# Patient Record
Sex: Female | Born: 1945
Health system: Southern US, Community
[De-identification: ages and names within clinical notes are randomized; demographics above are authoritative.]

## PROBLEM LIST (undated history)

## (undated) DIAGNOSIS — J449 Chronic obstructive pulmonary disease, unspecified: Secondary | ICD-10-CM

## (undated) DIAGNOSIS — I219 Acute myocardial infarction, unspecified: Secondary | ICD-10-CM

## (undated) DIAGNOSIS — E119 Type 2 diabetes mellitus without complications: Secondary | ICD-10-CM

## (undated) DIAGNOSIS — K219 Gastro-esophageal reflux disease without esophagitis: Secondary | ICD-10-CM

## (undated) DIAGNOSIS — E785 Hyperlipidemia, unspecified: Secondary | ICD-10-CM

## (undated) DIAGNOSIS — T7840XA Allergy, unspecified, initial encounter: Secondary | ICD-10-CM

## (undated) DIAGNOSIS — I509 Heart failure, unspecified: Secondary | ICD-10-CM

## (undated) DIAGNOSIS — F32A Depression, unspecified: Secondary | ICD-10-CM

## (undated) DIAGNOSIS — J45909 Unspecified asthma, uncomplicated: Secondary | ICD-10-CM

## (undated) DIAGNOSIS — I2510 Atherosclerotic heart disease of native coronary artery without angina pectoris: Secondary | ICD-10-CM

## (undated) DIAGNOSIS — I10 Essential (primary) hypertension: Secondary | ICD-10-CM

## (undated) DIAGNOSIS — F329 Major depressive disorder, single episode, unspecified: Secondary | ICD-10-CM

## (undated) HISTORY — DX: Allergy, unspecified, initial encounter: T78.40XA

## (undated) HISTORY — DX: Unspecified asthma, uncomplicated: J45.909

## (undated) HISTORY — DX: Gastro-esophageal reflux disease without esophagitis: K21.9

## (undated) HISTORY — DX: Atherosclerotic heart disease of native coronary artery without angina pectoris: I25.10

## (undated) HISTORY — DX: Depression, unspecified: F32.A

## (undated) HISTORY — DX: Major depressive disorder, single episode, unspecified: F32.9

---

## 2001-04-24 ENCOUNTER — Encounter: Payer: Self-pay | Admitting: Otolaryngology

## 2001-04-24 ENCOUNTER — Encounter: Admission: RE | Admit: 2001-04-24 | Discharge: 2001-04-24 | Payer: Self-pay | Admitting: Otolaryngology

## 2005-03-07 ENCOUNTER — Ambulatory Visit: Payer: Self-pay | Admitting: Pulmonary Disease

## 2005-09-22 ENCOUNTER — Ambulatory Visit: Payer: Self-pay | Admitting: Pulmonary Disease

## 2005-09-28 ENCOUNTER — Ambulatory Visit: Payer: Self-pay | Admitting: Pulmonary Disease

## 2007-01-18 ENCOUNTER — Ambulatory Visit: Payer: Self-pay | Admitting: Pulmonary Disease

## 2007-02-01 ENCOUNTER — Ambulatory Visit: Payer: Self-pay | Admitting: Pulmonary Disease

## 2007-02-01 LAB — CONVERTED CEMR LAB
ALT: 17 units/L (ref 0–35)
AST: 17 units/L (ref 0–37)
Albumin: 3.2 g/dL — ABNORMAL LOW (ref 3.5–5.2)
Alkaline Phosphatase: 80 units/L (ref 39–117)
BUN: 9 mg/dL (ref 6–23)
Basophils Absolute: 0.1 10*3/uL (ref 0.0–0.1)
Basophils Relative: 0.6 % (ref 0.0–1.0)
Bilirubin, Direct: 0.1 mg/dL (ref 0.0–0.3)
CO2: 33 meq/L — ABNORMAL HIGH (ref 19–32)
Calcium: 8.8 mg/dL (ref 8.4–10.5)
Chloride: 98 meq/L (ref 96–112)
Cholesterol: 211 mg/dL (ref 0–200)
Creatinine, Ser: 1 mg/dL (ref 0.4–1.2)
Direct LDL: 134.1 mg/dL
Eosinophils Absolute: 0.4 10*3/uL (ref 0.0–0.6)
Eosinophils Relative: 4.7 % (ref 0.0–5.0)
GFR calc Af Amer: 73 mL/min
GFR calc non Af Amer: 61 mL/min
Glucose, Bld: 118 mg/dL — ABNORMAL HIGH (ref 70–99)
HCT: 41.5 % (ref 36.0–46.0)
HDL: 28.7 mg/dL — ABNORMAL LOW (ref >39.0)
Hemoglobin: 14.5 g/dL (ref 12.0–15.0)
Lymphocytes Relative: 32.1 % (ref 12.0–46.0)
MCHC: 34.8 g/dL (ref 30.0–36.0)
MCV: 93 fL (ref 78.0–100.0)
Monocytes Absolute: 0.3 10*3/uL (ref 0.2–0.7)
Monocytes Relative: 4.1 % (ref 3.0–11.0)
Neutro Abs: 4.9 10*3/uL (ref 1.4–7.7)
Neutrophils Relative %: 58.5 % (ref 43.0–77.0)
Platelets: 248 10*3/uL (ref 150–400)
Potassium: 4 meq/L (ref 3.5–5.1)
RBC: 4.47 M/uL (ref 3.87–5.11)
RDW: 11.7 % (ref 11.5–14.6)
Sodium: 139 meq/L (ref 135–145)
TSH: 2.36 microintl units/mL (ref 0.35–5.50)
Total Bilirubin: 0.5 mg/dL (ref 0.3–1.2)
Total CHOL/HDL Ratio: 7.4
Total Protein: 6.4 g/dL (ref 6.0–8.3)
Triglycerides: 324 mg/dL (ref 0–149)
VLDL: 65 mg/dL — ABNORMAL HIGH (ref 0–40)
WBC: 8.4 10*3/uL (ref 4.5–10.5)

## 2007-03-23 ENCOUNTER — Ambulatory Visit: Payer: Self-pay | Admitting: Pulmonary Disease

## 2008-01-24 DIAGNOSIS — K219 Gastro-esophageal reflux disease without esophagitis: Secondary | ICD-10-CM | POA: Insufficient documentation

## 2008-01-24 DIAGNOSIS — J449 Chronic obstructive pulmonary disease, unspecified: Secondary | ICD-10-CM | POA: Insufficient documentation

## 2008-01-24 DIAGNOSIS — J309 Allergic rhinitis, unspecified: Secondary | ICD-10-CM

## 2008-01-24 DIAGNOSIS — E782 Mixed hyperlipidemia: Secondary | ICD-10-CM | POA: Insufficient documentation

## 2008-01-24 DIAGNOSIS — F419 Anxiety disorder, unspecified: Secondary | ICD-10-CM

## 2009-04-22 ENCOUNTER — Telehealth: Payer: Self-pay | Admitting: Pulmonary Disease

## 2009-05-19 ENCOUNTER — Telehealth (INDEPENDENT_AMBULATORY_CARE_PROVIDER_SITE_OTHER): Payer: Self-pay | Admitting: *Deleted

## 2009-05-26 ENCOUNTER — Ambulatory Visit: Payer: Self-pay | Admitting: Pulmonary Disease

## 2009-05-27 ENCOUNTER — Encounter: Payer: Self-pay | Admitting: Adult Health

## 2009-06-01 ENCOUNTER — Telehealth (INDEPENDENT_AMBULATORY_CARE_PROVIDER_SITE_OTHER): Payer: Self-pay | Admitting: *Deleted

## 2009-06-01 LAB — CONVERTED CEMR LAB
ALT: 27 units/L (ref 0–35)
AST: 25 units/L (ref 0–37)
Albumin: 4.3 g/dL (ref 3.5–5.2)
Alkaline Phosphatase: 75 units/L (ref 39–117)
Amylase: 75 units/L (ref 27–131)
BUN: 9 mg/dL (ref 6–23)
Basophils Absolute: 0.1 10*3/uL (ref 0.0–0.1)
Basophils Relative: 0.7 % (ref 0.0–3.0)
Bilirubin Urine: NEGATIVE
Bilirubin, Direct: 0.1 mg/dL (ref 0.0–0.3)
CO2: 30 meq/L (ref 19–32)
Calcium: 9.6 mg/dL (ref 8.4–10.5)
Chloride: 94 meq/L — ABNORMAL LOW (ref 96–112)
Cholesterol: 282 mg/dL — ABNORMAL HIGH (ref 0–200)
Creatinine, Ser: 1 mg/dL (ref 0.4–1.2)
Direct LDL: 178.8 mg/dL
Eosinophils Absolute: 0.1 10*3/uL (ref 0.0–0.7)
Eosinophils Relative: 1.6 % (ref 0.0–5.0)
GFR calc non Af Amer: 59.89 mL/min (ref >60)
Glucose, Bld: 110 mg/dL — ABNORMAL HIGH (ref 70–99)
HCT: 45.9 % (ref 36.0–46.0)
HDL: 34.2 mg/dL — ABNORMAL LOW (ref >39.00)
Hemoglobin: 16.3 g/dL — ABNORMAL HIGH (ref 12.0–15.0)
Ketones, ur: NEGATIVE mg/dL
Lipase: 8 units/L — ABNORMAL LOW (ref 11.0–59.0)
Lymphocytes Relative: 24 % (ref 12.0–46.0)
Lymphs Abs: 2.1 10*3/uL (ref 0.7–4.0)
MCHC: 35.4 g/dL (ref 30.0–36.0)
MCV: 96.4 fL (ref 78.0–100.0)
Monocytes Absolute: 0.3 10*3/uL (ref 0.1–1.0)
Monocytes Relative: 3.7 % (ref 3.0–12.0)
Neutro Abs: 6.3 10*3/uL (ref 1.4–7.7)
Neutrophils Relative %: 70 % (ref 43.0–77.0)
Nitrite: NEGATIVE
Platelets: 215 10*3/uL (ref 150.0–400.0)
Potassium: 4.5 meq/L (ref 3.5–5.1)
RBC: 4.77 M/uL (ref 3.87–5.11)
RDW: 12.3 % (ref 11.5–14.6)
Sodium: 137 meq/L (ref 135–145)
Specific Gravity, Urine: <=1.005 (ref 1.000–1.030)
TSH: 1.63 microintl units/mL (ref 0.35–5.50)
Total Bilirubin: 0.7 mg/dL (ref 0.3–1.2)
Total CHOL/HDL Ratio: 8
Total Protein, Urine: NEGATIVE mg/dL
Total Protein: 7.6 g/dL (ref 6.0–8.3)
Triglycerides: 471 mg/dL — ABNORMAL HIGH (ref 0.0–149.0)
Urine Glucose: NEGATIVE mg/dL
Urobilinogen, UA: 0.2 (ref 0.0–1.0)
VLDL: 94.2 mg/dL — ABNORMAL HIGH (ref 0.0–40.0)
WBC: 8.9 10*3/uL (ref 4.5–10.5)
pH: 6 (ref 5.0–8.0)

## 2009-06-09 ENCOUNTER — Encounter: Payer: Self-pay | Admitting: Adult Health

## 2009-06-19 ENCOUNTER — Telehealth (INDEPENDENT_AMBULATORY_CARE_PROVIDER_SITE_OTHER): Payer: Self-pay | Admitting: *Deleted

## 2009-12-07 ENCOUNTER — Telehealth: Payer: Self-pay | Admitting: Adult Health

## 2009-12-08 ENCOUNTER — Encounter: Payer: Self-pay | Admitting: Pulmonary Disease

## 2009-12-10 ENCOUNTER — Ambulatory Visit: Payer: Self-pay | Admitting: Pulmonary Disease

## 2009-12-10 ENCOUNTER — Encounter (INDEPENDENT_AMBULATORY_CARE_PROVIDER_SITE_OTHER): Payer: Self-pay | Admitting: *Deleted

## 2009-12-10 DIAGNOSIS — M1990 Unspecified osteoarthritis, unspecified site: Secondary | ICD-10-CM | POA: Insufficient documentation

## 2009-12-10 DIAGNOSIS — I10 Essential (primary) hypertension: Secondary | ICD-10-CM | POA: Insufficient documentation

## 2009-12-10 DIAGNOSIS — F17200 Nicotine dependence, unspecified, uncomplicated: Secondary | ICD-10-CM | POA: Insufficient documentation

## 2009-12-10 DIAGNOSIS — R079 Chest pain, unspecified: Secondary | ICD-10-CM | POA: Insufficient documentation

## 2009-12-10 DIAGNOSIS — R490 Dysphonia: Secondary | ICD-10-CM

## 2009-12-10 DIAGNOSIS — E663 Overweight: Secondary | ICD-10-CM | POA: Insufficient documentation

## 2009-12-11 ENCOUNTER — Ambulatory Visit: Payer: Self-pay | Admitting: Pulmonary Disease

## 2009-12-12 LAB — CONVERTED CEMR LAB
ALT: 23 units/L (ref 0–35)
AST: 21 units/L (ref 0–37)
Albumin: 3.8 g/dL (ref 3.5–5.2)
Alkaline Phosphatase: 75 units/L (ref 39–117)
BUN: 12 mg/dL (ref 6–23)
Basophils Absolute: 0 10*3/uL (ref 0.0–0.1)
Basophils Relative: 0.4 % (ref 0.0–3.0)
Bilirubin, Direct: 0.1 mg/dL (ref 0.0–0.3)
CO2: 35 meq/L — ABNORMAL HIGH (ref 19–32)
Calcium: 9.2 mg/dL (ref 8.4–10.5)
Chloride: 99 meq/L (ref 96–112)
Cholesterol: 255 mg/dL — ABNORMAL HIGH (ref 0–200)
Creatinine, Ser: 0.8 mg/dL (ref 0.4–1.2)
Direct LDL: 157.2 mg/dL
Eosinophils Absolute: 0.3 10*3/uL (ref 0.0–0.7)
Eosinophils Relative: 3.1 % (ref 0.0–5.0)
GFR calc non Af Amer: 78.47 mL/min (ref >60)
Glucose, Bld: 118 mg/dL — ABNORMAL HIGH (ref 70–99)
HCT: 44.2 % (ref 36.0–46.0)
HDL: 36.1 mg/dL — ABNORMAL LOW (ref >39.00)
Hemoglobin: 15.4 g/dL — ABNORMAL HIGH (ref 12.0–15.0)
Hgb A1c MFr Bld: 6.3 % (ref 4.6–6.5)
Lymphocytes Relative: 30.8 % (ref 12.0–46.0)
Lymphs Abs: 2.8 10*3/uL (ref 0.7–4.0)
MCHC: 34.8 g/dL (ref 30.0–36.0)
MCV: 94.7 fL (ref 78.0–100.0)
Monocytes Absolute: 0.5 10*3/uL (ref 0.1–1.0)
Monocytes Relative: 5.2 % (ref 3.0–12.0)
Neutro Abs: 5.4 10*3/uL (ref 1.4–7.7)
Neutrophils Relative %: 60.5 % (ref 43.0–77.0)
Platelets: 233 10*3/uL (ref 150.0–400.0)
Potassium: 4.8 meq/L (ref 3.5–5.1)
RBC: 4.67 M/uL (ref 3.87–5.11)
RDW: 13 % (ref 11.5–14.6)
Sodium: 142 meq/L (ref 135–145)
TSH: 1.37 microintl units/mL (ref 0.35–5.50)
Total Bilirubin: 0.5 mg/dL (ref 0.3–1.2)
Total CHOL/HDL Ratio: 7
Total Protein: 6.7 g/dL (ref 6.0–8.3)
Triglycerides: 501 mg/dL — ABNORMAL HIGH (ref 0.0–149.0)
VLDL: 100.2 mg/dL — ABNORMAL HIGH (ref 0.0–40.0)
WBC: 9 10*3/uL (ref 4.5–10.5)

## 2009-12-16 LAB — CONVERTED CEMR LAB: Vit D, 25-Hydroxy: 39 ng/mL (ref 30–89)

## 2009-12-18 ENCOUNTER — Telehealth: Payer: Self-pay | Admitting: Pulmonary Disease

## 2010-08-24 NOTE — Medication Information (Signed)
Summary: Tax adviser   Imported By: Valinda Hoar 12/08/2009 16:08:42  _____________________________________________________________________  External Attachment:    Type:   Image     Comment:   External Document

## 2010-08-24 NOTE — Assessment & Plan Note (Signed)
Summary: rov/meds/apc   CC:  33 month ROV & review of mult medical problems....  History of Present Illness: 65 y/o WF her for a follow up visit... she has multiple medical problems as noted below...  she was last seen by me 8/08 for f/u of her COPD, chronic hoarseness, continued smoking, GERD, and Hyperlipidemia> see problem list below... unfortunately, nothing much has changed as she continues to smoke & has not been taking good care of herself... we reviewed her problem list and risk factors>>   Current Problems:   ALLERGIC RHINITIS (ICD-477.9) - on XYZAL 5mg /d & NASONEX 2sp Bid... she saw DrESL in 2002 for allergy eval w/ reactions to grass, dust, mites, & molds... she tried immunotherapy for a while...  HOARSENESS, CHRONIC (ICD-784.42) - she works at [employer] sees [employer] (prev DrJacobs)> we don't have any recent notes but she indicates the Dx is LER w/ Chr Hoarseness & Dysphonia... I have asked DrBates to do a formal ENT consult on her & keep [place] informed of her status...  COPD (ICD-496) - she is a chr smoker, now still at 1/2 ppd... evid of severe airflow obstruction on prev PFT's and CXR's clear, NAD... she's been on ADVAIR 250Bid, MUCINEX MAX Bid Prn, and occas antibiotics for exac...   ~  PFT 10/02 showed FVC= 1.78 (64%), FEV1= 1.36 (59%), %1sec=76, mid-flows=39%pred.  ~  CXR 6/08 showed clear lungs, NAD...  ~  PFT 6/08 showed FVC= 1.71(61%), FEV1= 0.93 (40%), %1sec=54, mid-flows=17% pred.  ~  CXR 5/11 showed coarse interstitial markings at the bases, NAD...  OTHER PULMONARY INSUFFICIENCY NEC (ICD-518.82) - PFT showed severe airflow obstruction, and O2 sat on room air at rest today = 88%... she does not want home oxygen.  CIGARETTE SMOKER (ICD-305.1) - long smoking hx and still smokes 1/2 ppd> can't vs won't quit & not motivated due to personal stress etc... she declines smoking cessation help w/ Chantix etc...  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 she only took it for 1 month then stopped on her own and no-showed for her follow up appts... she was advised to get yearly CXR's> last CXR here 6/08 was clear... f/u film 5/11 showed coarse interstitial markings at the bases but NAD...  HYPERTENSION (ICD-401.9) - controlled on TOPROL XL 50mg /d & DIOVAN/ Hct 160-12.5 daily... BP= 140/80 today & similar at home she says... tolerates meds well- denies HA, visual changes, CP, palipit, dizziness, syncope, edema, etc...   CHEST PAIN UNSPECIFIED (ICD-786.50) - she notes some intermittent epigastric & lower CP... mult coronary risk factors...   ~  baseline EKG showed NSR, WNL...  ~  EKG 5/11 showed NSR, WNL...  HYPERLIPIDEMIA (ICD-272.4) - on VYTORIN 10-40 daily, not really on low chol/ low fat diet...  ~  FLP 7/08 showed TChol 211, TG 324, HDL 29, LDL 134  ~  FLP 5/11 showed TChol 255, TG 501, HDL 36, LDL 157... rec ch Vytorin to Crestor20...  OVERWEIGHT (ICD-278.02) - we discussed diet + exercise therapy & the need to lose 10-15 lbs...  ~  weight 8/08 = 153#  ~  weight 5/11 = 153#,  62" tall,  BMI= 28...  GERD (ICD-530.81) - she has mod GERD symptoms and severe chronic hoarseness w/ hx LER... denies dysphagia etc... on DEXILANT 60mg  taken 30 min before the 1st meal of the day, and PEPCID 40mg  Qhs... discussed antireflux regimen, elev HOB,  etc...  DEGENERATIVE JOINT DISEASE (ICD-715.90) - she has diffuse aches & pains... "I use herbs"  ANXIETY (ICD-300.00) - notes that she is under alot of stress> husb ill, brother died, son w/ ADD... she is requesting an anxiolytic & we will perscribe ALPRAZOLAM 0.5mg  Tid for nerves & to aide smoking cessation...   Allergies: No Known Drug Allergies  Past History:  Past Medical History: ALLERGIC RHINITIS (ICD-477.9) HOARSENESS, CHRONIC (ICD-784.42) COPD (ICD-496) OTHER PULMONARY INSUFFICIENCY NEC (ICD-518.82) CIGARETTE SMOKER (ICD-305.1) POSITIVE PPD  (ICD-795.5) HYPERTENSION (ICD-401.9) CHEST PAIN UNSPECIFIED (ICD-786.50) HYPERLIPIDEMIA (ICD-272.4) OVERWEIGHT (ICD-278.02) GERD (ICD-530.81) DEGENERATIVE JOINT DISEASE (ICD-715.90) ANXIETY (ICD-300.00)  Family History: Reviewed history from 05/26/2009 and no changes required. allergies - mother heart disease - father rheumatism - mother cancer - mother (breast) stroke - father  Social History: Reviewed history from 05/26/2009 and no changes required. currently smoking 1ppd, x20yrs rare alcohol 2 cups coffee a day married 2 children, 1 biological works at [employer] in [employer].  Review of Systems       The patient complains of hoarseness, dyspnea on exertion, cough, gas/bloating, joint pain, stiffness, anxiety, and hay fever.  The patient denies fever, chills, sweats, anorexia, fatigue, weakness, malaise, weight loss, sleep disorder, blurring, diplopia, eye irritation, eye discharge, vision loss, eye pain, photophobia, earache, ear discharge, tinnitus, decreased hearing, nasal congestion, nosebleeds, sore throat, chest pain, palpitations, syncope, orthopnea, PND, peripheral edema, dyspnea at rest, excessive sputum, hemoptysis, wheezing, pleurisy, nausea, vomiting, diarrhea, constipation, change in bowel habits, abdominal pain, melena, hematochezia, jaundice, indigestion/heartburn, dysphagia, odynophagia, dysuria, hematuria, urinary frequency, urinary hesitancy, nocturia, incontinence, back pain, joint swelling, muscle cramps, muscle weakness, arthritis, sciatica, restless legs, leg pain at night, leg pain with exertion, rash, itching, dryness, suspicious lesions, paralysis, paresthesias, seizures, tremors, vertigo, transient blindness, frequent falls, frequent headaches, difficulty walking, depression, memory loss, confusion, cold intolerance, heat intolerance, polydipsia, polyphagia, polyuria, unusual weight change, abnormal bruising, bleeding, enlarged lymph nodes,  urticaria, allergic rash, and recurrent infections.    Vital Signs:  Patient profile:   65 year old female Height:      62 inches Weight:      153 pounds BMI:     28.09 O2 Sat:      88 % on Room air Temp:     97.6 degrees F oral Pulse rate:   81 / minute BP sitting:   140 / 80  (left arm)  Vitals Entered By: Renold Genta RCP, LPN (Dec 10, 2009 10:14 AM)  O2 Sat at Rest %:  88% O2 Flow:  Room air CC: 33 month ROV & review of mult medical problems... Comments Medications reviewed with patient Renold Genta RCP, LPN  Dec 10, 2009 10:17 AM    Physical Exam  Additional Exam:  WD, sl overweight, 65 y/o WF in NAD... GENERAL:  Alert & oriented; pleasant & cooperative... HEENT:  [place]/AT, EOM-wnl, PERRLA, Fundi-benign, EACs-clear, TMs-wnl, NOSE-clear, THROAT-clear & wnl. NECK:  Supple w/ full ROM; no JVD; normal carotid impulses w/o bruits; no thyromegaly or nodules palpated; no lymphadenopathy. CHEST:  Decreased BS, few exp rhonchi, no wheezes or rales... HEART:  Regular Rhythm; without murmurs/ rubs/ or gallops. ABDOMEN:  Soft & nontender; normal bowel sounds; no organomegaly or masses detected. EXT: without deformities, mild arthritic changes; no varicose veins/ venous insuffic/ or edema. NEURO:  CN's intact; motor testing normal; sensory testing normal; gait normal & balance OK. DERM:  No lesions noted; no rash etc...    CXR  Procedure date:  12/11/2009  Findings:  CHEST - 2 VIEW Comparison: 01/18/2007   Findings: Two views of the chest were obtained.  There are coarse interstitial markings in the lower lungs.  This finding is similar to the previous examination.  There is no focal airspace disease or pulmonary edema.  No significant hyperinflation of the lungs. Heart and mediastinum are stable.  Bony structures are intact.   IMPRESSION: No acute chest findings.  Suspect some chronic changes in the lower lungs.   Read By:  Abundio Miu,  M.D.     EKG  Procedure date:  12/10/2009  Findings:      Normal sinus rhythm with rate of:  74/ min... Tracing is WNL, NAD...  SN   MISC. Report  Procedure date:  12/11/2009  Findings:      Lipid Panel (LIPID)   Cholesterol          [H]  255 mg/dL                   1-610   Triglycerides        [H]  501.0 mg/dL                 9.6-045.4   HDL                  [L]  09.81 mg/dL                 >19.14 Cholesterol LDL - Direct                             157.2 mg/dL  BMP (METABOL)   Sodium                    142 mEq/L                   135-145   Potassium                 4.8 mEq/L                   3.5-5.1   Chloride                  99 mEq/L                    96-112   Carbon Dioxide       [H]  35 mEq/L                    19-32   Glucose              [H]  118 mg/dL                   78-29   BUN                       12 mg/dL                    5-62   Creatinine                0.8 mg/dL                   1.3-0.8   Calcium                   9.2 mg/dL  8.4-10.5   GFR                       78.47 mL/min                >60  Hepatic/Liver Function Panel (HEPATIC)   Total Bilirubin           0.5 mg/dL                   1.6-1.0   Direct Bilirubin          0.1 mg/dL                   9.6-0.4   Alkaline Phosphatase      75 U/L                      39-117   AST                       21 U/L                      0-37   ALT                       23 U/L                      0-35   Total Protein             6.7 g/dL                    5.4-0.9   Albumin                   3.8 g/dL                    8.1-1.9  Comments:      CBC Platelet w/Diff (CBCD)   White Cell Count          9.0 K/uL                    4.5-10.5   Red Cell Count            4.67 Mil/uL                 3.87-5.11   Hemoglobin           [H]  15.4 g/dL                   14.7-82.9   Hematocrit                44.2 %                      36.0-46.0   MCV                       94.7 fl                     78.0-100.0    Platelet Count            233.0 K/uL                  150.0-400.0   Neutrophil %              60.5 %  43.0-77.0   Lymphocyte %              30.8 %                      12.0-46.0   Monocyte %                5.2 %                       3.0-12.0   Eosinophils%              3.1 %                       0.0-5.0   Basophils %               0.4 %                       0.0-3.0  TSH (TSH)   FastTSH                   1.37 uIU/mL                 0.35-5.50  Hemoglobin A1C (A1C)   Hemoglobin A1C            6.3 %                       4.6-6.5  Vitamin D (25-Hydroxy) (08657)  Vitamin D (25-Hydroxy)                             39 ng/mL                    30-89   Impression & Recommendations:  Problem # 1:  HOARSENESS, CHRONIC (QIO-962.95) She needs f/u w/ ENT DrBates and GI for LER eval... Orders: Gastroenterology Referral (GI)  Problem # 2:  OTHER PULMONARY INSUFFICIENCY NEC (MWU-132.44) She has COPD, AB, Chr resp insuffic... CXR w/ chr changes, NAD... must quit all smoking. Orders: T-2 View CXR (71020TC)  Problem # 3:  CIGARETTE SMOKER (ICD-305.1) Discussed smoking cessation> she isn't motivated to quit, tried the ecigarette, refuses Cahntix, etc...  Problem # 4:  HYPERTENSION (ICD-401.9) Controlled on meds... continue same. Her updated medication list for this problem includes:    Bystolic 10 Mg Tabs (Nebivolol hcl) .[name]... Take 1 tab by mouth once daily...    Diovan Hct 160-12.5 Mg Tabs (Valsartan-hydrochlorothiazide) .[name]... Take 1 tablet by mouth once a day  Problem # 5:  CHEST PAIN UNSPECIFIED (ICD-786.50) EKG is WNL.Marland KitchenMarland Kitchen Orders: 12 Lead EKG (12 Lead EKG) T-2 View CXR (71020TC)  Problem # 6:  OVERWEIGHT (ICD-278.02) We discussed diet + exercise...  Problem # 7:  HYPERLIPIDEMIA (ICD-272.4) FLP is markedly deranged... Vytorin not working... REC> ch to Crestor20 + Fenofibrate160... ROV 3 mo... Her updated medication list for this problem includes:    Vytorin 10-40  Mg Tabs (Ezetimibe-simvastatin) .[name]... Take one tablet by mouth at bedtime  Problem # 8:  GERD (ICD-530.81) We discussed antireflux regimen w/ Dexilant Qd & Pepcid Qhs... elev HOB, etc... needs GI eval w/ EGD & Colon... Her updated medication list for this problem includes:    Dexilant 60 Mg Cpdr (Dexlansoprazole) .[name]... Take 1 capsule by mouth daily- 30 min before the 1st meal of the day...    Pepcid 40 Mg  Tabs (Famotidine) .[name]... Take 1 tab by mouth at bedtime...  Orders: Gastroenterology Referral (GI)  Problem # 9:  OTHER MEDICAL PROBLEMS AS NOTED>>>  Complete Medication List: 1)  Nasonex 50 Mcg/act Susp (Mometasone furoate) .... 2 puffs two times a day 2)  Xyzal 5 Mg Tabs (Levocetirizine dihydrochloride) .... Take 1 tablet by mouth once a day 3)  Advair Diskus 250-50 Mcg/dose Misc (Fluticasone-salmeterol) .... Take one puff two times a day    rinse after each use 4)  Mucinex Maximum Strength 1200 Mg Xr12h-tab (Guaifenesin) .... Take 1 tab by mouth two times a day w/ fluids.Marland KitchenMarland Kitchen 5)  Delsym 30 Mg/41ml Lqcr (Dextromethorphan polistirex) .... As needed for cough 6)  Bystolic 10 Mg Tabs (Nebivolol hcl) .... Take 1 tab by mouth once daily.Marland KitchenMarland Kitchen 7)  Diovan Hct 160-12.5 Mg Tabs (Valsartan-hydrochlorothiazide) .... Take 1 tablet by mouth once a day 8)  Vytorin 10-40 Mg Tabs (Ezetimibe-simvastatin) .... Take one tablet by mouth at bedtime 9)  Dexilant 60 Mg Cpdr (Dexlansoprazole) .... Take 1 capsule by mouth daily- 30 min before the 1st meal of the day... 10)  Pepcid 40 Mg Tabs (Famotidine) .... Take 1 tab by mouth at bedtime... 11)  Alprazolam 0.5 Mg Tabs (Alprazolam) .... Take 1 tab by mouth three times a day as needed for nerves...  Patient Instructions: 1)  Today we updated your med list- see below.... 2)  We refilled your meds as we discussed... 3)  Today we did your follow up CXR & EKG... please return to our lab in the AM for your FASTING blood work...  4)  Then please call the "phone tree" in  a few days for your lab results.Marland KitchenMarland Kitchen 5)  We will arrange for a GI eval w/ one of our gastroenterologists.Marland KitchenMarland Kitchen 6)  Please check w/ DrBates about getting your vocal cords and larynx checked in his office... 7)  You must quuit the smoking- IT IS KILLING YOU!!!  We wrote a perscription for Alprazolam to take for nerves;  let me know if you want the Chantix to aide in smoking cessation.Marland KitchenMarland Kitchen 8)  Call for any questions.Marland KitchenMarland Kitchen 9)  Please schedule a follow-up appointment in 3-4 months. Prescriptions: ALPRAZOLAM 0.5 MG TABS (ALPRAZOLAM) take 1 tab by mouth three times a day as needed for nerves...  #90 x 6   Entered and Authorized by:   Michele Mcalpine MD   Signed by:   Michele Mcalpine MD on 12/10/2009   Method used:   Print then Give to Patient   RxID:   [id number] PEPCID 40 MG TABS (FAMOTIDINE) take 1 tab by mouth at bedtime...  #30 x 6   Entered and Authorized by:   Michele Mcalpine MD   Signed by:   Michele Mcalpine MD on 12/10/2009   Method used:   Print then Give to Patient   RxID:   [id number] DEXILANT 60 MG CPDR (DEXLANSOPRAZOLE) Take 1 capsule by mouth daily- 30 min before the 1st meal of the day...  #30 x 6   Entered and Authorized by:   Michele Mcalpine MD   Signed by:   Michele Mcalpine MD on 12/10/2009   Method used:   Print then Give to Patient   RxID:   [id number] VYTORIN 10-40 MG  TABS (EZETIMIBE-SIMVASTATIN) take one tablet by mouth at bedtime  #30 x 6   Entered and Authorized by:   Michele Mcalpine MD   Signed by:   Michele Mcalpine MD on 12/10/2009  Method used:   Print then Give to Patient   RxID:   [id number] DIOVAN HCT 160-12.5 MG  TABS (VALSARTAN-HYDROCHLOROTHIAZIDE) Take 1 tablet by mouth once a day  #30 x 6   Entered and Authorized by:   Michele Mcalpine MD   Signed by:   Michele Mcalpine MD on 12/10/2009   Method used:   Print then Give to Patient   RxID:   [id number] BYSTOLIC 10 MG TABS (NEBIVOLOL HCL) take 1 tab by mouth once daily...  #30 x 6   Entered and Authorized by:    Michele Mcalpine MD   Signed by:   Michele Mcalpine MD on 12/10/2009   Method used:   Print then Give to Patient   RxID:   [id number] ADVAIR DISKUS 250-50 MCG/DOSE  MISC (FLUTICASONE-SALMETEROL) take one puff two times a day    rinse after each use  #1 x 6   Entered and Authorized by:   Michele Mcalpine MD   Signed by:   Michele Mcalpine MD on 12/10/2009   Method used:   Print then Give to Patient   RxID:   [id number] XYZAL 5 MG TABS (LEVOCETIRIZINE DIHYDROCHLORIDE) Take 1 tablet by mouth once a day  #30 x 6   Entered and Authorized by:   Michele Mcalpine MD   Signed by:   Michele Mcalpine MD on 12/10/2009   Method used:   Print then Give to Patient   RxID:   [id number] NASONEX 50 MCG/ACT SUSP (MOMETASONE FUROATE) 2 puffs two times a day  #1 x 6   Entered and Authorized by:   Michele Mcalpine MD   Signed by:   Michele Mcalpine MD on 12/10/2009   Method used:   Print then Give to Patient   RxID:   [id number]    CardioPerfect ECG  ID: [id number] Patient: Leonie Green DOB: November 05, 1945 Age: 65 Years Old Sex: Female Race: White Physician: Kriste Basque Technician: Renold Genta RCP, LPN Height: 62 Weight: 153 Status: Unconfirmed Past Medical History:   COPD (ICD-496)--smoker FEV1 0.93 /40% 6/08  --tx w/ Advair 250/50 two times a day however pt does not take regularly. prev. tried chantix w/o help (08).   GERD (ICD-530.81)--seen previously by Dr. Christella Hartigan , rx Nexium , now on dexliant.   ANXIETY (ICD-300.00)--intermittent  HYPERLIPIDEMIA (ICD-272.4)--on Vytorin 10/40mg  (altara added 7/08, did not fill). 7/08 TC 211, TG 324, HDL 28, LDL 134   Hx of positive PPD (2000)- intolerant to INH   Recorded: 12/10/2009 11:16 AM P/PR: 112 ms / 153 ms - Heart rate (maximum exercise) QRS: 82 QT/QTc/QTd: 390 ms / 414 ms / 47 ms - Heart rate (maximum exercise)  P/QRS/T axis: 59 deg / 1 deg / 56 deg - Heart rate (maximum exercise)  Heartrate: 74 bpm  Interpretation:  Normal sinus rhythm  with rate of:  74/ min... Tracing is WNL, NAD...  SN

## 2010-08-24 NOTE — Progress Notes (Signed)
Summary: dexilant PA  Phone Note From Pharmacy   Caller: [employer] - Prior Auth Dept Call For: Cloyde Oregel  Summary of Call: Received prior auth form from pt's phamracy for Dexilant.  Prior Auth is handeld by [employer] [phone]).  Form received and placed in Umar Patmon's look at. Initial call taken by: Abigail Miyamoto RN,  Dec 07, 2009 11:03 AM  Follow-up for Phone Call        form filled out and signed by TP, faxed to [facility] 12-08-09.  will await response from insurance. Boone Master CNA  Dec 08, 2009 9:08 AM   PA for dexilant approved thru 09/02/12. pharmacy and pt aware. Carron Curie CMA  Dec 08, 2009 11:46 AM

## 2010-08-24 NOTE — Letter (Signed)
Summary: New Patient letter  [facility]  [address] [place], [place] [address]   Phone: [phone]  Fax: [phone]       12/10/2009 MRN: [id number]  RASHAE ROTHER [address] [place], [place]  [address]  Dear Ms. Augustine Radar,  Welcome to the Gastroenterology Division at [facility].    You are scheduled to see Dr.  Christella Hartigan on  01-19-10 at 8:30am on the 3rd floor at [facility], [address].  We ask that you try to arrive at our office 15 minutes prior to your appointment time to allow for check-in.  We would like you to complete the enclosed self-administered evaluation form prior to your visit and bring it with you on the day of your appointment.  We will review it with you.  Also, please bring a complete list of all your medications or, if you prefer, bring the medication bottles and we will list them.  Please bring your insurance card so that we may make a copy of it.  If your insurance requires a referral to see a specialist, please bring your referral form from your primary care physician.  Co-payments are due at the time of your visit and may be paid by cash, check or credit card.     Your office visit will consist of a consult with your physician (includes a physical exam), any laboratory testing he/she may order, scheduling of any necessary diagnostic testing (e.g. x-ray, ultrasound, CT-scan), and scheduling of a procedure (e.g. Endoscopy, Colonoscopy) if required.  Please allow enough time on your schedule to allow for any/all of these possibilities.    If you cannot keep your appointment, please call [phone] to cancel or reschedule prior to your appointment date.  This allows [place] the opportunity to schedule an appointment for another patient in need of care.  If you do not cancel or reschedule by 5 p.m. the business day prior to your appointment date, you will be charged a $50.00 late cancellation/no-show fee.    Thank you for choosing  [facility] for your medical needs.  We appreciate the opportunity to care for you.  Please visit [place] at our website  to learn more about our practice.                     Sincerely,                                                             The Gastroenterology Division

## 2010-08-24 NOTE — Progress Notes (Signed)
Summary: call before 12  Phone Note Call from Patient Call back at [phone]   Caller: Patient Call For: Beverly Wheeler Reason for Call: Talk to Nurse Summary of Call: using her Advair, rinsing, but has a yeast infection in her mouth.  Mouth tender and white.  Do you want her to continue Advair?  Can you call something in?  Call at number given until 12, afterwards call her @ home. [facility] Initial call taken by: Eugene Gavia,  Dec 18, 2009 9:11 AM  Follow-up for Phone Call        Pt c/o having a white coating on tongue and sore starting yesterday. Pt states has been using Advair x 1 week with some improvement in breathing. Has been rinsing mouth with cold water after using Advair. Pt wants to know if she just needs something called in or does she need to d/c Advair. Please advise. Zackery Barefoot CMA  Dec 18, 2009 9:23 AM   Additional Follow-up for Phone Call Additional follow up Details #1::        per SN---ok for pt to have diflucan 100mg   #8  2 by mouth today then one daily until gone  and change advair to symbicort 80  2 sprays two times a day and rinse/brush teeth after each use.  thanks Randell Loop CMA  Dec 18, 2009 11:17 AM     Additional Follow-up for Phone Call Additional follow up Details #2::    pt advised.rx sent.  Carron Curie CMA  Dec 18, 2009 11:39 AM   New/Updated Medications: SYMBICORT 80-4.5 MCG/ACT AERO (BUDESONIDE-FORMOTEROL FUMARATE) 2 puffs twice daily DIFLUCAN 100 MG TABS (FLUCONAZOLE) 2 tablets by mouth today, then one daily until gone  Prescriptions: DIFLUCAN 100 MG TABS (FLUCONAZOLE) 2 tablets by mouth today, then one daily until gone  #8 x 0   Entered by:   Carron Curie CMA   Authorized by:   Michele Mcalpine MD   Signed by:   Carron Curie CMA on 12/18/2009   Method used:   Electronically to        [facility]  [phone]* (retail)       [address] [place], [place]  [address]       Ph: [phone] or [phone]  Fax: [phone]   RxID:   [id number] SYMBICORT 80-4.5 MCG/ACT AERO (BUDESONIDE-FORMOTEROL FUMARATE) 2 puffs twice daily  #1 x 3   Entered by:   Carron Curie CMA   Authorized by:   Michele Mcalpine MD   Signed by:   Carron Curie CMA on 12/18/2009   Method used:   Electronically to        [facility]  [phone]* (retail)       [address] [place], [place]  [address]       Ph: [phone] or [phone]       Fax: [phone]   RxID:   [id number]

## 2011-01-04 ENCOUNTER — Other Ambulatory Visit: Payer: Self-pay | Admitting: Pulmonary Disease

## 2011-05-19 ENCOUNTER — Other Ambulatory Visit: Payer: Self-pay | Admitting: Pulmonary Disease

## 2011-06-27 ENCOUNTER — Other Ambulatory Visit: Payer: Self-pay | Admitting: Pulmonary Disease

## 2011-06-28 ENCOUNTER — Telehealth: Payer: Self-pay | Admitting: Pulmonary Disease

## 2011-06-28 MED ORDER — NEBIVOLOL HCL 10 MG PO TABS: 10.0000 mg | ORAL_TABLET | Freq: Every day | ORAL | Status: DC

## 2011-06-28 MED ORDER — VALSARTAN-HYDROCHLOROTHIAZIDE 160-12.5 MG PO TABS: 1.0000 | ORAL_TABLET | Freq: Every day | ORAL | Status: DC

## 2011-06-28 NOTE — Telephone Encounter (Signed)
Pt informed that refills were sent to pharmacy.  Pt has appt with Dr Kriste Basque on 07-29-11.

## 2011-07-29 ENCOUNTER — Ambulatory Visit: Payer: Self-pay | Admitting: Pulmonary Disease

## 2011-10-11 ENCOUNTER — Other Ambulatory Visit: Payer: Self-pay | Admitting: Pulmonary Disease

## 2012-01-31 ENCOUNTER — Telehealth: Payer: Self-pay | Admitting: Pulmonary Disease

## 2012-01-31 MED ORDER — NEBIVOLOL HCL 10 MG PO TABS: 10.0000 mg | ORAL_TABLET | Freq: Every day | ORAL | Status: DC

## 2012-01-31 MED ORDER — VALSARTAN-HYDROCHLOROTHIAZIDE 160-12.5 MG PO TABS: 1.0000 | ORAL_TABLET | Freq: Every day | ORAL | Status: DC

## 2012-01-31 NOTE — Telephone Encounter (Signed)
I spoke with pt and i advised her will send refill this time but she needs to keep her OV for further refills. RX's have been sent

## 2012-03-07 ENCOUNTER — Ambulatory Visit: Payer: Self-pay | Admitting: Pulmonary Disease

## 2012-06-14 ENCOUNTER — Telehealth: Payer: Self-pay | Admitting: Pulmonary Disease

## 2012-06-14 DIAGNOSIS — W19XXXA Unspecified fall, initial encounter: Secondary | ICD-10-CM

## 2012-06-14 MED ORDER — VALSARTAN-HYDROCHLOROTHIAZIDE 160-12.5 MG PO TABS: 1.0000 | ORAL_TABLET | Freq: Every day | ORAL | Status: DC

## 2012-06-14 MED ORDER — NEBIVOLOL HCL 10 MG PO TABS: 10.0000 mg | ORAL_TABLET | Freq: Every day | ORAL | Status: DC

## 2012-06-14 NOTE — Telephone Encounter (Signed)
Called and spoke with pt and she is aware that the 2 meds have been sent to the pharmacy and pt will need to keep her appt with SN in jan.  Nothing further is needed.

## 2012-06-14 NOTE — Telephone Encounter (Signed)
Per SN---pt will need ortho eval to examine and xray.   Please set up appt with ortho for the pt.  Pt last seen in the office 12/10/2009.  Or she may go to the ER for eval.

## 2012-06-14 NOTE — Telephone Encounter (Signed)
I spoke with pt and she stated she fell last night over her dogs bed and hit her R arm on the heater. She stated it is hurting and can't raise her arm very high. She is requesting to have this xray 'd. Please advise thanks  Last ov 12/10/2009 No pending OV Cancelled OV 03/07/12 and 07/29/11

## 2012-06-14 NOTE — Telephone Encounter (Signed)
I spoke with pt and she stated she did not want to go to the ED. She stated she would like a referral to ortho. I have placed referral. Pt was transferred upfront to schedule an OV w/ SN

## 2012-07-26 ENCOUNTER — Ambulatory Visit: Payer: Self-pay | Admitting: Pulmonary Disease

## 2012-10-15 ENCOUNTER — Ambulatory Visit: Payer: Self-pay | Admitting: Pulmonary Disease

## 2012-12-05 ENCOUNTER — Ambulatory Visit: Payer: Self-pay | Admitting: Pulmonary Disease

## 2012-12-11 ENCOUNTER — Encounter: Payer: Self-pay | Admitting: Pulmonary Disease

## 2013-10-16 ENCOUNTER — Emergency Department (HOSPITAL_COMMUNITY): Payer: Medicare Other

## 2013-10-16 ENCOUNTER — Encounter (HOSPITAL_COMMUNITY): Payer: Self-pay | Admitting: Emergency Medicine

## 2013-10-16 ENCOUNTER — Inpatient Hospital Stay (HOSPITAL_COMMUNITY)
Admission: EM | Admit: 2013-10-16 | Discharge: 2013-10-21 | DRG: 282 | Disposition: A | Discharge status: Home / Self Care | Payer: Medicare Other | Attending: Cardiovascular Disease | Admitting: Cardiovascular Disease

## 2013-10-16 DIAGNOSIS — I10 Essential (primary) hypertension: Secondary | ICD-10-CM

## 2013-10-16 DIAGNOSIS — I2510 Atherosclerotic heart disease of native coronary artery without angina pectoris: Secondary | ICD-10-CM

## 2013-10-16 DIAGNOSIS — J449 Chronic obstructive pulmonary disease, unspecified: Secondary | ICD-10-CM

## 2013-10-16 DIAGNOSIS — F17200 Nicotine dependence, unspecified, uncomplicated: Secondary | ICD-10-CM

## 2013-10-16 DIAGNOSIS — Z01818 Encounter for other preprocedural examination: Secondary | ICD-10-CM

## 2013-10-16 DIAGNOSIS — E785 Hyperlipidemia, unspecified: Secondary | ICD-10-CM

## 2013-10-16 DIAGNOSIS — J4489 Other specified chronic obstructive pulmonary disease: Secondary | ICD-10-CM

## 2013-10-16 DIAGNOSIS — Z720 Tobacco use: Secondary | ICD-10-CM

## 2013-10-16 DIAGNOSIS — E782 Mixed hyperlipidemia: Secondary | ICD-10-CM

## 2013-10-16 DIAGNOSIS — K219 Gastro-esophageal reflux disease without esophagitis: Secondary | ICD-10-CM | POA: Diagnosis present

## 2013-10-16 DIAGNOSIS — F411 Generalized anxiety disorder: Secondary | ICD-10-CM

## 2013-10-16 DIAGNOSIS — I219 Acute myocardial infarction, unspecified: Secondary | ICD-10-CM | POA: Diagnosis present

## 2013-10-16 DIAGNOSIS — I2109 ST elevation (STEMI) myocardial infarction involving other coronary artery of anterior wall: Principal | ICD-10-CM

## 2013-10-16 DIAGNOSIS — Z6830 Body mass index (BMI) 30.0-30.9, adult: Secondary | ICD-10-CM

## 2013-10-16 DIAGNOSIS — I2582 Chronic total occlusion of coronary artery: Secondary | ICD-10-CM | POA: Diagnosis present

## 2013-10-16 DIAGNOSIS — R079 Chest pain, unspecified: Secondary | ICD-10-CM

## 2013-10-16 HISTORY — DX: Essential (primary) hypertension: I10

## 2013-10-16 HISTORY — DX: Acute myocardial infarction, unspecified: I21.9

## 2013-10-16 HISTORY — DX: Hyperlipidemia, unspecified: E78.5

## 2013-10-16 HISTORY — DX: Chronic obstructive pulmonary disease, unspecified: J44.9

## 2013-10-16 LAB — PROTIME-INR
INR: 0.89 (ref 0.00–1.49)
PROTHROMBIN TIME: 11.9 s (ref 11.6–15.2)

## 2013-10-16 LAB — URINALYSIS, ROUTINE W REFLEX MICROSCOPIC
BILIRUBIN URINE: NEGATIVE
Glucose, UA: NEGATIVE mg/dL
Ketones, ur: NEGATIVE mg/dL
Leukocytes, UA: NEGATIVE
Nitrite: NEGATIVE
Protein, ur: 30 mg/dL — AB
SPECIFIC GRAVITY, URINE: 1.01 (ref 1.005–1.030)
UROBILINOGEN UA: 0.2 mg/dL (ref 0.0–1.0)
pH: 7 (ref 5.0–8.0)

## 2013-10-16 LAB — URINE MICROSCOPIC-ADD ON

## 2013-10-16 LAB — COMPREHENSIVE METABOLIC PANEL
ALT: 24 U/L (ref 0–35)
AST: 63 U/L — AB (ref 0–37)
Albumin: 3.9 g/dL (ref 3.5–5.2)
Alkaline Phosphatase: 94 U/L (ref 39–117)
BILIRUBIN TOTAL: 0.4 mg/dL (ref 0.3–1.2)
BUN: 12 mg/dL (ref 6–23)
CALCIUM: 10.4 mg/dL (ref 8.4–10.5)
CHLORIDE: 86 meq/L — AB (ref 96–112)
CO2: 30 mEq/L (ref 19–32)
Creatinine, Ser: 0.66 mg/dL (ref 0.50–1.10)
GFR calc Af Amer: >90 mL/min (ref >90)
GFR calc non Af Amer: >90 mL/min (ref >90)
Glucose, Bld: 114 mg/dL — ABNORMAL HIGH (ref 70–99)
Potassium: 4.6 mEq/L (ref 3.7–5.3)
SODIUM: 130 meq/L — AB (ref 137–147)
Total Protein: 8.1 g/dL (ref 6.0–8.3)

## 2013-10-16 LAB — CBC WITH DIFFERENTIAL/PLATELET
Basophils Absolute: 0 10*3/uL (ref 0.0–0.1)
Basophils Relative: 0 % (ref 0–1)
Eosinophils Absolute: 0 10*3/uL (ref 0.0–0.7)
Eosinophils Relative: 0 % (ref 0–5)
HCT: 46.5 % — ABNORMAL HIGH (ref 36.0–46.0)
Hemoglobin: 16.8 g/dL — ABNORMAL HIGH (ref 12.0–15.0)
LYMPHS PCT: 20 % (ref 12–46)
Lymphs Abs: 3.4 10*3/uL (ref 0.7–4.0)
MCH: 31.6 pg (ref 26.0–34.0)
MCHC: 36.1 g/dL — ABNORMAL HIGH (ref 30.0–36.0)
MCV: 87.4 fL (ref 78.0–100.0)
MONOS PCT: 5 % (ref 3–12)
Monocytes Absolute: 0.9 10*3/uL (ref 0.1–1.0)
Neutro Abs: 12.8 10*3/uL — ABNORMAL HIGH (ref 1.7–7.7)
Neutrophils Relative %: 75 % (ref 43–77)
PLATELETS: 238 10*3/uL (ref 150–400)
RBC: 5.32 MIL/uL — AB (ref 3.87–5.11)
RDW: 12.4 % (ref 11.5–15.5)
WBC: 17.1 10*3/uL — AB (ref 4.0–10.5)

## 2013-10-16 LAB — I-STAT TROPONIN, ED: Troponin i, poc: 2.97 ng/mL (ref 0.00–0.08)

## 2013-10-16 LAB — PRO B NATRIURETIC PEPTIDE: Pro B Natriuretic peptide (BNP): 6899 pg/mL — ABNORMAL HIGH (ref 0–125)

## 2013-10-16 LAB — APTT: aPTT: 29 seconds (ref 24–37)

## 2013-10-16 LAB — TROPONIN I: TROPONIN I: 5.15 ng/mL — AB (ref <0.30)

## 2013-10-16 MED ORDER — METOPROLOL TARTRATE 1 MG/ML IV SOLN
5.0000 mg | Freq: Once | INTRAVENOUS | Status: AC
  Administered 2013-10-16: 5 mg via INTRAVENOUS
  Filled 2013-10-16: qty 5

## 2013-10-16 MED ORDER — HEPARIN BOLUS VIA INFUSION
4000.0000 [IU] | INTRAVENOUS | Status: AC
  Administered 2013-10-16: 4000 [IU] via INTRAVENOUS
  Filled 2013-10-16: qty 4000

## 2013-10-16 MED ORDER — SODIUM CHLORIDE 0.9 % IJ SOLN
3.0000 mL | Freq: Two times a day (BID) | INTRAMUSCULAR | Status: DC
  Administered 2013-10-16: 3 mL via INTRAVENOUS

## 2013-10-16 MED ORDER — NITROGLYCERIN 0.4 MG SL SUBL
0.4000 mg | SUBLINGUAL_TABLET | SUBLINGUAL | Status: DC | PRN
  Filled 2013-10-16: qty 1

## 2013-10-16 MED ORDER — SODIUM CHLORIDE 0.9 % IV SOLN
1.0000 mL/kg/h | INTRAVENOUS | Status: DC
  Administered 2013-10-16: 1 mL/kg/h via INTRAVENOUS

## 2013-10-16 MED ORDER — ASPIRIN 81 MG PO CHEW
81.0000 mg | CHEWABLE_TABLET | ORAL | Status: AC
  Administered 2013-10-17: 81 mg via ORAL
  Filled 2013-10-16: qty 1

## 2013-10-16 MED ORDER — SODIUM CHLORIDE 0.9 % IV SOLN: 250.0000 mL | INTRAVENOUS | Status: DC | PRN

## 2013-10-16 MED ORDER — ATORVASTATIN CALCIUM 80 MG PO TABS
80.0000 mg | ORAL_TABLET | Freq: Every day | ORAL | Status: DC
  Administered 2013-10-16 – 2013-10-20 (×5): 80 mg via ORAL
  Filled 2013-10-16 (×7): qty 1

## 2013-10-16 MED ORDER — ASPIRIN 300 MG RE SUPP: 300.0000 mg | RECTAL | Status: AC

## 2013-10-16 MED ORDER — NEBIVOLOL HCL 10 MG PO TABS
10.0000 mg | ORAL_TABLET | Freq: Every day | ORAL | Status: DC
  Administered 2013-10-16 – 2013-10-21 (×5): 10 mg via ORAL
  Filled 2013-10-16 (×7): qty 1

## 2013-10-16 MED ORDER — ASPIRIN EC 81 MG PO TBEC
81.0000 mg | DELAYED_RELEASE_TABLET | Freq: Every day | ORAL | Status: DC
  Administered 2013-10-18 – 2013-10-21 (×4): 81 mg via ORAL
  Filled 2013-10-16 (×4): qty 1

## 2013-10-16 MED ORDER — HEPARIN (PORCINE) IN NACL 100-0.45 UNIT/ML-% IJ SOLN
1000.0000 [IU]/h | INTRAMUSCULAR | Status: DC
  Administered 2013-10-16: 800 [IU]/h via INTRAVENOUS
  Filled 2013-10-16 (×2): qty 250

## 2013-10-16 MED ORDER — HEPARIN (PORCINE) IN NACL 100-0.45 UNIT/ML-% IJ SOLN
900.0000 [IU]/h | INTRAMUSCULAR | Status: DC
  Filled 2013-10-16: qty 250

## 2013-10-16 MED ORDER — IRBESARTAN 150 MG PO TABS
150.0000 mg | ORAL_TABLET | Freq: Every day | ORAL | Status: DC
  Administered 2013-10-16 – 2013-10-18 (×2): 150 mg via ORAL
  Filled 2013-10-16 (×4): qty 1

## 2013-10-16 MED ORDER — ASPIRIN 81 MG PO CHEW
324.0000 mg | CHEWABLE_TABLET | Freq: Once | ORAL | Status: AC
  Administered 2013-10-16: 324 mg via ORAL
  Filled 2013-10-16: qty 4

## 2013-10-16 MED ORDER — ASPIRIN 81 MG PO CHEW
324.0000 mg | CHEWABLE_TABLET | ORAL | Status: AC
  Administered 2013-10-16: 324 mg via ORAL
  Filled 2013-10-16: qty 4

## 2013-10-16 MED ORDER — ASPIRIN EC 81 MG PO TBEC: 81.0000 mg | DELAYED_RELEASE_TABLET | Freq: Every day | ORAL | Status: DC

## 2013-10-16 MED ORDER — SODIUM CHLORIDE 0.9 % IJ SOLN: 3.0000 mL | INTRAMUSCULAR | Status: DC | PRN

## 2013-10-16 NOTE — H&P (Signed)
Chief Complaint:  Anterolateral STEMI, delayed presentation  HPI:  This is a 68 y.o. female with a past medical history significant for long-standing smoking, hyperlipidemia and hypertension, but not for diabetes mellitus or known cardiac or other vascular problems.  She began experiencing epigastric discomfort at rest on Monday. The symptoms occurred off and on. When severe the discomfort radiated up her retrosternal area to the base of her throat. It associated some dyspnea, but she denies diaphoresis, nausea, vomiting or other symptoms. She is chronically short of breath and very sedentary. She had recurrent symptoms today and decided to come to the emergency room. She thought it might be reflux.  Her electrocardiogram shows findings suggestive of a recent, but completed anterior wall infarction with Q waves from V3 to V6 and residual ST elevation in the same leads with T waves that are now turning negative. The cardiac troponin I (point of care) is elevated at approximately 3. Right now she is asymptomatic.  She has not seen a physician in 3 years. She has not been taking medications for that time period she states that she is "scared" of physicians.   She was previously seeing Dr. Lenna Gilford who had prescribed antihypertensive medications and fish oil supplements. Laboratory tests in 2011 show hypercholesterolemia (255), hypertriglyceridemia (501) and high LDL cholesterol (157). She has normal renal function.  PMHx:  Past Medical History  Diagnosis Date  . Hypertension   . COPD (chronic obstructive pulmonary disease)   . Hyperlipidemia     History reviewed. No pertinent past surgical history.  FAMHx:  No family history on file.  SOCHx:   reports that she has been smoking.  She does not have any smokeless tobacco history on file. She reports that she does not drink alcohol. Her drug history is not on file.  ALLERGIES:  No Known Allergies  ROS: Constitutional: positive for fatigue,  negative for chills, fevers, night sweats and weight loss Eyes: negative Ears, nose, mouth, throat, and face: negative for hearing loss, snoring, sore throat and voice change Respiratory: positive for dyspnea on exertion, negative for cough, hemoptysis, sputum and stridor Cardiovascular: positive for chest pressure/discomfort, dyspnea, fatigue and irregular heart beat, negative for claudication, orthopnea, paroxysmal nocturnal dyspnea and syncope Gastrointestinal: negative for change in bowel habits, dysphagia, melena, nausea and vomiting Hematologic/lymphatic: negative for bleeding, easy bruising and petechiae Musculoskeletal:positive for arthralgias Neurological: negative for gait problems, headaches, seizures, speech problems, tremors and weakness Behavioral/Psych: negative Endocrine: positive for  , negative for diabetic symptoms including polydipsia, polyphagia and polyuria and temperature intolerance Allergic/Immunologic: negative  HOME MEDS:  (Not in a hospital admission)  LABS/IMAGING: Results for orders placed during the hospital encounter of 10/16/13 (from the past 48 hour(s))  CBC WITH DIFFERENTIAL     Status: Abnormal   Collection Time    10/16/13  3:50 PM      Result Value Ref Range   WBC 17.1 (*) 4.0 - 10.5 K/uL   RBC 5.32 (*) 3.87 - 5.11 MIL/uL   Hemoglobin 16.8 (*) 12.0 - 15.0 g/dL   HCT 46.5 (*) 36.0 - 46.0 %   MCV 87.4  78.0 - 100.0 fL   MCH 31.6  26.0 - 34.0 pg   MCHC 36.1 (*) 30.0 - 36.0 g/dL   RDW 12.4  11.5 - 15.5 %   Platelets 238  150 - 400 K/uL   Comment: REPEATED TO VERIFY     SPECIMEN CHECKED FOR CLOTS     PLATELET COUNT CONFIRMED BY SMEAR  Neutrophils Relative % 75  43 - 77 %   Lymphocytes Relative 20  12 - 46 %   Monocytes Relative 5  3 - 12 %   Eosinophils Relative 0  0 - 5 %   Basophils Relative 0  0 - 1 %   Neutro Abs 12.8 (*) 1.7 - 7.7 K/uL   Lymphs Abs 3.4  0.7 - 4.0 K/uL   Monocytes Absolute 0.9  0.1 - 1.0 K/uL   Eosinophils Absolute 0.0   0.0 - 0.7 K/uL   Basophils Absolute 0.0  0.0 - 0.1 K/uL   Smear Review MORPHOLOGY UNREMARKABLE    COMPREHENSIVE METABOLIC PANEL     Status: Abnormal   Collection Time    10/16/13  3:50 PM      Result Value Ref Range   Sodium 130 (*) 137 - 147 mEq/L   Potassium 4.6  3.7 - 5.3 mEq/L   Chloride 86 (*) 96 - 112 mEq/L   CO2 30  19 - 32 mEq/L   Glucose, Bld 114 (*) 70 - 99 mg/dL   BUN 12  6 - 23 mg/dL   Creatinine, Ser 0.66  0.50 - 1.10 mg/dL   Calcium 10.4  8.4 - 10.5 mg/dL   Total Protein 8.1  6.0 - 8.3 g/dL   Albumin 3.9  3.5 - 5.2 g/dL   AST 63 (*) 0 - 37 U/L   ALT 24  0 - 35 U/L   Alkaline Phosphatase 94  39 - 117 U/L   Total Bilirubin 0.4  0.3 - 1.2 mg/dL   GFR calc non Af Amer >90  >90 mL/min   GFR calc Af Amer >90  >90 mL/min   Comment: (NOTE)     The eGFR has been calculated using the CKD EPI equation.     This calculation has not been validated in all clinical situations.     eGFR's persistently <90 mL/min signify possible Chronic Kidney     Disease.  Randolm Idol, ED     Status: Abnormal   Collection Time    10/16/13  3:56 PM      Result Value Ref Range   Troponin i, poc 2.97 (*) 0.00 - 0.08 ng/mL   Comment NOTIFIED PHYSICIAN     Comment 3            Comment: Due to the release kinetics of cTnI,     a negative result within the first hours     of the onset of symptoms does not rule out     myocardial infarction with certainty.     If myocardial infarction is still suspected,     repeat the test at appropriate intervals.   Dg Chest Port 1 View  10/16/2013   CLINICAL DATA:  Pain.  EXAM: PORTABLE CHEST - 1 VIEW  COMPARISON:  Chest x-ray 12/11/2009.  FINDINGS: Mediastinum and hilar structures are normal. Lungs are clear. Heart size normal. Chest is stable from prior study.  IMPRESSION: No acute abnormality.   Electronically Signed   By: Marcello Moores  Register   On: 10/16/2013 16:47    VITALS: Blood pressure 146/81, pulse 75, temperature 98.4 F (36.9 C), temperature  source Oral, resp. rate 18, height $RemoveBe'5\' 2"'ztpBesuRM$  (1.575 m), weight 77.111 kg (170 lb), SpO2 93.00%.  EXAM:  General: Alert, oriented x3, no distress Head: no evidence of trauma, PERRL, EOMI, no exophtalmos or lid lag, no myxedema, no xanthelasma; normal ears, nose and oropharynx Neck: norma; jugular venous pulsations and no  hepatojugular reflux; brisk carotid pulses without delay and no carotid bruits Chest: Emphysematous chest with severe reduction in breath sounds throughout but without any wheezing or rales, no signs of consolidation by percussion or palpation, normal fremitus, symmetrical and full respiratory excursions Cardiovascular: normal position and quality of the apical impulse, regular rhythm, normal first heart sound and normal second heart sounds, no rubs or gallops, no murmur Abdomen: Obesity limits the exam , no tenderness or distention, no masses by palpation, no abnormal pulsatility or arterial bruits, normal bowel sounds, no hepatosplenomegaly Extremities: no clubbing, cyanosis or edema; 2+ radial, ulnar and brachial pulses bilaterally; 2+ right femoral, posterior tibial and dorsalis pedis pulses; 2+ left femoral, posterior tibial and dorsalis pedis pulses; no subclavian or femoral bruits Neurological: grossly nonfocal   IMPRESSION: Delayed presentation of extensive anterolateral ST segment elevation myocardial infarction with Q waves already evident in multiple leads and elevated cardiac enzymes. Asymptomatic at present but had recurrent symptoms earlier today, suggesting there is still some viable myocardium. She'll benefit from coronary angiography and revascularization, but this is not emergent. We'll plan for cardiac catheterization in the morning and the risks and benefits of diagnostic angiography as well as possible and the placenta was discussed in detail with the patient and her son who is present at the interview.  Special rate he has LV dysfunction and we'll plan on treatment  with low-dose beta blocker (note physical exam consistent with severe emphysema) and ACE inhibitor his as well as aspirin, intravenous heparin, statin. We'll hold the final. He is for now in case of multivessel CAD, although I don't think she will be an ideal candidate for CABG reason of multivessel disease.   Sanda Klein, MD, [facility] [phone] office [phone] pager  10/16/2013, 5:15 PM

## 2013-10-16 NOTE — Progress Notes (Signed)
   CARE MANAGEMENT ED NOTE 10/16/2013  Patient:  Beverly Wheeler, Beverly Wheeler   Account Number:  [id number]  Date Initiated:  10/16/2013  Documentation initiated by:  Radford Pax  Subjective/Objective Assessment:   Patient presents to Ed with epigastric pain, and nausea.     Subjective/Objective Assessment Detail:   Patient with past mdical history of HTN, COPD, troponin level 5.15, WBC 17.1     Action/Plan:   Patient to be transferred to [facility]   Action/Plan Detail:   Anticipated DC Date:       Status Recommendation to Physician:   Result of Recommendation:    Other ED Services  Consult Working Plan    DC Planning Services  Other  PCP issues    Choice offered to / List presented to:            Status of service:  Completed, signed off  ED Comments:   ED Comments Detail:  Patient confirms she does not have a pcp.  Patient reports she used to see Dr. Kriste Basque but has not seen him in 2.5 years.  Patient reports she is looking for another physician.  EDCM instructed patient to call the phone number on the back of her insurance card or go to insurance company website to help her find a doctor who is close to her and within network.  Patient verbalized understanding. No further EDCM needs at this time.

## 2013-10-16 NOTE — ED Notes (Addendum)
Pt c/o epigastric pain x 2 days. States today she has been slightly naseuous. Denies any headache, blurred vision, SOB.

## 2013-10-16 NOTE — ED Provider Notes (Signed)
EKG Interpretation  Date/Time:  Wednesday October 16 2013 17:57:43 EDT Ventricular Rate:  66 PR Interval:  164 QRS Duration: 85 QT Interval:  459 QTC Calculation: 481 R Axis:   -68 Text Interpretation:  Sinus rhythm Probable left atrial enlargement Anterolateral infarct, age indeterminate No significant change since last tracing earlier today Confirmed by Marvelous Woolford  MD-I, Jibran Crookshanks ([id number]) on 10/16/2013 6:04:49 PM       Devoria Albe, MD, Franz Dell, MD 10/16/13 [phone]

## 2013-10-16 NOTE — ED Notes (Signed)
Bed: OY77 Expected date:  Expected time:  Means of arrival:  Comments: Nurses busy

## 2013-10-16 NOTE — ED Provider Notes (Signed)
CSN: [id number]     Arrival date & time 10/16/13  1440 History   First MD Initiated Contact with Patient 10/16/13 1530     Chief Complaint  Patient presents with  . Abdominal Pain    epigastric     (Consider location/radiation/quality/duration/timing/severity/associated sxs/prior Treatment) Patient is a 68 y.o. female presenting with abdominal pain.  Abdominal Pain Pain location:  Epigastric Pain quality: burning   Pain radiates to:  Does not radiate Pain severity now: Currently mild. Onset quality:  Gradual Duration:  3 days Timing: "Comes and goes" Progression:  Waxing and waning Chronicity:  New Context comment:  History COPD and GERD Relieved by:  Nothing Worsened by:  Nothing tried Ineffective treatments:  Acetaminophen Associated symptoms: nausea and vomiting (x1)   Associated symptoms: no chest pain, no cough, no diarrhea, no fever and no shortness of breath   Associated symptoms comment:  No lightheadedness   Past Medical History  Diagnosis Date  . Hypertension   . COPD (chronic obstructive pulmonary disease)   . Hyperlipidemia    History reviewed. No pertinent past surgical history. No family history on file. History  Substance Use Topics  . Smoking status: Current Every Day Smoker  . Smokeless tobacco: Not on file  . Alcohol Use: No   OB History   Grav Para Term Preterm Abortions TAB SAB Ect Mult Living                 Review of Systems  Constitutional: Negative for fever.  HENT: Negative for congestion.   Respiratory: Negative for cough and shortness of breath.   Cardiovascular: Negative for chest pain.  Gastrointestinal: Positive for nausea, vomiting (x1) and abdominal pain. Negative for diarrhea.  All other systems reviewed and are negative.      Allergies  Review of patient's allergies indicates no known allergies.  Home Medications   Current Outpatient Rx  Name  Route  Sig  Dispense  Refill  . nebivolol (BYSTOLIC) 10 MG tablet  Oral   Take 1 tablet (10 mg total) by mouth daily.   30 tablet   1     Pt needs ov with Dr. Kriste BasqueNadel for further refills   . valsartan-hydrochlorothiazide (DIOVAN HCT) 160-12.5 MG per tablet   Oral   Take 1 tablet by mouth daily.   30 tablet   1     Pt needs ov with Dr. Kriste BasqueNadel for further refills    BP 146/81  Pulse 75  Temp(Src) 98.4 F (36.9 C) (Oral)  Resp 18  SpO2 93% Physical Exam  Nursing note and vitals reviewed. Constitutional: She is oriented to person, place, and time. She appears well-developed and well-nourished. No distress.  HENT:  Head: Normocephalic and atraumatic.  Mouth/Throat: Oropharynx is clear and moist.  Eyes: Conjunctivae are normal. Pupils are equal, round, and reactive to light. No scleral icterus.  Neck: Neck supple.  Cardiovascular: Normal rate, regular rhythm, normal heart sounds and intact distal pulses.   No murmur heard. Pulmonary/Chest: Effort normal and breath sounds normal. No stridor. No respiratory distress. She has no rales.  Abdominal: Soft. Bowel sounds are normal. She exhibits no distension. There is no tenderness. There is no rebound and no guarding.  Musculoskeletal: Normal range of motion.  Neurological: She is alert and oriented to person, place, and time.  Skin: Skin is warm and dry. No rash noted.  Psychiatric: She has a normal mood and affect. Her behavior is normal.    ED Course  CRITICAL CARE  Performed by: Blake Divine DAVID III Authorized by: Blake Divine DAVID III Total critical care time: 35 minutes Critical care time was exclusive of separately billable procedures and treating other patients. Critical care was necessary to treat or prevent imminent or life-threatening deterioration of the following conditions: cardiac failure. Critical care was time spent personally by me on the following activities: development of treatment plan with patient or surrogate, discussions with consultants, evaluation of patient's response  to treatment, examination of patient, obtaining history from patient or surrogate, ordering and performing treatments and interventions, ordering and review of laboratory studies, ordering and review of radiographic studies, pulse oximetry, re-evaluation of patient's condition and review of old charts.   (including critical care time) Labs Review Labs Reviewed  CBC WITH DIFFERENTIAL - Abnormal; Notable for the following:    WBC 17.1 (*)    RBC 5.32 (*)    Hemoglobin 16.8 (*)    HCT 46.5 (*)    MCHC 36.1 (*)    Neutro Abs 12.8 (*)    All other components within normal limits  COMPREHENSIVE METABOLIC PANEL - Abnormal; Notable for the following:    Sodium 130 (*)    Chloride 86 (*)    Glucose, Bld 114 (*)    AST 63 (*)    All other components within normal limits  URINALYSIS, ROUTINE W REFLEX MICROSCOPIC - Abnormal; Notable for the following:    APPearance CLOUDY (*)    Hgb urine dipstick TRACE (*)    Protein, ur 30 (*)    All other components within normal limits  TROPONIN I - Abnormal; Notable for the following:    Troponin I 5.15 (*)    All other components within normal limits  PRO B NATRIURETIC PEPTIDE - Abnormal; Notable for the following:    Pro B Natriuretic peptide (BNP) 6899.0 (*)    All other components within normal limits  URINE MICROSCOPIC-ADD ON - Abnormal; Notable for the following:    Casts HYALINE CASTS (*)    All other components within normal limits  I-STAT TROPOININ, ED - Abnormal; Notable for the following:    Troponin i, poc 2.97 (*)    All other components within normal limits  MRSA PCR SCREENING  PROTIME-INR  APTT  CBC  TROPONIN I  TROPONIN I  TROPONIN I  TSH  LIPID PANEL  BASIC METABOLIC PANEL  HEPARIN LEVEL (UNFRACTIONATED)  HEPARIN LEVEL (UNFRACTIONATED)   Imaging Review Dg Chest Port 1 View  10/16/2013   CLINICAL DATA:  Pain.  EXAM: PORTABLE CHEST - 1 VIEW  COMPARISON:  Chest x-ray 12/11/2009.  FINDINGS: Mediastinum and hilar structures are  normal. Lungs are clear. Heart size normal. Chest is stable from prior study.  IMPRESSION: No acute abnormality.   Electronically Signed   By: Maisie Fus  Register   On: 10/16/2013 16:47  All radiology studies independently viewed by me.      EKG Interpretation   Date/Time:  Wednesday October 16 2013 15:29:23 EDT Ventricular Rate:  74 PR Interval:  155 QRS Duration: 83 QT Interval:  436 QTC Calculation: 484 R Axis:   -63 Text Interpretation:  Sinus rhythm Probable left atrial enlargement  Inferior infarct, old Anterolateral infarct, age indeterminate No old  tracing to compare Confirmed by [facility]  MD, TREY ([id number]) on 10/16/2013  3:39:24 PM      MDM   Final diagnoses:  ST elevation myocardial infarction (STEMI) of anterolateral wall, initial episode of care  COPD (chronic obstructive pulmonary disease)  Mixed hyperlipidemia  Tobacco abuse    68 year old female presenting with 2 days of intermittent epigastric pain. EKG concerning for some evolving ischemic changes.  Discussed case with Dr. Royann Shivers (cardiology).  Given subacute nature of presentation, he will admit with plan to cath tomorrow. Started IV heparin, given aspirin,  low dose beta blocker.    Candyce Churn III, MD 10/16/13 [phone]

## 2013-10-16 NOTE — ED Notes (Signed)
[facility] EDP given I Stat troponin results.

## 2013-10-16 NOTE — Progress Notes (Signed)
ANTICOAGULATION CONSULT NOTE - Initial Consult  Pharmacy Consult for Heparin Indication: chest pain/ACS  No Known Allergies  Patient Measurements: Height: 5\' 2"  (157.5 cm) Weight: 170 lb (77.111 kg) IBW/kg (Calculated) : 50.1 Heparin Dosing Weight: 67 kg Last documented weight 70 kg (12/10/09)  Vital Signs: Temp: 98.4 F (36.9 C) (03/25 1504) Temp src: Oral (03/25 1504) BP: 146/81 mmHg (03/25 1504) Pulse Rate: 75 (03/25 1504)  Labs:  Recent Labs  10/16/13 1550  HGB 16.8*  HCT 46.5*  PLT 238    Estimated Creatinine Clearance: 67.4 ml/min (by C-G formula based on Cr of 0.66).   Medical History: Past Medical History  Diagnosis Date  . Hypertension   . COPD (chronic obstructive pulmonary disease)   . Hyperlipidemia     Medications:  Scheduled:  . metoprolol  5 mg Intravenous Once   Infusions:  . heparin    . heparin      Assessment: 72 yoF presented to ED 3/25 with abdominal pain, concern for ACS.  PMH include COPD, HTN, HLD,  and GERD.  Pharmacy is consulted to dose IV heparin.  CBC: H/H 16.8/46.5, Plt 238  SCr 0.66  Troponin 2.97  Baseline coags pending   Goal of Therapy:  Heparin level 0.3-0.7 units/ml Monitor platelets by anticoagulation protocol: Yes   Plan:   Baseline labs pending, updated height and weight ordered.  Give heparin 4000 units bolus IV x 1  Start heparin IV infusion at 800 units/hr (8 ml/hr)  Heparin level 6 hours after starting  Daily heparin level and CBC  Continue to monitor H&H and platelets   Lynann Beaver PharmD, BCPS Pager [phone] 10/16/2013 5:55 PM

## 2013-10-17 ENCOUNTER — Encounter (HOSPITAL_COMMUNITY)
Admission: EM | Disposition: A | Discharge status: Home / Self Care | Payer: Self-pay | Source: Home / Self Care | Attending: Cardiovascular Disease

## 2013-10-17 ENCOUNTER — Other Ambulatory Visit: Payer: Self-pay

## 2013-10-17 DIAGNOSIS — Z01810 Encounter for preprocedural cardiovascular examination: Secondary | ICD-10-CM

## 2013-10-17 DIAGNOSIS — I2510 Atherosclerotic heart disease of native coronary artery without angina pectoris: Secondary | ICD-10-CM

## 2013-10-17 DIAGNOSIS — Z01818 Encounter for other preprocedural examination: Secondary | ICD-10-CM

## 2013-10-17 DIAGNOSIS — F17200 Nicotine dependence, unspecified, uncomplicated: Secondary | ICD-10-CM

## 2013-10-17 DIAGNOSIS — I219 Acute myocardial infarction, unspecified: Secondary | ICD-10-CM

## 2013-10-17 DIAGNOSIS — J449 Chronic obstructive pulmonary disease, unspecified: Secondary | ICD-10-CM

## 2013-10-17 HISTORY — PX: CARDIAC CATHETERIZATION: SHX172

## 2013-10-17 HISTORY — PX: LEFT HEART CATHETERIZATION WITH CORONARY ANGIOGRAM: SHX5451

## 2013-10-17 LAB — HEPARIN LEVEL (UNFRACTIONATED): Heparin Unfractionated: <0.1 IU/mL — ABNORMAL LOW (ref 0.30–0.70)

## 2013-10-17 LAB — MRSA PCR SCREENING: MRSA by PCR: NEGATIVE

## 2013-10-17 SURGERY — LEFT HEART CATHETERIZATION WITH CORONARY ANGIOGRAM
Anesthesia: LOCAL

## 2013-10-17 MED ORDER — FLUTICASONE PROPIONATE 50 MCG/ACT NA SUSP
2.0000 | Freq: Every day | NASAL | Status: DC
  Administered 2013-10-17 – 2013-10-21 (×4): 2 via NASAL
  Filled 2013-10-17 (×2): qty 16

## 2013-10-17 MED ORDER — SODIUM CHLORIDE 0.9 % IV SOLN
INTRAVENOUS | Status: AC
  Administered 2013-10-17: 75 mL/h via INTRAVENOUS

## 2013-10-17 MED ORDER — HEPARIN (PORCINE) IN NACL 100-0.45 UNIT/ML-% IJ SOLN
1200.0000 [IU]/h | INTRAMUSCULAR | Status: DC
  Administered 2013-10-17: 1000 [IU]/h via INTRAVENOUS
  Filled 2013-10-17 (×3): qty 250

## 2013-10-17 MED ORDER — LORATADINE 10 MG PO TABS
10.0000 mg | ORAL_TABLET | Freq: Every day | ORAL | Status: DC
  Administered 2013-10-17 – 2013-10-21 (×5): 10 mg via ORAL
  Filled 2013-10-17 (×5): qty 1

## 2013-10-17 MED ORDER — MIDAZOLAM HCL 2 MG/2ML IJ SOLN
INTRAMUSCULAR | Status: AC
  Filled 2013-10-17: qty 2

## 2013-10-17 MED ORDER — VERAPAMIL HCL 2.5 MG/ML IV SOLN
INTRAVENOUS | Status: AC
  Filled 2013-10-17: qty 2

## 2013-10-17 MED ORDER — SODIUM CHLORIDE 0.9 % IV SOLN: INTRAVENOUS | Status: DC | PRN

## 2013-10-17 MED ORDER — FENTANYL CITRATE 0.05 MG/ML IJ SOLN
INTRAMUSCULAR | Status: AC
  Filled 2013-10-17: qty 2

## 2013-10-17 MED ORDER — HEPARIN (PORCINE) IN NACL 2-0.9 UNIT/ML-% IJ SOLN
INTRAMUSCULAR | Status: AC
  Filled 2013-10-17: qty 1000

## 2013-10-17 MED ORDER — HEPARIN BOLUS VIA INFUSION
2000.0000 [IU] | INTRAVENOUS | Status: AC
  Administered 2013-10-17: 2000 [IU] via INTRAVENOUS
  Filled 2013-10-17: qty 2000

## 2013-10-17 MED ORDER — HEPARIN SODIUM (PORCINE) 1000 UNIT/ML IJ SOLN
INTRAMUSCULAR | Status: AC
  Filled 2013-10-17: qty 1

## 2013-10-17 MED ORDER — LIDOCAINE HCL (PF) 1 % IJ SOLN
INTRAMUSCULAR | Status: AC
  Filled 2013-10-17: qty 30

## 2013-10-17 MED ORDER — NITROGLYCERIN 0.2 MG/ML ON CALL CATH LAB
INTRAVENOUS | Status: AC
  Filled 2013-10-17: qty 1

## 2013-10-17 MED ORDER — NITROGLYCERIN IN D5W 200-5 MCG/ML-% IV SOLN
10.0000 ug/min | INTRAVENOUS | Status: DC
  Administered 2013-10-17: 10 ug/min via INTRAVENOUS
  Filled 2013-10-17: qty 250

## 2013-10-17 NOTE — Consult Note (Signed)
[address]       [place] [address]             [phone]        Weltha Cathy [facility] Medical Record #[id number] Date of Birth: [birth year]  Referring: Dr. Katrinka Blazing Primary Care: Dr Kriste Basque  Chief Complaint:    Chief Complaint  Patient presents with  . Abdominal Pain    epigastric    History of Present Illness:      Ms. Lemere is a 68 yo moderately obese female with known history of Hypertension, Hyperglyceridemia, Hypercholesterolemia, and Nicotine Abuse.  The patient has not seen a physician in 3 years.  She presented to the ED on 10/16/2013 with complaints of burning epigastric pain.  She states this started on Monday and has progressively gotten worse and on presentation was associated with nausea.  Occasionally the pain would radiate up to her retrosternal area to the base of her throat when severe.  She denied diaphoresis but states she is short of breath all of the time.  EKG obtained in the ED showed ischemic changes and Cardiology consult was obtained.  She was evaluated by Dr. Royann Shivers who reviewed the EKG and felt the patient recently suffered an anterior wall infarction with the presence of Q waves from V3 to V6.  Cardiac markers were also elevated.  He felt patient should be admitted for further care.  She was started on Beta Blocker, ACE, ASA, statin and IV Heparin.  The patient was chest pain free on admission to the hospital.  She underwent cardiac catheterization which showed severe 3 vessel CAD with total occlusion of mid RCA, mid to distal circumflex, and subtotal occlusion of the proximal LAD.  Her EF was reduced at 30-35%.  There is some question of viability in the anterior wall.  It was felt TCTS consult should be placed.  Currently the patient is chest pain free.  She is concerned about her lungs and being placed on a ventilator.  She continues to smoke and is short of breath most of the time.  She states she can do whatever she wants around the  house, but for the most part is sedentary.    Current Activity/ Functional Status: Patient is independent with mobility/ambulation, transfers, ADL's, IADL's.   Zubrod Score: At the time of surgery this patient's most appropriate activity status/level should be described as: []     0    Normal activity, no symptoms []     1    Restricted in physical strenuous activity but ambulatory, able to do out light work []     2    Ambulatory and capable of self care, unable to do work activities, up and about                 more than 50%  Of the time                            [x]     3    Only limited self care, in bed greater than 50% of waking hours []     4    Completely disabled, no self care, confined to bed or chair []     5    Moribund  Past Medical History  Diagnosis Date  . Hypertension   . COPD (chronic obstructive pulmonary disease)   . Hyperlipidemia   . Myocardial infarction 10/16/2013    History reviewed.  No pertinent past surgical history.  History  Smoking status  . Current Every Day Smoker -- 1.00 packs/day for 45 years  Smokeless tobacco  . Not on file    History  Alcohol Use No    History   Social History  . Marital Status: Married    Spouse Name: N/A    Number of Children: N/A  . Years of Education: N/A   Occupational History  . Not on file.   Social History Main Topics  . Smoking status: Current Every Day Smoker -- 1.00 packs/day for 45 years  . Smokeless tobacco: Not on file  . Alcohol Use: No  . Drug Use: No  . Sexual Activity: Not Currently   Other Topics Concern  . Not on file   Social History Narrative  . No narrative on file    No Known Allergies  Current Facility-Administered Medications  Medication Dose Route Frequency Provider Last Rate Last Dose  . 0.9 %  sodium chloride infusion   Intravenous Continuous Lyn Records III, MD 75 mL/hr at 10/17/13 1238 75 mL/hr at 10/17/13 1238  . [START ON 10/18/2013] aspirin EC tablet 81 mg  81 mg Oral  Daily Mihai Croitoru, MD      . atorvastatin (LIPITOR) tablet 80 mg  80 mg Oral q1800 Mihai Croitoru, MD   80 mg at 10/16/13 1914  . heparin ADULT infusion 100 units/mL (25000 units/250 mL)  1,000 Units/hr Intravenous Continuous Fredrik Rigger, [facility]      . irbesartan (AVAPRO) tablet 150 mg  150 mg Oral Daily Mihai Croitoru, MD   150 mg at 10/16/13 1914  . nebivolol (BYSTOLIC) tablet 10 mg  10 mg Oral Daily Mihai Croitoru, MD   10 mg at 10/16/13 1914  . nitroGLYCERIN (NITROSTAT) SL tablet 0.4 mg  0.4 mg Sublingual Q5 min PRN Candyce Churn III, MD      . nitroGLYCERIN 0.2 mg/mL in dextrose 5 % infusion  10 mcg/min Intravenous Continuous Lyn Records III, MD 3 mL/hr at 10/17/13 1204 10 mcg/min at 10/17/13 1204    Prescriptions prior to admission  Medication Sig Dispense Refill  . acetaminophen (TYLENOL) 500 MG tablet Take 1,000 mg by mouth every 6 (six) hours as needed for mild pain.      [name] atenolol (TENORMIN) 25 MG tablet Take 25 mg by mouth daily.      . Multiple Vitamin (MULTIVITAMIN WITH MINERALS) TABS tablet Take 1 tablet by mouth daily.      [name] omega-3 acid ethyl esters (LOVAZA) 1 G capsule Take 1,500 g by mouth daily.      [name] OVER THE COUNTER MEDICATION Take 2 tablets by mouth daily. Marshmellow root      . vitamin E 400 UNIT capsule Take 400 Units by mouth daily.        No family history on file.   Review of Systems:     Cardiac Review of Systems: Y or N  Chest Pain [  x  ]  Resting SOB [x  ] Exertional SOB  [ x ]  Orthopnea [  ]   Pedal Edema [   ]    Palpitations [  ] Syncope  [  ]   Presyncope [   ]  General Review of Systems: [Y] = yes [  ]=no Constitional: recent weight change [  ]; anorexia [  ]; fatigue [  ]; nausea [  ]; night sweats [  ]; fever [  ]; or  chills [  ]                                                               Dental: poor dentition[ x ]; Last Dentist visit:   Eye : blurred vision [  ]; diplopia [   ]; vision changes [  ];  Amaurosis fugax[   ]; Resp: cough [  ];  wheezing[  ];  hemoptysis[  ]; shortness of breath[  ]; paroxysmal nocturnal dyspnea[  ]; dyspnea on exertion[x  ]; or orthopnea[  ];  GI:  gallstones[  ], vomiting[  ];  dysphagia[  ]; melena[  ];  hematochezia [  ]; heartburn[  ];   Hx of  Colonoscopy[  ]; GU: kidney stones [  ]; hematuria[  ];   dysuria [  ];  nocturia[  ];  history of     obstruction [  ]; urinary frequency [  ]             Skin: rash, swelling[  ];, hair loss[  ];  peripheral edema[  ];  or itching[  ]; Musculosketetal: myalgias[  ];  joint swelling[  ];  joint erythema[  ];  joint pain[  ];  back pain[  ];  Heme/Lymph: bruising[  ];  bleeding[  ];  anemia[  ];  Neuro: TIA[  ];  headaches[  ];  stroke[  ];  vertigo[  ];  seizures[  ];   paresthesias[  ];  difficulty walking[  ];  Psych:depression[  ]; anxiety[  ];  Endocrine: diabetes[  ];  thyroid dysfunction[  ];  Immunizations: Flu [  ]; Pneumococcal[  ];  Other:  Physical Exam: BP 97/44  Pulse 59  Temp(Src) 98.2 F (36.8 C) (Oral)  Resp 15  Ht 5\' 2"  (1.575 m)  Wt 167 lb 1.7 oz (75.8 kg)  BMI 30.56 kg/m2  SpO2 95%  General appearance: alert, cooperative, no distress and moderately obese Heart: regular rate and rhythm Lungs: reduced breath sounds throughout, diffuse ronchi and poor movement of air Abdomen: soft, non-tender; bowel sounds normal; no masses,  no organomegaly Extremities: extremities normal, atraumatic, no cyanosis or edema   Diagnostic Studies & Laboratory data:     Recent Radiology Findings:   Dg Chest Port 1 View  10/16/2013   CLINICAL DATA:  Pain.  EXAM: PORTABLE CHEST - 1 VIEW  COMPARISON:  Chest x-ray 12/11/2009.  FINDINGS: Mediastinum and hilar structures are normal. Lungs are clear. Heart size normal. Chest is stable from prior study.  IMPRESSION: No acute abnormality.   Electronically Signed   By: Maisie Fushomas  Register   On: 10/16/2013 16:47      Recent Lab Findings: Lab Results  Component Value Date   WBC 17.1*  10/16/2013   HGB 16.8* 10/16/2013   HCT 46.5* 10/16/2013   PLT 238 10/16/2013   GLUCOSE 114* 10/16/2013   CHOL 255* 12/11/2009   TRIG 501.0* 12/11/2009   HDL 36.10* 12/11/2009   LDLDIRECT 157.2 12/11/2009   ALT 24 10/16/2013   AST 63* 10/16/2013   NA 130* 10/16/2013   K 4.6 10/16/2013   CL 86* 10/16/2013   CREATININE 0.66 10/16/2013   BUN 12 10/16/2013   CO2 30 10/16/2013   TSH 1.37 12/11/2009   INR  0.89 10/16/2013   HGBA1C 6.3 12/11/2009   Cath:  COMPLICATIONS: None  HEMODYNAMICS: Aortic pressure 107/60 mmHg; LV pressure 113/24 mm work; LVEDP 30 mm mercury  ANGIOGRAPHIC DATA: The left main coronary artery is widely patent with distal 25% narrowing.  The left anterior descending artery is severely diseased with proximal 70% stenosis followed by a segmental 99% stenosis with TIMI grade 2 flow. The LAD is collateralized left to left..  The left circumflex artery is totally occluded after the first obtuse marginal the distal vessel is collateralized left to left. The first obtuse marginal contains an 80% stenosis.. The distal circumflex also supplies collaterals to the right coronary.  The ramus intermedius is a moderate-sized vessel that is widely patent and supplies collaterals to the LAD and distal circumflex.  The right coronary artery is totally occluded and the distal vessel is supplied by left to right collaterals. The circumflex.  LEFT VENTRICULOGRAM: Left ventricular angiogram was done in the 30 RAO projection and revealed apical akinesis/dyskinesis with moderate anterior wall hypokinesis and moderate to severe inferior wall hypokinesis LVEF 30-35%.  IMPRESSIONS: 1. Severe three-vessel coronary disease with total occlusion of the mid RCA, the mid to distal circumflex, and subtotal occlusion of the proximal LAD (with TIMI grade 2 flow, felt to be partially due to collateralization). The right coronary, the circumflex coronary, and possibly the distal LAD are all supplied by collaterals from the left  coronary/ramus intermedius. The distal right coronary, distal circumflex, and LAD all graftable.  2. Significant left ventricular dysfunction with estimated ejection fraction 3035% and regional wall motion abnormalities as noted above.  RECOMMENDATION: Consider surgical revascularization. Consultation has been called to TCTS.  I believe that the inferior and lateral walls are likely viable, as there appears to be wall motion present.. Not sure about the anterior wall, but suspect that there were pre-existing collaterals there as well. A viability study may be helpful if there is great concern    Assessment / Plan:      1. CAD- consulted for CABG 2. Pulm- COPD, likely severe by symptoms and exam will need PFTs to assess severity 3. HTN 4. Hypercholesterolemia- lipid panel pending 5. Nicotine abuse 6. Anatomically patient needs CABG, currently not ready with poor resp effort and  elevated WBC unknown cause but pulmonary likely. Consider CBG next week. Needs pulmonary consult.  Delight Ovens MD      [address] [place].[address] [place] [address] Office [phone]   Beeper [phone]

## 2013-10-17 NOTE — Care Management Note (Signed)
    Page 1 of 1   10/17/2013     7:47:19 AM   CARE MANAGEMENT NOTE 10/17/2013  Patient:  Beverly Wheeler, Beverly Wheeler   Account Number:  [id number]  Date Initiated:  10/17/2013  Documentation initiated by:  Junius Creamer  Subjective/Objective Assessment:   adm w mi     Action/Plan:   lives w husband   Anticipated DC Date:     Anticipated DC Plan:           Choice offered to / List presented to:             Status of service:   Medicare Important Message given?   (If response is "NO", the following Medicare IM given date fields will be blank) Date Medicare IM given:   Date Additional Medicare IM given:    Discharge Disposition:    Per UR Regulation:  Reviewed for med. necessity/level of care/duration of stay  If discussed at Long Length of Stay Meetings, dates discussed:    Comments:

## 2013-10-17 NOTE — CV Procedure (Signed)
     Left Heart Catheterization with Coronary Angiography  Report  Beverly Wheeler  68 y.o.  female [birth year]  Procedure Date: @PROCEDUREDT @ Referring Physician: Judie Petit. Croitoru, MD Primary Cardiologist: Sme  INDICATIONS: Late presenting anterior myocardial infarction  PROCEDURE: 1. Left heart catheterization; 2. Coronary angiography; 3. Left ventriculography  CONSENT:  The risks, benefits, and details of the procedure were explained in detail to the patient. Risks including death, stroke, heart attack, kidney injury, allergy, limb ischemia, bleeding and radiation injury were discussed.  The patient verbalized understanding and wanted to proceed.  Informed written consent was obtained.  PROCEDURE TECHNIQUE:  After Xylocaine anesthesia a 5 French Slender sheath was placed in the right radial artery with a single anterior needle wall stick.  Coronary angiography was done using a 5 [place] JR 4 and JL 3.5 cm diagnostic catheters.  Left ventriculography was done using the JR 4 catheter and hand injection.   Severe multivessel coronary disease was identified.  CONTRAST:  Total of 100 cc.  COMPLICATIONS:  None   HEMODYNAMICS:  Aortic pressure 107/60 mmHg; LV pressure 113/24 mm work; LVEDP 30 mm mercury  ANGIOGRAPHIC DATA:   The left main coronary artery is widely patent with distal 25% narrowing.  The left anterior descending artery is severely diseased with proximal 70% stenosis followed by a segmental 99% stenosis with TIMI grade 2 flow. The LAD is collateralized left to left..  The left circumflex artery is totally occluded after the first obtuse marginal the distal vessel is collateralized left to left. The first obtuse marginal contains an 80% stenosis.. The distal circumflex also supplies collaterals to the right coronary.  The ramus intermedius is a moderate-sized vessel that is widely patent and supplies collaterals to the LAD and distal circumflex.  The right coronary artery is  totally occluded and the distal vessel is supplied by left to right collaterals. The circumflex.    LEFT VENTRICULOGRAM:  Left ventricular angiogram was done in the 30 RAO projection and revealed apical akinesis/dyskinesis with moderate anterior wall hypokinesis and moderate to severe inferior wall hypokinesis LVEF 30-35%.   IMPRESSIONS:  1. Severe three-vessel coronary disease with total occlusion of the mid RCA, the mid to distal circumflex, and subtotal occlusion of the proximal LAD (with TIMI grade 2 flow, felt to be partially due to collateralization). The right coronary, the circumflex coronary, and possibly the distal LAD are all supplied by collaterals from the left coronary/ramus intermedius. The distal right coronary, distal circumflex, and LAD all graftable.  2. Significant left ventricular dysfunction with estimated ejection fraction 3035% and regional wall motion abnormalities as noted above.   RECOMMENDATION:   Consider surgical revascularization. Consultation has been called to TCTS. I believe that the inferior and lateral walls are likely viable, as there appears to be wall motion present.. Not sure about the anterior wall, but suspect that there were pre-existing collaterals there as well. A viability study may be helpful if there is great concern

## 2013-10-17 NOTE — Progress Notes (Signed)
Pre-op Cardiac Surgery  Carotid Findings:  1-39% ICA stenosis.  Vertebral artery flow is antegrade.  Upper Extremity Right Left  Brachial Pressures Radial cath with pressure bandages 98T  Radial Waveforms  T  Ulnar Waveforms  T  Palmar Arch (Allen's Test)  WNL   Findings:      Lower  Extremity Right Left  Dorsalis Pedis    Anterior Tibial    Posterior Tibial    Ankle/Brachial Indices      Findings:  palpable

## 2013-10-17 NOTE — Progress Notes (Signed)
ANTICOAGULATION CONSULT NOTE - Follow Up Consult  Pharmacy Consult for Heparin Indication: severe 3v CAD  No Known Allergies  Patient Measurements: Height: 5\' 2"  (157.5 cm) Weight: 167 lb 1.7 oz (75.8 kg) IBW/kg (Calculated) : 50.1 Heparin Dosing Weight: 70kg  Vital Signs: Temp: 98.7 F (37.1 C) (03/26 0700) Temp src: Oral (03/26 0700) BP: 119/76 mmHg (03/26 0700) Pulse Rate: 65 (03/26 1000)  Labs:  Recent Labs  10/16/13 1550 10/16/13 1620 10/16/13 1656 10/17/13 0117  HGB 16.8*  --   --   --   HCT 46.5*  --   --   --   PLT 238  --   --   --   APTT  --   --  29  --   LABPROT  --   --  11.9  --   INR  --   --  0.89  --   HEPARINUNFRC  --   --   --  <0.10*  CREATININE 0.66  --   --   --   TROPONINI  --  5.15*  --   --     Estimated Creatinine Clearance: 66.8 ml/min (by C-G formula based on Cr of 0.66).  Assessment: 67yof s/p cath found to have severe 3v CAD. TCTS consult pending for CABG. Heparin is to resume 8 hours post-sheath removal. Sheath removed at 1044.  Of note, patient was on heparin prior to cath - was subtherapeutic on 800 units/hr - re-bolused and rate increased to 1000 units/hr but follow-up level never checked.  Goal of Therapy:  Heparin level 0.3-0.7 units/ml Monitor platelets by anticoagulation protocol: Yes   Plan:  1) At 1845 tonight, resume heparin at 1000 units/hr (no bolus) 2) Check 6 hour heparin level 3) Daily heparin level and CBC 4) Follow up plans for CABG  Fredrik Rigger 10/17/2013,11:35 AM

## 2013-10-17 NOTE — Progress Notes (Signed)
Patient Name: Beverly Wheeler Date of Encounter: 10/17/2013  Active Problems:   Myocardial infarction   Acute MI   Length of Stay: 1  SUBJECTIVE  No angina or dyspnea overnight  CURRENT MEDS . [START ON 10/18/2013] aspirin EC  81 mg Oral Daily  . atorvastatin  80 mg Oral q1800  . irbesartan  150 mg Oral Daily  . nebivolol  10 mg Oral Daily  . sodium chloride  3 mL Intravenous Q12H    OBJECTIVE   Intake/Output Summary (Last 24 hours) at 10/17/13 1031 Last data filed at 10/17/13 0900  Gross per 24 hour  Intake 1029.86 ml  Output    650 ml  Net 379.86 ml   Filed Weights   10/16/13 1713 10/16/13 2215 10/17/13 0400  Weight: 77.111 kg (170 lb) 75.6 kg (166 lb 10.7 oz) 75.8 kg (167 lb 1.7 oz)    PHYSICAL EXAM Filed Vitals:   10/17/13 0400 10/17/13 0500 10/17/13 0600 10/17/13 0700  BP: 90/51 103/53  119/76  Pulse: 59 64 59 71  Temp: 97.2 F (36.2 C)   98.7 F (37.1 C)  TempSrc: Oral   Oral  Resp: 14 12 15 21   Height:      Weight: 75.8 kg (167 lb 1.7 oz)     SpO2: 98% 97% 96% 96%   General: Alert, oriented x3, no distress Head: no evidence of trauma, PERRL, EOMI, no exophtalmos or lid lag, no myxedema, no xanthelasma; normal ears, nose and oropharynx Neck: normal jugular venous pulsations and no hepatojugular reflux; brisk carotid pulses without delay and no carotid bruits Chest: clear to auscultation, no signs of consolidation by percussion or palpation, normal fremitus, symmetrical and full respiratory excursions Cardiovascular: normal position and quality of the apical impulse, regular rhythm, normal first and second heart sounds, no rubs or gallops, no murmur Abdomen: no tenderness or distention, no masses by palpation, no abnormal pulsatility or arterial bruits, normal bowel sounds, no hepatosplenomegaly Extremities: no clubbing, cyanosis or edema; 2+ radial, ulnar and brachial pulses bilaterally; 2+ right femoral, posterior tibial and dorsalis pedis pulses; 2+ left  femoral, posterior tibial and dorsalis pedis pulses; no subclavian or femoral bruits Neurological: grossly nonfocal  LABS  CBC  Recent Labs  10/16/13 1550  WBC 17.1*  NEUTROABS 12.8*  HGB 16.8*  HCT 46.5*  MCV 87.4  PLT 238   Basic Metabolic Panel  Recent Labs  10/16/13 1550  NA 130*  K 4.6  CL 86*  CO2 30  GLUCOSE 114*  BUN 12  CREATININE 0.66  CALCIUM 10.4   Liver Function Tests  Recent Labs  10/16/13 1550  AST 63*  ALT 24  ALKPHOS 94  BILITOT 0.4  PROT 8.1  ALBUMIN 3.9   No results found for this basename: LIPASE, AMYLASE,  in the last 72 hours Cardiac Enzymes  Recent Labs  10/16/13 1620  TROPONINI 5.15*    Radiology Studies Imaging results have been reviewed and Dg Chest Port 1 View  10/16/2013   CLINICAL DATA:  Pain.  EXAM: PORTABLE CHEST - 1 VIEW  COMPARISON:  Chest x-ray 12/11/2009.  FINDINGS: Mediastinum and hilar structures are normal. Lungs are clear. Heart size normal. Chest is stable from prior study.  IMPRESSION: No acute abnormality.   Electronically Signed   By: Maisie Fus  Register   On: 10/16/2013 16:47    TELE No ventricular arrhythmia  ECG No new tracing  ASSESSMENT AND PLAN   For coronary angio and possible PCI today for subacute anterolateral STEMI.  Thurmon FairMihai Shiven Junious, MD, [facility] CenterFACC [facility] [phone] office [phone] pager 10/17/2013 10:31 AM

## 2013-10-17 NOTE — Interval H&P Note (Signed)
History and Physical Interval Note:  10/17/2013 9:44 AM Data has been reviewed. The patient has a late presenting anterior myocardial infarction and is undergoing coronary angiography to define anatomy. The procedure and risks of stroke, death, myocardial infarction, kidney injury, limb ischemia, among others were discussed in detail with the patient and accepted. Beverly Wheeler  has presented today for surgery, with the diagnosis of CP  The various methods of treatment have been discussed with the patient and family. After consideration of risks, benefits and other options for treatment, the patient has consented to  Procedure(s): LEFT HEART CATHETERIZATION WITH CORONARY ANGIOGRAM (N/A) as a surgical intervention .  The patient's history has been reviewed, patient examined, no change in status, stable for surgery.  I have reviewed the patient's chart and labs.  Questions were answered to the patient's satisfaction.     Lesleigh Noe

## 2013-10-17 NOTE — Progress Notes (Signed)
ANTICOAGULATION CONSULT NOTE   Pharmacy Consult for Heparin Indication: chest pain/ACS  No Known Allergies  Patient Measurements: Height: 5\' 2"  (157.5 cm) Weight: 166 lb 10.7 oz (75.6 kg) IBW/kg (Calculated) : 50.1 Heparin Dosing Weight: 67 kg Last documented weight 70 kg (12/10/09)  Vital Signs: Temp: 97.9 F (36.6 C) (03/26 0000) Temp src: Oral (03/26 0000) BP: 103/57 mmHg (03/26 0200) Pulse Rate: 62 (03/26 0200)  Labs:  Recent Labs  10/16/13 1550 10/16/13 1620 10/16/13 1656 10/17/13 0117  HGB 16.8*  --   --   --   HCT 46.5*  --   --   --   PLT 238  --   --   --   APTT  --   --  29  --   LABPROT  --   --  11.9  --   INR  --   --  0.89  --   HEPARINUNFRC  --   --   --  <0.10*  CREATININE 0.66  --   --   --   TROPONINI  --  5.15*  --   --     Estimated Creatinine Clearance: 66.7 ml/min (by C-G formula based on Cr of 0.66).   Medical History: Past Medical History  Diagnosis Date  . Hypertension   . COPD (chronic obstructive pulmonary disease)   . Hyperlipidemia   . Myocardial infarction 10/16/2013    Medications:  Scheduled:  . aspirin  81 mg Oral Pre-Cath  . [START ON 10/18/2013] aspirin EC  81 mg Oral Daily  . atorvastatin  80 mg Oral q1800  . irbesartan  150 mg Oral Daily  . nebivolol  10 mg Oral Daily  . sodium chloride  3 mL Intravenous Q12H   Infusions:  . sodium chloride 1 mL/kg/hr (10/16/13 2257)  . heparin 800 Units/hr (10/16/13 1810)    Assessment: Beverly Wheeler presented to ED 3/25 with abdominal pain, concern for ACS.  PMH include COPD, HTN, HLD,  and GERD.  Pharmacy is consulted to dose IV heparin. Initial heparin level is <0.1 units/ml   Goal of Therapy:  Heparin level 0.3-0.7 units/ml Monitor platelets by anticoagulation protocol: Yes   Plan:   Give heparin 2000 units bolus IV x 1  Increase heparin IV infusion to 1000 units/hr (10 ml/hr)  Heparin level 6 hours  Daily heparin level and CBC  Continue to monitor H&H and  platelets   Lynann Beaver PharmD Pager [phone] 10/17/2013 2:54 AM

## 2013-10-17 NOTE — Consult Note (Signed)
PULMONARY / CRITICAL CARE MEDICINE  Name: Beverly Wheeler MRN: [id number] DOB: 05-10-1946    ADMISSION DATE:  10/16/2013 CONSULTATION DATE:  10/17/2013  REFERRING MD :  Dr. Tyrone SageGerhardt  PRIMARY SERVICE:  Dr. Royann Shiversroitoru  CHIEF COMPLAINT:  SOB / Pre-Op Pulmonary Clearance   BRIEF PATIENT DESCRIPTION: 68 y/o F, smoker admitted with anterolateral STEMI.  Found to have severe 3 vessel CAD on LHC.   PCCM consulted for pre-op pulmonary clearance / evaluation.    SIGNIFICANT EVENTS / STUDIES:  3/25 - Admit with chest pain, EKG changes c/w anterolateral STEMI 3/26 - LHC >> severe 3 vessel disease with total occlusion of mid RCA, mid-to-distal circumflex, & subtotal occlusion of proximal LAD.  EF 30-35%  LINES / TUBES:  CULTURES:  ANTIBIOTICS:  HISTORY OF PRESENT ILLNESS:  68 y/o F, smoker, with PMH of HTN, HLD, COPD who [facility] with a 3 week history of intermittent epigastric pain that she initially thought was heartburn.  She used marshmallow root and zegrid without symptom relief.  Symptoms worsened on 3/23 with severe mid sternal chest discomfort that radiated to neck associated with shortness of breath.  She was evaluated and found to have EKG changes consistent with anterolateral STEMI.  Patient was taken to the cath lab on 3/26 for LHC and found to have severe 3 vessel disease with total occlusion of mid RCA, mid-to-distal circumflex, & subtotal occlusion of proximal LAD.  EF 30-35%.  Beverly Wheeler was further evaluated by TCTS for CABG and is slotted for potential surgery next week pending pulmonary evaluation.   At baseline she is predominately sedentary.  She formerly worked in [employer].  She is able to walk short distances, tolerates activity of grocery shopping without rest.  Patient was formerly followed by Dr. Kriste BasqueNadel but has not seen an MD in 3 years ("I really don't want to talk about it).  She began smoking at age 68 and has continued to smoke up to day of  admission - heaviest up to 2ppd but most days 1ppd.  She is a native of [place].  Formerly had birds but none in past year.    PAST MEDICAL HISTORY :  Past Medical History  Diagnosis Date  . Hypertension   . COPD (chronic obstructive pulmonary disease)   . Hyperlipidemia   . Myocardial infarction 10/16/2013   History reviewed. No pertinent past surgical history. Prior to Admission medications   Medication Sig Start Date End Date Taking? Authorizing Provider  acetaminophen (TYLENOL) 500 MG tablet Take 1,000 mg by mouth every 6 (six) hours as needed for mild pain.   Yes Historical Provider, MD  atenolol (TENORMIN) 25 MG tablet Take 25 mg by mouth daily.   Yes Historical Provider, MD  Multiple Vitamin (MULTIVITAMIN WITH MINERALS) TABS tablet Take 1 tablet by mouth daily.   Yes Historical Provider, MD  omega-3 acid ethyl esters (LOVAZA) 1 G capsule Take 1,500 g by mouth daily.   Yes Historical Provider, MD  OVER THE COUNTER MEDICATION Take 2 tablets by mouth daily. Marshmellow root   Yes Historical Provider, MD  vitamin E 400 UNIT capsule Take 400 Units by mouth daily.   Yes Historical Provider, MD   No Known Allergies  FAMILY HISTORY:  No family history on file.  SOCIAL HISTORY:  reports that she has been smoking.  She does not have any smokeless tobacco history on file. She reports that she does not drink alcohol or use illicit drugs.  REVIEW  OF SYSTEMS:   Constitutional: Negative for fever, chills, weight loss, malaise/fatigue and diaphoresis.  HENT: Negative for hearing loss, ear pain, nosebleeds, congestion, sore throat, neck pain, tinnitus and ear discharge.   Eyes: Negative for blurred vision, double vision, photophobia, pain, discharge and redness.  Respiratory: Negative for cough, hemoptysis, sputum production, wheezing and stridor.  Reports shortness of breath with exertion.   Cardiovascular: Negative for palpitations, orthopnea, claudication, leg swelling and PND. Positive for chest  pain  Gastrointestinal: Negative for heartburn, nausea, vomiting, abdominal pain, diarrhea, constipation, blood in stool and melena.  Genitourinary: Negative for dysuria, urgency, frequency, hematuria and flank pain.  Musculoskeletal: Negative for myalgias, back pain, joint pain and falls.  Skin: Negative for itching and rash.  Neurological: Negative for dizziness, tingling, tremors, sensory change, speech change, focal weakness, seizures, loss of consciousness, weakness and headaches.  Endo/Heme/Allergies: Negative for environmental allergies and polydipsia. Does not bruise/bleed easily.  SUBJECTIVE:   VITAL SIGNS: Temp:  [97.2 F (36.2 C)-98.7 F (37.1 C)] 98.3 F (36.8 C) (03/26 1642) Pulse Rate:  [59-75] 62 (03/26 1645) Resp:  [12-23] 19 (03/26 1645) BP: (90-121)/(28-76) 118/53 mmHg (03/26 1645) SpO2:  [90 %-98 %] 96 % (03/26 1645) Weight:  [166 lb 10.7 oz (75.6 kg)-167 lb 1.7 oz (75.8 kg)] 167 lb 1.7 oz (75.8 kg) (03/26 0400)  PHYSICAL EXAMINATION: General: morbidly obese in NAD, up in chair Neuro:  AAOx 4, speech clear, MAE HEENT:  Mm pink/moist, poor dentition  Cardiovascular:  s1s2 rrr, no m/r/g Lungs:  resp's even/non-labored, lungs bilaterally with exp wheezing Abdomen:  Round/soft, bsx4 active  Musculoskeletal:  No acute deformities  Skin:  Warm/dry, no edema    Recent Labs Lab 10/16/13 1550  NA 130*  K 4.6  CL 86*  CO2 30  BUN 12  CREATININE 0.66  GLUCOSE 114*    Recent Labs Lab 10/16/13 1550  HGB 16.8*  HCT 46.5*  WBC 17.1*  PLT 238   Dg Chest Port 1 View  10/16/2013   CLINICAL DATA:  Pain.  EXAM: PORTABLE CHEST - 1 VIEW  COMPARISON:  Chest x-ray 12/11/2009.  FINDINGS: Mediastinum and hilar structures are normal. Lungs are clear. Heart size normal. Chest is stable from prior study.  IMPRESSION: No acute abnormality.   Electronically Signed   By: Maisie Fus  Register   On: 10/16/2013 16:47   ASSESSMENT / PLAN:  Presumed COPD Tobacco Use  disorder Seasonal Allergies  Plan: --> PFT --> await PFT results prior to initiating pulmonary regimen (previously used Advair PRN) --> will likely need inhaled LABA / Corticosteroid,  anti-cholinergic & PRN SABA --> ambulatory SpO2 evaluation prior to discharge --> oxygen for SpO2 90-92% --> smoking cessation  --> pre-op incentive spirometry   CAD  NSTEMI  HLD HTN  Plan: --> Recommendations per Cardiology:  ASA, Lipitor, Heparin gtt, Avapro, Bystolic, NTG gtt --> daily weights, heart healthy diet  Canary Brim, NP-C [facility] Pgr: [phone] or [phone]  10/17/2013, 9:49 PM  I have personally obtained history, examined patient, evaluated and interpreted laboratory and imaging results, reviewed medical records, formulated assessment / plan and placed orders.  Lonia Farber, MD Pulmonary and Critical Care Medicine [facility] Pager: [phone]  10/17/2013, 11:37 PM

## 2013-10-18 ENCOUNTER — Inpatient Hospital Stay (HOSPITAL_COMMUNITY): Payer: Medicare Other

## 2013-10-18 DIAGNOSIS — R079 Chest pain, unspecified: Secondary | ICD-10-CM

## 2013-10-18 DIAGNOSIS — F411 Generalized anxiety disorder: Secondary | ICD-10-CM

## 2013-10-18 LAB — CBC
HCT: 36.7 % (ref 36.0–46.0)
Hemoglobin: 13.3 g/dL (ref 12.0–15.0)
MCH: 32 pg (ref 26.0–34.0)
MCHC: 36.2 g/dL — ABNORMAL HIGH (ref 30.0–36.0)
MCV: 88.4 fL (ref 78.0–100.0)
Platelets: 170 10*3/uL (ref 150–400)
RBC: 4.15 MIL/uL (ref 3.87–5.11)
RDW: 12.6 % (ref 11.5–15.5)
WBC: 11.3 10*3/uL — ABNORMAL HIGH (ref 4.0–10.5)

## 2013-10-18 LAB — SPIROMETRY WITH GRAPH
FEF 25-75 Post: 0.68 L/sec
FEF 25-75 Pre: 0.34 L/sec
FEF2575-%Change-Post: 103 %
FEF2575-%Pred-Post: 35 %
FEF2575-%Pred-Pre: 17 %
FEV1-%Change-Post: 19 %
FEV1-%Pred-Post: 36 %
FEV1-%Pred-Pre: 30 %
FEV1-Post: 0.77 L
FEV1-Pre: 0.64 L
FEV1FVC-%Change-Post: 8 %
FEV1FVC-%Pred-Pre: 88 %
FEV6-%Change-Post: 8 %
FEV6-%Pred-Post: 37 %
FEV6-%Pred-Pre: 35 %
FEV6-Post: 1.01 L
FEV6-Pre: 0.94 L
FEV6FVC-%Change-Post: -1 %
FEV6FVC-%Pred-Post: 101 %
FEV6FVC-%Pred-Pre: 103 %
FVC-%Change-Post: 10 %
FVC-%Pred-Post: 37 %
FVC-%Pred-Pre: 33 %
FVC-Post: 1.04 L
FVC-Pre: 0.94 L
Post FEV1/FVC ratio: 74 %
Post FEV6/FVC ratio: 98 %
Pre FEV1/FVC ratio: 68 %
Pre FEV6/FVC Ratio: 100 %

## 2013-10-18 LAB — HEPARIN LEVEL (UNFRACTIONATED)
HEPARIN UNFRACTIONATED: 0.11 [IU]/mL — AB (ref 0.30–0.70)
Heparin Unfractionated: 0.3 IU/mL (ref 0.30–0.70)

## 2013-10-18 MED ORDER — BUDESONIDE 0.5 MG/2ML IN SUSP
0.5000 mg | Freq: Two times a day (BID) | RESPIRATORY_TRACT | Status: DC
  Administered 2013-10-18 – 2013-10-21 (×6): 0.5 mg via RESPIRATORY_TRACT
  Filled 2013-10-18 (×8): qty 2

## 2013-10-18 MED ORDER — TIOTROPIUM BROMIDE MONOHYDRATE 18 MCG IN CAPS
18.0000 ug | ORAL_CAPSULE | Freq: Every day | RESPIRATORY_TRACT | Status: DC
  Administered 2013-10-19 – 2013-10-21 (×3): 18 ug via RESPIRATORY_TRACT
  Filled 2013-10-18 (×2): qty 5

## 2013-10-18 MED ORDER — HEPARIN (PORCINE) IN NACL 100-0.45 UNIT/ML-% IJ SOLN
1300.0000 [IU]/h | INTRAMUSCULAR | Status: DC
  Administered 2013-10-18: 1300 [IU]/h via INTRAVENOUS

## 2013-10-18 MED ORDER — ALBUTEROL SULFATE (2.5 MG/3ML) 0.083% IN NEBU
2.5000 mg | INHALATION_SOLUTION | Freq: Once | RESPIRATORY_TRACT | Status: AC
  Administered 2013-10-18: 2.5 mg via RESPIRATORY_TRACT

## 2013-10-18 MED ORDER — HEPARIN SODIUM (PORCINE) 5000 UNIT/ML IJ SOLN
5000.0000 [IU] | Freq: Three times a day (TID) | INTRAMUSCULAR | Status: DC
  Administered 2013-10-18 – 2013-10-21 (×9): 5000 [IU] via SUBCUTANEOUS
  Filled 2013-10-18 (×12): qty 1

## 2013-10-18 MED ORDER — IRBESARTAN 300 MG PO TABS
300.0000 mg | ORAL_TABLET | Freq: Every day | ORAL | Status: DC
  Administered 2013-10-19 – 2013-10-21 (×3): 300 mg via ORAL
  Filled 2013-10-18 (×4): qty 1

## 2013-10-18 MED ORDER — ARFORMOTEROL TARTRATE 15 MCG/2ML IN NEBU
15.0000 ug | INHALATION_SOLUTION | Freq: Two times a day (BID) | RESPIRATORY_TRACT | Status: DC
  Administered 2013-10-18 – 2013-10-21 (×6): 15 ug via RESPIRATORY_TRACT
  Filled 2013-10-18 (×9): qty 2

## 2013-10-18 MED ORDER — LORATADINE 10 MG PO TABS: 10.0000 mg | ORAL_TABLET | Freq: Every day | ORAL | Status: DC

## 2013-10-18 MED ORDER — PANTOPRAZOLE SODIUM 40 MG PO TBEC
40.0000 mg | DELAYED_RELEASE_TABLET | Freq: Every day | ORAL | Status: DC
  Administered 2013-10-18: 40 mg via ORAL
  Filled 2013-10-18: qty 1

## 2013-10-18 MED ORDER — PANTOPRAZOLE SODIUM 40 MG PO TBEC
40.0000 mg | DELAYED_RELEASE_TABLET | Freq: Two times a day (BID) | ORAL | Status: DC
  Administered 2013-10-18 – 2013-10-21 (×7): 40 mg via ORAL
  Filled 2013-10-18 (×6): qty 1

## 2013-10-18 NOTE — Progress Notes (Signed)
Patient Name: Leonie GreenMartha Gunther Date of Encounter: 10/18/2013  Active Problems:   Myocardial infarction   Acute MI   COPD (chronic obstructive pulmonary disease)   Pre-op evaluation   Length of Stay: 2  SUBJECTIVE  Scared about CABG, feels "destroyed" by the news. No angina. PFTs are unfavorable: FEV1 around 34%, not responsive to beta-agonists  CURRENT MEDS . arformoterol  15 mcg Nebulization BID  . aspirin EC  81 mg Oral Daily  . atorvastatin  80 mg Oral q1800  . budesonide (PULMICORT) nebulizer solution  0.5 mg Nebulization BID  . fluticasone  2 spray Each Nare Daily  . irbesartan  150 mg Oral Daily  . loratadine  10 mg Oral Daily  . nebivolol  10 mg Oral Daily  . pantoprazole  40 mg Oral Q0600  . tiotropium  18 mcg Inhalation Daily    OBJECTIVE   Intake/Output Summary (Last 24 hours) at 10/18/13 1207 Last data filed at 10/18/13 0900  Gross per 24 hour  Intake 970.22 ml  Output   2050 ml  Net -1079.78 ml   Filed Weights   10/16/13 2215 10/17/13 0400 10/18/13 0448  Weight: 75.6 kg (166 lb 10.7 oz) 75.8 kg (167 lb 1.7 oz) 76.7 kg (169 lb 1.5 oz)    PHYSICAL EXAM Filed Vitals:   10/18/13 0600 10/18/13 0700 10/18/13 0805 10/18/13 1153  BP:   119/48 110/50  Pulse: 62 65    Temp:   98.2 F (36.8 C) 98.2 F (36.8 C)  TempSrc:   Oral Oral  Resp:      Height:      Weight:      SpO2: 95% 97% 94% 96%  General: Alert, oriented x3, no distress  Head: no evidence of trauma, PERRL, EOMI, no exophtalmos or lid lag, no myxedema, no xanthelasma; normal ears, nose and oropharynx  Neck: norma; jugular venous pulsations and no hepatojugular reflux; brisk carotid pulses without delay and no carotid bruits  Chest: Emphysematous chest with severe reduction in breath sounds throughout but without any wheezing or rales, no signs of consolidation by percussion or palpation, normal fremitus, symmetrical and full respiratory excursions  Cardiovascular: normal position and quality of  the apical impulse, regular rhythm, normal first heart sound and normal second heart sounds, no rubs or gallops, no murmur  Abdomen: Obesity limits the exam , no tenderness or distention, no masses by palpation, no abnormal pulsatility or arterial bruits, normal bowel sounds, no hepatosplenomegaly  Extremities: no clubbing, cyanosis or edema; 2+ radial, ulnar and brachial pulses bilaterally; 2+ right femoral, posterior tibial and dorsalis pedis pulses; 2+ left femoral, posterior tibial and dorsalis pedis pulses; no subclavian or femoral bruits  Neurological: grossly nonfocal  LABS  CBC  Recent Labs  10/16/13 1550 10/18/13 0045  WBC 17.1* 11.3*  NEUTROABS 12.8*  --   HGB 16.8* 13.3  HCT 46.5* 36.7  MCV 87.4 88.4  PLT 238 170   Basic Metabolic Panel  Recent Labs  10/16/13 1550  NA 130*  K 4.6  CL 86*  CO2 30  GLUCOSE 114*  BUN 12  CREATININE 0.66  CALCIUM 10.4   Liver Function Tests  Recent Labs  10/16/13 1550  AST 63*  ALT 24  ALKPHOS 94  BILITOT 0.4  PROT 8.1  ALBUMIN 3.9   No results found for this basename: LIPASE, AMYLASE,  in the last 72 hours Cardiac Enzymes  Recent Labs  10/16/13 1620  TROPONINI 5.15*   Radiology Studies Imaging results have been  reviewed and Dg Chest Port 1 View  10/16/2013   CLINICAL DATA:  Pain.  EXAM: PORTABLE CHEST - 1 VIEW  COMPARISON:  Chest x-ray 12/11/2009.  FINDINGS: Mediastinum and hilar structures are normal. Lungs are clear. Heart size normal. Chest is stable from prior study.  IMPRESSION: No acute abnormality.   Electronically Signed   By: Maisie Fus  Register   On: 10/16/2013 16:47    TELE No arrhythmia   ASSESSMENT AND PLAN  Marked reduction in WBC, almost normal - suspect this was postinfarction leukocytosis. Heparin unlikely to be of benefit after 72h post MI Tolerating ARB and nebivolol. Will try to increase ARB further. No overt CHF/hypervolemia.  Anatomy suggests best option is surgical revascularization,  but severity of lung dysfunction substantially increases the surgical risk. Options include delayed CABG (allow time for smoking cessation and aggressive pulmonary "tune-up" to lead to improved function) versus percutaneous revascularization which will likely be incomplete.  Will discuss with Dr. Tyrone Sage and interventional cardiology.  Transfer telemetry.   Thurmon Fair, MD, [facility] [phone] office [phone] pager 10/18/2013 12:07 PM

## 2013-10-18 NOTE — Progress Notes (Signed)
Patient ID: Beverly Wheeler, female   DOB: 1945/09/15, 68 y.o.   MRN: [id number]      [address]       [place] [id number]             [phone]                 1 Day Post-Op Procedure(s) (LRB): LEFT HEART CATHETERIZATION WITH CORONARY ANGIOGRAM (N/A)  LOS: 2 days   Subjective: Up in chair, on O2 not chest or abdominal pain  Objective: Vital signs in last 24 hours: Patient Vitals for the past 24 hrs:  BP Temp Temp src Pulse Resp SpO2 Weight  10/18/13 1153 110/50 mmHg 98.2 F (36.8 C) Oral - - 96 % -  10/18/13 0805 119/48 mmHg 98.2 F (36.8 C) Oral - - 94 % -  10/18/13 0700 - - - 65 - 97 % -  10/18/13 0600 - - - 62 - 95 % -  10/18/13 0500 - - - 60 14 97 % -  10/18/13 0448 123/40 mmHg - - - - - 169 lb 1.5 oz (76.7 kg)  10/18/13 0400 90/43 mmHg 98 F (36.7 C) Oral 74 15 98 % -  10/18/13 0300 - - - 68 18 100 % -  10/18/13 0200 - - - 60 15 96 % -  10/18/13 0100 - - - 63 15 95 % -  10/18/13 0000 109/47 mmHg - - 68 25 98 % -  10/17/13 2300 109/87 mmHg 97.8 F (36.6 C) Oral 68 22 97 % -  10/17/13 2200 - - - 73 20 96 % -  10/17/13 2100 - - - 70 26 96 % -  10/17/13 2000 105/48 mmHg 98.4 F (36.9 C) Oral 67 24 96 % -  10/17/13 1645 118/53 mmHg - - 62 19 96 % -  10/17/13 1642 118/53 mmHg 98.3 F (36.8 C) Oral 62 17 96 % -    Filed Weights   10/16/13 2215 10/17/13 0400 10/18/13 0448  Weight: 166 lb 10.7 oz (75.6 kg) 167 lb 1.7 oz (75.8 kg) 169 lb 1.5 oz (76.7 kg)    Hemodynamic parameters for last 24 hours:    Intake/Output from previous day: 03/26 0701 - 03/27 0700 In: 1056.2 [P.O.:240; I.V.:816.2] Out: 1650 [Urine:1650] Intake/Output this shift: Total I/O In: 164 [P.O.:120; I.V.:44] Out: 400 [Urine:400]  Scheduled Meds: . arformoterol  15 mcg Nebulization BID  . aspirin EC  81 mg Oral Daily  . atorvastatin  80 mg Oral q1800  . budesonide (PULMICORT) nebulizer solution  0.5 mg Nebulization BID  . fluticasone  2 spray Each Nare Daily  . heparin   5,000 Units Subcutaneous 3 times per day  . [START ON 10/19/2013] irbesartan  300 mg Oral Daily  . loratadine  10 mg Oral Daily  . nebivolol  10 mg Oral Daily  . pantoprazole  40 mg Oral Q0600  . tiotropium  18 mcg Inhalation Daily   Continuous Infusions: . sodium chloride    . nitroGLYCERIN Stopped (10/17/13 2000)   PRN Meds:.sodium chloride, nitroGLYCERIN  General appearance: alert, cooperative, appears older than stated age and no distress Neurologic: intact Heart: regular rate and rhythm, S1, S2 normal, no murmur, click, rub or gallop Lungs: diminished breath sounds bilaterally and wheezes bilaterally Abdomen: soft, non-tender; bowel sounds normal; no masses,  no organomegaly Extremities: extremities normal, atraumatic, no cyanosis or edema and Homans sign is negative, no sign of DVT  Lab Results: CBC: Recent Labs  10/16/13 1550 10/18/13 0045  WBC 17.1* 11.3*  HGB 16.8* 13.3  HCT 46.5* 36.7  PLT 238 170   BMET:  Recent Labs  10/16/13 1550  NA 130*  K 4.6  CL 86*  CO2 30  GLUCOSE 114*  BUN 12  CREATININE 0.66  CALCIUM 10.4    PT/INR:  Recent Labs  10/16/13 1656  LABPROT 11.9  INR 0.89     Radiology Dg Chest Port 1 View  10/16/2013   CLINICAL DATA:  Pain.  EXAM: PORTABLE CHEST - 1 VIEW  COMPARISON:  Chest x-ray 12/11/2009.  FINDINGS: Mediastinum and hilar structures are normal. Lungs are clear. Heart size normal. Chest is stable from prior study.  IMPRESSION: No acute abnormality.   Electronically Signed   By: Maisie Fus  Register   On: 10/16/2013 16:47   PFT's bedside FEV1 .6 30%  Assessment/Plan: S/P Procedure(s) (LRB): LEFT HEART CATHETERIZATION WITH CORONARY ANGIOGRAM (N/A) Severe COPD, FEV1 30%  Pulmonary has seen patient, she has idea of waiting 1 month then have CABG, not sure coronary anatomy will allow that. Cardiology to review films ? Half way approach with stent now and cabg later. I told her to wait over weekend with current resp rx and see how  she is before making a definite decision.   Delight Ovens MD 10/18/2013 4:08 PM

## 2013-10-18 NOTE — Progress Notes (Signed)
ANTICOAGULATION CONSULT NOTE - Follow Up Consult  Pharmacy Consult for Heparin Indication: severe 3v CAD  No Known Allergies  Patient Measurements: Height: 5\' 2"  (157.5 cm) Weight: 167 lb 1.7 oz (75.8 kg) IBW/kg (Calculated) : 50.1 Heparin Dosing Weight: 70kg  Vital Signs: Temp: 97.8 F (36.6 C) (03/26 2300) Temp src: Oral (03/26 2300) BP: 109/47 mmHg (03/27 0000) Pulse Rate: 68 (03/27 0000)  Labs:  Recent Labs  10/16/13 1550 10/16/13 1620 10/16/13 1656 10/17/13 0117 10/18/13 0045  HGB 16.8*  --   --   --  13.3  HCT 46.5*  --   --   --  36.7  PLT 238  --   --   --  170  APTT  --   --  29  --   --   LABPROT  --   --  11.9  --   --   INR  --   --  0.89  --   --   HEPARINUNFRC  --   --   --  <0.10* 0.11*  CREATININE 0.66  --   --   --   --   TROPONINI  --  5.15*  --   --   --     Estimated Creatinine Clearance: 66.8 ml/min (by C-G formula based on Cr of 0.66).  Assessment: 67yof s/p cath found to have severe 3v CAD. TCTS consult pending for CABG. Heparin is to resume 8 hours post-sheath removal. Sheath removed at 1044. Of note, patient was on heparin prior to cath - was subtherapeutic on 800 units/hr - re-bolused and rate increased to 1000 units/hr but follow-up level never checked. Initial heparin level 0.11 units/ml  Goal of Therapy:  Heparin level 0.3-0.7 units/ml Monitor platelets by anticoagulation protocol: Yes   Plan:  1) Increase heparin to 1200 units/hr (no bolus) 2) Check 6 hour heparin level 3) Daily heparin level and CBC 4) Follow up plans for CABG  Thanks for allowing pharmacy to be a part of this patient's care.  Talbert Cage, PharmD Clinical Pharmacist, [phone] 10/18/2013,1:26 AM

## 2013-10-18 NOTE — Progress Notes (Signed)
Reviewed images with Dr. Excell Seltzer. Low likelihood of success/ benefit from percutaneous revascularization LAD could be revascularized, but likely there is little viability in the anterior wall/apex - completed MI. RCA is not amenable to PCI. LCX PCI would be very challenging due to large side branch at the location of the occlusion. Best option remains CABG.

## 2013-10-18 NOTE — Progress Notes (Signed)
ANTICOAGULATION CONSULT NOTE - Follow Up Consult  Pharmacy Consult for Heparin Indication: severe 3v CAD  No Known Allergies  Patient Measurements: Height: 5\' 2"  (157.5 cm) Weight: 169 lb 1.5 oz (76.7 kg) IBW/kg (Calculated) : 50.1 Heparin Dosing Weight: 70kg  Vital Signs: Temp: 98.2 F (36.8 C) (03/27 0805) Temp src: Oral (03/27 0805) BP: 119/48 mmHg (03/27 0805) Pulse Rate: 65 (03/27 0700)  Labs:  Recent Labs  10/16/13 1550 10/16/13 1620 10/16/13 1656 10/17/13 0117 10/18/13 0045 10/18/13 0730  HGB 16.8*  --   --   --  13.3  --   HCT 46.5*  --   --   --  36.7  --   PLT 238  --   --   --  170  --   APTT  --   --  29  --   --   --   LABPROT  --   --  11.9  --   --   --   INR  --   --  0.89  --   --   --   HEPARINUNFRC  --   --   --  <0.10* 0.11* 0.30  CREATININE 0.66  --   --   --   --   --   TROPONINI  --  5.15*  --   --   --   --     Estimated Creatinine Clearance: 67.2 ml/min (by C-G formula based on Cr of 0.66).  Medications: Heparin @ 1200 units/hr  Assessment: 67yof s/p cath found to have severe 3v CAD. Heparin resumed post-cath pending CABG, which will not happen until patient's respiratory status improves. Heparin level is now therapeutic after rate increase this morning, but on the low end. CBC is stable. No bleeding reported.  Goal of Therapy:  Heparin level 0.3-0.7 units/ml Monitor platelets by anticoagulation protocol: Yes   Plan:  1) Increase heparin to 1300 units/hr to keep therapeutic 2) Check 6 hour heparin level  Louie Casa, PharmD, BCPS 10/18/2013,8:46 AM

## 2013-10-18 NOTE — Progress Notes (Addendum)
PULMONARY / CRITICAL CARE MEDICINE  Name: Otie Clendaniel MRN: [id number] DOB: [birth year]    ADMISSION DATE:  10/16/2013 CONSULTATION DATE:  10/17/2013  REFERRING MD :  Dr. Tyrone Sage  PRIMARY SERVICE:  Dr. Royann Shivers  CHIEF COMPLAINT:  SOB / Pre-Op Pulmonary Clearance   BRIEF PATIENT DESCRIPTION: 68 y/o F, smoker admitted with anterolateral STEMI.  Found to have severe 3 vessel CAD on LHC.   PCCM consulted for pre-op pulmonary clearance / evaluation.    SIGNIFICANT EVENTS / STUDIES:  3/25 - Admit with chest pain, EKG changes c/w anterolateral STEMI 3/26 - LHC >> severe 3 vessel disease with total occlusion of mid RCA, mid-to-distal circumflex, & subtotal occlusion of proximal LAD.  EF 30-35% PFTs 3/27: FVC: .94 liters (33%), FEV1/FVC: 68, FEV1 0.64 liters (30%) LINES / TUBES:  CULTURES:  ANTIBIOTICS:    SUBJECTIVE:  VITAL SIGNS: Temp:  [97.8 F (36.6 C)-98.4 F (36.9 C)] 98.2 F (36.8 C) (03/27 0805) Pulse Rate:  [59-74] 65 (03/27 0700) Resp:  [14-26] 14 (03/27 0500) BP: (90-123)/(38-87) 119/48 mmHg (03/27 0805) SpO2:  [94 %-100 %] 94 % (03/27 0805) Weight:  [76.7 kg (169 lb 1.5 oz)] 76.7 kg (169 lb 1.5 oz) (03/27 0448) 2 liters   PHYSICAL EXAMINATION: General: morbidly obese in NAD, up in chair Neuro:  AAOx 4, speech clear, MAE HEENT:  Mm pink/moist, poor dentition, voice hoarse  Cardiovascular:  s1s2 rrr, no m/r/g Lungs:  resp's even/non-labored, lungs bilaterally with exp wheezing Abdomen:  Round/soft, bsx4 active  Musculoskeletal:  No acute deformities  Skin:  Warm/dry, no edema    Recent Labs Lab 10/16/13 1550  NA 130*  K 4.6  CL 86*  CO2 30  BUN 12  CREATININE 0.66  GLUCOSE 114*    Recent Labs Lab 10/16/13 1550 10/18/13 0045  HGB 16.8* 13.3  HCT 46.5* 36.7  WBC 17.1* 11.3*  PLT 238 170   Dg Chest Port 1 View  10/16/2013   CLINICAL DATA:  Pain.  EXAM: PORTABLE CHEST - 1 VIEW  COMPARISON:  Chest x-ray 12/11/2009.  FINDINGS: Mediastinum and hilar  structures are normal. Lungs are clear. Heart size normal. Chest is stable from prior study.  IMPRESSION: No acute abnormality.   Electronically Signed   By: Maisie Fus  Register   On: 10/16/2013 16:47   ASSESSMENT / PLAN:  COPD FEV1 30% (stage III/Class D). Just stopped smoking.  Tobacco Use disorder Seasonal Allergies -->ideally would benefit from 4 weeks of smoking cessation before surgery,  if think things could be medically managed from cards stand-point.. If not risk of longer post-op vent dep, and post-op pulmonary complications higher however not a absolute contraindication.  Plan: -->add triple combo bd/ ICS -->walking O2 sats  -->cont flonase --> add antihistamine  --> cont GERD rx -follow prcxr for resolution int changes -PFT reviewed  CAD  NSTEMI  HLD HTN Plan: --> Recommendations per Cardiology:  ASA, Lipitor, Heparin gtt, Avapro, Bystolic, NTG gtt --> daily weights, heart healthy diet   10/18/2013, 11:15 AM   10/18/2013, 11:15 AM  Mcarthur Rossetti. Tyson Alias, MD, FACP Pgr: [phone] [facility]  Will re eval monday

## 2013-10-19 DIAGNOSIS — I059 Rheumatic mitral valve disease, unspecified: Secondary | ICD-10-CM

## 2013-10-19 LAB — CBC
HCT: 36.8 % (ref 36.0–46.0)
Hemoglobin: 13.1 g/dL (ref 12.0–15.0)
MCH: 32 pg (ref 26.0–34.0)
MCHC: 35.6 g/dL (ref 30.0–36.0)
MCV: 89.8 fL (ref 78.0–100.0)
Platelets: 152 10*3/uL (ref 150–400)
RBC: 4.1 MIL/uL (ref 3.87–5.11)
RDW: 12.8 % (ref 11.5–15.5)
WBC: 10.2 10*3/uL (ref 4.0–10.5)

## 2013-10-19 MED ORDER — ALUM & MAG HYDROXIDE-SIMETH 200-200-20 MG/5ML PO SUSP
30.0000 mL | Freq: Four times a day (QID) | ORAL | Status: DC | PRN
  Administered 2013-10-19: 30 mL via ORAL

## 2013-10-19 NOTE — Progress Notes (Addendum)
Patient Name: Beverly Wheeler Date of Encounter: 10/19/2013  Active Problems:   Myocardial infarction   Acute MI   COPD (chronic obstructive pulmonary disease)   Pre-op evaluation   Length of Stay: 3  SUBJECTIVE  Scared about CABG, denies CP, PFTs are unfavorable: FEV1 around 34%, not responsive to beta-agonists  CURRENT MEDS . arformoterol  15 mcg Nebulization BID  . aspirin EC  81 mg Oral Daily  . atorvastatin  80 mg Oral q1800  . budesonide (PULMICORT) nebulizer solution  0.5 mg Nebulization BID  . fluticasone  2 spray Each Nare Daily  . heparin  5,000 Units Subcutaneous 3 times per day  . irbesartan  300 mg Oral Daily  . loratadine  10 mg Oral Daily  . nebivolol  10 mg Oral Daily  . pantoprazole  40 mg Oral BID  . tiotropium  18 mcg Inhalation Daily   OBJECTIVE  Intake/Output Summary (Last 24 hours) at 10/19/13 1335 Last data filed at 10/19/13 1036  Gross per 24 hour  Intake    240 ml  Output    500 ml  Net   -260 ml   Filed Weights   10/17/13 0400 10/18/13 0448 10/19/13 0631  Weight: 167 lb 1.7 oz (75.8 kg) 169 lb 1.5 oz (76.7 kg) 173 lb 8 oz (78.7 kg)    PHYSICAL EXAM Filed Vitals:   10/19/13 0102 10/19/13 0448 10/19/13 0631 10/19/13 1022  BP:  102/61    Pulse:  61    Temp:  98.2 F (36.8 C)    TempSrc:  Oral    Resp:  18    Height:      Weight:   173 lb 8 oz (78.7 kg)   SpO2: 93% 93%  93%   General: Alert, oriented x3, no distress  Head: no evidence of trauma, PERRL, EOMI, no exophtalmos or lid lag, no myxedema, no xanthelasma; normal ears, nose and oropharynx  Neck: norma; jugular venous pulsations and no hepatojugular reflux; brisk carotid pulses without delay and no carotid bruits  Chest: Emphysematous chest with severe reduction in breath sounds throughout but without any wheezing or rales, no signs of consolidation by percussion or palpation, normal fremitus, symmetrical and full respiratory excursions  Cardiovascular: normal position and  quality of the apical impulse, regular rhythm, normal first heart sound and normal second heart sounds, no rubs or gallops, no murmur  Abdomen: Obesity limits the exam , no tenderness or distention, no masses by palpation, no abnormal pulsatility or arterial bruits, normal bowel sounds, no hepatosplenomegaly  Extremities: no clubbing, cyanosis or edema; 2+ radial, ulnar and brachial pulses bilaterally; 2+ right femoral, posterior tibial and dorsalis pedis pulses; 2+ left femoral, posterior tibial and dorsalis pedis pulses; no subclavian or femoral bruits  Neurological: grossly nonfocal  LABS  CBC  Recent Labs  10/16/13 1550 10/18/13 0045 10/19/13 0420  WBC 17.1* 11.3* 10.2  NEUTROABS 12.8*  --   --   HGB 16.8* 13.3 13.1  HCT 46.5* 36.7 36.8  MCV 87.4 88.4 89.8  PLT 238 170 152   Basic Metabolic Panel  Recent Labs  10/16/13 1550  NA 130*  K 4.6  CL 86*  CO2 30  GLUCOSE 114*  BUN 12  CREATININE 0.66  CALCIUM 10.4   Liver Function Tests  Recent Labs  10/16/13 1550  AST 63*  ALT 24  ALKPHOS 94  BILITOT 0.4  PROT 8.1  ALBUMIN 3.9   Cardiac Enzymes  Recent Labs  10/16/13 1620  TROPONINI 5.15*   TELE: SR, No arrhythmia  Cath: 10/17/2013 1. Severe three-vessel coronary disease with total occlusion of the mid RCA, the mid to distal circumflex, and subtotal occlusion of the proximal LAD (with TIMI grade 2 flow, felt to be partially due to collateralization). The right coronary, the circumflex coronary, and possibly the distal LAD are all supplied by collaterals from the left coronary/ramus intermedius. The distal right coronary, distal circumflex, and LAD all graftable.  2. Significant left ventricular dysfunction with estimated ejection fraction 3035% and regional wall motion abnormalities as noted above.    ASSESSMENT AND PLAN  CAD, STEMI, severe 3VD - Heparin unlikely to be of benefit after 72h post MI Tolerating ARB and nebivolol. Will try to increase ARB  further. No overt CHF/hypervolemia.  Reviewed images with Dr. Excell Seltzerooper. Low likelihood of success/ benefit from percutaneous revascularization LAD could be revascularized, but likely there is little viability in the anterior wall/apex - completed MI. RCA is not amenable to PCI. LCX PCI would be very challenging due to large side branch at the location of the occlusion. Best option remains CABG.  The patient is very anxious about CABG and would rather not do it or wait. We will follow.   10/19/2013 1:35 PM

## 2013-10-20 MED ORDER — LORAZEPAM 0.5 MG PO TABS
0.5000 mg | ORAL_TABLET | Freq: Four times a day (QID) | ORAL | Status: DC | PRN
  Administered 2013-10-20: 0.5 mg via ORAL
  Filled 2013-10-20: qty 1

## 2013-10-20 NOTE — Progress Notes (Signed)
Patient Name: Beverly Wheeler Date of Encounter: 10/20/2013  Active Problems:   Myocardial infarction   Acute MI   COPD (chronic obstructive pulmonary disease)   Pre-op evaluation   Length of Stay: 4  SUBJECTIVE  Scared about CABG, denies CP, PFTs are unfavorable: FEV1 around 34%, not responsive to beta-agonists  CURRENT MEDS . arformoterol  15 mcg Nebulization BID  . aspirin EC  81 mg Oral Daily  . atorvastatin  80 mg Oral q1800  . budesonide (PULMICORT) nebulizer solution  0.5 mg Nebulization BID  . fluticasone  2 spray Each Nare Daily  . heparin  5,000 Units Subcutaneous 3 times per day  . irbesartan  300 mg Oral Daily  . loratadine  10 mg Oral Daily  . nebivolol  10 mg Oral Daily  . pantoprazole  40 mg Oral BID  . tiotropium  18 mcg Inhalation Daily   OBJECTIVE  Intake/Output Summary (Last 24 hours) at 10/20/13 1142 Last data filed at 10/20/13 0821  Gross per 24 hour  Intake   1090 ml  Output    700 ml  Net    390 ml   Filed Weights   10/18/13 0448 10/19/13 0631 10/20/13 0517  Weight: 169 lb 1.5 oz (76.7 kg) 173 lb 8 oz (78.7 kg) 171 lb 14.4 oz (77.973 kg)    PHYSICAL EXAM Filed Vitals:   10/19/13 2136 10/20/13 0517 10/20/13 0907 10/20/13 1105  BP: 119/61 102/54  127/50  Pulse: 64 57  65  Temp: 97.6 F (36.4 C) 97.9 F (36.6 C)    TempSrc: Oral Oral    Resp: 19 16    Height:      Weight:  171 lb 14.4 oz (77.973 kg)    SpO2: 96% 98% 97%    General: Alert, oriented x3, no distress  Head: no evidence of trauma, PERRL, EOMI, no exophtalmos or lid lag, no myxedema, no xanthelasma; normal ears, nose and oropharynx  Neck: norma; jugular venous pulsations and no hepatojugular reflux; brisk carotid pulses without delay and no carotid bruits  Chest: Emphysematous chest with severe reduction in breath sounds throughout but without any wheezing or rales, no signs of consolidation by percussion or palpation, normal fremitus, symmetrical and full respiratory  excursions  Cardiovascular: normal position and quality of the apical impulse, regular rhythm, normal first heart sound and normal second heart sounds, no rubs or gallops, no murmur  Abdomen: Obesity limits the exam , no tenderness or distention, no masses by palpation, no abnormal pulsatility or arterial bruits, normal bowel sounds, no hepatosplenomegaly  Extremities: no clubbing, cyanosis or edema; 2+ radial, ulnar and brachial pulses bilaterally; 2+ right femoral, posterior tibial and dorsalis pedis pulses; 2+ left femoral, posterior tibial and dorsalis pedis pulses; no subclavian or femoral bruits  Neurological: grossly nonfocal  LABS  CBC  Recent Labs  10/18/13 0045 10/19/13 0420  WBC 11.3* 10.2  HGB 13.3 13.1  HCT 36.7 36.8  MCV 88.4 89.8  PLT 170 152   TELE: SR, No arrhythmia  Cath: 10/17/2013 1. Severe three-vessel coronary disease with total occlusion of the mid RCA, the mid to distal circumflex, and subtotal occlusion of the proximal LAD (with TIMI grade 2 flow, felt to be partially due to collateralization). The right coronary, the circumflex coronary, and possibly the distal LAD are all supplied by collaterals from the left coronary/ramus intermedius. The distal right coronary, distal circumflex, and LAD all graftable.  2. Significant left ventricular dysfunction with estimated ejection fraction 3035% and  regional wall motion abnormalities as noted above.    ASSESSMENT AND PLAN  CAD, STEMI, severe 3VD - Heparin unlikely to be of benefit after 72h post MI Tolerating ARB and nebivolol. Will try to increase ARB further. No overt CHF/hypervolemia.  Reviewed images with Dr. Excell Seltzerooper. Low likelihood of success/ benefit from percutaneous revascularization LAD could be revascularized, but likely there is little viability in the anterior wall/apex - completed MI. RCA is not amenable to PCI. LCX PCI would be very challenging due to large side branch at the location of the  occlusion. Best option remains CABG.  The patient is very anxious about CABG and would rather not do it or wait. We will follow.   10/20/2013 11:42 AM

## 2013-10-21 ENCOUNTER — Telehealth: Payer: Self-pay | Admitting: Physician Assistant

## 2013-10-21 DIAGNOSIS — I219 Acute myocardial infarction, unspecified: Secondary | ICD-10-CM

## 2013-10-21 MED ORDER — NEBIVOLOL HCL 10 MG PO TABS: 10.0000 mg | ORAL_TABLET | Freq: Every day | ORAL | Status: DC

## 2013-10-21 MED ORDER — IRBESARTAN 300 MG PO TABS: 300.0000 mg | ORAL_TABLET | Freq: Every day | ORAL | Status: DC

## 2013-10-21 MED ORDER — ARFORMOTEROL TARTRATE 15 MCG/2ML IN NEBU: 15.0000 ug | INHALATION_SOLUTION | Freq: Two times a day (BID) | RESPIRATORY_TRACT | Status: DC

## 2013-10-21 MED ORDER — LORATADINE 10 MG PO TABS: 10.0000 mg | ORAL_TABLET | Freq: Every day | ORAL | Status: DC

## 2013-10-21 MED ORDER — ASPIRIN 81 MG PO TBEC: 81.0000 mg | DELAYED_RELEASE_TABLET | Freq: Every day | ORAL | Status: DC

## 2013-10-21 MED ORDER — BUDESONIDE 0.5 MG/2ML IN SUSP: 0.5000 mg | Freq: Two times a day (BID) | RESPIRATORY_TRACT | Status: DC

## 2013-10-21 MED ORDER — ATORVASTATIN CALCIUM 80 MG PO TABS: 80.0000 mg | ORAL_TABLET | Freq: Every day | ORAL | Status: DC

## 2013-10-21 MED ORDER — FLUTICASONE PROPIONATE 50 MCG/ACT NA SUSP: 2.0000 | Freq: Every day | NASAL | Status: DC

## 2013-10-21 MED ORDER — TIOTROPIUM BROMIDE MONOHYDRATE 18 MCG IN CAPS: 18.0000 ug | ORAL_CAPSULE | Freq: Every day | RESPIRATORY_TRACT | Status: DC

## 2013-10-21 MED ORDER — NITROGLYCERIN 0.4 MG SL SUBL: 0.4000 mg | SUBLINGUAL_TABLET | SUBLINGUAL | Status: DC | PRN

## 2013-10-21 MED ORDER — PANTOPRAZOLE SODIUM 40 MG PO TBEC: 40.0000 mg | DELAYED_RELEASE_TABLET | Freq: Every day | ORAL | Status: DC

## 2013-10-21 NOTE — Progress Notes (Addendum)
CARDIAC REHAB PHASE I   PRE:  Rate/Rhythm: 63 SR  BP:  Supine:   Sitting: 110/54  Standing:    SaO2: 93 RA  MODE:  Ambulation: 450 ft   POST:  Rate/Rhythm: 75  BP:  Supine:   Sitting: 110/60  Standing:    SaO2: 91 RA 1005-1135 Pt tolerated ambulation well without c/o of cp or SOB. VS stable Pt to side of bed after walk with call light  in reach. Completed MI and CHF education with pt. She voices understanding. We discussed smoking cessation. I gave her tips for quitting, coaching contact number and quit smart class information. She seems committed to not smoking. Pt is not appropriate for Outpt. CRP at present due to impending heart surgery.  Melina Copa RN 10/21/2013 12:31 PM

## 2013-10-21 NOTE — Progress Notes (Signed)
PULMONARY / CRITICAL CARE MEDICINE  Name: Beverly Wheeler MRN: [id number] DOB: 04/18/1946    ADMISSION DATE:  10/16/2013 CONSULTATION DATE:  10/17/2013  REFERRING MD :  Dr. Tyrone Sage  PRIMARY SERVICE:  Dr. Royann Shivers  CHIEF COMPLAINT:  SOB / Pre-Op Pulmonary Clearance   BRIEF PATIENT DESCRIPTION: 68 y/o F, smoker admitted with anterolateral STEMI.  Found to have severe 3 vessel CAD on LHC.   PCCM consulted for pre-op pulmonary clearance / evaluation.    SIGNIFICANT EVENTS / STUDIES:  3/25 - Admit with chest pain, EKG changes c/w anterolateral STEMI 3/26 - LHC >> severe 3 vessel disease with total occlusion of mid RCA, mid-to-distal circumflex, & subtotal occlusion of proximal LAD.  EF 30-35% PFTs 3/27: FVC: .94 liters (33%), FEV1/FVC: 68, FEV1 0.64 liters (30%)  LINES / TUBES:  CULTURES:  ANTIBIOTICS:  SUBJECTIVE: Wants to hold off on surgery until her "lungs get better".   VITAL SIGNS: Temp:  [97 F (36.1 C)-98.6 F (37 C)] 98.6 F (37 C) (03/30 0559) Pulse Rate:  [58-65] 63 (03/30 0559) Resp:  [18-20] 20 (03/30 0559) BP: (95-127)/(50-71) 105/53 mmHg (03/30 0559) SpO2:  [92 %-94 %] 92 % (03/30 0945) Weight:  [169 lb 5 oz (76.8 kg)] 169 lb 5 oz (76.8 kg) (03/30 0559) 2 liters    PHYSICAL EXAMINATION: General: morbidly obese in NAD, up in chair Neuro:  AAOx 4, speech clear, MAE HEENT:  Mm pink/moist, poor dentition, voice hoarse  Cardiovascular:  s1s2 rrr, no m/r/g Lungs:  resp's even/non-labored, diminished bilat, few exp wheeze  Abdomen:  Round/soft, bsx4 active  Musculoskeletal:  No acute deformities  Skin:  Warm/dry, no edema    Recent Labs Lab 10/16/13 1550  NA 130*  K 4.6  CL 86*  CO2 30  BUN 12  CREATININE 0.66  GLUCOSE 114*    Recent Labs Lab 10/16/13 1550 10/18/13 0045 10/19/13 0420  HGB 16.8* 13.3 13.1  HCT 46.5* 36.7 36.8  WBC 17.1* 11.3* 10.2  PLT 238 170 152   No results found. ASSESSMENT / PLAN:  COPD FEV1 30% (stage III/Class D). Just  stopped smoking.  Tobacco Use disorder Seasonal Allergies -->ideally would benefit from 4 weeks of smoking cessation before surgery,  if think things could be medically managed from cards stand-point.. Does remain higher risk post op vent dependence.    Plan: - continue BD - pulmincort, brovana, spiriva - ambulatory desat prior to d/c - continue flonase  - continue GERD rx  - d/w patient that it may be prudent to wait ~4 weeks of smoking cessation but that her lungs will not "get better" and the BD are not to make her "COPD go away" which is what she believed the waiting time was to accomplish.   - happy to f/u as outpt and/or post op when she returns for CABG   CAD  NSTEMI  HLD HTN Plan:  - per Cardiology:  ASA, Lipitor, Heparin gtt, Avapro, Bystolic, NTG gtt   PCCM signing off please call back if needed   [facility], NP 10/21/2013  10:05 AM Pager: [phone] or [phone]  *Care during the described time interval was provided by me and/or other providers on the critical care team. I have reviewed this patient's available data, including medical history, events of note, physical examination and test results as part of my evaluation.

## 2013-10-21 NOTE — Progress Notes (Addendum)
Patient ambulated with Cardiac Rehab, tolerated well, RA sat pre at rest was 93%, with ambulation was 91%, Berle Mull RN

## 2013-10-21 NOTE — Progress Notes (Signed)
Patient Name: Beverly Wheeler Date of Encounter: 10/21/2013  Principal Problem:   Acute MI Active Problems:   HYPERLIPIDEMIA   HYPERTENSION   Myocardial infarction   COPD (chronic obstructive pulmonary disease)   Pre-op evaluation    Patient Profile 68 y.o. female with a past medical history significant for long-standing smoking, hyperlipidemia and hypertension, but not for diabetes mellitus or known cardiac or other vascular problems. Admitted 03/25 w/ NSTEMI, 3v dz.   SUBJECTIVE: No chest pain, no SOB. Walked several times yesterday w/out Sx. Wants lungs to get better before considering CABG.  OBJECTIVE Filed Vitals:   10/20/13 1420 10/20/13 2028 10/20/13 2053 10/21/13 0559  BP: 95/59  118/71 105/53  Pulse: 58  61 63  Temp: 97 F (36.1 C)  97.5 F (36.4 C) 98.6 F (37 C)  TempSrc: Oral  Oral Oral  Resp: 18  19 20   Height:      Weight:    169 lb 5 oz (76.8 kg)  SpO2: 94% 94% 93% 94%    Intake/Output Summary (Last 24 hours) at 10/21/13 0843 Last data filed at 10/21/13 0602  Gross per 24 hour  Intake    720 ml  Output   1400 ml  Net   -680 ml   Filed Weights   10/19/13 0631 10/20/13 0517 10/21/13 0559  Weight: 173 lb 8 oz (78.7 kg) 171 lb 14.4 oz (77.973 kg) 169 lb 5 oz (76.8 kg)    PHYSICAL EXAM General: Well developed, well nourished, female in no acute distress. Head: Normocephalic, atraumatic.  Neck: Supple without bruits, JVD not elevated. Lungs:  Resp regular and unlabored, dry rales bilateral bases. Heart: RRR, S1, S2, no S3, S4, or murmur; no rub. Abdomen: Soft, non-tender, non-distended, BS + x 4.  Extremities: No clubbing, cyanosis, trace edema.  Neuro: Alert and oriented X 3. Moves all extremities spontaneously. Psych: Normal affect.  LABS: CBC:  Recent Labs  10/19/13 0420  WBC 10.2  HGB 13.1  HCT 36.8  MCV 89.8  PLT 152   Cardiac Enzymes: Lab Results  Component Value Date   TROPONINI 5.15* 10/16/2013   BNP: Pro B Natriuretic  peptide (BNP)  Date/Time Value Ref Range Status  10/16/2013  4:20 PM 6899.0* 0 - 125 pg/mL Final    TELE:  SR, PVCs single and occasional pairs, sometimes > 10/min      Radiology/Studies: Dg Chest Port 1 View 10/16/2013   CLINICAL DATA:  Pain.  EXAM: PORTABLE CHEST - 1 VIEW  COMPARISON:  Chest x-ray 12/11/2009.  FINDINGS: Mediastinum and hilar structures are normal. Lungs are clear. Heart size normal. Chest is stable from prior study.  IMPRESSION: No acute abnormality.   Electronically Signed   By: Maisie Fus  Register   On: 10/16/2013 16:47     Current Medications:  . arformoterol  15 mcg Nebulization BID  . aspirin EC  81 mg Oral Daily  . atorvastatin  80 mg Oral q1800  . budesonide (PULMICORT) nebulizer solution  0.5 mg Nebulization BID  . fluticasone  2 spray Each Nare Daily  . heparin  5,000 Units Subcutaneous 3 times per day  . irbesartan  300 mg Oral Daily  . loratadine  10 mg Oral Daily  . nebivolol  10 mg Oral Daily  . pantoprazole  40 mg Oral BID  . tiotropium  18 mcg Inhalation Daily   . sodium chloride    . nitroGLYCERIN Stopped (10/17/13 2000)   Cath: 10/17/2013  1. Severe three-vessel coronary  disease  mid RCA - 100%, the mid to distal circumflex - 100%, subtotal of the proximal LAD (with TIMI grade 2 flow, felt to be partially due to collateralization). The RCA, CFX, and possibly the distal LAD are all supplied by collaterals from the left coronary/RI. The distal RCA, CFX, and LAD all graftable.  2. Significant left ventricular dysfunction with estimated ejection fraction 30-35% and regional wall motion abnormalities as noted above.   ASSESSMENT AND PLAN: 68 y.o. female with a past medical history significant for long-standing smoking, hyperlipidemia and hypertension, but not for diabetes mellitus or known cardiac or other vascular problems. Admitted 03/25 w/ NSTEMI, 3v dz. See abbreviated cath note above.   1. Acute MI - Late presentation.  Per Dr. Lindaann SloughNelson's note 03/29:  "Reviewed images with Dr. Excell Seltzerooper.  Low likelihood of success/ benefit from percutaneous revascularization  LAD could be revascularized, but likely there is little viability in the anterior wall/apex - completed MI.  RCA is not amenable to PCI.  LCX PCI would be very challenging due to large side branch at the location of the occlusion.  Best option remains CABG."  Pt currently refusing this, says she wants her lungs to get better.  Continue ASA, BB, ARB, statin.  2. ICM - on BB/ARB, recheck echo in 3 months, if no improvement, may need ICD. MD advise on discussing LifeVest  3. Presumed COPD - Seen by CCM, FEV1 around 34%, not responsive to beta-agonists. Need d/c med recs.  4. HTN - good control on current therapy, no doses held.  5. HL - Lipitor is new, continue, LFTs OK except small AST elevation. Follow.  Plan: cardiac rehab to see, d/c when medically stable.   Principal Problem:   Acute MI Active Problems:   HYPERLIPIDEMIA   HYPERTENSION   Myocardial infarction   COPD (chronic obstructive pulmonary disease)   Pre-op evaluation   Signed, Theodore DemarkRhonda Barrett , PA-C 8:43 AM 10/21/2013  History and all data above reviewed.  Patient examined.  I agree with the findings as above.  She wants to go home today.  She has walked and she has had no chest pain.   The patient exam reveals COR:RRR  ,  Lungs: Decreased breath sounds without wheezing  ,  Abd: Positive bowel sounds, no rebound no guarding, Ext No edema  .  All available labs, radiology testing, previous records reviewed. Agree with documented assessment and plan. CAD:  She wants medical management for now.  She understands the risk of this.  Percutaneous treatment is not an option.  Plan to discharge on meds as listed and start Imdur 30 mg daily.  We will arrange TOC follow up within the week as an outpatient.  She is committed to stopping cigarettes.  We will have pulmonary comment on discharge meds.    Fayrene FearingJames [facility]  10:47 AM   10/21/2013

## 2013-10-21 NOTE — Progress Notes (Signed)
Med reconc and discharge instructions reviewed with pt and son, in depth including indications for nebs abd how to put medication into nebulizer, patient had requested "nerve" med, PA aware but declined to prescribe at present, transported via wheelchair to private vehicle to home, Berle Mull RN

## 2013-10-21 NOTE — Discharge Instructions (Signed)
Weigh daily, report   PLEASE REMEMBER TO BRING ALL OF YOUR MEDICATIONS TO EACH OF YOUR FOLLOW-UP OFFICE VISITS.  PLEASE ATTEND ALL SCHEDULED FOLLOW-UP APPOINTMENTS.   Activity: Increase activity slowly as tolerated. You may shower, but no soaking baths (or swimming) for 1 week. No driving for 2 days. No lifting over 5 lbs for 1 week. No sexual activity for 1 week.   You May Return to Work: in 1 week (if applicable)  Wound Care: You may wash cath site gently with soap and water. Keep cath site clean and dry. If you notice pain, swelling, bleeding or pus at your cath site, please call [phone].    Cardiac Cath Site Care Refer to this sheet in the next few weeks. These instructions provide you with information on caring for yourself after your procedure. Your caregiver may also give you more specific instructions. Your treatment has been planned according to current medical practices, but problems sometimes occur. Call your caregiver if you have any problems or questions after your procedure. HOME CARE INSTRUCTIONS  You may shower 24 hours after the procedure. Remove the bandage (dressing) and gently wash the site with plain soap and water. Gently pat the site dry.   Do not apply powder or lotion to the site.   Do not sit in a bathtub, swimming pool, or whirlpool for 5 to 7 days.   No bending, squatting, or lifting anything over 10 pounds (4.5 kg) as directed by your caregiver.   Inspect the site at least twice daily.   Do not drive home if you are discharged the same day of the procedure. Have someone else drive you.   You may drive 24 hours after the procedure unless otherwise instructed by your caregiver.  What to expect:  Any bruising will usually fade within 1 to 2 weeks.   Blood that collects in the tissue (hematoma) may be painful to the touch. It should usually decrease in size and tenderness within 1 to 2 weeks.  SEEK IMMEDIATE MEDICAL CARE IF:  You have unusual pain at  the site or down the affected limb.   You have redness, warmth, swelling, or pain at the site.   You have drainage (other than a small amount of blood on the dressing).   You have chills.   You have a fever or persistent symptoms for more than 72 hours.   You have a fever and your symptoms suddenly get worse.   Your leg becomes pale, cool, tingly, or numb.   You have heavy bleeding from the site. Hold pressure on the site.  Document Released: 08/13/2010 Document Revised: 06/30/2011 Document Reviewed: 08/13/2010 [facility] Patient Information 2012 [place], [place].

## 2013-10-21 NOTE — Telephone Encounter (Signed)
Pt insurance requires neb Rx to be sent to [employer]).  Done and called them to make sure it was OK.  Medicare requires a diagnosis code on the Rx. Sent it in a second time with the Rx on it.

## 2013-10-21 NOTE — Discharge Summary (Signed)
CARDIOLOGY DISCHARGE SUMMARY   Patient ID: Beverly Wheeler MRN: [id number] DOB/AGE: Feb 13, 1946 68 y.o.  Admit date: 10/16/2013 Discharge date: 10/21/2013  PCP: Was Dr. Kriste Basque, had not seen him in 4 years. Pt aware she needs to get a Primary MD. Primary Cardiologist: Dr. Royann Shivers Pulmonologist: Has appt w/ Dr. Sherene Sires  Primary Discharge Diagnosis:    Acute MI, late presentation- medical therapy, needs CABG, wants to delay until pulmonary status improves.  Secondary Discharge Diagnosis:    HYPERLIPIDEMIA   HYPERTENSION   Myocardial infarction   COPD (chronic obstructive pulmonary disease), stage III/class D.   Pre-op evaluation  Consults: TCTS, Pulmonology  Procedures: 1. Left heart catheterization; 2. Coronary angiography; 3. Left ventriculography 4. Pre-CABG dopplers   Hospital Course: Beverly Wheeler is a 68 y.o. female with no history of CAD. She came to the hospital with recurrent chest pain. Her ECG had Q waves and residual ST elevation. She was felt to have had an OOH MI and was admitted for further evaluation and treatment.   She was initially treated with aspirin, heparin, a beta blocker and ARB and nitrates.she was pain-free on medical therapy. She was taken to the cath lab on 3/26, results below.  The cardiac catheterization showed three-vessel disease and a surgical consult was called. She was seen by Dr. Tyrone Sage who felt her respiratory status precluded surgery at this time. He recommended a pulmonary consult and PFTs.  She was seen by Dr. Wynema Birch who recommended bronchodilators and inhaled steroids as well as an anticholinergic, antihistamine and a PPI. Smoking cessation was strongly recommended and the patient stated she would not smoke again. PFTs were performed, abbreviated results below. Her respiratory function is poor and she is felt to have COPD, stage III/class D. Her respiratory status improved on the medications. She will continue these as an outpatient and a  followup appointment with pulmonary was arranged. She does not currently qualify for home O2.  The cath films were reviewed by Dr. Royann Shivers and Dr. Excell Seltzer. There is a low likelihood of success/ benefit from percutaneous revascularization. LAD could be revascularized, but likely there is little viability in the anterior wall/apex - completed MI. RCA is not amenable to PCI. LCX PCI would be very challenging due to large side branch at the location of the occlusion. Best option remains CABG. She is currently ambulating without chest pain or shortness of breath on medications including aspirin, beta blocker, statin and ARB.  She has an ischemic cardiomyopathy but is not having any signs or symptoms of heart failure. She is encouraged to weigh herself daily and is on appropriate medications, listed below. She does not currently need a diuretic.  The risks and benefits of bypass surgery were carefully discussed with the patient by cardiology, pulmonology and surgery. She did not wish to have bypass surgery immediately and wish to recover from her MI plus maximize her lung status with smoking cessation and medications. She will follow up with cardiology and with pulmonology. She can be referred back to the surgeons once she is ready to proceed. She also does not currently have a primary care physician and is encouraged to get one.  But 3/30, [name] had improved significantly. She was evaluated by Dr. Antoine Poche and all data were reviewed. She was ambulating without chest pain or shortness of breath and considered stable for discharge, to follow up as an outpatient.  Labs:  Lab Results  Component Value Date   WBC 10.2 10/19/2013   HGB 13.1  10/19/2013   HCT 36.8 10/19/2013   MCV 89.8 10/19/2013   PLT 152 10/19/2013     Recent Labs Lab 10/16/13 1550  NA 130*  K 4.6  CL 86*  CO2 30  BUN 12  CREATININE 0.66  CALCIUM 10.4  PROT 8.1  BILITOT 0.4  ALKPHOS 94  ALT 24  AST 63*  GLUCOSE 114*   Pro B  Natriuretic peptide (BNP)  Date/Time Value Ref Range Status  10/16/2013  4:20 PM 6899.0* 0 - 125 pg/mL Final   Lab Results  Component Value Date   TROPONINI 5.15* 10/16/2013   Pulmonary Functions Testing Results: PFTs 3/27: FVC: .94 liters (33%), FEV1/FVC: 68, FEV1 0.64 liters (30%)  Radiology: Dg Chest Port 1 View 10/16/2013   CLINICAL DATA:  Pain.  EXAM: PORTABLE CHEST - 1 VIEW  COMPARISON:  Chest x-ray 12/11/2009.  FINDINGS: Mediastinum and hilar structures are normal. Lungs are clear. Heart size normal. Chest is stable from prior study.  IMPRESSION: No acute abnormality.   Electronically Signed   By: Maisie Fus  Register   On: 10/16/2013 16:47   Cardiac Cath: 10/17/2013 ANGIOGRAPHIC DATA: The left main coronary artery is widely patent with distal 25% narrowing.  The left anterior descending artery is severely diseased with proximal 70% stenosis followed by a segmental 99% stenosis with TIMI grade 2 flow. The LAD is collateralized left to left..  The left circumflex artery is totally occluded after the first obtuse marginal the distal vessel is collateralized left to left. The first obtuse marginal contains an 80% stenosis.. The distal circumflex also supplies collaterals to the right coronary.  The ramus intermedius is a moderate-sized vessel that is widely patent and supplies collaterals to the LAD and distal circumflex.  The right coronary artery is totally occluded and the distal vessel is supplied by left to right collaterals. The circumflex.  LEFT VENTRICULOGRAM: Left ventricular angiogram was done in the 30 RAO projection and revealed apical akinesis/dyskinesis with moderate anterior wall hypokinesis and moderate to severe inferior wall hypokinesis LVEF 30-35%.  IMPRESSIONS: 1. Severe three-vessel coronary disease with total occlusion of the mid RCA, the mid to distal circumflex, and subtotal occlusion of the proximal LAD (with TIMI grade 2 flow, felt to be partially due to  collateralization). The right coronary, the circumflex coronary, and possibly the distal LAD are all supplied by collaterals from the left coronary/ramus intermedius. The distal right coronary, distal circumflex, and LAD all graftable.  2. Significant left ventricular dysfunction with estimated ejection fraction 30-35% and regional wall motion abnormalities as noted above.  EKG: SR, Q waves inferior leads, ST changes anterolateral leads  Pre-CABG Dopplers: 10/17/2013 Summary: BIlateral: mild soft plaque origin ICA. 1-39% ICA stenosis. Vertebral artery flow is antegrade.    FOLLOW UP PLANS AND APPOINTMENTS No Known Allergies   Medication List    STOP taking these medications       atenolol 25 MG tablet  Commonly known as:  TENORMIN      TAKE these medications       acetaminophen 500 MG tablet  Commonly known as:  TYLENOL  Take 1,000 mg by mouth every 6 (six) hours as needed for mild pain.     arformoterol 15 MCG/2ML Nebu  Commonly known as:  BROVANA  Take 2 mLs (15 mcg total) by nebulization 2 (two) times daily.     aspirin 81 MG EC tablet  Take 1 tablet (81 mg total) by mouth daily.     atorvastatin 80 MG tablet  Commonly known as:  LIPITOR  Take 1 tablet (80 mg total) by mouth daily at 6 PM.     budesonide 0.5 MG/2ML nebulizer solution  Commonly known as:  PULMICORT  Take 2 mLs (0.5 mg total) by nebulization 2 (two) times daily. 1 Box of 30, 2 ml doses     fluticasone 50 MCG/ACT nasal spray  Commonly known as:  FLONASE  Place 2 sprays into both nostrils daily.     irbesartan 300 MG tablet  Commonly known as:  AVAPRO  Take 1 tablet (300 mg total) by mouth daily.     loratadine 10 MG tablet  Commonly known as:  CLARITIN  Take 1 tablet (10 mg total) by mouth daily.     multivitamin with minerals Tabs tablet  Take 1 tablet by mouth daily.     nebivolol 10 MG tablet  Commonly known as:  BYSTOLIC  Take 1 tablet (10 mg total) by mouth daily.     nitroGLYCERIN  0.4 MG SL tablet  Commonly known as:  NITROSTAT  Place 1 tablet (0.4 mg total) under the tongue every 5 (five) minutes as needed for chest pain.     omega-3 acid ethyl esters 1 G capsule  Commonly known as:  LOVAZA  Take 1,500 g by mouth daily.     OVER THE COUNTER MEDICATION  Take 2 tablets by mouth daily. Marshmellow root     pantoprazole 40 MG tablet  Commonly known as:  PROTONIX  Take 1 tablet (40 mg total) by mouth daily.     tiotropium 18 MCG inhalation capsule  Commonly known as:  SPIRIVA  Place 1 capsule (18 mcg total) into inhaler and inhale daily.     vitamin E 400 UNIT capsule  Take 400 Units by mouth daily.        Discharge Orders   Future Appointments Provider Department Dept Phone   10/29/2013 9:00 AM Nada BoozerLaura Ingold, NP [facility] [phone]   11/04/2013 9:45 AM Nyoka CowdenMichael B Wert, MD [facility] [phone]   Future Orders Complete By Expires   Diet - low sodium heart healthy  As directed    Increase activity slowly  As directed      Follow-up Information   [facility], NP On 10/29/2013. (See for Dr. Royann Shiversroitoru at 9:00 am.)    Specialty:  Cardiology   Contact information:   [address] [place] [address] [phone]       Follow up with Sandrea HughsMichael Wert, MD On 11/04/2013. (Lung doctor. Please come at 9:30 for a 9:45 appointment.)    Specialty:  Pulmonary Disease   Contact information:   [address] [place] [address] [phone]       BRING ALL MEDICATIONS WITH YOU TO FOLLOW UP APPOINTMENTS  Time spent with patient to include physician time: 45 min Signed: Theodore Demarkhonda Barrett, PA-C 10/21/2013, 11:50 AM Co-Sign MD  Patient seen and examined.  Plan as discussed in my rounding note for today and outlined above. Rollene RotundaJames Britley Gashi  10/21/2013  2:45 PM

## 2013-10-22 NOTE — Progress Notes (Signed)
[facility] Pager: [phone] Cell: [phone] If no response, call [phone]  

## 2013-10-23 SURGERY — CORONARY ARTERY BYPASS GRAFTING (CABG)
Anesthesia: General | Site: Chest

## 2013-10-29 ENCOUNTER — Ambulatory Visit (INDEPENDENT_AMBULATORY_CARE_PROVIDER_SITE_OTHER): Payer: Medicare Other | Admitting: Cardiology

## 2013-10-29 ENCOUNTER — Encounter: Payer: Self-pay | Admitting: Cardiology

## 2013-10-29 VITALS — BP 132/68 | HR 63 | Ht 61.0 in | Wt 165.6 lb

## 2013-10-29 DIAGNOSIS — I2109 ST elevation (STEMI) myocardial infarction involving other coronary artery of anterior wall: Secondary | ICD-10-CM

## 2013-10-29 DIAGNOSIS — I2510 Atherosclerotic heart disease of native coronary artery without angina pectoris: Secondary | ICD-10-CM

## 2013-10-29 DIAGNOSIS — E785 Hyperlipidemia, unspecified: Secondary | ICD-10-CM

## 2013-10-29 DIAGNOSIS — F411 Generalized anxiety disorder: Secondary | ICD-10-CM

## 2013-10-29 DIAGNOSIS — E871 Hypo-osmolality and hyponatremia: Secondary | ICD-10-CM

## 2013-10-29 DIAGNOSIS — F419 Anxiety disorder, unspecified: Secondary | ICD-10-CM

## 2013-10-29 DIAGNOSIS — J449 Chronic obstructive pulmonary disease, unspecified: Secondary | ICD-10-CM

## 2013-10-29 DIAGNOSIS — F17200 Nicotine dependence, unspecified, uncomplicated: Secondary | ICD-10-CM

## 2013-10-29 DIAGNOSIS — I219 Acute myocardial infarction, unspecified: Secondary | ICD-10-CM

## 2013-10-29 LAB — BASIC METABOLIC PANEL WITH GFR
BUN: 13 mg/dL (ref 6–23)
CALCIUM: 9.3 mg/dL (ref 8.4–10.5)
CO2: 33 mEq/L — ABNORMAL HIGH (ref 19–32)
Chloride: 99 mEq/L (ref 96–112)
Creat: 0.77 mg/dL (ref 0.50–1.10)
GFR, Est Non African American: 81 mL/min
Glucose, Bld: 87 mg/dL (ref 70–99)
Potassium: 4.6 mEq/L (ref 3.5–5.3)
Sodium: 139 mEq/L (ref 135–145)

## 2013-10-29 MED ORDER — ALPRAZOLAM 0.25 MG PO TABS: 0.2500 mg | ORAL_TABLET | Freq: Two times a day (BID) | ORAL | Status: DC | PRN

## 2013-10-29 NOTE — Progress Notes (Signed)
wound with a swallowing pills problems yesterday 20 mEq         10/30/2013   PCP: No PCP Per Patient   Chief Complaint  Patient presents with  . Hospitalization Follow-up    Heart attack ~2 weeks ago-saw Dr C in hosp. No complaints, patient does report stress.  [name]. PCP    Would like recommendation for PCP.    Primary Cardiologist: Dr. Royann Shiversroitoru  HPI:  68 year old [facility] with chest pain she had Q waves on her EKG and residual ST elevation it was felt she had an out-of-hospital myocardial infarction first treated with aspirin, heparin, beta blocker ARBs and nitrates and was pain-free.  She went to the Cath Lab on March 26 revealing three-vessel disease.  She has significant COPD and was continuing to smoke on admission.  Pulmonary consult was obtained, PFTs were performed and her respiratory function is poor- she has stage III class D. She did improve with medication- does not qualify for home oxygen and will follow with Dr. Sherene SiresWert.  For her cardiac disease it was felt by Dr.Croitoru & Dr. Excell Seltzerooper there is low likelihood of success/benefit from percutaneous revascularization.  The LAD could be revascularized but there is little viability in the anterior wall/apex.  The RCA was not amenable to PCI, left circumflex stenosis would be challenging to do a PCI due to  large side branch at the location of the large occlusion.  Bypass grafting is the best choice for the patient. She also has ischemic cardiomyopathy but no signs of heart failure. Her weight has been stable since discharge.  Patient decided she did not wish to undergo bypass grafting until her lungs improved. She has stopped smoking and is following instructions for COPD in. She feels significantly better than in the hospital she denies chest pain or shortness of breath. She is to see Dr. Sherene SiresWert in the next one to 2 weeks.  She was seen by Dr Tyrone SageGerhardt for consult.  Plan had been once pul status improved to  proceed with CABG.  Pt today is very anxious concerning surgery.  I discussed options as listed above.  I will schedule her back to see Dr. Royann Shiversroitoru in 3-4 weeks to further discuss surgery.    No Known Allergies  Current Outpatient Prescriptions  Medication Sig Dispense Refill  . acetaminophen (TYLENOL) 500 MG tablet Take 1,000 mg by mouth every 6 (six) hours as needed for mild pain.      [name]. arformoterol (BROVANA) 15 MCG/2ML NEBU Take 2 mLs (15 mcg total) by nebulization 2 (two) times daily.  120 mL  2  . aspirin EC 81 MG EC tablet Take 1 tablet (81 mg total) by mouth daily.      [name]. atorvastatin (LIPITOR) 80 MG tablet Take 1 tablet (80 mg total) by mouth daily at 6 PM.  30 tablet  11  . budesonide (PULMICORT) 0.5 MG/2ML nebulizer solution Take 2 mLs (0.5 mg total) by nebulization 2 (two) times daily. 1 Box of 30, 2 ml doses  60 mL  6  . cetirizine (ZYRTEC) 10 MG tablet Take 10 mg by mouth daily.      . irbesartan (AVAPRO) 300 MG tablet Take 1 tablet (300 mg total) by mouth daily.  30 tablet  11  . Multiple Vitamin (MULTIVITAMIN WITH MINERALS) TABS tablet Take 1 tablet by mouth daily.      . nebivolol (BYSTOLIC) 10 MG tablet Take 1 tablet (10 mg total) by mouth daily.  30 tablet  11  . nitroGLYCERIN (NITROSTAT) 0.4 MG SL tablet Place 1 tablet (0.4 mg total) under the tongue every 5 (five) minutes as needed for chest pain.  25 tablet  3  . omega-3 acid ethyl esters (LOVAZA) 1 G capsule Take 1,500 g by mouth daily.      . pantoprazole (PROTONIX) 40 MG tablet Take 1 tablet (40 mg total) by mouth daily.  30 tablet  11  . tiotropium (SPIRIVA) 18 MCG inhalation capsule Place 1 capsule (18 mcg total) into inhaler and inhale daily.  30 capsule  12  . triamcinolone (NASACORT ALLERGY 24HR) 55 MCG/ACT AERO nasal inhaler Place 2 sprays into the nose daily.      . vitamin E 400 UNIT capsule Take 400 Units by mouth daily.      [name] ALPRAZolam (XANAX) 0.25 MG tablet Take 1 tablet (0.25 mg total) by mouth 2 (two)  times daily as needed for anxiety.  30 tablet  0  . OVER THE COUNTER MEDICATION Take 2 tablets by mouth daily. Marshmellow root       No current facility-administered medications for this visit.    Past Medical History  Diagnosis Date  . Hypertension   . COPD (chronic obstructive pulmonary disease)   . Hyperlipidemia   . Myocardial infarction 10/16/2013  . CAD (coronary artery disease)     Past Surgical History  Procedure Laterality Date  . Cardiac catheterization  10/17/13    significant 3 vessel disease    VFI:EPPIRJJ:OA colds or fevers, no weight changes Skin:no rashes or ulcers HEENT:no blurred vision, no congestion CV:see HPI PUL:see HPI GI:no diarrhea constipation or melena, no indigestion GU:no hematuria, no dysuria MS:no joint pain, no claudication Neuro:no syncope, no lightheadedness Endo:no diabetes, no thyroid disease  PHYSICAL EXAM BP 132/68  Pulse 63  Ht 5\' 1"  (1.549 m)  Wt 165 lb 9.6 oz (75.116 kg)  BMI 31.31 kg/m2 General:Pleasant affect, NAD Skin:Warm and dry, brisk capillary refill HEENT:normocephalic, sclera clear, mucus membranes moist Neck:supple, no JVD, no bruits  Heart:S1S2 RRR without murmur, gallup, rub or click Lungs:clear without rales, occ rhonchi, no wheezes CZY:SAYTK, soft, non tender, + BS, do not palpate liver spleen or masses Ext:no lower ext edema, 2+ pedal pulses, 2+ radial pulses Neuro:alert and oriented, MAE, follows commands, + facial symmetry  EKG:SR LAfB evolutionary changes of MI, no acute changes from hospital EKG.  ASSESSMENT AND PLAN Acute MI 10/16/13, OOH MI, found to have 3 vessel disease   CAD, multiple vessel Plan for treatment of her COPD and then Probable CABG, pt anxious, like to talk to Dr. Royann Shivers after her appt with Dr. Sherene Sires prior to scheduling her CABG.  CIGARETTE SMOKER, has stopped smoking Pt has stopped smoking.  COPD (chronic obstructive pulmonary disease) Treated, to see Dr. Sherene Sires for follow  up.  ANXIETY Pt with episodes of anxiety, I ordered Xanax 0.25 mg prn BID with only 1 month supply.  She was instructed to see PCP for any refills.  Given name of [facility] for PCP and we were arranging appt.  HYPERLIPIDEMIA Placed on lipitor 80 mg in hospital, will need lipid panel prior to surgery.

## 2013-10-29 NOTE — Patient Instructions (Signed)
Weigh daily Call [phone] if weight climbs more than 3 pounds in a day or 5 pounds in a week. No salt to very little salt in your diet.  No more than 2000 mg in a day. Call if increased shortness of breath or increased swelling.   Continue the good work of not smoking!!  Follow up with Dr. Royann Shivers in 3-4 weeks.  This is important to discuss surgery

## 2013-10-30 ENCOUNTER — Encounter: Payer: Self-pay | Admitting: Cardiology

## 2013-10-30 ENCOUNTER — Ambulatory Visit: Payer: Medicare Other | Admitting: Cardiology

## 2013-10-30 DIAGNOSIS — I2510 Atherosclerotic heart disease of native coronary artery without angina pectoris: Secondary | ICD-10-CM | POA: Insufficient documentation

## 2013-10-30 NOTE — Assessment & Plan Note (Signed)
Treated, to see Dr. Sherene Sires for follow up.

## 2013-10-30 NOTE — Assessment & Plan Note (Signed)
Placed on lipitor 80 mg in hospital, will need lipid panel prior to surgery.

## 2013-10-30 NOTE — Assessment & Plan Note (Signed)
Plan for treatment of her COPD and then Probable CABG, pt anxious, like to talk to Dr. Royann Shivers after her appt with Dr. Sherene Sires prior to scheduling her CABG.

## 2013-10-30 NOTE — Assessment & Plan Note (Signed)
10/16/13, OOH MI, found to have 3 vessel disease

## 2013-10-30 NOTE — Assessment & Plan Note (Signed)
Pt with episodes of anxiety, I ordered Xanax 0.25 mg prn BID with only 1 month supply.  She was instructed to see PCP for any refills.  Given name of [facility] for PCP and we were arranging appt.

## 2013-10-30 NOTE — Assessment & Plan Note (Signed)
Pt has stopped smoking 

## 2013-11-01 ENCOUNTER — Telehealth: Payer: Self-pay | Admitting: Cardiology

## 2013-11-01 NOTE — Telephone Encounter (Signed)
Faxed records to [facility] at [facility]  Patient needs PCP.

## 2013-11-04 ENCOUNTER — Encounter: Payer: Self-pay | Admitting: Internal Medicine

## 2013-11-04 ENCOUNTER — Ambulatory Visit: Payer: Medicare Other | Admitting: Physician Assistant

## 2013-11-04 ENCOUNTER — Ambulatory Visit (INDEPENDENT_AMBULATORY_CARE_PROVIDER_SITE_OTHER): Payer: Medicare Other | Admitting: Internal Medicine

## 2013-11-04 VITALS — BP 128/70 | HR 59 | Temp 97.6°F | Ht 61.0 in | Wt 167.0 lb

## 2013-11-04 DIAGNOSIS — J449 Chronic obstructive pulmonary disease, unspecified: Secondary | ICD-10-CM

## 2013-11-04 NOTE — Progress Notes (Signed)
   Subjective:    Patient ID: Beverly Wheeler, female    DOB: Aug 17, 1945  MRN: [id number]  HPI  72 yowf quit smoking 10/16/13  with hx of  of breathing problems since around 2010 referred 11/04/2013  by Dr Royann Shivers  for copd s/p admit:  Admit date: 10/16/2013  Discharge date: 10/21/2013   Primary Cardiologist: Dr. Royann Shivers  Primary Discharge Diagnosis: Acute MI, late presentation- medical therapy, needs CABG, wants to delay until pulmonary status improves.  Secondary Discharge Diagnosis:  HYPERLIPIDEMIA  HYPERTENSION  Myocardial infarction  COPD (chronic obstructive pulmonary disease), stage III/      11/04/2013 1st [facility] office visit/ Beverly Wheeler on spiriva/brovana/bud Chief Complaint  Patient presents with  . Advice Only    Referred by Dr Claudie Fisherman for COPD.  Had a heart attack about 2 weeks ago, was referred here  to review pulmonary issues from that.    doe but not using HC parking and able to do [employer] and starting to walk around the neighborhood x 7 min s stopping, also has hoarseness and sense of pnds x years day > noct s excess mucus production? Worse on spiriva dpi  No obvious other patterns in day to day or daytime variabilty or assoc chronic cough or cp or chest tightness, subjective wheeze overt sinus or hb symptoms. No unusual exp hx or h/o childhood pna/ asthma or knowledge of premature birth.  Sleeping ok without nocturnal  or early am exacerbation  of respiratory  c/o's or need for noct saba. Also denies any obvious fluctuation of symptoms with weather or environmental changes or other aggravating or alleviating factors except as outlined above   Current Medications, Allergies, Complete Past Medical History, Past Surgical History, Family History, and Social History were reviewed in [employer] record.            Review of Systems  Constitutional: Negative for fever and unexpected weight change.  HENT: Positive for postnasal drip. Negative  for congestion, dental problem, ear pain, nosebleeds, rhinorrhea, sinus pressure, sneezing, sore throat and trouble swallowing.   Eyes: Negative for redness and itching.  Respiratory: Positive for cough and shortness of breath. Negative for chest tightness and wheezing.   Cardiovascular: Negative for palpitations and leg swelling.  Gastrointestinal: Negative for nausea and vomiting.  Genitourinary: Negative for dysuria.  Musculoskeletal: Negative for joint swelling.  Skin: Negative for rash.  Neurological: Negative for headaches.  Hematological: Does not bruise/bleed easily.  Psychiatric/Behavioral: Negative for dysphoric mood. The patient is not nervous/anxious.        Objective:   Physical Exam Hoarse amb wf nad   Wt Readings from Last 3 Encounters:  11/04/13 167 lb (75.751 kg)  10/29/13 165 lb 9.6 oz (75.116 kg)  10/21/13 169 lb 5 oz (76.8 kg)      HEENT mild turbinate edema.  Oropharynx poor dentition no thrush or excess pnd or cobblestoning.  No JVD or cervical adenopathy. Mild accessory muscle hypertrophy. Trachea midline, nl thryroid. Chest was hyperinflated by percussion with diminished breath sounds and moderate increased exp time without wheeze. Hoover sign positive at mid inspiration. Regular rate and rhythm without murmur gallop or rub or increase P2 or edema.  Abd: no hsm, nl excursion. Ext warm without cyanosis or clubbing.     10/16/13 pCXR No acute abnormality.          Assessment & Plan:

## 2013-11-04 NOTE — Patient Instructions (Addendum)
Work on inhaler technique:  relax and gently blow all the way out then take a nice smooth deep breath back in  Hold for up to 5 seconds if you can.  Rinse and gargle with water when done  Please schedule a follow up office visit in 6 weeks, call sooner if needed  Late add:  Change ics/lama to q am only in anticipation of d/c next ov as this is not severe copd and needs pfts off am meds on return

## 2013-11-05 NOTE — Assessment & Plan Note (Addendum)
Spirometry 10/17/13 min airflow obstruction with FEV1 only 0.77 (36%) ration 74 p 19% better p B2  I don't believe she has all that much copd by exam, cxr or pfts but is doing better p rx with spiriva/ laba/ICS (though this is probably not needed)  Key is that she's quit smoking  The proper method of use, as well as anticipated side effects, of a metered-dose inhaler are discussed and demonstrated to the patient. Improved effectiveness after extensive coaching during this visit to a level of approximately  75% so may be a good candidate to simplify rx and just use either spiriva or symbicort on return   Rec:  No change rx, f/u in 6 weeks with full pfts

## 2013-11-06 ENCOUNTER — Telehealth: Payer: Self-pay | Admitting: *Deleted

## 2013-11-06 NOTE — Telephone Encounter (Signed)
Message copied by Christen Butter on Wed Nov 06, 2013  4:49 PM ------      Message from: Sandrea Hughs B      Created: Tue Nov 05, 2013  5:53 AM       Let her know p chart review I do not think she has that much copd and ok to d/c the pm brovana/budesonide neb to see if notes any change and if worse restart it            Also needs pfts' off of her am resp meds on return ------

## 2013-11-06 NOTE — Telephone Encounter (Signed)
Spoke with pt

## 2013-12-04 ENCOUNTER — Ambulatory Visit: Payer: Medicare Other | Admitting: Cardiovascular Disease

## 2013-12-17 ENCOUNTER — Ambulatory Visit: Payer: Medicare Other | Admitting: Internal Medicine

## 2013-12-19 ENCOUNTER — Ambulatory Visit: Payer: Medicare Other | Admitting: Internal Medicine

## 2014-01-23 ENCOUNTER — Other Ambulatory Visit: Payer: Self-pay | Admitting: Internal Medicine

## 2014-01-23 DIAGNOSIS — R0602 Shortness of breath: Secondary | ICD-10-CM

## 2014-01-27 ENCOUNTER — Ambulatory Visit (INDEPENDENT_AMBULATORY_CARE_PROVIDER_SITE_OTHER): Payer: Medicare Other | Admitting: Internal Medicine

## 2014-01-27 ENCOUNTER — Encounter: Payer: Self-pay | Admitting: Internal Medicine

## 2014-01-27 VITALS — BP 118/78 | HR 70 | Temp 97.8°F | Ht 60.0 in | Wt 175.0 lb

## 2014-01-27 DIAGNOSIS — J449 Chronic obstructive pulmonary disease, unspecified: Secondary | ICD-10-CM

## 2014-01-27 DIAGNOSIS — R0602 Shortness of breath: Secondary | ICD-10-CM

## 2014-01-27 MED ORDER — TIOTROPIUM BROMIDE MONOHYDRATE 2.5 MCG/ACT IN AERS: INHALATION_SPRAY | RESPIRATORY_TRACT | Status: DC

## 2014-01-27 MED ORDER — ALBUTEROL SULFATE HFA 108 (90 BASE) MCG/ACT IN AERS: INHALATION_SPRAY | RESPIRATORY_TRACT | Status: DC

## 2014-01-27 NOTE — Patient Instructions (Addendum)
Spiriva respimat 2 pffs daily x 2 weeks and then stop and see how you do without it (think of it like high octane fuel)   Only use your albuterol (proair- RED)  as a rescue medication to be used if you can't catch your breath by resting or doing a relaxed purse lip breathing pattern.  - The less you use it, the better it will work when you need it. - Ok to use up to 2 puffs  every 4 hours if you must but call for immediate appointment if use goes up over your usual need - Don't leave home without it !!  (think of it like the spare tire for your car)   Please remember to go to the   x-ray department downstairs for your tests - we will call you with the results when they are available.   If you are satisfied with your treatment plan,  let your doctor know and he/she can either refill your medications or you can return here when your prescription runs out.     If in any way you are not 100% satisfied,  please tell us.  If 100% better, tell your friends!  Pulmonary follow up is as needed

## 2014-01-27 NOTE — Progress Notes (Addendum)
Subjective:    Patient ID: Beverly Wheeler, female    DOB: Dec 22, 1945  MRN: [id number]  HPI  14 yowf quit smoking 10/16/13  with hx of  of breathing problems since around 2010 referred 11/04/2013  by Dr Royann Shivers  for copd s/p admit as below but no airflow obst by pfts 01/27/14   Admit date: 10/16/2013  Discharge date: 10/21/2013   Primary Cardiologist: Dr. Royann Shivers  Primary Discharge Diagnosis: Acute MI, late presentation- medical therapy, needs CABG, wants to delay until pulmonary status improves.  Secondary Discharge Diagnosis:  HYPERLIPIDEMIA  HYPERTENSION  Myocardial infarction  COPD (chronic obstructive pulmonary disease), stage III/      11/04/2013 1st [facility] office visit/ Beverly Wheeler on spiriva/brovana/bud Chief Complaint  Patient presents with  . Advice Only    Referred by Dr Claudie Fisherman for COPD.  Had a heart attack about 2 weeks ago, was referred here  to review pulmonary issues from that.    doe but not using HC parking and able to do [employer] and starting to walk around the neighborhood x 7 min s stopping, also has hoarseness and sense of pnds x years day > noct s excess mucus production? Worse on spiriva dpi rec Work on inhaler technique:   Late add:  Change ics/lama to q am only in anticipation of d/c next ov as this is not severe copd and needs pfts off am meds on return  01/27/2014 f/u ov/Beverly Wheeler re: sob with no airfow obst by pfts on spirva only plus ppi qam  Chief Complaint  Patient presents with  . Followup with PFT    Pt states her breathing is overall doing well. She c/o cough that she relates to PND- started a few days ago and is non prod.     No obvious day to day or daytime variabilty or assoc chronic cough or cp or chest tightness, subjective wheeze overt sinus or hb symptoms. No unusual exp hx or h/o childhood pna/ asthma or knowledge of premature birth.  Sleeping ok without nocturnal  or early am exacerbation  of respiratory  c/o's or need for noct saba. Also  denies any obvious fluctuation of symptoms with weather or environmental changes or other aggravating or alleviating factors except as outlined above   Current Medications, Allergies, Complete Past Medical History, Past Surgical History, Family History, and Social History were reviewed in [employer] record.  ROS  The following are not active complaints unless bolded sore throat, dysphagia, dental problems, itching, sneezing,  nasal congestion or excess/ purulent secretions, ear ache,   fever, chills, sweats, unintended wt loss, pleuritic or exertional cp, hemoptysis,  orthopnea pnd or leg swelling, presyncope, palpitations, heartburn, abdominal pain, anorexia, nausea, vomiting, diarrhea  or change in bowel or urinary habits, change in stools or urine, dysuria,hematuria,  rash, arthralgias, visual complaints, headache, numbness weakness or ataxia or problems with walking or coordination,  change in mood/affect or memory.                Objective:   Physical Exam  Min Hoarse amb wf nad   01/28/2014          175  Wt Readings from Last 3 Encounters:  11/04/13 167 lb (75.751 kg)  10/29/13 165 lb 9.6 oz (75.116 kg)  10/21/13 169 lb 5 oz (76.8 kg)      HEENT mild turbinate edema.  Poor dentition/ Oropharynx no thrush or excess pnd or cobblestoning.  No JVD or cervical adenopathy. Mild accessory muscle  hypertrophy. Trachea midline, nl thryroid. Chest was hyperinflated by percussion with diminished breath sounds and moderate increased exp time without wheeze. Hoover sign positive at mid inspiration. Regular rate and rhythm without murmur gallop or rub or increase P2 or edema.  Abd: no hsm, nl excursion. Ext warm without cyanosis or clubbing.       CXR  01/27/2014 : There is no edema or consolidation. Heart size and pulmonary vascularity are normal. No adenopathy. There is degenerative change in the thoracic spine. There is atherosclerotic change in the aortic arch  region.            Assessment & Plan:

## 2014-01-27 NOTE — Progress Notes (Signed)
PFT done today. 

## 2014-01-28 ENCOUNTER — Ambulatory Visit (INDEPENDENT_AMBULATORY_CARE_PROVIDER_SITE_OTHER)
Admission: RE | Admit: 2014-01-28 | Discharge: 2014-01-28 | Disposition: A | Discharge status: Home / Self Care | Payer: Medicare Other | Source: Ambulatory Visit | Attending: Internal Medicine | Admitting: Internal Medicine

## 2014-01-28 DIAGNOSIS — J449 Chronic obstructive pulmonary disease, unspecified: Secondary | ICD-10-CM

## 2014-01-28 NOTE — Progress Notes (Signed)
Quick Note:  Spoke with pt and notified of results per Dr. Wert. Pt verbalized understanding and denied any questions.  ______ 

## 2014-01-28 NOTE — Assessment & Plan Note (Addendum)
Spirometry 10/17/13 min airflow obstruction with FEV1 only 0.77 (36%) ration 74 p 19% better p B2 - PFT's 01/27/2014   1.11 (54%) ratio 75  (FEV1/VC = 59%) and no change p saba after DLCO 72% p quit smoking march 2015   Restrictive changes predomiinate  c/w obesity, reviewed  Her cough may just be a side effect of dpi spiriva, ok to use the respimat x 2 weeks then just try off it and see what difference if any this makes  The proper method of use, as well as anticipated side effects, of a metered-dose inhaler are discussed and demonstrated to the patient. Improved effectiveness after extensive coaching during this visit to a level of approximately  90%

## 2014-02-06 ENCOUNTER — Encounter: Payer: Self-pay | Admitting: Cardiovascular Disease

## 2014-02-06 ENCOUNTER — Ambulatory Visit (INDEPENDENT_AMBULATORY_CARE_PROVIDER_SITE_OTHER): Payer: Medicare Other | Admitting: Cardiovascular Disease

## 2014-02-06 VITALS — BP 146/72 | HR 67 | Resp 20 | Ht 61.0 in | Wt 175.1 lb

## 2014-02-06 DIAGNOSIS — Z79899 Other long term (current) drug therapy: Secondary | ICD-10-CM

## 2014-02-06 DIAGNOSIS — I10 Essential (primary) hypertension: Secondary | ICD-10-CM

## 2014-02-06 DIAGNOSIS — E782 Mixed hyperlipidemia: Secondary | ICD-10-CM

## 2014-02-06 DIAGNOSIS — R5381 Other malaise: Secondary | ICD-10-CM

## 2014-02-06 DIAGNOSIS — R5383 Other fatigue: Secondary | ICD-10-CM

## 2014-02-06 DIAGNOSIS — R0602 Shortness of breath: Secondary | ICD-10-CM

## 2014-02-06 DIAGNOSIS — J449 Chronic obstructive pulmonary disease, unspecified: Secondary | ICD-10-CM

## 2014-02-06 DIAGNOSIS — I2510 Atherosclerotic heart disease of native coronary artery without angina pectoris: Secondary | ICD-10-CM

## 2014-02-06 DIAGNOSIS — E785 Hyperlipidemia, unspecified: Secondary | ICD-10-CM

## 2014-02-06 NOTE — Patient Instructions (Signed)
Your physician recommends that you return for lab work in: FASTING at your convenience at [facility].  Your physician has requested that you have an echocardiogram. Echocardiography is a painless test that uses sound waves to create images of your heart. It provides your doctor with information about the size and shape of your heart and how well your heart's chambers and valves are working. This procedure takes approximately one hour. There are no restrictions for this procedure.  Dr. Royann Shivers recommends that you schedule a follow-up appointment in: 5 months before the end of the year.

## 2014-02-08 ENCOUNTER — Encounter: Payer: Self-pay | Admitting: Cardiovascular Disease

## 2014-02-08 NOTE — Assessment & Plan Note (Signed)
The patient remains terrified about potential respiratory complications with bypass surgery. Definitely the risk is trivial. I am however encouraged by the fact that there has been substantial improvement in her ventilatory function (although her FEV1 remains markedly depressed at 1.1 L per minute). I do not think the risk of respiratory complications is prohibitive. I would like to know Dr. Thurston Hole opinion specifically about this. Unfortunately, percutaneous revascularization is not a good option for her. At least it is typically, she has a high likelihood of poor outcome with medical therapy in the setting of multivessel CAD and depressed LVEF

## 2014-02-08 NOTE — Assessment & Plan Note (Signed)
At this point, per Dr. Thurston Hole notes, restrictive abnormalities appear to be the dominant problem. She has no wheezing. She has quit smoking completely.

## 2014-02-08 NOTE — Assessment & Plan Note (Signed)
It is time to repeat her lipid profile on statin therapy

## 2014-02-08 NOTE — Assessment & Plan Note (Signed)
Her blood pressure was a little high today, but she is quite nervous, as she is whenever there is a discussion about bypass surgery. At home her blood pressures 120s/60s.

## 2014-02-08 NOTE — Progress Notes (Signed)
Patient ID: Beverly Wheeler, female   DOB: 1945/11/04, 68 y.o.   MRN: [id number]       Reason for office visit Multivessel CAD  Beverly Wheeler presented late after an out-of-hospital ST elevation myocardial infarction in March 2015. She underwent cardiac catheterization which showed three-vessel disease significant COPD and the patient is very reluctant to undergo bypass.  There is low likelihood of success/benefit from percutaneous revascularization. The LAD could be revascularized but there is little viability in the anterior wall/apex. The RCA was not amenable to PCI, left circumflex stenosis would be challenging to do a PCI due to large side branch at the location of the large occlusion. Bypass grafting is the best choice for the patient. She also has ischemic cardiomyopathy but no signs of heart failure.   Seeing Beverly Wheeler, recent PFT's showed improvement in FEV1 10/17/13 FEV1 0.77 (36%),  01/27/2014 FEV1 1.11 (54%) ratio 75 (FEV1/VC = 59%) and no change p saba after DLCO 72% p quit smoking march 2015 "Restrictive changes predomiinate c/w obesity".  NYHA class II exertional dyspnea, but no angina. Her arthritis is more of a limiting factor than her dyspnea.  Has difficulty swallowing pills and wants to have a barium swallow.    No Known Allergies  Current Outpatient Prescriptions  Medication Sig Dispense Refill  . acetaminophen (TYLENOL) 500 MG tablet Take 1,000 mg by mouth every 6 (six) hours as needed for mild pain.      [name] albuterol (PROAIR HFA) 108 (90 BASE) MCG/ACT inhaler 2 puffs every 4 hours as needed only  if your can't catch your breath  1 Inhaler  2  . ALPRAZolam (XANAX) 0.25 MG tablet Take 1 tablet (0.25 mg total) by mouth 2 (two) times daily as needed for anxiety.  30 tablet  0  . aspirin EC 81 MG EC tablet Take 1 tablet (81 mg total) by mouth daily.      [name] atorvastatin (LIPITOR) 80 MG tablet Take 1 tablet (80 mg total) by mouth daily at 6 PM.  30 tablet  11  . cetirizine  (ZYRTEC) 10 MG tablet Take 10 mg by mouth daily.      . irbesartan (AVAPRO) 300 MG tablet Take 1 tablet (300 mg total) by mouth daily.  30 tablet  11  . Multiple Vitamin (MULTIVITAMIN WITH MINERALS) TABS tablet Take 1 tablet by mouth daily.      . nebivolol (BYSTOLIC) 10 MG tablet Take 1 tablet (10 mg total) by mouth daily.  30 tablet  11  . nitroGLYCERIN (NITROSTAT) 0.4 MG SL tablet Place 1 tablet (0.4 mg total) under the tongue every 5 (five) minutes as needed for chest pain.  25 tablet  3  . omega-3 acid ethyl esters (LOVAZA) 1 G capsule Take 1,500 g by mouth daily.      . pantoprazole (PROTONIX) 40 MG tablet Take 40 mg by mouth daily.      . Tiotropium Bromide Monohydrate (SPIRIVA RESPIMAT) 2.5 MCG/ACT AERS 2 puffs each am  1 Inhaler  11  . triamcinolone (NASACORT ALLERGY 24HR) 55 MCG/ACT AERO nasal inhaler Place 2 sprays into the nose daily.      . vitamin E 400 UNIT capsule Take 400 Units by mouth daily.       No current facility-administered medications for this visit.    Past Medical History  Diagnosis Date  . Hypertension   . COPD (chronic obstructive pulmonary disease)   . Hyperlipidemia   . Myocardial infarction 10/16/2013  . CAD (coronary  artery disease)     Past Surgical History  Procedure Laterality Date  . Cardiac catheterization  10/17/13    significant 3 vessel disease    Family History  Problem Relation Age of Onset  . Allergies Mother   . Heart disease Father   . Heart disease Brother   . Rheum arthritis Brother   . Breast cancer Mother     History   Social History  . Marital Status: Married    Spouse Name: N/A    Number of Children: N/A  . Years of Education: N/A   Occupational History  . Not on file.   Social History Main Topics  . Smoking status: Former Smoker -- 1.00 packs/day for 45 years    Quit date: 10/16/2013  . Smokeless tobacco: Never Used  . Alcohol Use: Yes     Comment: occasional  . Drug Use: No  . Sexual Activity: Not Currently     Other Topics Concern  . Not on file   Social History Narrative  . No narrative on file    Review of systems: NYHA functional class II dyspnea on exertion (sweeping, mopping, but not showering or getting dressed) The patient specifically denies any chest pain at rest or with exertion, dyspnea at rest, orthopnea, paroxysmal nocturnal dyspnea, syncope, palpitations, focal neurological deficits, intermittent claudication, lower extremity edema, unexplained weight gain, cough, hemoptysis or wheezing.  The patient also denies abdominal pain, nausea, vomiting, diarrhea, constipation, polyuria, polydipsia, dysuria, hematuria, frequency, urgency, abnormal bleeding or bruising, fever, chills, unexpected weight changes, mood swings, change in skin or hair texture, change in voice quality, auditory or visual problems, allergic reactions or rashes, new musculoskeletal complaints other than usual "aches and pains".   PHYSICAL EXAM BP 146/72  Pulse 67  Resp 20  Ht 5\' 1"  (1.549 m)  Wt 175 lb 1.6 oz (79.425 kg)  BMI 33.10 kg/m2  General: Alert, oriented x3, no distress Head: no evidence of trauma, PERRL, EOMI, no exophtalmos or lid lag, no myxedema, no xanthelasma; normal ears, nose and oropharynx Neck: normal jugular venous pulsations and no hepatojugular reflux; brisk carotid pulses without delay and no carotid bruits Chest: clear to auscultation, no signs of consolidation by percussion or palpation, normal fremitus, symmetrical and full respiratory excursions Cardiovascular: normal position and quality of the apical impulse, regular rhythm, normal first and second heart sounds, no murmurs, rubs or gallops Abdomen: no tenderness or distention, no masses by palpation, no abnormal pulsatility or arterial bruits, normal bowel sounds, no hepatosplenomegaly Extremities: no clubbing, cyanosis or edema; 2+ radial, ulnar and brachial pulses bilaterally; 2+ right femoral, posterior tibial and dorsalis pedis  pulses; 2+ left femoral, posterior tibial and dorsalis pedis pulses; no subclavian or femoral bruits Neurological: grossly nonfocal   EKG: NSR, LAD, old inferior and lateral MI  Lipid Panel     Component Value Date/Time   CHOL 255* 12/11/2009 0755   TRIG 501.0* 12/11/2009 0755   HDL 36.10* 12/11/2009 0755   CHOLHDL 7 12/11/2009 0755   VLDL 100.2* 12/11/2009 0755    BMET    Component Value Date/Time   NA 139 10/29/2013 0952   K 4.6 10/29/2013 0952   CL 99 10/29/2013 0952   CO2 33* 10/29/2013 0952   GLUCOSE 87 10/29/2013 0952   BUN 13 10/29/2013 0952   CREATININE 0.77 10/29/2013 0952   CREATININE 0.66 10/16/2013 1550   CALCIUM 9.3 10/29/2013 0952   GFRNONAA 81 10/29/2013 0952   GFRNONAA >90 10/16/2013 1550   GFRAA >  89 10/29/2013 0952   GFRAA >90 10/16/2013 1550     ASSESSMENT AND PLAN CAD, multiple vessel The patient remains terrified about potential respiratory complications with bypass surgery. Definitely the risk is not trivial. I am  encouraged by the fact that there has been substantial improvement in her ventilatory function (although her FEV1 remains markedly depressed at 1.1 L per minute). I do not think the risk of respiratory complications is prohibitive. I would like to know Dr. Thurston Hole opinion specifically about this. Unfortunately, percutaneous revascularization is not a good option for her. At least it is typically, she has a high likelihood of poor outcome with medical therapy in the setting of multivessel CAD and depressed LVEF.  We'll repeat an echo to see where her LVEF is now, while on treatment. I am not very optimistic about seeing a marked improvement since she clearly has evidence of completed infarction by EKG.  COPD  GOLD II based on fev1/VC of 58%  At this point, per Dr. Thurston Hole notes, restrictive abnormalities appear to be the dominant problem. She has no wheezing. She has quit smoking completely.  HYPERTENSION Her blood pressure was a little high today, but she is quite  nervous, as she is whenever there is a discussion about bypass surgery. At home her blood pressures 120s/60s.  HYPERLIPIDEMIA It is time to repeat her lipid profile on statin therapy   Orders Placed This Encounter  Procedures  . CBC  . Comprehensive metabolic panel  . Lipid panel  . 2D Echocardiogram without contrast   No orders of the defined types were placed in this encounter.    Junious Silk, MD, [facility] [phone] office [phone] pager

## 2014-02-09 LAB — PULMONARY FUNCTION TEST
DL/VA % pred: 99 %
DL/VA: 4.24 ml/min/mmHg/L
DLCO UNC % PRED: 72 %
DLCO UNC: 13.7 ml/min/mmHg
FEF 25-75 POST: 1.12 L/s
FEF 25-75 Pre: 0.81 L/sec
FEF2575-%Change-Post: 38 %
FEF2575-%Pred-Post: 59 %
FEF2575-%Pred-Pre: 43 %
FEV1-%Change-Post: 7 %
FEV1-%PRED-POST: 58 %
FEV1-%Pred-Pre: 54 %
FEV1-POST: 1.19 L
FEV1-PRE: 1.11 L
FEV1FVC-%CHANGE-POST: 4 %
FEV1FVC-%PRED-PRE: 97 %
FEV6-%Change-Post: 2 %
FEV6-%PRED-PRE: 57 %
FEV6-%Pred-Post: 58 %
FEV6-Post: 1.5 L
FEV6-Pre: 1.46 L
FEV6FVC-%Change-Post: 1 %
FEV6FVC-%Pred-Post: 104 %
FEV6FVC-%Pred-Pre: 102 %
FVC-%Change-Post: 2 %
FVC-%Pred-Post: 57 %
FVC-%Pred-Pre: 55 %
FVC-PRE: 1.48 L
FVC-Post: 1.52 L
PRE FEV1/FVC RATIO: 75 %
PRE FEV6/FVC RATIO: 99 %
Post FEV1/FVC ratio: 78 %
Post FEV6/FVC ratio: 100 %

## 2014-02-10 ENCOUNTER — Telehealth: Payer: Self-pay | Admitting: Internal Medicine

## 2014-02-10 MED ORDER — TIOTROPIUM BROMIDE MONOHYDRATE 2.5 MCG/ACT IN AERS: INHALATION_SPRAY | RESPIRATORY_TRACT | Status: DC

## 2014-02-10 MED ORDER — ALBUTEROL SULFATE HFA 108 (90 BASE) MCG/ACT IN AERS: INHALATION_SPRAY | RESPIRATORY_TRACT | Status: DC

## 2014-02-10 NOTE — Telephone Encounter (Signed)
Pt was seen by MW on 7/6 and she was given rx for the spiriva respimat and the albuterol HFA.  She stated that she has lost these rx and needs these sent to her pharmacy.  This has been done and the pt is aware. Nothing further is needed.

## 2014-02-12 ENCOUNTER — Ambulatory Visit (HOSPITAL_COMMUNITY): Payer: Medicare Other

## 2014-02-20 LAB — LIPID PANEL
CHOL/HDL RATIO: 4.8 ratio
Cholesterol: 154 mg/dL (ref 0–200)
HDL: 32 mg/dL — ABNORMAL LOW (ref >39)
LDL Cholesterol: 63 mg/dL (ref 0–99)
Triglycerides: 297 mg/dL — ABNORMAL HIGH (ref <150)
VLDL: 59 mg/dL — AB (ref 0–40)

## 2014-02-20 LAB — COMPREHENSIVE METABOLIC PANEL
ALT: 19 U/L (ref 0–35)
AST: 17 U/L (ref 0–37)
Albumin: 4.2 g/dL (ref 3.5–5.2)
Alkaline Phosphatase: 82 U/L (ref 39–117)
BILIRUBIN TOTAL: 0.4 mg/dL (ref 0.2–1.2)
BUN: 19 mg/dL (ref 6–23)
CO2: 31 mEq/L (ref 19–32)
CREATININE: 0.94 mg/dL (ref 0.50–1.10)
Calcium: 9.5 mg/dL (ref 8.4–10.5)
Chloride: 97 mEq/L (ref 96–112)
Glucose, Bld: 103 mg/dL — ABNORMAL HIGH (ref 70–99)
Potassium: 4.4 mEq/L (ref 3.5–5.3)
Sodium: 138 mEq/L (ref 135–145)
Total Protein: 7 g/dL (ref 6.0–8.3)

## 2014-02-20 LAB — CBC
HCT: 38.8 % (ref 36.0–46.0)
Hemoglobin: 14.1 g/dL (ref 12.0–15.0)
MCH: 32.7 pg (ref 26.0–34.0)
MCHC: 36.3 g/dL — AB (ref 30.0–36.0)
MCV: 90 fL (ref 78.0–100.0)
Platelets: 255 10*3/uL (ref 150–400)
RBC: 4.31 MIL/uL (ref 3.87–5.11)
RDW: 12.9 % (ref 11.5–15.5)
WBC: 12.5 10*3/uL — ABNORMAL HIGH (ref 4.0–10.5)

## 2014-02-21 ENCOUNTER — Other Ambulatory Visit: Payer: Self-pay | Admitting: Cardiovascular Disease

## 2014-02-21 ENCOUNTER — Telehealth: Payer: Self-pay | Admitting: *Deleted

## 2014-02-21 DIAGNOSIS — E785 Hyperlipidemia, unspecified: Secondary | ICD-10-CM

## 2014-02-21 MED ORDER — FENOFIBRATE 48 MG PO TABS: 48.0000 mg | ORAL_TABLET | Freq: Every day | ORAL | Status: DC

## 2014-02-21 NOTE — Telephone Encounter (Signed)
Message copied by Barrie Dunker on Fri Feb 21, 2014  8:29 AM ------      Message from: Thurmon Fair      Created: Fri Feb 21, 2014  8:02 AM       Cholesterol and LDL look great, but triglycerides are high. Would like to add fenofibrate (Tricor 48 mg) and recheck lipids and liver tests in 3 months. Target triglycerides less than 150. Hopefully, HDL (good) cholesterol) will come up, too ------

## 2014-02-21 NOTE — Telephone Encounter (Signed)
Spoke with Beverly Wheeler about her bloodwork and told her she need to start fenofribrate 48 mg and recheck her lipid profile in 3 months. Mail lad slip to patient home to get lab work done in 3 Months

## 2014-02-24 ENCOUNTER — Inpatient Hospital Stay (HOSPITAL_COMMUNITY): Admission: RE | Admit: 2014-02-24 | Payer: Medicare Other | Source: Ambulatory Visit

## 2014-03-10 ENCOUNTER — Ambulatory Visit (HOSPITAL_COMMUNITY)
Admission: RE | Admit: 2014-03-10 | Discharge: 2014-03-10 | Disposition: A | Discharge status: Home / Self Care | Payer: Medicare Other | Source: Ambulatory Visit | Attending: Cardiology | Admitting: Cardiology

## 2014-03-10 DIAGNOSIS — R0602 Shortness of breath: Secondary | ICD-10-CM

## 2014-03-10 DIAGNOSIS — I359 Nonrheumatic aortic valve disorder, unspecified: Secondary | ICD-10-CM | POA: Diagnosis present

## 2014-03-10 DIAGNOSIS — I059 Rheumatic mitral valve disease, unspecified: Secondary | ICD-10-CM

## 2014-03-10 DIAGNOSIS — I080 Rheumatic disorders of both mitral and aortic valves: Secondary | ICD-10-CM | POA: Insufficient documentation

## 2014-03-10 NOTE — Progress Notes (Signed)
2D Echo Performed 03/10/2014    Beverly Wheeler, RCS  

## 2014-07-03 ENCOUNTER — Encounter (HOSPITAL_COMMUNITY): Payer: Self-pay | Admitting: Interventional Cardiology

## 2014-07-28 ENCOUNTER — Ambulatory Visit: Payer: Medicare Other | Admitting: Cardiovascular Disease

## 2014-09-12 ENCOUNTER — Other Ambulatory Visit: Payer: Self-pay | Admitting: Physician Assistant

## 2014-09-12 ENCOUNTER — Other Ambulatory Visit: Payer: Self-pay | Admitting: Cardiovascular Disease

## 2014-09-12 DIAGNOSIS — E785 Hyperlipidemia, unspecified: Secondary | ICD-10-CM | POA: Diagnosis not present

## 2014-09-12 LAB — LIPID PANEL
Cholesterol: 165 mg/dL (ref 0–200)
HDL: 32 mg/dL — ABNORMAL LOW (ref >39)
LDL Cholesterol: 92 mg/dL (ref 0–99)
TRIGLYCERIDES: 204 mg/dL — AB (ref <150)
Total CHOL/HDL Ratio: 5.2 Ratio
VLDL: 41 mg/dL — ABNORMAL HIGH (ref 0–40)

## 2014-09-12 NOTE — Telephone Encounter (Signed)
Rx(s) sent to pharmacy electronically.  

## 2014-09-15 ENCOUNTER — Ambulatory Visit (INDEPENDENT_AMBULATORY_CARE_PROVIDER_SITE_OTHER): Payer: Medicare Other | Admitting: Cardiovascular Disease

## 2014-09-15 ENCOUNTER — Encounter: Payer: Self-pay | Admitting: Cardiovascular Disease

## 2014-09-15 VITALS — BP 142/74 | HR 71 | Resp 16 | Ht 61.0 in | Wt 176.1 lb

## 2014-09-15 DIAGNOSIS — E785 Hyperlipidemia, unspecified: Secondary | ICD-10-CM

## 2014-09-15 DIAGNOSIS — E782 Mixed hyperlipidemia: Secondary | ICD-10-CM

## 2014-09-15 DIAGNOSIS — Z79899 Other long term (current) drug therapy: Secondary | ICD-10-CM

## 2014-09-15 DIAGNOSIS — I10 Essential (primary) hypertension: Secondary | ICD-10-CM | POA: Diagnosis not present

## 2014-09-15 DIAGNOSIS — I2510 Atherosclerotic heart disease of native coronary artery without angina pectoris: Secondary | ICD-10-CM | POA: Diagnosis not present

## 2014-09-15 NOTE — Patient Instructions (Signed)
Dr. Royann Shivers recommends that you schedule a follow-up appointment in: 6 months  Your physician recommends that you return for lab work in: FASTING prior to your 6 month visit.  Fat and Cholesterol Control Diet Fat and cholesterol levels in your blood and organs are influenced by your diet. High levels of fat and cholesterol may lead to diseases of the heart, small and large blood vessels, gallbladder, liver, and pancreas. CONTROLLING FAT AND CHOLESTEROL WITH DIET Although exercise and lifestyle factors are important, your diet is key. That is because certain foods are known to raise cholesterol and others to lower it. The goal is to balance foods for their effect on cholesterol and more importantly, to replace saturated and trans fat with other types of fat, such as monounsaturated fat, polyunsaturated fat, and omega-3 fatty acids. On average, a person should consume no more than 15 to 17 g of saturated fat daily. Saturated and trans fats are considered "bad" fats, and they will raise LDL cholesterol. Saturated fats are primarily found in animal products such as meats, butter, and cream. However, that does not mean you need to give up all your favorite foods. Today, there are good tasting, low-fat, low-cholesterol substitutes for most of the things you like to eat. Choose low-fat or nonfat alternatives. Choose round or loin cuts of red meat. These types of cuts are lowest in fat and cholesterol. Chicken (without the skin), fish, veal, and ground [place] breast are great choices. Eliminate fatty meats, such as hot dogs and salami. Even shellfish have little or no saturated fat. Have a 3 oz (85 g) portion when you eat lean meat, poultry, or fish. Trans fats are also called "partially hydrogenated oils." They are oils that have been scientifically manipulated so that they are solid at room temperature resulting in a longer shelf life and improved taste and texture of foods in which they are added. Trans fats  are found in stick margarine, some tub margarines, cookies, crackers, and baked goods.  When baking and cooking, oils are a great substitute for butter. The monounsaturated oils are especially beneficial since it is believed they lower LDL and raise HDL. The oils you should avoid entirely are saturated tropical oils, such as coconut and palm.  Remember to eat a lot from food groups that are naturally free of saturated and trans fat, including fish, fruit, vegetables, beans, grains (barley, rice, couscous, bulgur wheat), and pasta (without cream sauces).  IDENTIFYING FOODS THAT LOWER FAT AND CHOLESTEROL  Soluble fiber may lower your cholesterol. This type of fiber is found in fruits such as apples, vegetables such as broccoli, potatoes, and carrots, legumes such as beans, peas, and lentils, and grains such as barley. Foods fortified with plant sterols (phytosterol) may also lower cholesterol. You should eat at least 2 g per day of these foods for a cholesterol lowering effect.  Read package labels to identify low-saturated fats, trans fat free, and low-fat foods at the supermarket. Select cheeses that have only 2 to 3 g saturated fat per ounce. Use a heart-healthy tub margarine that is free of trans fats or partially hydrogenated oil. When buying baked goods (cookies, crackers), avoid partially hydrogenated oils. Breads and muffins should be made from whole grains (whole-wheat or whole oat flour, instead of "flour" or "enriched flour"). Buy non-creamy canned soups with reduced salt and no added fats.  FOOD PREPARATION TECHNIQUES  Never deep-fry. If you must fry, either stir-fry, which uses very little fat, or use non-stick cooking sprays. When  possible, broil, bake, or roast meats, and steam vegetables. Instead of putting butter or margarine on vegetables, use lemon and herbs, applesauce, and cinnamon (for squash and sweet potatoes). Use nonfat yogurt, salsa, and low-fat dressings for salads.  LOW-SATURATED  FAT / LOW-FAT FOOD SUBSTITUTES Meats / Saturated Fat (g)  Avoid: Steak, marbled (3 oz/85 g) / 11 g  Choose: Steak, lean (3 oz/85 g) / 4 g  Avoid: Hamburger (3 oz/85 g) / 7 g  Choose: Hamburger, lean (3 oz/85 g) / 5 g  Avoid: Ham (3 oz/85 g) / 6 g  Choose: Ham, lean cut (3 oz/85 g) / 2.4 g  Avoid: Chicken, with skin, dark meat (3 oz/85 g) / 4 g  Choose: Chicken, skin removed, dark meat (3 oz/85 g) / 2 g  Avoid: Chicken, with skin, light meat (3 oz/85 g) / 2.5 g  Choose: Chicken, skin removed, light meat (3 oz/85 g) / 1 g Dairy / Saturated Fat (g)  Avoid: Whole milk (1 cup) / 5 g  Choose: Low-fat milk, 2% (1 cup) / 3 g  Choose: Low-fat milk, 1% (1 cup) / 1.5 g  Choose: Skim milk (1 cup) / 0.3 g  Avoid: Hard cheese (1 oz/28 g) / 6 g  Choose: Skim milk cheese (1 oz/28 g) / 2 to 3 g  Avoid: Cottage cheese, 4% fat (1 cup) / 6.5 g  Choose: Low-fat cottage cheese, 1% fat (1 cup) / 1.5 g  Avoid: Ice cream (1 cup) / 9 g  Choose: Sherbet (1 cup) / 2.5 g  Choose: Nonfat frozen yogurt (1 cup) / 0.3 g  Choose: Frozen fruit bar / trace  Avoid: Whipped cream (1 tbs) / 3.5 g  Choose: Nondairy whipped topping (1 tbs) / 1 g Condiments / Saturated Fat (g)  Avoid: Mayonnaise (1 tbs) / 2 g  Choose: Low-fat mayonnaise (1 tbs) / 1 g  Avoid: Butter (1 tbs) / 7 g  Choose: Extra light margarine (1 tbs) / 1 g  Avoid: Coconut oil (1 tbs) / 11.8 g  Choose: Olive oil (1 tbs) / 1.8 g  Choose: Corn oil (1 tbs) / 1.7 g  Choose: Safflower oil (1 tbs) / 1.2 g  Choose: Sunflower oil (1 tbs) / 1.4 g  Choose: Soybean oil (1 tbs) / 2.4 g  Choose: Canola oil (1 tbs) / 1 g Document Released: 07/11/2005 Document Revised: 11/05/2012 Document Reviewed: 10/09/2013 Exit[employer] Patient Information 2015 [place], [place]. This information is not intended to replace advice given to you by your health care provider. Make sure you discuss any questions you have with your health care  provider.

## 2014-09-15 NOTE — Progress Notes (Signed)
Patient ID: Beverly Wheeler, female   DOB: [birth year], 69 y.o.   MRN: [id number]     Reason for office visit Multivessel CAD, ischemic cardiomyopathy  Beverly Wheeler feels well. She is not smoking. She walks regularly inside [employer] or [employer]. She is limited by back pain and hip pain more than dyspnea. She does not have angina. Her lipids are fair and her TG have dropped to 201, but her LDL has worsened (from 63 to 94, target <70).  BP is a little high today, but she has been checking it regularly and it has been lower [facility]. 137/60 at [facility] last week.  Follow up echo on medical therapy, last August 2015 showed a remarkable and much better than expected recovery in LV function (EF 55-60% with residual apical lateral hypokinesis), but significant residual diastolic dysfunction.  Beverly Wheeler presented late after an out-of-hospital ST elevation myocardial infarction in March 2015. She underwent cardiac catheterization which showed three-vessel disease significant COPD and the patient is very reluctant to undergo bypass.  There is low likelihood of success/benefit from percutaneous revascularization. The LAD could be revascularized but there is little viability in the anterior wall/apex. The RCA was not amenable to PCI, left circumflex stenosis would be challenging to do a PCI due to large side branch at the location of the large occlusion. Bypass grafting is the best choice for the patient. She also has ischemic cardiomyopathy but no signs of heart failure.   Seeing Dr. Sherene Sires, recent PFT's showed improvement in FEV1 10/17/13 FEV1 0.77 (36%), 01/27/2014 FEV1 1.11 (54%) ratio 75 (FEV1/VC = 59%) and no change p saba after DLCO 72% p quit smoking march 2015 "Restrictive changes predomiinate c/w obesity".   No Known Allergies  Current Outpatient Prescriptions  Medication Sig Dispense Refill  . acetaminophen (TYLENOL) 500 MG tablet Take 1,000 mg by mouth every 6 (six) hours as needed  for mild pain.    [name] albuterol (PROAIR HFA) 108 (90 BASE) MCG/ACT inhaler 2 puffs every 4 hours as needed only  if your can't catch your breath 1 Inhaler 2  . ALPRAZolam (XANAX) 0.25 MG tablet Take 1 tablet (0.25 mg total) by mouth 2 (two) times daily as needed for anxiety. 30 tablet 0  . aspirin EC 81 MG EC tablet Take 1 tablet (81 mg total) by mouth daily.    [name] atorvastatin (LIPITOR) 80 MG tablet Take 1 tablet (80 mg total) by mouth daily at 6 PM. 30 tablet 11  . cetirizine (ZYRTEC) 10 MG tablet Take 10 mg by mouth daily.    . fenofibrate (TRICOR) 48 MG tablet Take 1 tablet (48 mg total) by mouth daily. 90 tablet 3  . irbesartan (AVAPRO) 300 MG tablet Take 1 tablet (300 mg total) by mouth daily. 30 tablet 11  . Multiple Vitamin (MULTIVITAMIN WITH MINERALS) TABS tablet Take 1 tablet by mouth daily.    . nebivolol (BYSTOLIC) 10 MG tablet Take 1 tablet (10 mg total) by mouth daily. 30 tablet 5  . nitroGLYCERIN (NITROSTAT) 0.4 MG SL tablet Place 1 tablet (0.4 mg total) under the tongue every 5 (five) minutes as needed for chest pain. 25 tablet 3  . omega-3 acid ethyl esters (LOVAZA) 1 G capsule Take 1,500 g by mouth daily.    . pantoprazole (PROTONIX) 40 MG tablet Take 40 mg by mouth daily.    [name] SPIRIVA HANDIHALER 18 MCG inhalation capsule Place 1 capsule into the nose as needed.  12  . triamcinolone (NASACORT  ALLERGY 24HR) 55 MCG/ACT AERO nasal inhaler Place 2 sprays into the nose daily.    . vitamin E 400 UNIT capsule Take 400 Units by mouth daily.     No current facility-administered medications for this visit.    Past Medical History  Diagnosis Date  . Hypertension   . COPD (chronic obstructive pulmonary disease)   . Hyperlipidemia   . Myocardial infarction 10/16/2013  . CAD (coronary artery disease)     Past Surgical History  Procedure Laterality Date  . Cardiac catheterization  10/17/13    significant 3 vessel disease  . Left heart catheterization with coronary angiogram N/A  10/17/2013    Procedure: LEFT HEART CATHETERIZATION WITH CORONARY ANGIOGRAM;  Surgeon: Lesleigh Noe, MD;  Location: [facility] CATH LAB;  Service: Cardiovascular;  Laterality: N/A;    Family History  Problem Relation Age of Onset  . Allergies Mother   . Heart disease Father   . Heart disease Brother   . Rheum arthritis Brother   . Breast cancer Mother     History   Social History  . Marital Status: Married    Spouse Name: N/A  . Number of Children: N/A  . Years of Education: N/A   Occupational History  . Not on file.   Social History Main Topics  . Smoking status: Former Smoker -- 1.00 packs/day for 45 years    Quit date: 10/16/2013  . Smokeless tobacco: Never Used  . Alcohol Use: Yes     Comment: occasional  . Drug Use: No  . Sexual Activity: Not Currently   Other Topics Concern  . Not on file   Social History Narrative    Review of systems: NYHA functional class II dyspnea on exertion (sweeping, mopping, but not showering or getting dressed) The patient specifically denies any chest pain at rest or with exertion, dyspnea at rest, orthopnea, paroxysmal nocturnal dyspnea, syncope, palpitations, focal neurological deficits, intermittent claudication, lower extremity edema, unexplained weight gain, cough, hemoptysis or wheezing.  The patient also denies abdominal pain, nausea, vomiting, diarrhea, constipation, polyuria, polydipsia, dysuria, hematuria, frequency, urgency, abnormal bleeding or bruising, fever, chills, unexpected weight changes, mood swings, change in skin or hair texture, change in voice quality, auditory or visual problems, allergic reactions or rashes, new musculoskeletal complaints other than usual "aches and pains".  PHYSICAL EXAM BP 142/74 mmHg  Pulse 71  Resp 16  Ht  (1.549 m)  Wt 176 lb 1.6 oz (79.878 kg)  BMI 33.29 kg/m2 General: Alert, oriented x3, no distress Head: no evidence of trauma, PERRL, EOMI, no exophtalmos or lid lag, no myxedema,  no xanthelasma; normal ears, nose and oropharynx Neck: normal jugular venous pulsations and no hepatojugular reflux; brisk carotid pulses without delay and no carotid bruits Chest: clear to auscultation, no signs of consolidation by percussion or palpation, normal fremitus, symmetrical and full respiratory excursions Cardiovascular: normal position and quality of the apical impulse, regular rhythm, normal first and second heart sounds, no murmurs, rubs or gallops Abdomen: no tenderness or distention, no masses by palpation, no abnormal pulsatility or arterial bruits, normal bowel sounds, no hepatosplenomegaly Extremities: no clubbing, cyanosis or edema; 2+ radial, ulnar and brachial pulses bilaterally; 2+ right femoral, posterior tibial and dorsalis pedis pulses; 2+ left femoral, posterior tibial and dorsalis pedis pulses; no subclavian or femoral bruits Neurological: grossly nonfocal   EKG: NSR, LAD, old inferior  MI  Lipid Panel     Component Value Date/Time   CHOL 165 09/12/2014 0953  TRIG 204* 09/12/2014 0953   HDL 32* 09/12/2014 0953   CHOLHDL 5.2 09/12/2014 0953   VLDL 41* 09/12/2014 0953   LDLCALC 92 09/12/2014 0953   LDLDIRECT 157.2 12/11/2009 0755    BMET    Component Value Date/Time   NA 138 02/20/2014 0815   K 4.4 02/20/2014 0815   CL 97 02/20/2014 0815   CO2 31 02/20/2014 0815   GLUCOSE 103* 02/20/2014 0815   BUN 19 02/20/2014 0815   CREATININE 0.94 02/20/2014 0815   CREATININE 0.66 10/16/2013 1550   CALCIUM 9.5 02/20/2014 0815   GFRNONAA 81 10/29/2013 0952   GFRNONAA >90 10/16/2013 1550   GFRAA >89 10/29/2013 0952   GFRAA >90 10/16/2013 1550     ASSESSMENT AND PLAN  CAD, multiple vessel  Asymptomatic. Remarkable improvement with only medical therapy. The patient remains terrified about potential respiratory complications with bypass surgery. Will continue medical therapy.  COPD GOLD II / obesity related restrictive lung disease She has no wheezing. She  has quit smoking completely.  HYPERTENSION Her blood pressure was a little high today, but she is quite nervous, as she is whenever there is a discussion about bypass surgery. At home her blood pressures 110-130s/60s.  HYPERLIPIDEMIA Target LDL<70 with recent deterioration despite reported 100% compliance with statin. Redouble efforts at diet control and weight loss. Add zetia if still not at target in 3 months.  Orders Placed This Encounter  Procedures  . Lipid panel  . Hepatic function panel  . EKG 12-Lead   Meds ordered this encounter  Medications  . SPIRIVA HANDIHALER 18 MCG inhalation capsule    Sig: Place 1 capsule into the nose as needed.    Refill:  [id number], [place], [facility] [phone] office [phone] pager

## 2014-10-10 ENCOUNTER — Other Ambulatory Visit: Payer: Self-pay | Admitting: Physician Assistant

## 2014-10-10 NOTE — Telephone Encounter (Signed)
Rx(s) sent to pharmacy electronically.  

## 2014-10-12 ENCOUNTER — Other Ambulatory Visit: Payer: Self-pay | Admitting: Physician Assistant

## 2014-10-13 NOTE — Telephone Encounter (Signed)
Rx(s) sent to pharmacy electronically.  

## 2014-11-21 ENCOUNTER — Other Ambulatory Visit: Payer: Self-pay | Admitting: Physician Assistant

## 2014-11-21 NOTE — Telephone Encounter (Signed)
Rx(s) sent to pharmacy electronically.  

## 2015-01-19 ENCOUNTER — Other Ambulatory Visit: Payer: Self-pay

## 2015-02-02 ENCOUNTER — Ambulatory Visit: Payer: Medicare Other | Admitting: Family Medicine

## 2015-02-08 ENCOUNTER — Other Ambulatory Visit: Payer: Self-pay | Admitting: Cardiovascular Disease

## 2015-02-10 ENCOUNTER — Other Ambulatory Visit: Payer: Self-pay | Admitting: Cardiovascular Disease

## 2015-02-10 NOTE — Telephone Encounter (Signed)
Rx has been sent to the pharmacy electronically. ° °

## 2015-03-19 ENCOUNTER — Other Ambulatory Visit: Payer: Self-pay | Admitting: Cardiovascular Disease

## 2015-03-19 NOTE — Telephone Encounter (Signed)
REFILL 

## 2015-05-12 ENCOUNTER — Other Ambulatory Visit: Payer: Self-pay | Admitting: Cardiovascular Disease

## 2015-05-12 NOTE — Telephone Encounter (Signed)
Rx(s) sent to pharmacy electronically. OV 05/21/2015

## 2015-05-17 ENCOUNTER — Other Ambulatory Visit: Payer: Self-pay | Admitting: Cardiovascular Disease

## 2015-05-20 ENCOUNTER — Telehealth: Payer: Self-pay | Admitting: Cardiovascular Disease

## 2015-05-20 NOTE — Telephone Encounter (Signed)
Outgoing call to patient. She reports systolic BPs 170s for past 2-3 days. She does not report any symptoms w/ this. States she is compliant w/ current BP meds (Bystolic 10mg  daily and Avapro 300mg  daily) Pt has appt tomorrow w/ Dr. Royann Shivers. As she recalls, he had discussed adding BP med at last OV 6 months ago but she was not started on anything at that time.  Encouraged her to address this with physician at visit tomorrow. Pt voiced understanding.

## 2015-05-20 NOTE — Telephone Encounter (Signed)
Beverly Wheeler is calling because her bp has been running high for the last 2-3 days . Please call  Thanks

## 2015-05-21 ENCOUNTER — Ambulatory Visit: Payer: Medicare Other | Admitting: Cardiovascular Disease

## 2015-06-10 ENCOUNTER — Other Ambulatory Visit: Payer: Self-pay | Admitting: Cardiovascular Disease

## 2015-06-10 ENCOUNTER — Other Ambulatory Visit: Payer: Self-pay | Admitting: Physician Assistant

## 2015-06-10 MED ORDER — NITROGLYCERIN 0.4 MG SL SUBL: 0.4000 mg | SUBLINGUAL_TABLET | SUBLINGUAL | Status: DC | PRN

## 2015-06-10 NOTE — Telephone Encounter (Signed)
°*  STAT* If patient is at the pharmacy, call can be transferred to refill team.   1. Which medications need to be refilled? (please list name of each medication and dose if known) Nitrostat 0.4mg   2. Which pharmacy/location (including street and city if local pharmacy) is [facility] [address]  3. Do they need a 30 day or 90 day supply? 30

## 2015-06-10 NOTE — Telephone Encounter (Signed)
Rx(s) sent to pharmacy electronically.  

## 2015-06-17 ENCOUNTER — Other Ambulatory Visit: Payer: Self-pay | Admitting: Cardiovascular Disease

## 2015-06-17 NOTE — Telephone Encounter (Signed)
Rx request sent to pharmacy.  

## 2015-08-10 ENCOUNTER — Other Ambulatory Visit: Payer: Self-pay | Admitting: Cardiovascular Disease

## 2015-08-10 NOTE — Telephone Encounter (Signed)
Rx request sent to pharmacy.  

## 2015-08-17 ENCOUNTER — Ambulatory Visit: Payer: Medicare Other | Admitting: Cardiovascular Disease

## 2015-09-08 ENCOUNTER — Other Ambulatory Visit: Payer: Self-pay | Admitting: Cardiovascular Disease

## 2015-09-08 NOTE — Telephone Encounter (Signed)
Rx(s) sent to pharmacy electronically. OV 10/02/2015

## 2015-09-09 ENCOUNTER — Other Ambulatory Visit: Payer: Self-pay | Admitting: Cardiovascular Disease

## 2015-09-09 NOTE — Telephone Encounter (Signed)
Rx(s) sent to pharmacy electronically. OV 10/02/15

## 2015-09-22 ENCOUNTER — Other Ambulatory Visit: Payer: Self-pay | Admitting: Cardiovascular Disease

## 2015-09-22 NOTE — Telephone Encounter (Signed)
REFILL 

## 2015-10-02 ENCOUNTER — Ambulatory Visit: Payer: Medicare Other | Admitting: Cardiovascular Disease

## 2015-10-12 ENCOUNTER — Other Ambulatory Visit: Payer: Self-pay | Admitting: Cardiovascular Disease

## 2015-10-12 NOTE — Telephone Encounter (Signed)
Rx request sent to pharmacy.  

## 2015-10-14 ENCOUNTER — Other Ambulatory Visit: Payer: Self-pay | Admitting: Cardiovascular Disease

## 2015-10-14 NOTE — Telephone Encounter (Signed)
Rx(s) sent to pharmacy electronically.  

## 2015-10-23 ENCOUNTER — Other Ambulatory Visit: Payer: Self-pay | Admitting: Cardiovascular Disease

## 2015-10-23 NOTE — Telephone Encounter (Signed)
Rx request sent to pharmacy.  

## 2015-10-29 ENCOUNTER — Ambulatory Visit: Payer: Medicare Other | Admitting: Cardiovascular Disease

## 2015-11-09 ENCOUNTER — Other Ambulatory Visit: Payer: Self-pay | Admitting: Cardiovascular Disease

## 2015-11-09 NOTE — Telephone Encounter (Signed)
Rx(s) sent to pharmacy electronically.  

## 2015-11-18 ENCOUNTER — Other Ambulatory Visit: Payer: Self-pay | Admitting: Cardiovascular Disease

## 2015-11-26 ENCOUNTER — Ambulatory Visit: Payer: Medicare Other | Admitting: Cardiovascular Disease

## 2015-12-10 ENCOUNTER — Other Ambulatory Visit: Payer: Self-pay | Admitting: Cardiovascular Disease

## 2015-12-10 NOTE — Telephone Encounter (Signed)
Rx request sent to pharmacy.  

## 2015-12-17 ENCOUNTER — Other Ambulatory Visit: Payer: Self-pay | Admitting: Cardiovascular Disease

## 2015-12-17 NOTE — Telephone Encounter (Signed)
Rx(s) sent to pharmacy electronically.  

## 2015-12-28 ENCOUNTER — Other Ambulatory Visit: Payer: Self-pay | Admitting: Cardiovascular Disease

## 2016-01-07 ENCOUNTER — Ambulatory Visit: Payer: Medicare Other | Admitting: Cardiovascular Disease

## 2016-01-28 ENCOUNTER — Other Ambulatory Visit: Payer: Self-pay

## 2016-01-28 MED ORDER — FENOFIBRATE 48 MG PO TABS: 48.0000 mg | ORAL_TABLET | Freq: Every day | ORAL | Status: DC

## 2016-02-08 ENCOUNTER — Other Ambulatory Visit: Payer: Self-pay | Admitting: Cardiovascular Disease

## 2016-02-08 NOTE — Telephone Encounter (Signed)
Rx(s) sent to pharmacy electronically.  

## 2016-03-07 ENCOUNTER — Ambulatory Visit: Payer: Medicare Other | Admitting: Cardiovascular Disease

## 2016-03-10 ENCOUNTER — Other Ambulatory Visit: Payer: Self-pay | Admitting: Cardiovascular Disease

## 2016-03-10 NOTE — Telephone Encounter (Signed)
REFILL 

## 2016-03-24 ENCOUNTER — Other Ambulatory Visit: Payer: Self-pay | Admitting: Cardiovascular Disease

## 2016-03-29 NOTE — Telephone Encounter (Signed)
Rx(s) sent to pharmacy electronically.  

## 2016-03-30 ENCOUNTER — Ambulatory Visit: Payer: Medicare Other | Admitting: Cardiovascular Disease

## 2016-04-06 ENCOUNTER — Other Ambulatory Visit: Payer: Self-pay | Admitting: Cardiovascular Disease

## 2016-04-25 ENCOUNTER — Other Ambulatory Visit: Payer: Self-pay | Admitting: Cardiovascular Disease

## 2016-04-26 NOTE — Telephone Encounter (Signed)
Rx request sent to pharmacy.  

## 2016-04-29 ENCOUNTER — Ambulatory Visit: Payer: Medicare Other | Admitting: Cardiovascular Disease

## 2016-05-15 ENCOUNTER — Other Ambulatory Visit: Payer: Self-pay | Admitting: Cardiovascular Disease

## 2016-05-26 ENCOUNTER — Ambulatory Visit: Payer: Medicare Other | Admitting: Cardiovascular Disease

## 2016-06-05 ENCOUNTER — Other Ambulatory Visit: Payer: Self-pay | Admitting: Cardiovascular Disease

## 2016-06-07 NOTE — Telephone Encounter (Signed)
Rx(s) sent to pharmacy electronically.  

## 2016-06-14 ENCOUNTER — Other Ambulatory Visit: Payer: Self-pay | Admitting: Cardiovascular Disease

## 2016-06-14 NOTE — Telephone Encounter (Signed)
Rx request sent to pharmacy.  

## 2016-07-01 ENCOUNTER — Other Ambulatory Visit: Payer: Self-pay | Admitting: Cardiovascular Disease

## 2016-07-07 ENCOUNTER — Other Ambulatory Visit: Payer: Self-pay | Admitting: Cardiovascular Disease

## 2016-07-08 ENCOUNTER — Ambulatory Visit: Payer: Medicare Other | Admitting: Cardiovascular Disease

## 2016-08-02 ENCOUNTER — Other Ambulatory Visit: Payer: Self-pay | Admitting: Cardiovascular Disease

## 2016-08-22 ENCOUNTER — Ambulatory Visit: Payer: Medicare Other | Admitting: Cardiovascular Disease

## 2016-09-07 ENCOUNTER — Other Ambulatory Visit: Payer: Self-pay | Admitting: Cardiovascular Disease

## 2016-09-07 NOTE — Telephone Encounter (Signed)
Rx(s) sent to pharmacy electronically.  

## 2016-10-04 ENCOUNTER — Other Ambulatory Visit: Payer: Self-pay | Admitting: Cardiovascular Disease

## 2016-10-04 NOTE — Telephone Encounter (Signed)
Rx(s) sent to pharmacy electronically.  

## 2016-10-25 ENCOUNTER — Ambulatory Visit: Payer: Medicare Other | Admitting: Cardiovascular Disease

## 2016-11-05 ENCOUNTER — Other Ambulatory Visit: Payer: Self-pay | Admitting: Cardiovascular Disease

## 2016-12-05 ENCOUNTER — Other Ambulatory Visit: Payer: Self-pay | Admitting: Cardiovascular Disease

## 2016-12-05 NOTE — Telephone Encounter (Signed)
Rx(s) sent to pharmacy electronically.  

## 2016-12-06 ENCOUNTER — Other Ambulatory Visit: Payer: Self-pay | Admitting: Cardiovascular Disease

## 2016-12-08 ENCOUNTER — Other Ambulatory Visit: Payer: Self-pay | Admitting: Cardiovascular Disease

## 2016-12-15 ENCOUNTER — Ambulatory Visit: Payer: Medicare Other | Admitting: Cardiovascular Disease

## 2017-01-04 ENCOUNTER — Other Ambulatory Visit: Payer: Self-pay | Admitting: Cardiovascular Disease

## 2017-01-21 ENCOUNTER — Other Ambulatory Visit: Payer: Self-pay | Admitting: Cardiovascular Disease

## 2017-02-07 ENCOUNTER — Other Ambulatory Visit: Payer: Self-pay | Admitting: Cardiovascular Disease

## 2017-02-24 ENCOUNTER — Ambulatory Visit: Payer: Medicare Other | Admitting: Cardiovascular Disease

## 2017-03-10 ENCOUNTER — Other Ambulatory Visit: Payer: Self-pay | Admitting: Cardiovascular Disease

## 2017-03-27 ENCOUNTER — Other Ambulatory Visit: Payer: Self-pay | Admitting: Cardiovascular Disease

## 2017-04-06 ENCOUNTER — Other Ambulatory Visit: Payer: Self-pay | Admitting: Cardiovascular Disease

## 2017-04-21 ENCOUNTER — Other Ambulatory Visit: Payer: Self-pay | Admitting: Cardiovascular Disease

## 2017-05-17 ENCOUNTER — Other Ambulatory Visit: Payer: Self-pay | Admitting: Cardiovascular Disease

## 2017-05-23 ENCOUNTER — Ambulatory Visit: Payer: Medicare Other | Admitting: Cardiovascular Disease

## 2017-06-15 ENCOUNTER — Other Ambulatory Visit: Payer: Self-pay | Admitting: Cardiovascular Disease

## 2017-06-19 NOTE — Telephone Encounter (Signed)
Patient was last seen in 08/2014. She has an appointment with [facility] in 08/2017. Patient has cancelled 15 appointments in the last 2 years for various reasons.  Rx refused. Patient must be seen for refills.

## 2017-06-24 ENCOUNTER — Other Ambulatory Visit: Payer: Self-pay | Admitting: Cardiovascular Disease

## 2017-07-02 ENCOUNTER — Other Ambulatory Visit: Payer: Self-pay | Admitting: Cardiovascular Disease

## 2017-07-20 ENCOUNTER — Other Ambulatory Visit: Payer: Self-pay | Admitting: Cardiology

## 2017-07-27 ENCOUNTER — Other Ambulatory Visit: Payer: Self-pay | Admitting: Cardiovascular Disease

## 2017-08-05 ENCOUNTER — Other Ambulatory Visit: Payer: Self-pay | Admitting: Cardiology

## 2017-08-07 NOTE — Telephone Encounter (Signed)
Rx(s) sent to pharmacy electronically.  

## 2017-08-28 ENCOUNTER — Ambulatory Visit: Payer: Medicare Other | Admitting: Cardiovascular Disease

## 2017-09-03 ENCOUNTER — Other Ambulatory Visit: Payer: Self-pay | Admitting: Cardiovascular Disease

## 2017-09-04 ENCOUNTER — Other Ambulatory Visit: Payer: Self-pay | Admitting: Cardiovascular Disease

## 2017-09-04 ENCOUNTER — Other Ambulatory Visit: Payer: Self-pay | Admitting: Cardiology

## 2017-09-04 NOTE — Telephone Encounter (Signed)
REFILL 

## 2017-09-26 ENCOUNTER — Ambulatory Visit: Payer: Medicare Other | Admitting: Cardiovascular Disease

## 2017-10-05 ENCOUNTER — Other Ambulatory Visit: Payer: Self-pay | Admitting: Cardiology

## 2017-10-05 ENCOUNTER — Other Ambulatory Visit: Payer: Self-pay | Admitting: Cardiovascular Disease

## 2017-11-02 ENCOUNTER — Other Ambulatory Visit: Payer: Self-pay | Admitting: Cardiovascular Disease

## 2017-11-03 ENCOUNTER — Other Ambulatory Visit: Payer: Self-pay | Admitting: Cardiovascular Disease

## 2017-11-03 MED ORDER — FENOFIBRATE 48 MG PO TABS: 48.0000 mg | ORAL_TABLET | Freq: Every day | ORAL | 0 refills | Status: DC

## 2017-11-06 ENCOUNTER — Other Ambulatory Visit: Payer: Self-pay | Admitting: Cardiology

## 2017-11-06 NOTE — Telephone Encounter (Signed)
Rx has been sent to the pharmacy electronically. ° °

## 2017-11-09 ENCOUNTER — Ambulatory Visit: Payer: Medicare Other | Admitting: Cardiovascular Disease

## 2017-11-17 ENCOUNTER — Other Ambulatory Visit: Payer: Self-pay | Admitting: Cardiovascular Disease

## 2017-11-22 ENCOUNTER — Emergency Department (HOSPITAL_COMMUNITY): Payer: Medicare Other

## 2017-11-22 ENCOUNTER — Inpatient Hospital Stay (HOSPITAL_COMMUNITY)
Admission: EM | Admit: 2017-11-22 | Discharge: 2017-12-05 | DRG: 871 | Disposition: A | Discharge status: Skilled Nursing Facility | Payer: Medicare Other | Attending: Internal Medicine | Admitting: Internal Medicine

## 2017-11-22 ENCOUNTER — Encounter (HOSPITAL_COMMUNITY): Payer: Self-pay | Admitting: Emergency Medicine

## 2017-11-22 DIAGNOSIS — E538 Deficiency of other specified B group vitamins: Secondary | ICD-10-CM | POA: Diagnosis present

## 2017-11-22 DIAGNOSIS — L8990 Pressure ulcer of unspecified site, unspecified stage: Secondary | ICD-10-CM

## 2017-11-22 DIAGNOSIS — Z23 Encounter for immunization: Secondary | ICD-10-CM

## 2017-11-22 DIAGNOSIS — Z7951 Long term (current) use of inhaled steroids: Secondary | ICD-10-CM | POA: Diagnosis not present

## 2017-11-22 DIAGNOSIS — Z87891 Personal history of nicotine dependence: Secondary | ICD-10-CM

## 2017-11-22 DIAGNOSIS — E785 Hyperlipidemia, unspecified: Secondary | ICD-10-CM | POA: Diagnosis not present

## 2017-11-22 DIAGNOSIS — I2510 Atherosclerotic heart disease of native coronary artery without angina pectoris: Secondary | ICD-10-CM | POA: Diagnosis not present

## 2017-11-22 DIAGNOSIS — M6281 Muscle weakness (generalized): Secondary | ICD-10-CM | POA: Diagnosis not present

## 2017-11-22 DIAGNOSIS — L97919 Non-pressure chronic ulcer of unspecified part of right lower leg with unspecified severity: Secondary | ICD-10-CM | POA: Diagnosis present

## 2017-11-22 DIAGNOSIS — I252 Old myocardial infarction: Secondary | ICD-10-CM

## 2017-11-22 DIAGNOSIS — I5021 Acute systolic (congestive) heart failure: Secondary | ICD-10-CM | POA: Diagnosis not present

## 2017-11-22 DIAGNOSIS — J9601 Acute respiratory failure with hypoxia: Secondary | ICD-10-CM | POA: Diagnosis not present

## 2017-11-22 DIAGNOSIS — I340 Nonrheumatic mitral (valve) insufficiency: Secondary | ICD-10-CM | POA: Diagnosis not present

## 2017-11-22 DIAGNOSIS — R279 Unspecified lack of coordination: Secondary | ICD-10-CM | POA: Diagnosis not present

## 2017-11-22 DIAGNOSIS — B889 Infestation, unspecified: Secondary | ICD-10-CM

## 2017-11-22 DIAGNOSIS — E875 Hyperkalemia: Secondary | ICD-10-CM | POA: Diagnosis present

## 2017-11-22 DIAGNOSIS — R0602 Shortness of breath: Secondary | ICD-10-CM

## 2017-11-22 DIAGNOSIS — I9589 Other hypotension: Secondary | ICD-10-CM | POA: Diagnosis not present

## 2017-11-22 DIAGNOSIS — R571 Hypovolemic shock: Secondary | ICD-10-CM | POA: Diagnosis not present

## 2017-11-22 DIAGNOSIS — I5023 Acute on chronic systolic (congestive) heart failure: Secondary | ICD-10-CM | POA: Diagnosis not present

## 2017-11-22 DIAGNOSIS — E861 Hypovolemia: Secondary | ICD-10-CM | POA: Diagnosis not present

## 2017-11-22 DIAGNOSIS — I739 Peripheral vascular disease, unspecified: Secondary | ICD-10-CM | POA: Diagnosis present

## 2017-11-22 DIAGNOSIS — R1311 Dysphagia, oral phase: Secondary | ICD-10-CM | POA: Diagnosis not present

## 2017-11-22 DIAGNOSIS — R6520 Severe sepsis without septic shock: Secondary | ICD-10-CM | POA: Diagnosis not present

## 2017-11-22 DIAGNOSIS — L03116 Cellulitis of left lower limb: Secondary | ICD-10-CM

## 2017-11-22 DIAGNOSIS — Z8249 Family history of ischemic heart disease and other diseases of the circulatory system: Secondary | ICD-10-CM

## 2017-11-22 DIAGNOSIS — R6521 Severe sepsis with septic shock: Secondary | ICD-10-CM | POA: Diagnosis not present

## 2017-11-22 DIAGNOSIS — L03115 Cellulitis of right lower limb: Secondary | ICD-10-CM | POA: Diagnosis present

## 2017-11-22 DIAGNOSIS — E611 Iron deficiency: Secondary | ICD-10-CM | POA: Diagnosis present

## 2017-11-22 DIAGNOSIS — J449 Chronic obstructive pulmonary disease, unspecified: Secondary | ICD-10-CM | POA: Diagnosis present

## 2017-11-22 DIAGNOSIS — J9690 Respiratory failure, unspecified, unspecified whether with hypoxia or hypercapnia: Secondary | ICD-10-CM

## 2017-11-22 DIAGNOSIS — I959 Hypotension, unspecified: Secondary | ICD-10-CM | POA: Diagnosis present

## 2017-11-22 DIAGNOSIS — J9600 Acute respiratory failure, unspecified whether with hypoxia or hypercapnia: Secondary | ICD-10-CM | POA: Diagnosis not present

## 2017-11-22 DIAGNOSIS — R531 Weakness: Secondary | ICD-10-CM | POA: Diagnosis not present

## 2017-11-22 DIAGNOSIS — Z79899 Other long term (current) drug therapy: Secondary | ICD-10-CM | POA: Diagnosis not present

## 2017-11-22 DIAGNOSIS — L97929 Non-pressure chronic ulcer of unspecified part of left lower leg with unspecified severity: Secondary | ICD-10-CM | POA: Diagnosis present

## 2017-11-22 DIAGNOSIS — J90 Pleural effusion, not elsewhere classified: Secondary | ICD-10-CM | POA: Diagnosis not present

## 2017-11-22 DIAGNOSIS — R262 Difficulty in walking, not elsewhere classified: Secondary | ICD-10-CM | POA: Diagnosis not present

## 2017-11-22 DIAGNOSIS — E860 Dehydration: Secondary | ICD-10-CM | POA: Diagnosis present

## 2017-11-22 DIAGNOSIS — Z7982 Long term (current) use of aspirin: Secondary | ICD-10-CM | POA: Diagnosis not present

## 2017-11-22 DIAGNOSIS — N179 Acute kidney failure, unspecified: Secondary | ICD-10-CM

## 2017-11-22 DIAGNOSIS — J9811 Atelectasis: Secondary | ICD-10-CM | POA: Diagnosis not present

## 2017-11-22 DIAGNOSIS — A419 Sepsis, unspecified organism: Principal | ICD-10-CM

## 2017-11-22 DIAGNOSIS — R609 Edema, unspecified: Secondary | ICD-10-CM | POA: Diagnosis not present

## 2017-11-22 DIAGNOSIS — R404 Transient alteration of awareness: Secondary | ICD-10-CM | POA: Diagnosis not present

## 2017-11-22 DIAGNOSIS — I110 Hypertensive heart disease with heart failure: Secondary | ICD-10-CM | POA: Diagnosis present

## 2017-11-22 DIAGNOSIS — Z955 Presence of coronary angioplasty implant and graft: Secondary | ICD-10-CM | POA: Diagnosis not present

## 2017-11-22 DIAGNOSIS — E876 Hypokalemia: Secondary | ICD-10-CM

## 2017-11-22 DIAGNOSIS — Z743 Need for continuous supervision: Secondary | ICD-10-CM | POA: Diagnosis not present

## 2017-11-22 DIAGNOSIS — N183 Chronic kidney disease, stage 3 unspecified: Secondary | ICD-10-CM | POA: Diagnosis present

## 2017-11-22 LAB — COMPREHENSIVE METABOLIC PANEL
ALBUMIN: 2.9 g/dL — AB (ref 3.5–5.0)
ALT: 9 U/L — ABNORMAL LOW (ref 14–54)
AST: 14 U/L — ABNORMAL LOW (ref 15–41)
Alkaline Phosphatase: 59 U/L (ref 38–126)
Anion gap: 11 (ref 5–15)
BUN: 103 mg/dL — ABNORMAL HIGH (ref 6–20)
CHLORIDE: 95 mmol/L — AB (ref 101–111)
CO2: 18 mmol/L — AB (ref 22–32)
Calcium: 8.4 mg/dL — ABNORMAL LOW (ref 8.9–10.3)
Creatinine, Ser: 5.08 mg/dL — ABNORMAL HIGH (ref 0.44–1.00)
GFR calc Af Amer: 9 mL/min — ABNORMAL LOW (ref >60)
GFR calc non Af Amer: 8 mL/min — ABNORMAL LOW (ref >60)
GLUCOSE: 108 mg/dL — AB (ref 65–99)
POTASSIUM: 6.7 mmol/L — AB (ref 3.5–5.1)
SODIUM: 124 mmol/L — AB (ref 135–145)
TOTAL PROTEIN: 6.4 g/dL — AB (ref 6.5–8.1)
Total Bilirubin: 0.4 mg/dL (ref 0.3–1.2)

## 2017-11-22 LAB — URINALYSIS, ROUTINE W REFLEX MICROSCOPIC
Bilirubin Urine: NEGATIVE
GLUCOSE, UA: NEGATIVE mg/dL
Hgb urine dipstick: NEGATIVE
KETONES UR: NEGATIVE mg/dL
NITRITE: NEGATIVE
PH: 5 (ref 5.0–8.0)
Protein, ur: NEGATIVE mg/dL
Specific Gravity, Urine: 1.016 (ref 1.005–1.030)

## 2017-11-22 LAB — CBC WITH DIFFERENTIAL/PLATELET
BASOS PCT: 1 %
Basophils Absolute: 0.1 10*3/uL (ref 0.0–0.1)
EOS ABS: 0.2 10*3/uL (ref 0.0–0.7)
Eosinophils Relative: 2 %
HCT: 33.4 % — ABNORMAL LOW (ref 36.0–46.0)
Hemoglobin: 10.8 g/dL — ABNORMAL LOW (ref 12.0–15.0)
Lymphocytes Relative: 14 %
Lymphs Abs: 1.4 10*3/uL (ref 0.7–4.0)
MCH: 28.1 pg (ref 26.0–34.0)
MCHC: 32.3 g/dL (ref 30.0–36.0)
MCV: 86.8 fL (ref 78.0–100.0)
MONO ABS: 0.5 10*3/uL (ref 0.1–1.0)
MONOS PCT: 4 %
Neutro Abs: 8.1 10*3/uL — ABNORMAL HIGH (ref 1.7–7.7)
Neutrophils Relative %: 79 %
PLATELETS: 259 10*3/uL (ref 150–400)
RBC: 3.85 MIL/uL — ABNORMAL LOW (ref 3.87–5.11)
RDW: 16.8 % — AB (ref 11.5–15.5)
WBC: 10.2 10*3/uL (ref 4.0–10.5)

## 2017-11-22 LAB — I-STAT TROPONIN, ED: Troponin i, poc: 0.02 ng/mL (ref 0.00–0.08)

## 2017-11-22 LAB — I-STAT CG4 LACTIC ACID, ED: Lactic Acid, Venous: 0.86 mmol/L (ref 0.5–1.9)

## 2017-11-22 LAB — POC OCCULT BLOOD, ED: Fecal Occult Bld: NEGATIVE

## 2017-11-22 MED ORDER — INSULIN ASPART 100 UNIT/ML ~~LOC~~ SOLN
10.0000 [IU] | Freq: Once | SUBCUTANEOUS | Status: AC
  Administered 2017-11-22: 10 [IU] via INTRAVENOUS
  Filled 2017-11-22: qty 1

## 2017-11-22 MED ORDER — DEXTROSE 50 % IV SOLN
1.0000 | Freq: Once | INTRAVENOUS | Status: AC
  Administered 2017-11-22: 50 mL via INTRAVENOUS
  Filled 2017-11-22: qty 50

## 2017-11-22 MED ORDER — VANCOMYCIN HCL IN DEXTROSE 1-5 GM/200ML-% IV SOLN
1000.0000 mg | INTRAVENOUS | Status: DC
Start: 2017-11-22 — End: 2017-11-25
  Administered 2017-11-22 – 2017-11-24 (×2): 1000 mg via INTRAVENOUS
  Filled 2017-11-22 (×2): qty 200

## 2017-11-22 MED ORDER — ONDANSETRON HCL 4 MG/2ML IJ SOLN
4.0000 mg | Freq: Once | INTRAMUSCULAR | Status: AC
  Administered 2017-11-22: 4 mg via INTRAVENOUS
  Filled 2017-11-22: qty 2

## 2017-11-22 MED ORDER — CEFAZOLIN SODIUM-DEXTROSE 1-4 GM/50ML-% IV SOLN
1.0000 g | Freq: Two times a day (BID) | INTRAVENOUS | Status: DC
  Administered 2017-11-23 (×2): 1 g via INTRAVENOUS
  Filled 2017-11-22 (×2): qty 50

## 2017-11-22 MED ORDER — ACETAMINOPHEN 325 MG PO TABS
650.0000 mg | ORAL_TABLET | Freq: Once | ORAL | Status: AC
  Administered 2017-11-22: 650 mg via ORAL
  Filled 2017-11-22: qty 2

## 2017-11-22 MED ORDER — SODIUM CHLORIDE 0.9 % IV BOLUS
1000.0000 mL | Freq: Once | INTRAVENOUS | Status: AC
  Administered 2017-11-22: 1000 mL via INTRAVENOUS

## 2017-11-22 MED ORDER — SODIUM POLYSTYRENE SULFONATE 15 GM/60ML PO SUSP
30.0000 g | Freq: Once | ORAL | Status: AC
  Administered 2017-11-22: 30 g via ORAL
  Filled 2017-11-22: qty 120

## 2017-11-22 MED ORDER — SODIUM CHLORIDE 0.9 % IV BOLUS
1000.0000 mL | Freq: Once | INTRAVENOUS | Status: AC
Start: 2017-11-22 — End: 2017-11-22
  Administered 2017-11-22: 1000 mL via INTRAVENOUS

## 2017-11-22 NOTE — Progress Notes (Signed)
Pharmacy Antibiotic Note  Beverly Wheeler is a 72 y.o. female admitted on 11/22/2017 with cellulitis.  Pharmacy initiially consulted for vancomycin and now consulted to cefazolin dosing. WBC WNL, no temp documented. Scr 5.08, previous baseline < 1 in 2015. Estimated CrCl ~ 65mL/min.  Plan: Vancomycin 1000mg  IV q48h. Goal trough 10-15 mcg/mL Cefazolin 1G IV q12 hours F/u renal function tomorrow AM and adjust as necessary - may need to hold and dose per levels F/u C&S, clinical status, de-escalation, LOT, vancomycin levels as appropriate  Weight: 176 lb (79.8 kg)  Temp (24hrs), Avg:96.7 F (35.9 C), Min:96.7 F (35.9 C), Max:96.7 F (35.9 C)  Recent Labs  Lab 11/22/17 1937 11/22/17 1946  WBC 10.2  --   CREATININE 5.08*  --   LATICACIDVEN  --  0.86    CrCl cannot be calculated (Unknown ideal weight.).    No Known Allergies  Antimicrobials this admission: Vanc 5/1 >>  Cefazolin 5/1>>  Dose adjustments this admission: None  Microbiology results: 5/1 UCx: ordered   Thank you for allowing pharmacy to be a part of this patient's care.  Ruben Im, PharmD Clinical Pharmacist 11/22/2017 11:32 PM

## 2017-11-22 NOTE — ED Notes (Signed)
Pt placed on bed pan. Unable to give sample

## 2017-11-22 NOTE — Progress Notes (Addendum)
Pharmacy Antibiotic Note  Beverly Wheeler is a 72 y.o. female admitted on 11/22/2017 with cellulitis.  Pharmacy has been consulted for vancomycin dosing. WBC WNL, no temp documented. Scr 5.08, previous baseline < 1 in 2015. Estimated CrCl ~ 26mL/min.  Plan: Vancomycin 1000mg  IV q48h. Goal trough 10-15 mcg/mL. F/u renal function tomorrow AM and adjust as necessary - may need to hold and dose per levels F/u C&S, clinical status, de-escalation, LOT, vancomycin levels as appropriate  Weight: 176 lb (79.8 kg)  No data recorded.  Recent Labs  Lab 11/22/17 1937 11/22/17 1946  WBC 10.2  --   CREATININE 5.08*  --   LATICACIDVEN  --  0.86    CrCl cannot be calculated (Unknown ideal weight.).    No Known Allergies  Antimicrobials this admission: Vanc 5/1 >>   Dose adjustments this admission: None  Microbiology results: 5/1 UCx: ordered   Thank you for allowing pharmacy to be a part of this patient's care.  Roderic Scarce Zigmund Daniel, PharmD PGY1 Pharmacy Resident Pager: [phone] 11/22/2017 8:38 PM

## 2017-11-22 NOTE — H&P (Signed)
[facility]                                                                                    Patient Demographics  Beverly Wheeler, is a 72 y.o. female  CSN: [id number]  MRN: [id number]  DOB - 08-08-1945  Admit Date - 11/22/2017  Outpatient Primary MD for the patient is Patient, No Pcp Per   With History of -  Past Medical History:  Diagnosis Date  . CAD (coronary artery disease)   . COPD (chronic obstructive pulmonary disease) (HCC)   . Hyperlipidemia   . Hypertension   . Myocardial infarction [facility]) 10/16/2013      Past Surgical History:  Procedure Laterality Date  . CARDIAC CATHETERIZATION  10/17/13   significant 3 vessel disease  . LEFT HEART CATHETERIZATION WITH CORONARY ANGIOGRAM N/A 10/17/2013   Procedure: LEFT HEART CATHETERIZATION WITH CORONARY ANGIOGRAM;  Surgeon: Lesleigh Noe, MD;  Location: [facility] CATH LAB;  Service: Cardiovascular;  Laterality: N/A;    in for   Chief Complaint  Patient presents with  . Weakness     HPI  Manasi Wiedmann  is a 72 y.o. female, with past medical history significant for coronary artery disease, COPD , diastolic congestive heart failure and hypertension on irbesartan ,presenting  With one-week history of generalized weakness that has been worsening and associated with nausea / vomiting today  also reports lower extremity swelling with weeping ulcers that have been bandaged at home where she stays with her son. Patient denies any chest pains or shortness of breath denies any abdominal pain or diarrhea .In the emergency room the patient was noted to be in acute renal failure with hyperkalemia but she could not tolerate by mouth Kayexalate . Patient received an ampule of D50 with insulin.    Review of Systems    In addition to the HPI above,  No Fever-chills, No Headache, No changes with Vision or hearing, No problems swallowing food or Liquids, No Chest pain, Cough or Shortness of Breath, No Abdominal pain,  No  Blood in stool or Urine, No dysuria, No new joints pains-aches,  No recent weight gain or loss, No polyuria, polydypsia or polyphagia, No significant Mental Stressors.  A full 10 point Review of Systems was done, except as stated above, all other Review of Systems were negative.   Social History Social History   Tobacco Use  . Smoking status: Former Smoker    Packs/day: 1.00    Years: 45.00    Pack years: 45.00    Last attempt to quit: 10/16/2013    Years since quitting: 4.1  . Smokeless tobacco: Never Used  Substance Use Topics  . Alcohol use: Yes    Comment: occasional     Family History Family History  Problem Relation Age of Onset  . Allergies Mother   . Breast cancer Mother   . Heart disease Father   . Heart disease Brother   . Rheum arthritis Brother      Prior to Admission medications   Medication Sig Start Date End Date Taking? Authorizing Provider  acetaminophen (TYLENOL) 500 MG tablet Take 1,000 mg by mouth every 6 (  six) hours as needed for mild pain.   Yes [provider]  aspirin EC 81 MG EC tablet Take 1 tablet (81 mg total) by mouth daily. 10/21/13  Yes Barrett, Joline Salt, PA-C  atorvastatin (LIPITOR) 80 MG tablet TAKE 1 TABLET EVERY DAY AT 6PM (KEEP OFFICE VISIT) 11/03/17  Yes Croitoru, Mihai, MD  BYSTOLIC 10 MG tablet TAKE 1 TABLET EVERY DAY (KEEP OFFICE VISIT) 11/06/17  Yes Croitoru, Mihai, MD  cetirizine (ZYRTEC) 10 MG tablet Take 10 mg by mouth daily.   Yes [provider]  fenofibrate (TRICOR) 48 MG tablet Take 1 tablet (48 mg total) by mouth daily. MUST KEEP UPCOMING APPT FOR REFILLS. 11/03/17  Yes Croitoru, Mihai, MD  irbesartan (AVAPRO) 300 MG tablet TAKE 1 TABLET BY MOUTH EVERY DAY **KEEP OFFICE VISIT** 11/20/17  Yes Croitoru, Mihai, MD  Multiple Vitamin (MULTIVITAMIN WITH MINERALS) TABS tablet Take 1 tablet by mouth daily.   Yes [provider]  nitroGLYCERIN (NITROSTAT) 0.4 MG SL tablet Place 1 tablet (0.4 mg total) under  the tongue every 5 (five) minutes as needed for chest pain. 06/10/15  Yes Croitoru, Mihai, MD  ranitidine (ZANTAC) 150 MG tablet Take 150 mg by mouth 2 (two) times daily.   Yes [provider]  triamcinolone (NASACORT ALLERGY 24HR) 55 MCG/ACT AERO nasal inhaler Place 2 sprays into the nose daily.   Yes [provider]    No Known Allergies  Physical Exam  Vitals  Blood pressure 107/61, pulse 72, temperature (!) 96.7 F (35.9 C), temperature source Rectal, resp. rate 16, weight 79.8 kg (176 lb), SpO2 99 %.   1. General extremely pleasant female lying in bed, in no acute distress  2. Normal affect and insight, Not Suicidal or Homicidal, pleasantly confused.  3. No F.N deficits,atient moving all extremities.  4. Ears and Eyes appear Normal, Conjunctivae clear, PERRLA. Moist Oral Mucosa.  5. Supple Neck, No JVD, No cervical lymphadenopathy appriciated, No Carotid Bruits.  6. Symmetrical Chest wall movement, Good air movement bilaterally, CTAB.  7. RRR, No Gallops, Rubs or Murmurs, No Parasternal Heave.  8. Positive Bowel Sounds, Abdomen Soft, Non tender, No organomegaly appriciated,No rebound -guarding or rigidity.  9.  No Cyanosis, Normal Skin Turgor, . Petechial rash noted on the handsand arms with erythema on the lower extremitiesat the level of the ankles.  10. Good muscle tone,  joints appear normal , no effusions, Normal ROM.    Data Review  CBC Recent Labs  Lab 11/22/17 1937  WBC 10.2  HGB 10.8*  HCT 33.4*  PLT 259  MCV 86.8  MCH 28.1  MCHC 32.3  RDW 16.8*  LYMPHSABS 1.4  MONOABS 0.5  EOSABS 0.2  BASOSABS 0.1   ------------------------------------------------------------------------------------------------------------------  Chemistries  Recent Labs  Lab 11/22/17 1937  NA 124*  K 6.7*  CL 95*  CO2 18*  GLUCOSE 108*  BUN 103*  CREATININE 5.08*  CALCIUM 8.4*  AST 14*  ALT 9*  ALKPHOS 59  BILITOT 0.4    ------------------------------------------------------------------------------------------------------------------ CrCl cannot be calculated (Unknown ideal weight.). ------------------------------------------------------------------------------------------------------------------ No results for input(s): TSH, T4TOTAL, T3FREE, THYROIDAB in the last 72 hours.  Invalid input(s): FREET3   Coagulation profile No results for input(s): INR, PROTIME in the last 168 hours. ------------------------------------------------------------------------------------------------------------------- No results for input(s): DDIMER in the last 72 hours. -------------------------------------------------------------------------------------------------------------------  Cardiac Enzymes No results for input(s): CKMB, TROPONINI, MYOGLOBIN in the last 168 hours.  Invalid input(s): CK ------------------------------------------------------------------------------------------------------------------ Invalid input(s): POCBNP   ---------------------------------------------------------------------------------------------------------------  Urinalysis  Component Value Date/Time   COLORURINE YELLOW 10/16/2013 1735   APPEARANCEUR CLOUDY (A) 10/16/2013 1735   LABSPEC 1.010 10/16/2013 1735   PHURINE 7.0 10/16/2013 1735   GLUCOSEU NEGATIVE 10/16/2013 1735   GLUCOSEU NEGATIVE 05/26/2009 1007   HGBUR TRACE (A) 10/16/2013 1735   BILIRUBINUR NEGATIVE 10/16/2013 1735   KETONESUR NEGATIVE 10/16/2013 1735   PROTEINUR 30 (A) 10/16/2013 1735   UROBILINOGEN 0.2 10/16/2013 1735   NITRITE NEGATIVE 10/16/2013 1735   LEUKOCYTESUR NEGATIVE 10/16/2013 1735    ----------------------------------------------------------------------------------------------------------------  Imaging results:   Dg Chest Portable 1 View  Result Date: 11/22/2017 CLINICAL DATA:  Weakness, nausea/vomiting x1 week EXAM: PORTABLE CHEST 1 VIEW  COMPARISON:  01/28/2014 FINDINGS: Lungs are clear.  No pleural effusion or pneumothorax. The heart is top-normal in size. IMPRESSION: No evidence of acute cardiopulmonary disease. Electronically Signed   By: Charline Bills M.D.   On: 11/22/2017 20:40    My personal review of EKG: Rhythm NSR, at the rate of 76 with QRS widening and nonspecific intraventricular conduction delay. Nonspecific ST changes noted    Assessment & Plan  1. Acute renal failure 2.hyperkalemia 3. History of coronary artery disease, diastolic congestive heart failure, and hypertension 4.possible infestation, however no itching reported by patient 5.lower extremity cellulitis  Plan  Hold irbesartan iV fluids Check urinalysis Consult nephrology in a.m. Keep Foley catheter for I/O Monitor labs in a.m. Check potassium level at 3 AM and treat accordingly IV Ancef   DVT Prophylaxis Heparin  AM Labs Ordered, also please review Full Orders    Code Status full  Disposition Plan: undetermined  Time spent in minutes : 53 minutes  Condition GUARDED   @SIGNATURE @

## 2017-11-22 NOTE — ED Triage Notes (Signed)
Per EMS: pt coming from home. C/O of weakness x 1 week. Pt can walk w/ assistance. Pt had bed bugs all over her legs. Pt vomited x 1 in the last 24 hours. Pt BP 98/62 and given 450 cc of NS. BP now 117/72. A&Ox4. CBG 132

## 2017-11-22 NOTE — ED Provider Notes (Signed)
[facility] EMERGENCY DEPARTMENT Provider Note   CSN: [id number] Arrival date & time: 11/22/17  1847     History   Chief Complaint Chief Complaint  Patient presents with  . Weakness    HPI Beverly Wheeler is a 72 y.o. female.  Patient with history of COPD not on home oxygen, history of coronary artery disease on aspirin, h/o diastolic HF --presents to the emergency department with complaint of generalized weakness for the past week with vomiting over the past 1 day.  Patient hasn't been able to eat or drink since vomiting began.  She has noted that her legs have become more swollen and erythematous, especially the left ankle and foot.  These have been weeping clear fluid.  She has been bandaging these at home.  Patient lives at home with her son.  She denies any chest pain or shortness of breath.  She has not had any abdominal pains.  No reported fevers, URI symptoms.  Per EMS, patient's legs with multiple bedbugs on arrival.      Past Medical History:  Diagnosis Date  . CAD (coronary artery disease)   . COPD (chronic obstructive pulmonary disease) (HCC)   . Hyperlipidemia   . Hypertension   . Myocardial infarction [facility]) 10/16/2013    Patient Active Problem List   Diagnosis Date Noted  . CAD, multiple vessel 10/30/2013  . Anxiety 10/30/2013  . Pre-op evaluation 10/17/2013  . Myocardial infarction (HCC) 10/16/2013  . Acute MI (HCC) 10/16/2013  . OVERWEIGHT 12/10/2009  . CIGARETTE SMOKER, has stopped smoking 12/10/2009  . Essential hypertension 12/10/2009  . DEGENERATIVE JOINT DISEASE 12/10/2009  . HOARSENESS, CHRONIC 12/10/2009  . CHEST PAIN UNSPECIFIED 12/10/2009  . Hyperlipidemia, mixed 01/24/2008  . ANXIETY 01/24/2008  . ALLERGIC RHINITIS 01/24/2008  . COPD  GOLD II based on fev1/VC of 58%  01/24/2008  . GERD 01/24/2008  . POSITIVE PPD 01/24/2008    Past Surgical History:  Procedure Laterality Date  . CARDIAC CATHETERIZATION  10/17/13   significant 3 vessel disease  . LEFT HEART CATHETERIZATION WITH CORONARY ANGIOGRAM N/A 10/17/2013   Procedure: LEFT HEART CATHETERIZATION WITH CORONARY ANGIOGRAM;  Surgeon: Lesleigh Noe, MD;  Location: [facility] CATH LAB;  Service: Cardiovascular;  Laterality: N/A;     OB History   None      Home Medications    Prior to Admission medications   Medication Sig Start Date End Date Taking? Authorizing Provider  acetaminophen (TYLENOL) 500 MG tablet Take 1,000 mg by mouth every 6 (six) hours as needed for mild pain.    [provider]  albuterol (PROAIR HFA) 108 (90 BASE) MCG/ACT inhaler 2 puffs every 4 hours as needed only  if your can't catch your breath 02/10/14   Nyoka Cowden, MD  ALPRAZolam Prudy Feeler) 0.25 MG tablet Take 1 tablet (0.25 mg total) by mouth 2 (two) times daily as needed for anxiety. 10/29/13   Leone Brand, NP  aspirin EC 81 MG EC tablet Take 1 tablet (81 mg total) by mouth daily. 10/21/13   Barrett, Joline Salt, PA-C  atorvastatin (LIPITOR) 80 MG tablet TAKE 1 TABLET EVERY DAY AT 6PM (KEEP OFFICE VISIT) 11/03/17   Croitoru, Rachelle Hora, MD  BYSTOLIC 10 MG tablet TAKE 1 TABLET EVERY DAY (KEEP OFFICE VISIT) 11/06/17   Croitoru, Rachelle Hora, MD  cetirizine (ZYRTEC) 10 MG tablet Take 10 mg by mouth daily.    [provider]  fenofibrate (TRICOR) 48 MG tablet Take 1 tablet (48 mg total)  by mouth daily. MUST KEEP UPCOMING APPT FOR REFILLS. 11/03/17   Croitoru, Mihai, MD  irbesartan (AVAPRO) 300 MG tablet TAKE 1 TABLET BY MOUTH EVERY DAY **KEEP OFFICE VISIT** 11/20/17   Croitoru, Mihai, MD  Multiple Vitamin (MULTIVITAMIN WITH MINERALS) TABS tablet Take 1 tablet by mouth daily.    [provider]  nitroGLYCERIN (NITROSTAT) 0.4 MG SL tablet Place 1 tablet (0.4 mg total) under the tongue every 5 (five) minutes as needed for chest pain. 06/10/15   Croitoru, Mihai, MD  omega-3 acid ethyl esters (LOVAZA) 1 G capsule Take 1,500 g by mouth daily.    [provider]    pantoprazole (PROTONIX) 40 MG tablet TAKE 1 TABLET EVERY DAY (KEEP OFFICE VISIT) 10/06/17   Croitoru, Mihai, MD  SPIRIVA HANDIHALER 18 MCG inhalation capsule Place 1 capsule into the nose as needed. 06/11/14   [provider]  triamcinolone (NASACORT ALLERGY 24HR) 55 MCG/ACT AERO nasal inhaler Place 2 sprays into the nose daily.    [provider]  vitamin E 400 UNIT capsule Take 400 Units by mouth daily.    [provider]    Family History Family History  Problem Relation Age of Onset  . Allergies Mother   . Breast cancer Mother   . Heart disease Father   . Heart disease Brother   . Rheum arthritis Brother     Social History Social History   Tobacco Use  . Smoking status: Former Smoker    Packs/day: 1.00    Years: 45.00    Pack years: 45.00    Last attempt to quit: 10/16/2013    Years since quitting: 4.1  . Smokeless tobacco: Never Used  Substance Use Topics  . Alcohol use: Yes    Comment: occasional  . Drug use: No     Allergies   Patient has no known allergies.   Review of Systems Review of Systems  Constitutional: Negative for fever.  HENT: Negative for rhinorrhea and sore throat.   Eyes: Negative for redness.  Respiratory: Negative for cough and shortness of breath.   Cardiovascular: Positive for leg swelling. Negative for chest pain.  Gastrointestinal: Positive for nausea and vomiting. Negative for abdominal pain and diarrhea.  Genitourinary: Negative for dysuria.  Musculoskeletal: Negative for myalgias.  Skin: Positive for wound. Negative for rash.  Neurological: Positive for weakness. Negative for headaches.     Physical Exam Updated Vital Signs BP (!) 96/43   Pulse 69   Resp 16   Wt 79.8 kg (176 lb)   SpO2 98%   BMI 33.25 kg/m   Physical Exam  Constitutional: She appears well-developed and well-nourished.  HENT:  Head: Normocephalic and atraumatic.  Mouth/Throat: Mucous membranes are normal. Mucous membranes are  not dry.  Lips dry, mucous membranes dry.   Eyes: Conjunctivae are normal. Right eye exhibits no discharge. Left eye exhibits no discharge.  Neck: Trachea normal and normal range of motion. Neck supple. Normal carotid pulses and no JVD present. No muscular tenderness present. Carotid bruit is not present. No tracheal deviation present.  Cardiovascular: Normal rate, regular rhythm, S1 normal, S2 normal, normal heart sounds and intact distal pulses. Exam reveals no decreased pulses.  No murmur heard. Pulmonary/Chest: Effort normal and breath sounds normal. No respiratory distress. She has no wheezes. She exhibits no tenderness.  Abdominal: Soft. Normal aorta and bowel sounds are normal. There is no tenderness. There is no rebound and no guarding.  Musculoskeletal: Normal range of motion. She exhibits edema.  Patient with 2+ pitting edema to the knees bilaterally.  Patient with extensive skin breakdown over the bilateral ankles, left greater than right.  There is associated redness and serous discharge.  Left side is most concerning for infection.  Neurological: She is alert.  Skin: Skin is warm and dry. Rash noted. She is not diaphoretic. No cyanosis. No pallor.  There are two areas, stage 1 decubitus ulcers without cellulitic changes.  Patient with punctate lesions noted on the bilateral upper extremities and around the waistline concerning for scabies.  Psychiatric: She has a normal mood and affect.  Nursing note and vitals reviewed.         ED Treatments / Results  Labs (all labs ordered are listed, but only abnormal results are displayed) Labs Reviewed  CBC WITH DIFFERENTIAL/PLATELET - Abnormal; Notable for the following components:      Result Value   RBC 3.85 (*)    Hemoglobin 10.8 (*)    HCT 33.4 (*)    RDW 16.8 (*)    Neutro Abs 8.1 (*)    All other components within normal limits  COMPREHENSIVE METABOLIC PANEL - Abnormal; Notable for the following components:   Sodium 124  (*)    Potassium 6.7 (*)    Chloride 95 (*)    CO2 18 (*)    Glucose, Bld 108 (*)    BUN 103 (*)    Creatinine, Ser 5.08 (*)    Calcium 8.4 (*)    Total Protein 6.4 (*)    Albumin 2.9 (*)    AST 14 (*)    ALT 9 (*)    GFR calc non Af Amer 8 (*)    GFR calc Af Amer 9 (*)    All other components within normal limits  URINE CULTURE  CULTURE, BLOOD (ROUTINE X 2)  CULTURE, BLOOD (ROUTINE X 2)  URINALYSIS, ROUTINE W REFLEX MICROSCOPIC  I-STAT TROPONIN, ED  I-STAT CG4 LACTIC ACID, ED  POC OCCULT BLOOD, ED    EKG EKG Interpretation  Date/Time:  Wednesday Nov 22 2017 19:18:08 EDT Ventricular Rate:  67 PR Interval:    QRS Duration: 117 QT Interval:  398 QTC Calculation: 421 R Axis:   -3 Text Interpretation:  Sinus rhythm Nonspecific intraventricular conduction delay Low voltage, extremity leads Previous biphasic TW changes in inferior and lateral leads now, Appearance of PR depressions lateral leads, less consistent  with st elevation.   Confirmed by Alvira Monday ([id number]) on 11/22/2017 7:33:07 PM   Radiology Dg Chest Portable 1 View  Result Date: 11/22/2017 CLINICAL DATA:  Weakness, nausea/vomiting x1 week EXAM: PORTABLE CHEST 1 VIEW COMPARISON:  01/28/2014 FINDINGS: Lungs are clear.  No pleural effusion or pneumothorax. The heart is top-normal in size. IMPRESSION: No evidence of acute cardiopulmonary disease. Electronically Signed   By: Charline Bills M.D.   On: 11/22/2017 20:40    Procedures Procedures (including critical care time)  Medications Ordered in ED Medications  sodium chloride 0.9 % bolus 1,000 mL (1,000 mLs Intravenous New Bag/Given 11/22/17 2214)  vancomycin (VANCOCIN) IVPB 1000 mg/200 mL premix (1,000 mg Intravenous New Bag/Given 11/22/17 2214)  sodium chloride 0.9 % bolus 1,000 mL (0 mLs Intravenous Stopped 11/22/17 2034)  sodium polystyrene (KAYEXALATE) 15 GM/60ML suspension 30 g (30 g Oral Given 11/22/17 2131)  insulin aspart (novoLOG) injection 10 Units (10  Units Intravenous Given 11/22/17 2129)  dextrose 50 % solution 50 mL (50 mLs Intravenous Given 11/22/17 2125)  sodium chloride 0.9 % bolus 1,000 mL (0 mLs  Intravenous Stopped 11/22/17 2200)  acetaminophen (TYLENOL) tablet 650 mg (650 mg Oral Given 11/22/17 2130)  ondansetron (ZOFRAN) injection 4 mg (4 mg Intravenous Given 11/22/17 2216)     Initial Impression / Assessment and Plan / ED Course  I have reviewed the triage vital signs and the nursing notes.  Pertinent labs & imaging results that were available during my care of the patient were reviewed by me and considered in my medical decision making (see chart for details).     Patient seen and examined. Work-up initiated.   Vital signs reviewed and are as follows: BP (!) 95/45   Pulse 66   Resp 17   Wt 79.8 kg (176 lb)   SpO2 98%   BMI 33.25 kg/m   EKG reviewed, will need repeat due to wandering baseline laterally.  Low BP noted.  Fluids ordered.  Repeat EKG without changes of hyperkalemia, lateral mild ST segment elevation noted however patient does not have any chest pain or shortness of breath at the current time.  Troponin 0.02.  Hyperkalemia and acute kidney injury noted.  Hyperkalemia treated with Kayexalate and insulin/D50.  Repeat EKG without signs of hyperkalemia.  Patient will be hydrated 3 L to cover for sepsis.  Lactic acid is normal.  Vancomycin ordered through pharmacy for lower extremity edema.  Hemoglobin noted lower than previous.  Hemoccult checked and is negative.  Patient will need admission.  Currently awaiting rectal temperature, UA.  10:13 PM Temp as noted. Will admit. Continuing hydration.  BP (!) 96/48   Pulse 70   Temp (!) 96.7 F (35.9 C) (Rectal)   Resp 20   Wt 79.8 kg (176 lb)   SpO2 96%   BMI 33.25 kg/m   10:34 PM Spoke with Dr. Sharyon Medicus who will see.   CRITICAL CARE Performed by: Carolee Rota Total critical care time: 40 minutes Critical care time was exclusive of separately billable  procedures and treating other patients. Critical care was necessary to treat or prevent imminent or life-threatening deterioration. Critical care was time spent personally by me on the following activities: development of treatment plan with patient and/or surrogate as well as nursing, discussions with consultants, evaluation of patient's response to treatment, examination of patient, obtaining history from patient or surrogate, ordering and performing treatments and interventions, ordering and review of laboratory studies, ordering and review of radiographic studies, pulse oximetry and re-evaluation of patient's condition.   Final Clinical Impressions(s) / ED Diagnoses   Final diagnoses:  Acute kidney injury (HCC)  Hyperkalemia  Sepsis, due to unspecified organism (HCC)  Cellulitis of left lower extremity  Infestation   Admit for multiple medical problems.   ED Discharge Orders    None       Renne Crigler, Cordelia Poche 11/22/17 2236    Alvira Monday, MD 11/25/17 1229

## 2017-11-22 NOTE — ED Notes (Signed)
Dead bed bugs placed at [facility].

## 2017-11-22 NOTE — ED Notes (Signed)
PA Josh made aware of high K+

## 2017-11-23 DIAGNOSIS — N179 Acute kidney failure, unspecified: Secondary | ICD-10-CM

## 2017-11-23 DIAGNOSIS — I959 Hypotension, unspecified: Secondary | ICD-10-CM | POA: Diagnosis present

## 2017-11-23 LAB — BASIC METABOLIC PANEL
ANION GAP: 7 (ref 5–15)
ANION GAP: 9 (ref 5–15)
BUN: 85 mg/dL — ABNORMAL HIGH (ref 6–20)
BUN: 82 mg/dL — ABNORMAL HIGH (ref 6–20)
CALCIUM: 7.7 mg/dL — AB (ref 8.9–10.3)
CHLORIDE: 105 mmol/L (ref 101–111)
CO2: 18 mmol/L — AB (ref 22–32)
CO2: 18 mmol/L — ABNORMAL LOW (ref 22–32)
CREATININE: 3.87 mg/dL — AB (ref 0.44–1.00)
Calcium: 7.9 mg/dL — ABNORMAL LOW (ref 8.9–10.3)
Chloride: 105 mmol/L (ref 101–111)
Creatinine, Ser: 3.25 mg/dL — ABNORMAL HIGH (ref 0.44–1.00)
GFR, EST AFRICAN AMERICAN: 13 mL/min — AB (ref >60)
GFR, EST AFRICAN AMERICAN: 16 mL/min — AB (ref >60)
GFR, EST NON AFRICAN AMERICAN: 11 mL/min — AB (ref >60)
GFR, EST NON AFRICAN AMERICAN: 14 mL/min — AB (ref >60)
GLUCOSE: 97 mg/dL (ref 65–99)
Glucose, Bld: 99 mg/dL (ref 65–99)
POTASSIUM: 5.9 mmol/L — AB (ref 3.5–5.1)
Potassium: 5.9 mmol/L — ABNORMAL HIGH (ref 3.5–5.1)
SODIUM: 132 mmol/L — AB (ref 135–145)
Sodium: 130 mmol/L — ABNORMAL LOW (ref 135–145)

## 2017-11-23 LAB — TSH: TSH: 1.299 u[IU]/mL (ref 0.350–4.500)

## 2017-11-23 LAB — LACTIC ACID, PLASMA: Lactic Acid, Venous: 0.9 mmol/L (ref 0.5–1.9)

## 2017-11-23 LAB — MRSA PCR SCREENING: MRSA BY PCR: NEGATIVE

## 2017-11-23 LAB — PROCALCITONIN: Procalcitonin: 32.3 ng/mL

## 2017-11-23 LAB — GLUCOSE, CAPILLARY: GLUCOSE-CAPILLARY: 94 mg/dL (ref 65–99)

## 2017-11-23 MED ORDER — ATORVASTATIN CALCIUM 80 MG PO TABS
80.0000 mg | ORAL_TABLET | Freq: Every day | ORAL | Status: DC
  Administered 2017-11-23 – 2017-12-05 (×13): 80 mg via ORAL
  Filled 2017-11-23 (×12): qty 1

## 2017-11-23 MED ORDER — HEPARIN SODIUM (PORCINE) 5000 UNIT/ML IJ SOLN
5000.0000 [IU] | Freq: Three times a day (TID) | INTRAMUSCULAR | Status: DC
  Administered 2017-11-23 – 2017-12-05 (×37): 5000 [IU] via SUBCUTANEOUS
  Filled 2017-11-23 (×36): qty 1

## 2017-11-23 MED ORDER — SODIUM CHLORIDE 0.9 % IV SOLN
0.0000 ug/min | INTRAVENOUS | Status: DC
  Administered 2017-11-24: 20 ug/min via INTRAVENOUS
  Administered 2017-11-24: 40 ug/min via INTRAVENOUS
  Administered 2017-11-24: 50 ug/min via INTRAVENOUS
  Administered 2017-11-24: 70 ug/min via INTRAVENOUS
  Administered 2017-11-24: 60 ug/min via INTRAVENOUS
  Administered 2017-11-25: 40 ug/min via INTRAVENOUS
  Administered 2017-11-26: 10 ug/min via INTRAVENOUS
  Administered 2017-11-26: 40 ug/min via INTRAVENOUS
  Administered 2017-11-27: 5 ug/min via INTRAVENOUS
  Administered 2017-11-27: 10 ug/min via INTRAVENOUS
  Administered 2017-11-27: 20 ug/min via INTRAVENOUS
  Administered 2017-11-28: 35 ug/min via INTRAVENOUS
  Filled 2017-11-23: qty 1
  Filled 2017-11-23: qty 10
  Filled 2017-11-23 (×2): qty 1
  Filled 2017-11-23 (×3): qty 10
  Filled 2017-11-23 (×4): qty 1
  Filled 2017-11-23: qty 10
  Filled 2017-11-23 (×2): qty 1
  Filled 2017-11-23: qty 10
  Filled 2017-11-23: qty 1
  Filled 2017-11-23: qty 10
  Filled 2017-11-23: qty 1
  Filled 2017-11-23: qty 10

## 2017-11-23 MED ORDER — ACETAMINOPHEN 500 MG PO TABS
1000.0000 mg | ORAL_TABLET | Freq: Four times a day (QID) | ORAL | Status: DC | PRN
  Administered 2017-11-24 – 2017-11-26 (×4): 1000 mg via ORAL
  Filled 2017-11-23 (×4): qty 2

## 2017-11-23 MED ORDER — ONDANSETRON HCL 4 MG PO TABS
4.0000 mg | ORAL_TABLET | Freq: Four times a day (QID) | ORAL | Status: DC | PRN
  Administered 2017-11-30 – 2017-12-04 (×3): 4 mg via ORAL
  Filled 2017-11-23 (×3): qty 1

## 2017-11-23 MED ORDER — SODIUM CHLORIDE 0.9 % IV SOLN
INTRAVENOUS | Status: DC
  Administered 2017-11-23 – 2017-11-24 (×4): via INTRAVENOUS
  Administered 2017-11-27: 100 mL/h via INTRAVENOUS

## 2017-11-23 MED ORDER — LORATADINE 10 MG PO TABS
10.0000 mg | ORAL_TABLET | Freq: Every day | ORAL | Status: DC
  Administered 2017-11-23 – 2017-11-28 (×6): 10 mg via ORAL
  Filled 2017-11-23 (×7): qty 1

## 2017-11-23 MED ORDER — PIPERACILLIN-TAZOBACTAM IN DEX 2-0.25 GM/50ML IV SOLN
2.2500 g | Freq: Three times a day (TID) | INTRAVENOUS | Status: DC
  Administered 2017-11-23 – 2017-11-24 (×3): 2.25 g via INTRAVENOUS
  Filled 2017-11-23 (×6): qty 50

## 2017-11-23 MED ORDER — ASPIRIN 81 MG PO CHEW
81.0000 mg | CHEWABLE_TABLET | Freq: Every day | ORAL | Status: DC
  Administered 2017-11-23 – 2017-12-05 (×13): 81 mg via ORAL
  Filled 2017-11-23 (×14): qty 1

## 2017-11-23 MED ORDER — GI COCKTAIL ~~LOC~~
30.0000 mL | Freq: Once | ORAL | Status: AC
  Administered 2017-11-23: 30 mL via ORAL
  Filled 2017-11-23: qty 30

## 2017-11-23 MED ORDER — NITROGLYCERIN 0.4 MG SL SUBL: 0.4000 mg | SUBLINGUAL_TABLET | SUBLINGUAL | Status: DC | PRN

## 2017-11-23 MED ORDER — SODIUM CHLORIDE 0.9 % IV BOLUS
1000.0000 mL | Freq: Once | INTRAVENOUS | Status: AC
  Administered 2017-11-23: 1000 mL via INTRAVENOUS

## 2017-11-23 MED ORDER — HYDROCODONE-ACETAMINOPHEN 5-325 MG PO TABS
1.0000 | ORAL_TABLET | ORAL | Status: DC | PRN
  Administered 2017-11-25: 1 via ORAL
  Filled 2017-11-23: qty 2
  Filled 2017-11-23 (×2): qty 1

## 2017-11-23 MED ORDER — TRIAMCINOLONE ACETONIDE 55 MCG/ACT NA AERO
2.0000 | INHALATION_SPRAY | Freq: Every day | NASAL | Status: DC
  Administered 2017-11-23 – 2017-12-05 (×13): 2 via NASAL
  Filled 2017-11-23: qty 21.6

## 2017-11-23 MED ORDER — ADULT MULTIVITAMIN W/MINERALS CH
1.0000 | ORAL_TABLET | Freq: Every day | ORAL | Status: DC
  Administered 2017-11-23 – 2017-12-05 (×13): 1 via ORAL
  Filled 2017-11-23 (×14): qty 1

## 2017-11-23 MED ORDER — FENOFIBRATE 54 MG PO TABS
54.0000 mg | ORAL_TABLET | Freq: Every day | ORAL | Status: DC
  Administered 2017-11-23 – 2017-12-05 (×13): 54 mg via ORAL
  Filled 2017-11-23 (×14): qty 1

## 2017-11-23 MED ORDER — NEBIVOLOL HCL 10 MG PO TABS: 10.0000 mg | ORAL_TABLET | Freq: Every day | ORAL | Status: DC

## 2017-11-23 MED ORDER — FAMOTIDINE 20 MG PO TABS
10.0000 mg | ORAL_TABLET | Freq: Every day | ORAL | Status: DC
  Administered 2017-11-23 – 2017-12-05 (×13): 10 mg via ORAL
  Filled 2017-11-23 (×14): qty 1

## 2017-11-23 MED ORDER — SODIUM POLYSTYRENE SULFONATE 15 GM/60ML PO SUSP
15.0000 g | Freq: Once | ORAL | Status: AC
  Administered 2017-11-23: 15 g via ORAL
  Filled 2017-11-23: qty 60

## 2017-11-23 MED ORDER — ONDANSETRON HCL 4 MG/2ML IJ SOLN: 4.0000 mg | Freq: Four times a day (QID) | INTRAMUSCULAR | Status: DC | PRN

## 2017-11-23 NOTE — ED Notes (Signed)
Dr Jomarie Longs aware of bp and that it really drops when pt is asleep states she will have critical care come and see her and c hange her bed to ICU

## 2017-11-23 NOTE — Progress Notes (Addendum)
PROGRESS NOTE    Beverly Wheeler  IOE:[id number] DOB: May 26, 1946 DOA: 11/22/2017 PCP: Patient, No Pcp Per  Brief Narrative:72 year old female former smoker (quit 4 years ago) with history of CAD with previous MI, COPD, hypertension who presented 5/1 with 1 week history of weakness, nausea vomiting, right greater than left lower extremity swelling with weeping ulcers.  In the emergency room she was noted to be in acute renal failure with creatinine of 5, and hyperkalemic with a potassium of 6.7. -despite 5 L of IV fluids, blood pressure remains in the 80 to 90 range although mentation has improved, PCCM consulted, may need central line/pressors  Assessment & Plan:     Hypotension/Shock -? Does not appear to be septic shock, unclear if hypovolemic shock -However blood pressure remains in the 80-90 systolic range despite improved mentation, after 5 L of IV fluids bolus -[facility] M consulted -Recheck lactate and pro-calcitonin -Broad-spectrum IV antibiotics with vancomycin and Zosyn -Follow-up blood cultures - holding Avapro and bisoprolol -check am cortisol, TSH ok   Acute kidney injury -Likely secondary to hypotension and possibly prerenal etiology -Creatinine improving with hydration -Holding ARB -Will consult renal if no further improvement noted  Possible bilateral lower extremity cellulitis versus chronic venous stasis -Empirically cover for cellulitis at this time due to hypotension -Follow blood cultures -Wound care consult  COPD -Stable, nebs when necessary  History of CAD and stent -No cardiac symptoms at this time, hold bisoprolol  Hyperkalemia -due to AKI, given kayexalate, give another dose now   DVT prophylaxis: Hep SQ Code Status: Full Code Family Communication: no family at bedside Disposition Plan: Home when stable  Consultants:  PCCM  Procedures:   Antimicrobials:  Antibiotics Given (last 72 hours)    Date/Time Action Medication Dose Rate   11/22/17 2214  New Bag/Given   vancomycin (VANCOCIN) IVPB 1000 mg/200 mL premix 1,000 mg 200 mL/hr   11/23/17 0026 New Bag/Given   ceFAZolin (ANCEF) IVPB 1 g/50 mL premix 1 g 100 mL/hr   11/23/17 1014 New Bag/Given   ceFAZolin (ANCEF) IVPB 1 g/50 mL premix 1 g 100 mL/hr      Subjective: -complains of weakness, blood pressure remains low despite 5 L of IV fluids  Objective: Vitals:   11/23/17 1130 11/23/17 1140 11/23/17 1200 11/23/17 1230  BP: (!) 74/40 (!) 68/51 (!) 89/43 (!) 92/58  Pulse: (!) 58 62 67 73  Resp: 16 13 16 18   Temp:      TempSrc:      SpO2: 98% 98% 100% 98%  Weight:        Intake/Output Summary (Last 24 hours) at 11/23/2017 1252 Last data filed at 11/23/2017 0056 Gross per 24 hour  Intake 4250 ml  Output -  Net 4250 ml   Filed Weights   11/22/17 1851  Weight: 79.8 kg (176 lb)    Examination:  General exam: Appears calm and comfortable, no distress Respiratory system: movement, decreased breath sounds both bases Cardiovascular system: S1 & S2 heard, RRR. No JVD, murmurs, rubs, gallops Gastrointestinal system: Abdomen is nondistended, soft and nontender.Normal bowel sounds heard. Central nervous system: Alert and oriented. No focal neurological deficits. Extremities: . Lower legs with erythema near the ankle, weeping wounds, mild tenderness and warmth, diffuse scaling Skin: as above Psychiatry: Judgement and insight appear normal. Mood & affect appropriate.     Data Reviewed:   CBC: Recent Labs  Lab 11/22/17 1937  WBC 10.2  NEUTROABS 8.1*  HGB 10.8*  HCT 33.4*  MCV 86.8  PLT 259   Basic Metabolic Panel: Recent Labs  Lab 11/22/17 1937 11/23/17 0554  NA 124* 130*  K 6.7* 5.9*  CL 95* 105  CO2 18* 18*  GLUCOSE 108* 97  BUN 103* 85*  CREATININE 5.08* 3.87*  CALCIUM 8.4* 7.7*   GFR: CrCl cannot be calculated (Unknown ideal weight.). Liver Function Tests: Recent Labs  Lab 11/22/17 1937  AST 14*  ALT 9*  ALKPHOS 59  BILITOT 0.4  PROT 6.4*    ALBUMIN 2.9*   No results for input(s): LIPASE, AMYLASE in the last 168 hours. No results for input(s): AMMONIA in the last 168 hours. Coagulation Profile: No results for input(s): INR, PROTIME in the last 168 hours. Cardiac Enzymes: No results for input(s): CKTOTAL, CKMB, CKMBINDEX, TROPONINI in the last 168 hours. BNP (last 3 results) No results for input(s): PROBNP in the last 8760 hours. HbA1C: No results for input(s): HGBA1C in the last 72 hours. CBG: No results for input(s): GLUCAP in the last 168 hours. Lipid Profile: No results for input(s): CHOL, HDL, LDLCALC, TRIG, CHOLHDL, LDLDIRECT in the last 72 hours. Thyroid Function Tests: Recent Labs    11/23/17 0554  TSH 1.299   Anemia Panel: No results for input(s): VITAMINB12, FOLATE, FERRITIN, TIBC, IRON, RETICCTPCT in the last 72 hours. Urine analysis:    Component Value Date/Time   COLORURINE YELLOW 11/22/2017 2317   APPEARANCEUR HAZY (A) 11/22/2017 2317   LABSPEC 1.016 11/22/2017 2317   PHURINE 5.0 11/22/2017 2317   GLUCOSEU NEGATIVE 11/22/2017 2317   GLUCOSEU NEGATIVE 05/26/2009 1007   HGBUR NEGATIVE 11/22/2017 2317   BILIRUBINUR NEGATIVE 11/22/2017 2317   KETONESUR NEGATIVE 11/22/2017 2317   PROTEINUR NEGATIVE 11/22/2017 2317   UROBILINOGEN 0.2 10/16/2013 1735   NITRITE NEGATIVE 11/22/2017 2317   LEUKOCYTESUR TRACE (A) 11/22/2017 2317   Sepsis Labs: @LABRCNTIP (procalcitonin:4,lacticidven:4)  )No results found for this or any previous visit (from the past 240 hour(s)).       Radiology Studies: Dg Chest Portable 1 View  Result Date: 11/22/2017 CLINICAL DATA:  Weakness, nausea/vomiting x1 week EXAM: PORTABLE CHEST 1 VIEW COMPARISON:  01/28/2014 FINDINGS: Lungs are clear.  No pleural effusion or pneumothorax. The heart is top-normal in size. IMPRESSION: No evidence of acute cardiopulmonary disease. Electronically Signed   By: Charline Bills M.D.   On: 11/22/2017 20:40        Scheduled Meds: .  aspirin  81 mg Oral Daily  . atorvastatin  80 mg Oral q1800  . famotidine  10 mg Oral Daily  . fenofibrate  54 mg Oral Daily  . heparin  5,000 Units Subcutaneous Q8H  . loratadine  10 mg Oral Daily  . multivitamin with minerals  1 tablet Oral Daily  . triamcinolone  2 spray Nasal Daily   Continuous Infusions: . sodium chloride 100 mL/hr at 11/23/17 1010  . piperacillin-tazobactam (ZOSYN)  IV    . vancomycin Stopped (11/22/17 2314)     LOS: 1 day    Time spent:    Zannie Cove, MD [facility] Page via [email or web address], password [email or web address] After 7PM please contact night-coverage  11/23/2017, 12:52 PM

## 2017-11-23 NOTE — ED Notes (Signed)
Pt was sleeping soundly, awaken to take po meds, which she did well

## 2017-11-23 NOTE — ED Notes (Signed)
Pt sleeping well

## 2017-11-23 NOTE — ED Notes (Signed)
Pt got  Breakfast but wants to sleep, states has been up all night,

## 2017-11-23 NOTE — ED Notes (Signed)
Lunch tray provided. 

## 2017-11-23 NOTE — Progress Notes (Signed)
Patient is sleeping comfortably and is hard to wake from sleep.. Awakes to intense verbal stimulation or sternal rub. When awake patient is alert and oriented to self, place and situation. Asked where her son was  She replied "he is in his room". Able to follow simple commands. Soft bp has not change from previous numbers. Current bp in 70/40.

## 2017-11-23 NOTE — ED Notes (Signed)
Hijazi, MD aware of pt's hypotension and does not wish additional fluids at this time. Pt request verbal order for foley catheter placement r/t accurate I&Os.

## 2017-11-23 NOTE — ED Notes (Addendum)
Spoke to admit dr  That states she was paged at 615 am and she was not on till 7, someone had called the wrong dr, dr notified that pts bp is still low 81/ 5o and had been low before,  Iv fluid bolus started as ordered, pt states she lives with her son, has very red scaly lower legs, long dirty broken fingernails, states she was told of bedbugs,  Dr also upset that I and O was not kept on this pt that  Had 3 l of fluid

## 2017-11-23 NOTE — ED Notes (Signed)
Admit dr into see pt  Manual bp 92/50 dr aware

## 2017-11-23 NOTE — Progress Notes (Signed)
Pharmacy Antibiotic Note  Beverly Wheeler is a 72 y.o. female admitted on 11/22/2017 with cellulitis.  Pharmacy consulted for vancomycin and Zosyn dosing.   WBC WNL, no temp documented. Scr down to 3.8, previous baseline < 1 in 2015. Estimated CrCl ~ 15-50mL/min.  Plan: Vancomycin 1000mg  IV q48h. Goal trough 10-15 mcg/mL Zosyn 2.25g IV q8h F/u renal function and adjust as necessary - may need to hold and dose per levels F/u C&S, clinical status, de-escalation, LOT  Weight: 176 lb (79.8 kg)  Temp (24hrs), Avg:97.7 F (36.5 C), Min:96.7 F (35.9 C), Max:98.7 F (37.1 C)  Recent Labs  Lab 11/22/17 1937 11/22/17 1946 11/23/17 0554  WBC 10.2  --   --   CREATININE 5.08*  --  3.87*  LATICACIDVEN  --  0.86  --     CrCl cannot be calculated (Unknown ideal weight.).    No Known Allergies  Antimicrobials this admission: Vanc 5/1 >>  Cefazolin 5/1>>5/2 Zosyn 5/2>>  Dose adjustments this admission: None  Microbiology results: 5/1 UCx:  5/1 BCx:  Thank you for allowing pharmacy to be a part of this patient's care.  Gal Feldhaus D. Nawaf Strange, PharmD, BCPS Clinical Pharmacist Clinical Phone for 11/23/2017 until 3:30pm: [phone] If after 3:30pm, please call main pharmacy at [phone] 11/23/2017 11:43 AM

## 2017-11-23 NOTE — Consult Note (Signed)
PULMONARY / CRITICAL CARE MEDICINE   Name: Beverly Wheeler MRN: [id number] DOB: February 19, 1946    ADMISSION DATE:  11/22/2017 CONSULTATION DATE:  5/2  REFERRING MD:  [facility]   CHIEF COMPLAINT:  Hypotension   HISTORY OF PRESENT ILLNESS:   72 year old female former smoker (quit 4 years ago) with history of CAD with previous MI, COPD, hypertension who presented 5/1 with 1 week history of weakness, nausea vomiting, right greater than left lower extremity swelling with weeping ulcers.  In the emergency room she was noted to be in acute renal failure with creatinine of 5, and hyperkalemic with a potassium of 6.7.  She was initially admitted by the hospitalist with acute renal failure, lower extremity cellulitis.  She remained in the ER overnight where she has had persistent hypotension with systolic blood pressure in the 60s.  PCCM consulted for potential ICU admission.   She currently has no complaints.  She is awake and alert in the ER, sitting up eating lunch in bed. She denies chest pain, shortness of breath, lightheadedness, syncope She does complain of BLE edema, redness, pain. She still feels weak but does feel a bit better.   She has had approximately 4 L of IV fluids given in ER.  She is making urine.  Serum creatinine is much improved 5.08->3.87 and potassium is improved after IV insulin, D50 from 6.7->5.9.   PAST MEDICAL HISTORY :  She  has a past medical history of CAD (coronary artery disease), COPD (chronic obstructive pulmonary disease) (HCC), Hyperlipidemia, Hypertension, and Myocardial infarction (HCC) (10/16/2013).  PAST SURGICAL HISTORY: She  has a past surgical history that includes Cardiac catheterization (10/17/13) and left heart catheterization with coronary angiogram (N/A, 10/17/2013).  No Known Allergies  No current facility-administered medications on file prior to encounter.    Current Outpatient Medications on File Prior to Encounter  Medication Sig  . acetaminophen  (TYLENOL) 500 MG tablet Take 1,000 mg by mouth every 6 (six) hours as needed for mild pain.  [name] aspirin EC 81 MG EC tablet Take 1 tablet (81 mg total) by mouth daily.  [name] atorvastatin (LIPITOR) 80 MG tablet TAKE 1 TABLET EVERY DAY AT 6PM (KEEP OFFICE VISIT)  . BYSTOLIC 10 MG tablet TAKE 1 TABLET EVERY DAY (KEEP OFFICE VISIT)  . cetirizine (ZYRTEC) 10 MG tablet Take 10 mg by mouth daily.  . fenofibrate (TRICOR) 48 MG tablet Take 1 tablet (48 mg total) by mouth daily. MUST KEEP UPCOMING APPT FOR REFILLS.  [name] irbesartan (AVAPRO) 300 MG tablet TAKE 1 TABLET BY MOUTH EVERY DAY **KEEP OFFICE VISIT**  . Multiple Vitamin (MULTIVITAMIN WITH MINERALS) TABS tablet Take 1 tablet by mouth daily.  . nitroGLYCERIN (NITROSTAT) 0.4 MG SL tablet Place 1 tablet (0.4 mg total) under the tongue every 5 (five) minutes as needed for chest pain.  . ranitidine (ZANTAC) 150 MG tablet Take 150 mg by mouth 2 (two) times daily.  [name] triamcinolone (NASACORT ALLERGY 24HR) 55 MCG/ACT AERO nasal inhaler Place 2 sprays into the nose daily.    FAMILY HISTORY:  Her indicated that her mother is deceased. She indicated that her father is deceased.   SOCIAL HISTORY: She  reports that she quit smoking about 4 years ago. She has a 45.00 pack-year smoking history. She has never used smokeless tobacco. She reports that she drinks alcohol. She reports that she does not use drugs.  REVIEW OF SYSTEMS:   As per HPI - All other systems reviewed and were neg.    SUBJECTIVE:  VITAL SIGNS: BP (!) 68/51   Pulse 62   Temp 98.7 F (37.1 C) (Oral)   Resp 13   Wt 79.8 kg (176 lb)   SpO2 98%   BMI 33.25 kg/m   HEMODYNAMICS:    VENTILATOR SETTINGS:    INTAKE / OUTPUT: I/O last 3 completed shifts: In: 4250 [I.V.:1000; IV Piggyback:3250] Out: -   PHYSICAL EXAMINATION: General: Chronically ill-appearing female, appears older than stated age, no acute distress seen in the ER, sitting up in bed eating lunch Neuro: Awake, alert,  appropriate HEENT: Mucous membranes moist, no JVD Cardiovascular: S1-S2 regular rate and rhythm, normal sinus rhythm in the 70s Lungs: Respirations are even and unlabored on room air, clear Abdomen: Soft, nontender, positive bowel sounds  Musculoskeletal:  warm, right greater than left BLE erythema, weeping wounds,   LABS:  BMET Recent Labs  Lab 11/22/17 1937 11/23/17 0554  NA 124* 130*  K 6.7* 5.9*  CL 95* 105  CO2 18* 18*  BUN 103* 85*  CREATININE 5.08* 3.87*  GLUCOSE 108* 97    Electrolytes Recent Labs  Lab 11/22/17 1937 11/23/17 0554  CALCIUM 8.4* 7.7*    CBC Recent Labs  Lab 11/22/17 1937  WBC 10.2  HGB 10.8*  HCT 33.4*  PLT 259    Coag's No results for input(s): APTT, INR in the last 168 hours.  Sepsis Markers Recent Labs  Lab 11/22/17 1946  LATICACIDVEN 0.86    ABG No results for input(s): PHART, PCO2ART, PO2ART in the last 168 hours.  Liver Enzymes Recent Labs  Lab 11/22/17 1937  AST 14*  ALT 9*  ALKPHOS 59  BILITOT 0.4  ALBUMIN 2.9*    Cardiac Enzymes No results for input(s): TROPONINI, PROBNP in the last 168 hours.  Glucose No results for input(s): GLUCAP in the last 168 hours.  Imaging Dg Chest Portable 1 View  Result Date: 11/22/2017 CLINICAL DATA:  Weakness, nausea/vomiting x1 week EXAM: PORTABLE CHEST 1 VIEW COMPARISON:  01/28/2014 FINDINGS: Lungs are clear.  No pleural effusion or pneumothorax. The heart is top-normal in size. IMPRESSION: No evidence of acute cardiopulmonary disease. Electronically Signed   By: Charline Bills M.D.   On: 11/22/2017 20:40     STUDIES:    CULTURES: Urine 5/1 >> BC X2 5/1 >>  ANTIBIOTICS: Vancomycin 5/1 >> Zosyn 5/2 >>  SIGNIFICANT EVENTS:   LINES/TUBES:   DISCUSSION: 72 year old female with history of CAD, hypertension admitted 5/1 with acute renal failure, hyperkalemia, hypotension.  ASSESSMENT / PLAN:  Acute renal failure Hyperkalemia Plan- Continue gentle  volume Follow-up chemistry Monitor urine output closely Kayexalate UA pending Consider renal input although she seems to be rapidly improving with volume Hold home ARB  Hypotension-improved.  Unsure if this is totally accurate given perfect mental status, improving creatinine, good urine output.  Suspect this is much more volume related than sepsis.  Likely related to significant dehydration and home antihypertensives. Plan- Okay to monitor closely on stepdown unit per [facility]ad No need for pressors at this time Continue gentle volume as above Hold home bisoprolol, Avapro   BLE?  Cellulitis -suspect underlying peripheral vascular disease. Plan- Continue broad-spectrum antibiotics for now Follow cultures If cultures negative in next 24-48 hours can likely DC antibiotics altogether Wound care Likely needs further PVD work-up   FAMILY  - Updates: Patient updated at bedside 5/2 in ER   Dirk Dress, NP 11/23/2017  12:18 PM Pager: [phone] or [phone]

## 2017-11-23 NOTE — ED Notes (Signed)
Paged Mitchel Honour, MD regarding pt's continued hypotension. No response at this time.

## 2017-11-24 ENCOUNTER — Other Ambulatory Visit: Payer: Self-pay

## 2017-11-24 ENCOUNTER — Inpatient Hospital Stay (HOSPITAL_COMMUNITY): Payer: Medicare Other

## 2017-11-24 DIAGNOSIS — R6521 Severe sepsis with septic shock: Secondary | ICD-10-CM

## 2017-11-24 DIAGNOSIS — A419 Sepsis, unspecified organism: Secondary | ICD-10-CM

## 2017-11-24 DIAGNOSIS — N179 Acute kidney failure, unspecified: Secondary | ICD-10-CM

## 2017-11-24 DIAGNOSIS — L03116 Cellulitis of left lower limb: Secondary | ICD-10-CM

## 2017-11-24 DIAGNOSIS — R571 Hypovolemic shock: Secondary | ICD-10-CM

## 2017-11-24 DIAGNOSIS — I340 Nonrheumatic mitral (valve) insufficiency: Secondary | ICD-10-CM

## 2017-11-24 DIAGNOSIS — L8990 Pressure ulcer of unspecified site, unspecified stage: Secondary | ICD-10-CM

## 2017-11-24 LAB — BASIC METABOLIC PANEL
ANION GAP: 8 (ref 5–15)
BUN: 74 mg/dL — ABNORMAL HIGH (ref 6–20)
CALCIUM: 8.2 mg/dL — AB (ref 8.9–10.3)
CO2: 19 mmol/L — ABNORMAL LOW (ref 22–32)
Chloride: 108 mmol/L (ref 101–111)
Creatinine, Ser: 2.71 mg/dL — ABNORMAL HIGH (ref 0.44–1.00)
GFR, EST AFRICAN AMERICAN: 19 mL/min — AB (ref >60)
GFR, EST NON AFRICAN AMERICAN: 17 mL/min — AB (ref >60)
Glucose, Bld: 105 mg/dL — ABNORMAL HIGH (ref 65–99)
Potassium: 5.2 mmol/L — ABNORMAL HIGH (ref 3.5–5.1)
SODIUM: 135 mmol/L (ref 135–145)

## 2017-11-24 LAB — BLOOD CULTURE ID PANEL (REFLEXED)
Acinetobacter baumannii: NOT DETECTED
CANDIDA ALBICANS: NOT DETECTED
CANDIDA GLABRATA: NOT DETECTED
Candida krusei: NOT DETECTED
Candida parapsilosis: NOT DETECTED
Candida tropicalis: NOT DETECTED
ENTEROBACTER CLOACAE COMPLEX: NOT DETECTED
ENTEROCOCCUS SPECIES: NOT DETECTED
Enterobacteriaceae species: NOT DETECTED
Escherichia coli: NOT DETECTED
Haemophilus influenzae: NOT DETECTED
Klebsiella oxytoca: NOT DETECTED
Klebsiella pneumoniae: NOT DETECTED
Listeria monocytogenes: NOT DETECTED
Neisseria meningitidis: NOT DETECTED
PROTEUS SPECIES: NOT DETECTED
Pseudomonas aeruginosa: NOT DETECTED
STAPHYLOCOCCUS SPECIES: NOT DETECTED
Serratia marcescens: NOT DETECTED
Staphylococcus aureus (BCID): NOT DETECTED
Streptococcus agalactiae: NOT DETECTED
Streptococcus pneumoniae: NOT DETECTED
Streptococcus pyogenes: NOT DETECTED
Streptococcus species: NOT DETECTED

## 2017-11-24 LAB — CBC
HCT: 32.5 % — ABNORMAL LOW (ref 36.0–46.0)
Hemoglobin: 10.3 g/dL — ABNORMAL LOW (ref 12.0–15.0)
MCH: 28.1 pg (ref 26.0–34.0)
MCHC: 31.7 g/dL (ref 30.0–36.0)
MCV: 88.6 fL (ref 78.0–100.0)
Platelets: 240 10*3/uL (ref 150–400)
RBC: 3.67 MIL/uL — AB (ref 3.87–5.11)
RDW: 17.2 % — AB (ref 11.5–15.5)
WBC: 10.2 10*3/uL (ref 4.0–10.5)

## 2017-11-24 LAB — ECHOCARDIOGRAM COMPLETE
HEIGHTINCHES: 61 in
WEIGHTICAEL: 2772.5 [oz_av]

## 2017-11-24 LAB — PROCALCITONIN: Procalcitonin: 13.75 ng/mL

## 2017-11-24 LAB — URINE CULTURE

## 2017-11-24 LAB — CORTISOL-AM, BLOOD: CORTISOL - AM: 24.5 ug/dL — AB (ref 6.7–22.6)

## 2017-11-24 MED ORDER — ZINC OXIDE 40 % EX OINT
TOPICAL_OINTMENT | Freq: Every morning | CUTANEOUS | Status: DC
  Administered 2017-11-24 – 2017-11-27 (×4): via TOPICAL
  Administered 2017-11-28: 1 via TOPICAL
  Administered 2017-11-29 – 2017-12-05 (×7): via TOPICAL
  Filled 2017-11-24 (×3): qty 113

## 2017-11-24 MED ORDER — PERFLUTREN LIPID MICROSPHERE
1.0000 mL | INTRAVENOUS | Status: AC | PRN
  Administered 2017-11-24: 3 mL via INTRAVENOUS
  Filled 2017-11-24: qty 10

## 2017-11-24 MED ORDER — ALUM & MAG HYDROXIDE-SIMETH 200-200-20 MG/5ML PO SUSP
30.0000 mL | ORAL | Status: DC | PRN
  Administered 2017-11-24 – 2017-12-05 (×13): 30 mL via ORAL
  Filled 2017-11-24 (×13): qty 30

## 2017-11-24 MED ORDER — PNEUMOCOCCAL VAC POLYVALENT 25 MCG/0.5ML IJ INJ
0.5000 mL | INJECTION | INTRAMUSCULAR | Status: AC
  Administered 2017-11-27: 0.5 mL via INTRAMUSCULAR
  Filled 2017-11-24: qty 0.5

## 2017-11-24 MED ORDER — SODIUM CHLORIDE 0.9 % IV BOLUS
1000.0000 mL | Freq: Once | INTRAVENOUS | Status: AC
  Administered 2017-11-24: 1000 mL via INTRAVENOUS

## 2017-11-24 MED ORDER — AMMONIUM LACTATE 12 % EX LOTN
TOPICAL_LOTION | Freq: Every morning | CUTANEOUS | Status: DC
  Administered 2017-11-24 – 2017-11-27 (×4): via TOPICAL
  Administered 2017-11-28: 1 via TOPICAL
  Administered 2017-11-29 – 2017-12-05 (×7): via TOPICAL
  Filled 2017-11-24 (×2): qty 400

## 2017-11-24 MED ORDER — ORAL CARE MOUTH RINSE
15.0000 mL | Freq: Two times a day (BID) | OROMUCOSAL | Status: DC
  Administered 2017-11-24 – 2017-12-05 (×18): 15 mL via OROMUCOSAL

## 2017-11-24 NOTE — Plan of Care (Signed)
  Problem: Education: Goal: Knowledge of General Education information will improve Outcome: Progressing   Problem: Health Behavior/Discharge Planning: Goal: Ability to manage health-related needs will improve Outcome: Progressing   Problem: Clinical Measurements: Goal: Ability to maintain clinical measurements within normal limits will improve Outcome: Progressing Goal: Will remain free from infection Outcome: Progressing Goal: Diagnostic test results will improve Outcome: Progressing Note:  Creatinine trending down since admission Goal: Respiratory complications will improve Outcome: Progressing Goal: Cardiovascular complication will be avoided Outcome: Progressing   Problem: Activity: Goal: Risk for activity intolerance will decrease Outcome: Progressing   Problem: Nutrition: Goal: Adequate nutrition will be maintained Outcome: Progressing   Problem: Coping: Goal: Level of anxiety will decrease Outcome: Progressing   Problem: Elimination: Goal: Will not experience complications related to bowel motility Outcome: Progressing Goal: Will not experience complications related to urinary retention Outcome: Progressing   Problem: Pain Managment: Goal: General experience of comfort will improve Outcome: Progressing   Problem: Safety: Goal: Ability to remain free from injury will improve Outcome: Progressing   Problem: Skin Integrity: Goal: Risk for impaired skin integrity will decrease Outcome: Progressing   Problem: Spiritual Needs Goal: Ability to function at adequate level Outcome: Progressing   Problem: Education: Goal: Knowledge of disease and its progression will improve Outcome: Progressing   Problem: Health Behavior/Discharge Planning: Goal: Ability to manage health-related needs will improve Outcome: Progressing   Problem: Clinical Measurements: Goal: Complications related to the disease process or treatment will be avoided or minimized Outcome:  Progressing Goal: Dialysis access will remain free of complications Outcome: Progressing   Problem: Activity: Goal: Activity intolerance will improve Outcome: Progressing   Problem: Fluid Volume: Goal: Fluid volume balance will be maintained or improved Outcome: Progressing   Problem: Nutritional: Goal: Ability to make appropriate dietary choices will improve Outcome: Progressing   Problem: Respiratory: Goal: Respiratory symptoms related to disease process will be avoided Outcome: Progressing   Problem: Self-Concept: Goal: Body image disturbance will be avoided or minimized Outcome: Progressing   Problem: Urinary Elimination: Goal: Progression of disease will be identified and treated Outcome: Progressing

## 2017-11-24 NOTE — Progress Notes (Signed)
PULMONARY / CRITICAL CARE MEDICINE   Name: Beverly Wheeler MRN: [id number] DOB: [birth year]    ADMISSION DATE:  11/22/2017 CONSULTATION DATE:  5/2  REFERRING MD:  [facility]   CHIEF COMPLAINT:  Hypotension   HISTORY OF PRESENT ILLNESS:   72 year old female former smoker (quit 4 years ago) with history of CAD with previous MI, COPD, hypertension who presented 5/1 with 1 week history of weakness, nausea vomiting, right greater than left lower extremity swelling with weeping ulcers.  In the emergency room she was noted to be in acute renal failure with creatinine of 5, and hyperkalemic with a potassium of 6.7.  She was initially admitted by the hospitalist with acute renal failure, lower extremity cellulitis.  She remained in the ER overnight where she has had persistent hypotension with systolic blood pressure in the 60s.  PCCM consulted for potential ICU admission.   She currently has no complaints.  She is awake and alert in the ER, sitting up eating lunch in bed. She denies chest pain, shortness of breath, lightheadedness, syncope She does complain of BLE edema, redness, pain. She still feels weak but does feel a bit better.   She has had approximately 4 L of IV fluids given in ER.  She is making urine.  Serum creatinine is much improved 5.08->3.87 and potassium is improved after IV insulin, D50 from 6.7->5.9.      SUBJECTIVE:  Awake alert no acute distress  VITAL SIGNS: BP (!) 124/59   Pulse 86   Temp 98.6 F (37 C) (Oral)   Resp 17   Ht 5\' 1"  (1.549 m)   Wt 78.6 kg (173 lb 4.5 oz)   SpO2 95%   BMI 32.74 kg/m   HEMODYNAMICS:    VENTILATOR SETTINGS:    INTAKE / OUTPUT: I/O last 3 completed shifts: In: 7389.8 [I.V.:3989.8; IV Piggyback:3400] Out: 780 [Urine:780]  PHYSICAL EXAMINATION: General: 72 year old female appears older than stated age with her course raspy voice. HEENT: No JVD or lymphadenopathy is appreciated Neuro: Appears intact follows commands CV: Sounds are  regular, sinus rhythm 82 despite being hypotensive and on Neo-Synephrine PULM: even/non-labored, lungs bilaterally managed WU:JWJX, non-tender, bsx4 active  Extremities: Lower extremities with cellulitic changes and wraps in place Skin: no rashes or lesions    LABS:  BMET Recent Labs  Lab 11/23/17 0554 11/23/17 1834 11/24/17 0438  NA 130* 132* 135  K 5.9* 5.9* 5.2*  CL 105 105 108  CO2 18* 18* 19*  BUN 85* 82* 74*  CREATININE 3.87* 3.25* 2.71*  GLUCOSE 97 99 105*    Electrolytes Recent Labs  Lab 11/23/17 0554 11/23/17 1834 11/24/17 0438  CALCIUM 7.7* 7.9* 8.2*    CBC Recent Labs  Lab 11/22/17 1937 11/24/17 0438  WBC 10.2 10.2  HGB 10.8* 10.3*  HCT 33.4* 32.5*  PLT 259 240    Coag's No results for input(s): APTT, INR in the last 168 hours.  Sepsis Markers Recent Labs  Lab 11/22/17 1946 11/23/17 1145 11/24/17 0438  LATICACIDVEN 0.86 0.9  --   PROCALCITON  --  32.30 13.75    ABG No results for input(s): PHART, PCO2ART, PO2ART in the last 168 hours.  Liver Enzymes Recent Labs  Lab 11/22/17 1937  AST 14*  ALT 9*  ALKPHOS 59  BILITOT 0.4  ALBUMIN 2.9*    Cardiac Enzymes No results for input(s): TROPONINI, PROBNP in the last 168 hours.  Glucose Recent Labs  Lab 11/23/17 2153  GLUCAP 94    Imaging No results  found.   STUDIES:  11/25/1998 nineteen 2D echo>>  CULTURES: Urine 5/1 >> multiple species recollected>> BC X2 5/1 >>  ANTIBIOTICS: Vancomycin 5/1 >> Zosyn 5/2 >>  SIGNIFICANT EVENTS:   LINES/TUBES:   DISCUSSION: 72 year old with history of coronary artery disease status post MI 2015 is admitted 5 1 with acute renal failure hypotension.  She is currently up +3.2 L but still appears intravascularly dry.  Continue fluid resuscitation no improvement in renal function and potassium.  Note she has not had a 2D echo since 2015.  ASSESSMENT / PLAN:  Intake/Output Summary (Last 24 hours) at 11/24/2017 1102 Last data filed at  11/24/2017 1000 Gross per 24 hour  Intake 4009.83 ml  Output 780 ml  Net 3229.83 ml   Acute renal failure Hyperkalemia Lab Results  Component Value Date   CREATININE 2.71 (H) 11/24/2017   CREATININE 3.25 (H) 11/23/2017   CREATININE 3.87 (H) 11/23/2017   CREATININE 0.94 02/20/2014   CREATININE 0.77 10/29/2013   Recent Labs  Lab 11/23/17 0554 11/23/17 1834 11/24/17 0438  K 5.9* 5.9* 5.2*    Plan- Continue volume resuscitation Note potassium is slowly improving She may need renal renal input for the future  Hypotension-improved.  Unsure if this is totally accurate given perfect mental status, improving creatinine, good urine output.  Suspect this is much more volume related than sepsis.  Likely related to significant dehydration and home antihypertensives. Plan- Currently on Neo-Synephrine but wean as tolerated Receiving another fluid bolus noted her creatinine is improving with fluid suspect she is intravascularly dry Continue to monitor she will remain with [facility] service and stepdown service Hold antihypertensives 11/24/2017 check 2D echo for completeness    BLE?  Cellulitis -suspect underlying peripheral vascular disease. Plan- Continue vancomycin and Zosyn currently day 1 Follow culture data If cultures are negative can DC antibiotics Wound care Suspect she needs peripheral vascular disease work-up in the near future   FAMILY  - Updates: Patient updated at bedside on 11/24/2017   [facility] Kamea Dacosta ACNP Adolph Pollack PCCM Pager [phone] till 1 pm If no answer page [phone] 11/24/2017, 10:59 AM

## 2017-11-24 NOTE — Consult Note (Signed)
WOC Nurse wound consult note Reason for Consult: Sacral wound and leg wounds Wound types: There are wounds on bilateral buttocks where the gluteal folds meet.  The patient stated that prior to coming to the hospital she sat in one position a lot.  She states she knows when she needs to defecate.  Her bed mobility at the time of my assessment is poor.  The right buttock fold margin has a wound that measures 3.5 cm x 2 cm x 0.2 cm.  It is 100% pink, clean, no odor, scant drainage.   The left buttock fold margin has one area that is open and measures 1.5 cm x 1 cm x 0.1 cm and above that several small, scattered areas that are superficial.  There was a foam dressing covering the areas at the time of my assessment in room 2H13.  There is blanchable redness to the buttock, sacral, coccyx areas.  The surrounding skin is intact with evidence of MASD.  My opinion is that the open areas on the gluteal folds are consistent with MASD.  On the left lower leg, posterior aspect of the calf there is evidence of weeping serosanguinous drainage on the disposable pad beneath her legs.  The posterior portion of the calf is denuded.  No wounds were noted on the right lower leg.  Both lower legs have changes consistent with venous insufficiency including crusting, hemosiderin staining, and edema.  Plan of care for the buttocks:  Desitin 40% to buttocks for extra protection from moisture, as well as to the open areas.  Continue the use of the foam dressings over the wounds.  Change every 3 days and prn.  It is okay to apply the desitin more often if needed.  Plan of care for the lower legs:  Gently wash with warm, soapy water.  Pat dry.  Apply LacHydrin lotion to lower legs daily.  Over the lotion, apply Xeroform gauze to the left lower posterior leg.  Spiral wrap kerlex beginning just above the toes bilaterally, to just below the knees.  Then spiral wrap ACE wraps over the kerlex from just above the toes to just below the  knees.  Change daily.  Consider ordering PT for strengthening and improved mobility. Monitor the wound area(s) for worsening of condition such as: Signs/symptoms of infection,  Increase in size,  Development of or worsening of odor, Development of pain, or increased pain at the affected locations.  Notify the medical team if any of these develop.  Thank you for the consult.  Discussed plan of care with the patient and bedside nurse.  WOC nurse will not follow at this time.  Please re-consult the WOC team if needed.  Helmut Muster, RN, MSN, CWOCN, CNS-BC, pager [phone]

## 2017-11-24 NOTE — Progress Notes (Signed)
D/w Dr.Aljishi, PCCM will manage care while pt is in ICU on pressors -TR[facility] will take over once pt is out of ICU and off pressors  Zannie Cove, MD

## 2017-11-24 NOTE — Progress Notes (Signed)
Pt on 60 mcg of neo. MD verbal order for NS bolus @999  to try and get her weaned off the neo. Will administer and monitor closely.  Delories Heinz, RN

## 2017-11-24 NOTE — Progress Notes (Signed)
PHARMACY - PHYSICIAN COMMUNICATION CRITICAL VALUE ALERT - BLOOD CULTURE IDENTIFICATION (BCID)  Beverly Wheeler is an 72 y.o. female who presented to [facility] on 11/22/2017 with a chief complaint of hypotension.   Assessment: 18 yoF admitted 5/1 with lower extremity swelling with weeping ulcers. Initially started on broad spectrum with antibiotics Zosyn and vancomycin. Now with GPC in 1/4 bottles with negative BCID. PCT 32 > 13.75, WBC 10.5, tmax 99. BP remains soft.   Current antibiotics: Zosyn and vancomycin   Changes to prescribed antibiotics recommended:  Continue antibiotics as is for now. Await results of final blood cultures and make changes to antibiotics as needed.   Results for orders placed or performed during the hospital encounter of 11/22/17  Blood Culture ID Panel (Reflexed) (Collected: 11/22/2017  9:30 PM)  Result Value Ref Range   Enterococcus species NOT DETECTED NOT DETECTED   Listeria monocytogenes NOT DETECTED NOT DETECTED   Staphylococcus species NOT DETECTED NOT DETECTED   Staphylococcus aureus NOT DETECTED NOT DETECTED   Streptococcus species NOT DETECTED NOT DETECTED   Streptococcus agalactiae NOT DETECTED NOT DETECTED   Streptococcus pneumoniae NOT DETECTED NOT DETECTED   Streptococcus pyogenes NOT DETECTED NOT DETECTED   Acinetobacter baumannii NOT DETECTED NOT DETECTED   Enterobacteriaceae species NOT DETECTED NOT DETECTED   Enterobacter cloacae complex NOT DETECTED NOT DETECTED   Escherichia coli NOT DETECTED NOT DETECTED   Klebsiella oxytoca NOT DETECTED NOT DETECTED   Klebsiella pneumoniae NOT DETECTED NOT DETECTED   Proteus species NOT DETECTED NOT DETECTED   Serratia marcescens NOT DETECTED NOT DETECTED   Haemophilus influenzae NOT DETECTED NOT DETECTED   Neisseria meningitidis NOT DETECTED NOT DETECTED   Pseudomonas aeruginosa NOT DETECTED NOT DETECTED   Candida albicans NOT DETECTED NOT DETECTED   Candida glabrata NOT DETECTED NOT DETECTED   Candida  krusei NOT DETECTED NOT DETECTED   Candida parapsilosis NOT DETECTED NOT DETECTED   Candida tropicalis NOT DETECTED NOT DETECTED    Pollyann Samples, PharmD, BCPS 11/24/2017, 7:00 AM

## 2017-11-24 NOTE — Progress Notes (Signed)
  Echocardiogram 2D Echocardiogram has been performed.  Yasuko Lapage G Ming Mcmannis 11/24/2017, 5:19 PM

## 2017-11-24 NOTE — Plan of Care (Signed)
  Problem: Elimination: Goal: Will not experience complications related to bowel motility Outcome: Progressing   Problem: Elimination: Goal: Will not experience complications related to bowel motility Outcome: Progressing   Problem: Pain Managment: Goal: General experience of comfort will improve Outcome: Progressing   Problem: Pain Managment: Goal: General experience of comfort will improve Outcome: Progressing

## 2017-11-25 ENCOUNTER — Inpatient Hospital Stay (HOSPITAL_COMMUNITY): Payer: Medicare Other

## 2017-11-25 DIAGNOSIS — E876 Hypokalemia: Secondary | ICD-10-CM

## 2017-11-25 DIAGNOSIS — E861 Hypovolemia: Secondary | ICD-10-CM

## 2017-11-25 DIAGNOSIS — E875 Hyperkalemia: Secondary | ICD-10-CM

## 2017-11-25 DIAGNOSIS — I9589 Other hypotension: Secondary | ICD-10-CM

## 2017-11-25 LAB — BASIC METABOLIC PANEL
Anion gap: 5 (ref 5–15)
BUN: 51 mg/dL — AB (ref 6–20)
CALCIUM: 8.2 mg/dL — AB (ref 8.9–10.3)
CHLORIDE: 114 mmol/L — AB (ref 101–111)
CO2: 19 mmol/L — ABNORMAL LOW (ref 22–32)
CREATININE: 1.5 mg/dL — AB (ref 0.44–1.00)
GFR, EST AFRICAN AMERICAN: 40 mL/min — AB (ref >60)
GFR, EST NON AFRICAN AMERICAN: 34 mL/min — AB (ref >60)
Glucose, Bld: 103 mg/dL — ABNORMAL HIGH (ref 65–99)
POTASSIUM: 5.3 mmol/L — AB (ref 3.5–5.1)
SODIUM: 138 mmol/L (ref 135–145)

## 2017-11-25 LAB — CBC WITH DIFFERENTIAL/PLATELET
Basophils Absolute: 0.1 10*3/uL (ref 0.0–0.1)
Basophils Relative: 1 %
EOS PCT: 1 %
Eosinophils Absolute: 0.1 10*3/uL (ref 0.0–0.7)
HCT: 31.7 % — ABNORMAL LOW (ref 36.0–46.0)
Hemoglobin: 10.1 g/dL — ABNORMAL LOW (ref 12.0–15.0)
LYMPHS ABS: 1.5 10*3/uL (ref 0.7–4.0)
Lymphocytes Relative: 16 %
MCH: 28.2 pg (ref 26.0–34.0)
MCHC: 31.9 g/dL (ref 30.0–36.0)
MCV: 88.5 fL (ref 78.0–100.0)
MONO ABS: 0.8 10*3/uL (ref 0.1–1.0)
Monocytes Relative: 8 %
NEUTROS ABS: 6.9 10*3/uL (ref 1.7–7.7)
Neutrophils Relative %: 74 %
PLATELETS: 168 10*3/uL (ref 150–400)
RBC: 3.58 MIL/uL — AB (ref 3.87–5.11)
RDW: 17.8 % — AB (ref 11.5–15.5)
WBC: 9.4 10*3/uL (ref 4.0–10.5)

## 2017-11-25 LAB — GLUCOSE, CAPILLARY: GLUCOSE-CAPILLARY: 109 mg/dL — AB (ref 65–99)

## 2017-11-25 LAB — MAGNESIUM: Magnesium: 1.7 mg/dL (ref 1.7–2.4)

## 2017-11-25 LAB — PHOSPHORUS: PHOSPHORUS: 4.4 mg/dL (ref 2.5–4.6)

## 2017-11-25 LAB — PROCALCITONIN: PROCALCITONIN: 3.84 ng/mL

## 2017-11-25 MED ORDER — VANCOMYCIN HCL 10 G IV SOLR
1250.0000 mg | INTRAVENOUS | Status: DC
Start: 2017-11-25 — End: 2017-11-28
  Administered 2017-11-25 – 2017-11-27 (×3): 1250 mg via INTRAVENOUS
  Filled 2017-11-25 (×5): qty 1250

## 2017-11-25 NOTE — Progress Notes (Signed)
PULMONARY / CRITICAL CARE MEDICINE   Name: Beverly Wheeler MRN: [id number] DOB: 11-28-1945    ADMISSION DATE:  11/22/2017 CONSULTATION DATE:  5/2  REFERRING MD:  Triad   Interval Hx: Feeling better. Complains of being thirsty. Still on neo drip at 40.    CHIEF COMPLAINT:  Hypotension   HISTORY OF PRESENT ILLNESS:   72 year old female former smoker (quit 4 years ago) with history of CAD with previous MI, COPD, hypertension who presented 5/1 with 1 week history of weakness, nausea vomiting, right greater than left lower extremity swelling with weeping ulcers.  In the emergency room she was noted to be in acute renal failure with creatinine of 5, and hyperkalemic with a potassium of 6.7.  She was initially admitted by the hospitalist with acute renal failure, lower extremity cellulitis.  She remained in the ER overnight where she has had persistent hypotension with systolic blood pressure in the 60s.  PCCM consulted for potential ICU admission.   She currently has no complaints.  She is awake and alert in the ER, sitting up eating lunch in bed. She denies chest pain, shortness of breath, lightheadedness, syncope She does complain of BLE edema, redness, pain. She still feels weak but does feel a bit better.   She has had approximately 4 L of IV fluids given in ER.  She is making urine.  Serum creatinine is much improved 5.08->3.87 and potassium is improved after IV insulin, D50 from 6.7->5.9.      SUBJECTIVE:  Awake alert no acute distress  VITAL SIGNS: BP (!) 71/62   Pulse 83   Temp 98.7 F (37.1 C) (Oral)   Resp 15   Ht 5\' 1"  (1.549 m)   Wt 84.6 kg (186 lb 8.2 oz)   SpO2 95%   BMI 35.24 kg/m   HEMODYNAMICS:    VENTILATOR SETTINGS:    INTAKE / OUTPUT: I/O last 3 completed shifts: In: 6500.5 [P.O.:290; I.V.:5660.5; Other:250; IV Piggyback:300] Out: 1380 [Urine:1380]  PHYSICAL EXAMINATION: General: 72 year old female appears older than stated age with her course raspy  voice. HEENT: No JVD or lymphadenopathy is appreciated very dry mucus membranes  Neuro: Appears intact follows commands CV: Sounds are regular, sinus rhythm   PULM: even/non-labored, lungs bilaterally managed ME:QAST, non-tender, bsx4 active  Extremities: Lower extremities with cellulitic changes and wraps in place Skin: no rashes or lesions    LABS:  BMET Recent Labs  Lab 11/23/17 1834 11/24/17 0438 11/25/17 0238  NA 132* 135 138  K 5.9* 5.2* 5.3*  CL 105 108 114*  CO2 18* 19* 19*  BUN 82* 74* 51*  CREATININE 3.25* 2.71* 1.50*  GLUCOSE 99 105* 103*    Electrolytes Recent Labs  Lab 11/23/17 1834 11/24/17 0438 11/25/17 0238  CALCIUM 7.9* 8.2* 8.2*  MG  --   --  1.7  PHOS  --   --  4.4    CBC Recent Labs  Lab 11/22/17 1937 11/24/17 0438 11/25/17 0238  WBC 10.2 10.2 9.4  HGB 10.8* 10.3* 10.1*  HCT 33.4* 32.5* 31.7*  PLT 259 240 168    Coag's No results for input(s): APTT, INR in the last 168 hours.  Sepsis Markers Recent Labs  Lab 11/22/17 1946 11/23/17 1145 11/24/17 0438 11/25/17 0238  LATICACIDVEN 0.86 0.9  --   --   PROCALCITON  --  32.30 13.75 3.84    ABG No results for input(s): PHART, PCO2ART, PO2ART in the last 168 hours.  Liver Enzymes Recent Labs  Lab 11/22/17  1937  AST 14*  ALT 9*  ALKPHOS 59  BILITOT 0.4  ALBUMIN 2.9*    Cardiac Enzymes No results for input(s): TROPONINI, PROBNP in the last 168 hours.  Glucose Recent Labs  Lab 11/23/17 2153  GLUCAP 94    Imaging Dg Chest Port 1 View  Result Date: 11/25/2017 CLINICAL DATA:  72 year old female with history of respiratory failure. EXAM: PORTABLE CHEST 1 VIEW COMPARISON:  Chest x-ray 11/22/2017. FINDINGS: Lung volumes are low. Bibasilar opacities may reflect areas of atelectasis and/or consolidation. Small bilateral pleural effusions. Pulmonary vasculature does not appear engorged. Heart size appears upper limits of normal, likely accentuated by AP technique. The patient  is rotated to the right on today's exam, resulting in distortion of the mediastinal contours and reduced diagnostic sensitivity and specificity for mediastinal pathology. Aortic atherosclerosis. IMPRESSION: 1. Low lung volumes with bibasilar areas of atelectasis and/or airspace consolidation and superimposed small bilateral pleural effusions. 2. Aortic atherosclerosis. Electronically Signed   By: Trudie Reed M.D.   On: 11/25/2017 08:10     STUDIES:  11/25/1998 nineteen 2D echo>>  CULTURES: Urine 5/1 >> multiple species recollected>> BC X2 5/1 >>  ANTIBIOTICS: Vancomycin 5/1 >> Zosyn 5/2 >>  SIGNIFICANT EVENTS:   LINES/TUBES:   DISCUSSION: 72 year old with history of coronary artery disease status post MI 2015 is admitted 5 1 with acute renal failure hypotension.  She is currently up +3.2 L but still appears intravascularly dry.  Continue fluid resuscitation no improvement in renal function and potassium.  Note she has not had a 2D echo since 2015.  ASSESSMENT / PLAN:  Intake/Output Summary (Last 24 hours) at 11/25/2017 0857 Last data filed at 11/25/2017 0700 Gross per 24 hour  Intake 4719 ml  Output 1000 ml  Net 3719 ml   Acute renal failure Hyperkalemia Lab Results  Component Value Date   CREATININE 1.50 (H) 11/25/2017   CREATININE 2.71 (H) 11/24/2017   CREATININE 3.25 (H) 11/23/2017   CREATININE 0.94 02/20/2014   CREATININE 0.77 10/29/2013   Recent Labs  Lab 11/23/17 1834 11/24/17 0438 11/25/17 0238  K 5.9* 5.2* 5.3*    Plan- Continue volume resuscitation Note potassium is slowly improving   Hypotension Septic shock   Most likely secondary to sepsis and hypovolemia   Plan- Currently on Neo-Synephrine but wean as tolerated Continue fluid resuscitation  Hold antihypertensives Echo showed EF 35% -40%     BLE?  Cellulitis -suspect underlying peripheral vascular disease. Plan- Continue vancomycin and Zosyn  Follow culture data Wound care Suspect  she needs peripheral vascular disease work-up in the near future   FAMILY  - Updates: none available

## 2017-11-25 NOTE — Evaluation (Signed)
Physical Therapy Evaluation Patient Details Name: Beverly Wheeler MRN: [id number] DOB: January 15, 1946 Today's Date: 11/25/2017   History of Present Illness  72 year old female former smoker (quit 4 years ago) with history of CAD with previous MI, COPD, hypertension who presented 5/1 with 1 week history of weakness, nausea vomiting, right greater than left lower extremity swelling with weeping ulcers.  In the emergency room she was noted to be in acute renal failure with creatinine of 5, and hyperkalemic with a potassium of 6.7.  She was initially admitted by the hospitalist with acute renal failure, lower extremity cellulitis.  She remained in the ER overnight where she has had persistent hypotension with systolic blood pressure in the 60s.  PCCM consulted for potential ICU admission.    Clinical Impression  Pt admitted with above diagnosis. Pt currently with functional limitations due to the deficits listed below (see PT Problem List). PTA, pt reporting independence with limited household ambulation and ADLs, and has not walked in the community for at least a year. Patient lives with son who is not present to confirm history, however pts presentation would suggest that she has not been taking care of herself with skin breakdown and neglected hygiene. Upon eval pt presents with generalized weakness and activity intolerance, as well as BLE pain. Currently Min A x 2 for bed mobility and standing transfer, unable to progress to ambulation this visit (see vitals below).  Pt will benefit from skilled PT to increase their independence and safety with mobility to allow discharge to the venue listed below.    Vitals this session:  Start of session (semi-fowler at rest): BP 120/58 MAP 72    SpO2 98% on 2L   HR 84  Standing at EOB:  BP 151/125 MAP 135     SpO2 87% on 2L      HR 105 then 46  End of Session (semi-fowler 5 minutes after activity): BP 136/68 MAP 89  SpO2 95% on 2L   HR 91     Follow Up  Recommendations SNF;Supervision/Assistance - 24 hour    Equipment Recommendations  (TBD next venue )    Recommendations for Other Services OT consult     Precautions / Restrictions Precautions Precautions: Fall Restrictions Weight Bearing Restrictions: No      Mobility  Bed Mobility Overal bed mobility: Needs Assistance Bed Mobility: Supine to Sit;Sit to Supine;Rolling Rolling: Min assist   Supine to sit: Min assist Sit to supine: Mod assist   General bed mobility comments: Min A to assist patients painful LE's over EOB, hand held assist trunk up from eleated [facility].  Transfers Overall transfer level: Needs assistance Equipment used: 2 person hand held assist Transfers: Sit to/from Stand Sit to Stand: +2 physical assistance;Min assist         General transfer comment: Min A x2 to help patient stand up and provide standing stability. Patient tolerated standing for 10 seconds before requiring seated break.   Ambulation/Gait             General Gait Details: unable to this visit.  Stairs            Wheelchair Mobility    Modified Rankin (Stroke Patients Only)       Balance Overall balance assessment: Needs assistance   Sitting balance-Leahy Scale: Fair       Standing balance-Leahy Scale: Poor  Pertinent Vitals/Pain Pain Assessment: Faces Faces Pain Scale: Hurts whole lot Pain Location: BLE Pain Descriptors / Indicators: Moaning;Discomfort;Grimacing;Guarding Pain Intervention(s): Limited activity within patient's tolerance;Monitored during session;Premedicated before session;Repositioned    Home Living Family/patient expects to be discharged to:: Private residence Living Arrangements: Other relatives;Children Available Help at Discharge: Available PRN/intermittently;Family(Son- not present to confirm availbility ) Type of Home: House Home Access: Level entry     Home Layout: One level Home  Equipment: Walker - 2 wheels      Prior Function Level of Independence: Independent with assistive device(s)         Comments: Patient reports limited household ambulation without AD but sometimes using RW. says she has not left the house much since her sister arrived "1-2 years ago". she reports indepndence with ADLs and taking care of her sister with alzheimer's     Hand Dominance        Extremity/Trunk Assessment   Upper Extremity Assessment Upper Extremity Assessment: Defer to OT evaluation;Overall [facility] for tasks assessed    Lower Extremity Assessment Lower Extremity Assessment: Generalized weakness(BLE strength gross 3/5)       Communication   Communication: No difficulties  Cognition Arousal/Alertness: Awake/alert Behavior During Therapy: WFL for tasks assessed/performed Overall Cognitive Status: Within Functional Limits for tasks assessed                                        General Comments      Exercises General Exercises - Lower Extremity Long Arc Quad: 10 reps;Both Hip Flexion/Marching: Both;10 reps   Assessment/Plan    PT Assessment Patient needs continued PT services  PT Problem List Decreased strength;Decreased range of motion;Decreased activity tolerance;Decreased balance;Decreased mobility;Decreased coordination;Pain;Decreased safety awareness;Decreased skin integrity       PT Treatment Interventions DME instruction;Gait training;Stair training;Functional mobility training;Therapeutic activities;Therapeutic exercise;Balance training;Patient/family education    PT Goals (Current goals can be found in the Care Plan section)  Acute Rehab PT Goals Patient Stated Goal: Open to rehab in the future PT Goal Formulation: With patient Time For Goal Achievement: 12/09/17 Potential to Achieve Goals: Good    Frequency Min 2X/week   Barriers to discharge        Co-evaluation               AM-PAC PT "6 Clicks" Daily Activity   Outcome Measure Difficulty turning over in bed (including adjusting bedclothes, sheets and blankets)?: Unable Difficulty moving from lying on back to sitting on the side of the bed? : Unable Difficulty sitting down on and standing up from a chair with arms (e.g., wheelchair, bedside commode, etc,.)?: Unable Help needed moving to and from a bed to chair (including a wheelchair)?: A Lot Help needed walking in hospital room?: Total Help needed climbing 3-5 steps with a railing? : Total 6 Click Score: 7    End of Session Equipment Utilized During Treatment: Gait belt;Oxygen       PT Visit Diagnosis: Pain;History of falling (Z91.81);Muscle weakness (generalized) (M62.81);Unsteadiness on feet (R26.81) Pain - part of body: Leg(BLE)    Time: 0950-1030 PT Time Calculation (min) (ACUTE ONLY): 40 min   Charges:   PT Evaluation $PT Eval Moderate Complexity: 1 Mod PT Treatments $Therapeutic Activity: 8-22 mins   PT G Codes:        Etta Grandchild, PT, DPT Acute Rehab Services Pager: [phone]    Etta Grandchild 11/25/2017, 10:57 AM

## 2017-11-25 NOTE — Progress Notes (Signed)
Patients first cousin, Vivien Rossetti, called this shift at 1654. Spoke to [place]. She was very concerned about patient's inability to take care of her son and her sister. Told Lurena Joiner that since she did not have a password, I cannot give her any information regarding patient's care. Lurena Joiner mentioned that she would be calling social services on the patient  because she has been trying to convince the patient to send Lynden Ang (patient's sister), to a facility because of her alzheimer. Lurena Joiner says that they (patinet and patient's son) need to lock Lynden Ang in and have chairs block the door, so that she would not wander out. Lurena Joiner also mentions that patient's son is considered special needs and with the patient's health deteriorating, the patient would not be able to care for them. Lurena Joiner said she has been trying to get the patient to understand that she can no longer take care of them. Lurena Joiner said she wanted [place] (the hospital) to intervene and was asking for advice on what to do. I told her that I could not tell her what she needs to do, and that as for intervening, there wasn't much I could do either besides informing my charge Nurse about the conversation, which I did. I then placed a consult for social work and care management.

## 2017-11-25 NOTE — Progress Notes (Addendum)
Pharmacy Antibiotic Note  Beverly Wheeler is a 72 y.o. female admitted on 11/22/2017 with cellulitis.  Pharmacy consulted for vancomycin dosing.  Zosyn stopped.  BCx with  GPC, culture now with CNS.  Likely contaminant  WBC WNL, no temp documented. Scr now improved to 1.5  Plan: Increase vancomycin to 1250 mg q 24 hrs.  F/u C&S, clinical status, de-escalation, LOT Given culture results, consider de-escalation soon?  Height: 5\' 1"  (154.9 cm) Weight: 186 lb 8.2 oz (84.6 kg) IBW/kg (Calculated) : 47.8  Temp (24hrs), Avg:98.6 F (37 C), Min:98.4 F (36.9 C), Max:98.8 F (37.1 C)  Recent Labs  Lab 11/22/17 1937 11/22/17 1946 11/23/17 0554 11/23/17 1145 11/23/17 1834 11/24/17 0438 11/25/17 0238  WBC 10.2  --   --   --   --  10.2 9.4  CREATININE 5.08*  --  3.87*  --  3.25* 2.71* 1.50*  LATICACIDVEN  --  0.86  --  0.9  --   --   --     Estimated Creatinine Clearance: 34.9 mL/min (A) (by C-G formula based on SCr of 1.5 mg/dL (H)).    No Known Allergies  Antimicrobials this admission: Vanc 5/1 >>  Cefazolin 5/1>>5/2 Zosyn 5/2>>  Dose adjustments this admission: None  Microbiology results: 5/1 Urine:multiple 5/1 BCx: GPC 1/2;  culture growing coag-negative staph  Thank you for allowing pharmacy to be a part of this patient's care.  Tad Moore, BCPS  Clinical Pharmacist Pager [phone]  11/25/2017 12:17 PM

## 2017-11-26 ENCOUNTER — Inpatient Hospital Stay (HOSPITAL_COMMUNITY): Payer: Medicare Other

## 2017-11-26 DIAGNOSIS — J9601 Acute respiratory failure with hypoxia: Secondary | ICD-10-CM

## 2017-11-26 LAB — CBC WITH DIFFERENTIAL/PLATELET
BASOS PCT: 1 %
Basophils Absolute: 0.1 10*3/uL (ref 0.0–0.1)
Eosinophils Absolute: 0.1 10*3/uL (ref 0.0–0.7)
Eosinophils Relative: 0 %
HEMATOCRIT: 33.5 % — AB (ref 36.0–46.0)
HEMOGLOBIN: 10.4 g/dL — AB (ref 12.0–15.0)
LYMPHS PCT: 8 %
Lymphs Abs: 1 10*3/uL (ref 0.7–4.0)
MCH: 28.3 pg (ref 26.0–34.0)
MCHC: 31 g/dL (ref 30.0–36.0)
MCV: 91.3 fL (ref 78.0–100.0)
MONOS PCT: 6 %
Monocytes Absolute: 0.8 10*3/uL (ref 0.1–1.0)
NEUTROS ABS: 10.9 10*3/uL — AB (ref 1.7–7.7)
Neutrophils Relative %: 85 %
Platelets: 248 10*3/uL (ref 150–400)
RBC: 3.67 MIL/uL — ABNORMAL LOW (ref 3.87–5.11)
RDW: 18 % — ABNORMAL HIGH (ref 11.5–15.5)
WBC: 12.8 10*3/uL — ABNORMAL HIGH (ref 4.0–10.5)

## 2017-11-26 LAB — BASIC METABOLIC PANEL
ANION GAP: 6 (ref 5–15)
ANION GAP: 4 — AB (ref 5–15)
BUN: 33 mg/dL — ABNORMAL HIGH (ref 6–20)
BUN: 30 mg/dL — ABNORMAL HIGH (ref 6–20)
CHLORIDE: 114 mmol/L — AB (ref 101–111)
CHLORIDE: 114 mmol/L — AB (ref 101–111)
CO2: 18 mmol/L — AB (ref 22–32)
CO2: 24 mmol/L (ref 22–32)
CREATININE: 1.14 mg/dL — AB (ref 0.44–1.00)
Calcium: 8.5 mg/dL — ABNORMAL LOW (ref 8.9–10.3)
Calcium: 8.8 mg/dL — ABNORMAL LOW (ref 8.9–10.3)
Creatinine, Ser: 1.14 mg/dL — ABNORMAL HIGH (ref 0.44–1.00)
GFR calc non Af Amer: 48 mL/min — ABNORMAL LOW (ref >60)
GFR calc non Af Amer: 48 mL/min — ABNORMAL LOW (ref >60)
GFR, EST AFRICAN AMERICAN: 56 mL/min — AB (ref >60)
GFR, EST AFRICAN AMERICAN: 56 mL/min — AB (ref >60)
Glucose, Bld: 181 mg/dL — ABNORMAL HIGH (ref 65–99)
Glucose, Bld: 107 mg/dL — ABNORMAL HIGH (ref 65–99)
Potassium: 5.6 mmol/L — ABNORMAL HIGH (ref 3.5–5.1)
Potassium: 5.2 mmol/L — ABNORMAL HIGH (ref 3.5–5.1)
Sodium: 138 mmol/L (ref 135–145)
Sodium: 142 mmol/L (ref 135–145)

## 2017-11-26 LAB — CULTURE, BLOOD (ROUTINE X 2): Special Requests: ADEQUATE

## 2017-11-26 MED ORDER — FUROSEMIDE 10 MG/ML IJ SOLN
40.0000 mg | Freq: Two times a day (BID) | INTRAMUSCULAR | Status: DC
  Administered 2017-11-26 – 2017-11-27 (×3): 40 mg via INTRAVENOUS
  Filled 2017-11-26 (×3): qty 4

## 2017-11-26 MED ORDER — MIDODRINE HCL 5 MG PO TABS
5.0000 mg | ORAL_TABLET | Freq: Three times a day (TID) | ORAL | Status: DC
  Administered 2017-11-26 – 2017-11-28 (×6): 5 mg via ORAL
  Filled 2017-11-26 (×7): qty 1

## 2017-11-26 MED ORDER — INSULIN ASPART 100 UNIT/ML IV SOLN
10.0000 [IU] | Freq: Once | INTRAVENOUS | Status: AC
  Administered 2017-11-26: 10 [IU] via INTRAVENOUS

## 2017-11-26 MED ORDER — PIPERACILLIN-TAZOBACTAM 3.375 G IVPB
3.3750 g | Freq: Three times a day (TID) | INTRAVENOUS | Status: DC
  Administered 2017-11-26 – 2017-11-28 (×6): 3.375 g via INTRAVENOUS
  Filled 2017-11-26 (×7): qty 50

## 2017-11-26 MED ORDER — DEXTROSE 50 % IV SOLN
1.0000 | Freq: Once | INTRAVENOUS | Status: AC
  Administered 2017-11-26: 50 mL via INTRAVENOUS
  Filled 2017-11-26: qty 50

## 2017-11-26 NOTE — Progress Notes (Signed)
   11/26/17 1000  Clinical Encounter Type  Visited With Patient;Health care provider  Visit Type Initial;Spiritual support  Referral From Nurse  Consult/Referral To Chaplain  Spiritual Encounters  Spiritual Needs Emotional  Stress Factors  Patient Stress Factors  (caring for sister )   Responded to a SCC for Major Life Transition.  Patient was resting in the bed and healthcare provider was preparing to change the dressing.  Patient stated that she does care for her sister (who lives with she and her son) patient stated she has experienced a lot of loss over the last few years and has cared for many of her family members.  It appears she realizes she can't care for her sister any more at home.  She stated she does have some options she is looking into.  The patient needed to use the restroom, so I will follow up and support later today. Chaplain Agustin Cree

## 2017-11-26 NOTE — Progress Notes (Signed)
PULMONARY / CRITICAL CARE MEDICINE   Name: Beverly Wheeler MRN: [id number] DOB: [birth year]    ADMISSION DATE:  11/22/2017 CONSULTATION DATE:  5/2  REFERRING MD:  Triad   Interval Hx: Patient states that she had a rough night could not sleep. Asking if she can get out of bed. denies shortness of breath. Still on neo at 30    CHIEF COMPLAINT:  Hypotension   HISTORY OF PRESENT ILLNESS:   72 year old female former smoker (quit 4 years ago) with history of CAD with previous MI, COPD, hypertension who presented 5/1 with 1 week history of weakness, nausea vomiting, right greater than left lower extremity swelling with weeping ulcers.  In the emergency room she was noted to be in acute renal failure with creatinine of 5, and hyperkalemic with a potassium of 6.7.  She was initially admitted by the hospitalist with acute renal failure, lower extremity cellulitis.  She remained in the ER overnight where she has had persistent hypotension with systolic blood pressure in the 60s.  PCCM consulted for potential ICU admission.   She currently has no complaints.  She is awake and alert in the ER, sitting up eating lunch in bed. She denies chest pain, shortness of breath, lightheadedness, syncope She does complain of BLE edema, redness, pain. She still feels weak but does feel a bit better.   She has had approximately 4 L of IV fluids given in ER.  She is making urine.  Serum creatinine is much improved 5.08->3.87 and potassium is improved after IV insulin, D50 from 6.7->5.9.      SUBJECTIVE:  Awake alert no acute distress  VITAL SIGNS: BP (!) 93/52   Pulse 72   Temp 97.9 F (36.6 C) (Oral)   Resp 20   Ht 5\' 1"  (1.549 m)   Wt 84.6 kg (186 lb 8.2 oz)   SpO2 98%   BMI 35.24 kg/m   HEMODYNAMICS:    VENTILATOR SETTINGS:    INTAKE / OUTPUT: I/O last 3 completed shifts: In: 5241.5 [I.V.:5041.5; IV Piggyback:200] Out: 1350 [Urine:1350]  PHYSICAL EXAMINATION: General: 73 year old female  appears older than stated age with her course raspy voice. HEENT: No JVD or lymphadenopathy is appreciated very dry mucus membranes  Neuro: Appears intact follows commands CV: Sounds are regular, sinus rhythm   PULM: decreased air sounds bilaterally  WG:NFAO, non-tender, bsx4 active  Extremities: Lower extremities with cellulitic changes and wraps in place Skin: no rashes or lesions    LABS:  BMET Recent Labs  Lab 11/24/17 0438 11/25/17 0238 11/26/17 0208  NA 135 138 138  K 5.2* 5.3* 5.6*  CL 108 114* 114*  CO2 19* 19* 18*  BUN 74* 51* 33*  CREATININE 2.71* 1.50* 1.14*  GLUCOSE 105* 103* 181*    Electrolytes Recent Labs  Lab 11/24/17 0438 11/25/17 0238 11/26/17 0208  CALCIUM 8.2* 8.2* 8.5*  MG  --  1.7  --   PHOS  --  4.4  --     CBC Recent Labs  Lab 11/24/17 0438 11/25/17 0238 11/26/17 0208  WBC 10.2 9.4 12.8*  HGB 10.3* 10.1* 10.4*  HCT 32.5* 31.7* 33.5*  PLT 240 168 248    Coag's No results for input(s): APTT, INR in the last 168 hours.  Sepsis Markers Recent Labs  Lab 11/22/17 1946 11/23/17 1145 11/24/17 0438 11/25/17 0238  LATICACIDVEN 0.86 0.9  --   --   PROCALCITON  --  32.30 13.75 3.84    ABG No results for input(s): PHART,  PCO2ART, PO2ART in the last 168 hours.  Liver Enzymes Recent Labs  Lab 11/22/17 1937  AST 14*  ALT 9*  ALKPHOS 59  BILITOT 0.4  ALBUMIN 2.9*    Cardiac Enzymes No results for input(s): TROPONINI, PROBNP in the last 168 hours.  Glucose Recent Labs  Lab 11/23/17 2153 11/25/17 2118  GLUCAP 94 109*    Imaging Dg Chest Port 1 View  Result Date: 11/26/2017 CLINICAL DATA:  72 year old female with history of respiratory failure. EXAM: PORTABLE CHEST 1 VIEW COMPARISON:  Chest x-ray 11/25/2017. FINDINGS: Bibasilar opacities appears slightly worsened and may reflect areas of atelectasis and/or airspace consolidation. Small to moderate bilateral pleural effusions. There is cephalization of the pulmonary  vasculature and slight indistinctness of the interstitial markings suggestive of mild pulmonary edema. Mild cardiomegaly. Aortic atherosclerosis. IMPRESSION: 1. Overall, the appearance the chest suggests worsening congestive heart failure, as detailed above. 2. Aortic atherosclerosis. Electronically Signed   By: Trudie Reed M.D.   On: 11/26/2017 07:46     STUDIES:  11/25/1998 nineteen 2D echo>>  CULTURES: Urine 5/1 >> multiple species recollected>> BC X2 5/1 >>  ANTIBIOTICS: Vancomycin 5/1 >> Zosyn 5/2 >>  SIGNIFICANT EVENTS:   LINES/TUBES:   DISCUSSION: 72 year old with history of coronary artery disease status post MI 2015 is admitted 5 1 with acute renal failure hypotension.  She is currently up +3.2 L but still appears intravascularly dry.  Continue fluid resuscitation no improvement in renal function and potassium.  Note she has not had a 2D echo since 2015.  ASSESSMENT / PLAN:  Intake/Output Summary (Last 24 hours) at 11/26/2017 1020 Last data filed at 11/26/2017 1000 Gross per 24 hour  Intake 2632.17 ml  Output 1000 ml  Net 1632.17 ml   Acute renal failure Hyperkalemia Lab Results  Component Value Date   CREATININE 1.14 (H) 11/26/2017   CREATININE 1.50 (H) 11/25/2017   CREATININE 2.71 (H) 11/24/2017   CREATININE 0.94 02/20/2014   CREATININE 0.77 10/29/2013   Recent Labs  Lab 11/24/17 0438 11/25/17 0238 11/26/17 0208  K 5.2* 5.3* 5.6*    Plan- Lasix stat  - d50 and insulin  - repeat BMP this afternoon     Hypotension Septic shock   Most likely secondary to sepsis and hypovolemia   Plan- Currently on Neo-Synephrine but wean as tolerated Start midodrine  Hold antihypertensives Echo showed EF 35% -40%     Acute hypoxemic resp failure Acute pulm edema - IV lasix - diurese and keep negative which will be a challenge with her pressors    BLE?  Cellulitis -suspect underlying peripheral vascular disease. Plan- Continue vancomycin and Zosyn   Follow culture data Wound care Suspect she needs peripheral vascular disease work-up in the near future   FAMILY  - Updates: none available

## 2017-11-26 NOTE — Progress Notes (Signed)
Pharmacy Antibiotic Note  Beverly Wheeler is a 72 y.o. female admitted on 11/22/2017 with cellulitis.  Pharmacy consulted for vancomycin dosing, asked to resume Zosyn today.  BCx with  GPC, culture now with CNS.  Likely contaminant.  Spoke with CCM, continue both vancomycin and zosyn for now given pressor requirement.  WBC WNL, no temp documented. Scr now improved to 1.15  Plan: Continue vancomycin to 1250 mg q 24 hrs. May need to increase soon with improving renal function. Resume Zosyn 3.375g IV q 8 hrs. F/u C&S, clinical status, de-escalation, LOT Given culture results, consider de-escalation soon?  Height: 5\' 1"  (154.9 cm) Weight: 186 lb 8.2 oz (84.6 kg) IBW/kg (Calculated) : 47.8  Temp (24hrs), Avg:98.5 F (36.9 C), Min:97.9 F (36.6 C), Max:99 F (37.2 C)  Recent Labs  Lab 11/22/17 1937 11/22/17 1946 11/23/17 0554 11/23/17 1145 11/23/17 1834 11/24/17 0438 11/25/17 0238 11/26/17 0208  WBC 10.2  --   --   --   --  10.2 9.4 12.8*  CREATININE 5.08*  --  3.87*  --  3.25* 2.71* 1.50* 1.14*  LATICACIDVEN  --  0.86  --  0.9  --   --   --   --     Estimated Creatinine Clearance: 46 mL/min (A) (by C-G formula based on SCr of 1.14 mg/dL (H)).    No Known Allergies  Antimicrobials this admission: Vanc 5/1 >>  Cefazolin 5/1>>5/2 Zosyn 5/2>>5/3; Resume 5/5  Dose adjustments this admission: 5/4 Vancomycin dose increase d/t improved Scr  Microbiology results: 5/1 Urine:multiple 5/1 BCx: GPC 1/2;  culture growing coag-negative staph  Thank you for allowing pharmacy to be a part of this patient's care.  Tad Moore, BCPS  Clinical Pharmacist Pager [phone]  11/26/2017 11:04 AM

## 2017-11-26 NOTE — [place] FL2 (Addendum)
[place] MEDICAID FL2 LEVEL OF CARE SCREENING TOOL     IDENTIFICATION  Patient Name: Beverly Wheeler Birthdate: 06/06/1946 Sex: female Admission Date (Current Location): 11/22/2017  [facility] and [place] Number:  Producer, television/film/video and Address:  [facility] [place], [address] [place], [place] [address]      Provider Number: [id number]  Attending Physician Name and Address:  Reyes Ivan, MD  Relative Name and Phone Number:       Current Level of Care: Hospital Recommended Level of Care: Skilled Nursing Facility Prior Approval Number:    Date Approved/Denied:   PASRR Number: [id number]  Discharge Plan: SNF    Current Diagnoses: Patient Active Problem List   Diagnosis Date Noted  . Acute respiratory failure with hypoxia (HCC)   . Hyperkalemia   . Pressure injury of skin 11/24/2017  . Acute kidney injury (HCC)   . Cellulitis of left lower extremity   . Sepsis (HCC)   . Septic shock (HCC)   . Hypovolemic shock (HCC)   . Hypotension 11/23/2017  . Acute renal failure (ARF) (HCC) 11/22/2017  . CAD, multiple vessel 10/30/2013  . Anxiety 10/30/2013  . Pre-op evaluation 10/17/2013  . Myocardial infarction (HCC) 10/16/2013  . Acute MI (HCC) 10/16/2013  . OVERWEIGHT 12/10/2009  . CIGARETTE SMOKER, has stopped smoking 12/10/2009  . Essential hypertension 12/10/2009  . DEGENERATIVE JOINT DISEASE 12/10/2009  . HOARSENESS, CHRONIC 12/10/2009  . CHEST PAIN UNSPECIFIED 12/10/2009  . Hyperlipidemia, mixed 01/24/2008  . ANXIETY 01/24/2008  . ALLERGIC RHINITIS 01/24/2008  . COPD  GOLD II based on fev1/VC of 58%  01/24/2008  . GERD 01/24/2008  . POSITIVE PPD 01/24/2008    Orientation RESPIRATION BLADDER Height & Weight     Self, Time, Situation, Place  O2(Nasal Cannula 1L) External catheter, Incontinent(Placed 5/3) Weight: 186 lb 8.2 oz (84.6 kg) Height:  5\' 1"  (154.9 cm)  BEHAVIORAL SYMPTOMS/MOOD NEUROLOGICAL BOWEL NUTRITION STATUS   Incontinent Diet(regular diet, thin liquids)  AMBULATORY STATUS COMMUNICATION OF NEEDS Skin   Extensive Assist Verbally Other (Comment)(Open wound, lower leg, Impregnated gauze (bismuth);Moisture barrier;Gauze (Comment);Compression wrap)   PU Stage 2 Dressing: Daily(Sacrum, foam dressing)                   Personal Care Assistance Level of Assistance  Dressing, Bathing, Feeding Bathing Assistance: Maximum assistance Feeding assistance: Independent Dressing Assistance: Maximum assistance     Functional Limitations Info  Sight, Hearing, Speech Sight Info: Adequate Hearing Info: Adequate Speech Info: Adequate    SPECIAL CARE FACTORS FREQUENCY  PT (By licensed PT), OT (By licensed OT)     PT Frequency: 5/wk OT Frequency: 5/wk            Contractures Contractures Info: Not present    Additional Factors Info  Code Status, Allergies Code Status Info: Full Code Allergies Info: No known allergies           Current Medications (11/26/2017):  This is the current hospital active medication list Current Facility-Administered Medications  Medication Dose Route Frequency Provider Last Rate Last Dose  . 0.9 %  sodium chloride infusion   Intravenous Continuous Zannie Cove, MD 100 mL/hr at 11/26/17 [phone]    . acetaminophen (TYLENOL) tablet 1,000 mg  1,000 mg Oral Q6H PRN Carron Curie, MD   1,000 mg at 11/26/17 0927  . alum & mag hydroxide-simeth (MAALOX/MYLANTA) 200-200-20 MG/5ML suspension 30 mL  30 mL Oral Q4H PRN Zannie Cove, MD   30 mL at  11/25/17 2310  . ammonium lactate (LAC-HYDRIN) 12 % lotion   Topical q morning - 10a Zannie Cove, MD      . aspirin chewable tablet 81 mg  81 mg Oral Daily Carron Curie, MD   81 mg at 11/26/17 5400  . atorvastatin (LIPITOR) tablet 80 mg  80 mg Oral q1800 Carron Curie, MD   80 mg at 11/25/17 1811  . famotidine (PEPCID) tablet 10 mg  10 mg Oral Daily Carron Curie, MD   10 mg at 11/26/17 8676  . fenofibrate tablet 54 mg  54 mg Oral Daily  Carron Curie, MD   54 mg at 11/26/17 1950  . furosemide (LASIX) injection 40 mg  40 mg Intravenous BID Reyes Ivan, MD   40 mg at 11/26/17 1037  . heparin injection 5,000 Units  5,000 Units Subcutaneous Q8H Carron Curie, MD   5,000 Units at 11/26/17 1619  . HYDROcodone-acetaminophen (NORCO/VICODIN) 5-325 MG per tablet 1-2 tablet  1-2 tablet Oral Q4H PRN Carron Curie, MD   1 tablet at 11/25/17 2310  . liver oil-zinc oxide (DESITIN) 40 % ointment   Topical q morning - 10a Zannie Cove, MD      . loratadine (CLARITIN) tablet 10 mg  10 mg Oral Daily Carron Curie, MD   10 mg at 11/26/17 9326  . MEDLINE mouth rinse  15 mL Mouth Rinse BID Zannie Cove, MD   15 mL at 11/26/17 0931  . midodrine (PROAMATINE) tablet 5 mg  5 mg Oral TID WC Reyes Ivan, MD   5 mg at 11/26/17 1619  . multivitamin with minerals tablet 1 tablet  1 tablet Oral Daily Carron Curie, MD   1 tablet at 11/26/17 7124  . ondansetron (ZOFRAN) tablet 4 mg  4 mg Oral Q6H PRN Carron Curie, MD       Or  . ondansetron (ZOFRAN) injection 4 mg  4 mg Intravenous Q6H PRN Carron Curie, MD      . phenylephrine (NEO-SYNEPHRINE) 10 mg in sodium chloride 0.9 % 250 mL (0.04 mg/mL) infusion  0-400 mcg/min Intravenous Titrated Hammonds, Curt Jews, MD   Stopped at 11/26/17 1145  . piperacillin-tazobactam (ZOSYN) IVPB 3.375 g  3.375 g Intravenous Q8H Carney, Gwenlyn Found, RPH   Stopped at 11/26/17 1545  . pneumococcal 23 valent vaccine (PNU-IMMUNE) injection 0.5 mL  0.5 mL Intramuscular Tomorrow-1000 Zannie Cove, MD      . triamcinolone (NASACORT) nasal inhaler 2 spray  2 spray Nasal Daily Carron Curie, MD   2 spray at 11/26/17 [phone]  . vancomycin (VANCOCIN) 1,250 mg in sodium chloride 0.9 % 250 mL IVPB  1,250 mg Intravenous Q24H Gardner Candle, [facility]   Stopped at 11/25/17 1945     Discharge Medications: Please see discharge summary for a list of discharge medications.  Relevant Imaging Results:  Relevant Lab Results:   Additional  Information SSN: [id number]  Maree Krabbe, LCSW

## 2017-11-27 ENCOUNTER — Inpatient Hospital Stay (HOSPITAL_COMMUNITY): Payer: Medicare Other

## 2017-11-27 DIAGNOSIS — I959 Hypotension, unspecified: Secondary | ICD-10-CM

## 2017-11-27 LAB — CBC WITH DIFFERENTIAL/PLATELET
Basophils Absolute: 0.1 10*3/uL (ref 0.0–0.1)
Basophils Relative: 1 %
EOS ABS: 0.1 10*3/uL (ref 0.0–0.7)
Eosinophils Relative: 2 %
HEMATOCRIT: 23.5 % — AB (ref 36.0–46.0)
HEMOGLOBIN: 7.3 g/dL — AB (ref 12.0–15.0)
LYMPHS ABS: 0.9 10*3/uL (ref 0.7–4.0)
Lymphocytes Relative: 13 %
MCH: 28.6 pg (ref 26.0–34.0)
MCHC: 31.1 g/dL (ref 30.0–36.0)
MCV: 92.2 fL (ref 78.0–100.0)
MONOS PCT: 5 %
Monocytes Absolute: 0.4 10*3/uL (ref 0.1–1.0)
NEUTROS ABS: 5.3 10*3/uL (ref 1.7–7.7)
NEUTROS PCT: 79 %
Platelets: 169 10*3/uL (ref 150–400)
RBC: 2.55 MIL/uL — AB (ref 3.87–5.11)
RDW: 18.5 % — ABNORMAL HIGH (ref 11.5–15.5)
WBC: 6.7 10*3/uL (ref 4.0–10.5)

## 2017-11-27 LAB — CBC
HEMATOCRIT: 29.7 % — AB (ref 36.0–46.0)
Hemoglobin: 9.2 g/dL — ABNORMAL LOW (ref 12.0–15.0)
MCH: 28.4 pg (ref 26.0–34.0)
MCHC: 31 g/dL (ref 30.0–36.0)
MCV: 91.7 fL (ref 78.0–100.0)
Platelets: 218 10*3/uL (ref 150–400)
RBC: 3.24 MIL/uL — AB (ref 3.87–5.11)
RDW: 18.7 % — ABNORMAL HIGH (ref 11.5–15.5)
WBC: 8.2 10*3/uL (ref 4.0–10.5)

## 2017-11-27 LAB — BASIC METABOLIC PANEL
Anion gap: 9 (ref 5–15)
BUN: 26 mg/dL — AB (ref 6–20)
CO2: 19 mmol/L — AB (ref 22–32)
CREATININE: 1.14 mg/dL — AB (ref 0.44–1.00)
Calcium: 8.4 mg/dL — ABNORMAL LOW (ref 8.9–10.3)
Chloride: 113 mmol/L — ABNORMAL HIGH (ref 101–111)
GFR calc Af Amer: 56 mL/min — ABNORMAL LOW (ref >60)
GFR calc non Af Amer: 48 mL/min — ABNORMAL LOW (ref >60)
Glucose, Bld: 92 mg/dL (ref 65–99)
POTASSIUM: 5.2 mmol/L — AB (ref 3.5–5.1)
SODIUM: 141 mmol/L (ref 135–145)

## 2017-11-27 LAB — CULTURE, BLOOD (ROUTINE X 2)
Culture: NO GROWTH
Special Requests: ADEQUATE

## 2017-11-27 LAB — PROCALCITONIN: Procalcitonin: 0.68 ng/mL

## 2017-11-27 MED ORDER — FUROSEMIDE 10 MG/ML IJ SOLN
40.0000 mg | Freq: Every day | INTRAMUSCULAR | Status: DC
  Filled 2017-11-27: qty 4

## 2017-11-27 NOTE — Clinical Social Work Note (Signed)
Clinical Social Work Assessment  Patient Details  Name: Beverly Wheeler MRN: [id number] Date of Birth: 1946-04-13  Date of referral:  11/27/17               Reason for consult:  Facility Placement, Family Concerns, Housing Concerns/Homelessness                Permission sought to share information with:  Facility Sport and exercise psychologist Permission granted to share information::  Yes, Verbal Permission Granted  Name::     Ship broker::  APS- Beverly Wheeler [phone]  Relationship::  1st cousin  Contact Information:     Housing/Transportation Living arrangements for the past 2 months:  Single Family Home Source of Information:  Patient, Other (Comment Required)(Beverly Wheeler- cousin) Patient Interpreter Needed:  None Criminal Activity/Legal Involvement Pertinent to Current Situation/Hospitalization:  No - Comment as needed Significant Relationships:  Adult Children, Siblings, Other Family Members Lives with:  Adult Children, Siblings Do you feel safe going back to the place where you live?  Yes Need for family participation in patient care:  Yes (Comment)(needs help from son with transportation an obtaining food)  Care giving concerns:  Pt lives at home and is the POA and primary caregiver for her sister who is non-verbal and has severe Alzheimers per report.  Pt also lives with son who has some sort of disorder though patient unable to specify which (thought was ADD but cousin thought was on the autism spectrum).  Per patient she is usually able to care for herself physically but relies on son to help obtain food and drive her to get medications and go to appointments.   Pt cousin very concerned about home situation- does not feel as if pt is safe to take care of her sister and believes this contributed to this hospital stay also raises concerns about home environment.  States home has bed bugs and that the refrigerator and stove don't work that they keep food in [employer] on their  porch.   Social Worker assessment / plan:  CSW met with pt and pt cousin to discuss safety concerns for patient and for her sister with Alzheimer's as well as PT recommendation for SNF stay for patient.  Patient reports that currently her sister is at home with her adult son Beverly Wheeler age 32) who is caring for her providing food etc.  Patient normally responsible for caring for her sister and assists with bathing- pt reports she does not believe her son is bathing patient but assists her to the restroom and other needs.  Pt feels as if her sister is safe with her son but pt cousin does not think that Beverly Wheeler is capable of caring for pt sister- does not specify why.  Pt cousin consistently states that she does not think that home environment is safe due to bed bugs and appliances not working.  Cannot list any other concerns or issues with the home but states she feel it will be condemned- CSW inquired about structural issues, holes in the floor etc but pt denies any such issues.  States that the oven part of the stove has been broken for about 2 years but stove top works states fridge has been broken about a week- cousin thinks its been longer but can't specify.  Pt reports that she never has issues getting her medications and always gets 3 meals a day- no concerns with malnutrition.  Pt cousin has already filed an APS report on behalf of pt sister and safety  concerns with her care at home despite consistent supervision from pt son and no specific concerns with why son is unable to care for pt.  Case has been assigned to [place] left message for her with pt permission to provide information that will be helpful to case.  Pt has been attempting to get placement for sister for about 2 years- was trying to get her disability states that an application had just arrived prior to hospital stay and she had planned to complete  Pt herself states she gets 2- 3 different checks from SSI and from [place]  benefits that her spouse had (he is now deceased).  CSW discussed PT recommendation for short term rehab- explained SNF and SNF referral process.    Cousin also inquired about what happens after SNF stay wants to explore consistent home aids for when pt returns home.  CSW explained that while insurance might provide short term services they would not supply many hours and would not supply long term- explained Medicaid as a possible long term assistance payee or private pay for aid assistance.     Employment status:  Retired Forensic scientist:  [employer] PT Recommendations:  [place] / Referral to community resources:  [place], [place] (Comment Required: [place], Name & Number of worker spoken with)  Patient/Family's Response to care:  Pt agreeable to CSW involvement and to CSW faxing out referral to SNF for possible rehab stay if she continues to be weak.  Pt ultimate goal is to return home and feels as if she can manage despite pt cousins concerns with home safety  Patient/Family's Understanding of and Emotional Response to Diagnosis, Current Treatment, and Prognosis:  Pt accepting of current weakness and limitations but hopeful she can return home at time of DC rather than require SNF stay.  Pt became tearful during interview- feels as if she has failed her sister since she can no longer care for her properly at home and is saddened by her sisters progression and inability to recognize anyone anymore or verbalize needs.  Emotional Assessment Appearance:  Appears stated age, Disheveled Attitude/Demeanor/Rapport:  Inconsistent Affect (typically observed):  Appropriate, Accepting, Pleasant, Tearful/Crying Orientation:  Oriented to Self, Oriented to Place, Oriented to  Time, Oriented to Situation Alcohol / Substance use:  Not Applicable Psych involvement (Current and /or in the community):  No (Comment)  Discharge Needs  Concerns to be addressed:   Discharge Planning Concerns, Home Safety Concerns, Care Coordination Readmission within the last 30 days:  No Current discharge risk:  Physical Impairment Barriers to Discharge:  Continued Medical Work up   Jorge Ny, LCSW 11/27/2017, 3:20 PM

## 2017-11-27 NOTE — Plan of Care (Signed)
  Problem: Education: Goal: Knowledge of General Education information will improve Outcome: Progressing   Problem: Activity: Goal: Risk for activity intolerance will decrease Outcome: Progressing   

## 2017-11-27 NOTE — Progress Notes (Signed)
PULMONARY / CRITICAL CARE MEDICINE   Name: Beverly Wheeler MRN: [id number] DOB: [birth year]    ADMISSION DATE:  11/22/2017 CONSULTATION DATE:  5/2  REFERRING MD:  [facility]    CHIEF COMPLAINT:  Hypotension   HISTORY OF PRESENT ILLNESS:   72 year old female former smoker (quit 4 years ago) with history of CAD with previous MI, COPD, hypertension who presented 5/1 with 1 week history of weakness, nausea vomiting, right greater than left lower extremity swelling with weeping ulcers.  In the emergency room she was noted to be in acute renal failure with creatinine of 5, and hyperkalemic with a potassium of 6.7.  She was initially admitted by the hospitalist with acute renal failure, lower extremity cellulitis.  She remained in the ER overnight where she has had persistent hypotension despite 4.5L fluids with systolic blood pressure in the 60s.  PCCM consulted for potential ICU admission.   SUBJECTIVE/OVERNIGHT:  No change overnight.  Remains on low dose neo - RN tried off this am and MAP dropped to 40's  Getting BLE dressing changes Denies chest pain, SOB, lightheadedness  Good UOP per RN although incontinent at times despite purewick   VITAL SIGNS: BP 108/71   Pulse 96   Temp 98.9 F (37.2 C) (Oral)   Resp 20   Ht 5\' 1"  (1.549 m)   Wt 85.2 kg (187 lb 13.3 oz)   SpO2 95%   BMI 35.49 kg/m   HEMODYNAMICS:    VENTILATOR SETTINGS:    INTAKE / OUTPUT: I/O last 3 completed shifts: In: 3859.2 [P.O.:360; I.V.:3149.2; IV Piggyback:350] Out: 2675 [Urine:2675]  PHYSICAL EXAMINATION: General: 72yo female, NAD getting BLE dressing changes  HEENT: mm moist, no JVD   Neuro: awake, alert, appropriate, MAE  CV: s1s2 rrr PULM: resps even non labored on [place], diminished  GI: soft, non-tender, bsx4 active  Extremities: Lower extremities with cellulitic changes, chronic ulcers, wrapped, BUE edema     LABS:  BMET Recent Labs  Lab 11/26/17 0208 11/26/17 1607 11/27/17 0623  NA 138 142 141   K 5.6* 5.2* 5.2*  CL 114* 114* 113*  CO2 18* 24 19*  BUN 33* 30* 26*  CREATININE 1.14* 1.14* 1.14*  GLUCOSE 181* 107* 92    Electrolytes Recent Labs  Lab 11/25/17 0238 11/26/17 0208 11/26/17 1607 11/27/17 0623  CALCIUM 8.2* 8.5* 8.8* 8.4*  MG 1.7  --   --   --   PHOS 4.4  --   --   --     CBC Recent Labs  Lab 11/25/17 0238 11/26/17 0208 11/27/17 0235  WBC 9.4 12.8* 6.7  HGB 10.1* 10.4* 7.3*  HCT 31.7* 33.5* 23.5*  PLT 168 248 169    Coag's No results for input(s): APTT, INR in the last 168 hours.  Sepsis Markers Recent Labs  Lab 11/22/17 1946 11/23/17 1145 11/24/17 0438 11/25/17 0238  LATICACIDVEN 0.86 0.9  --   --   PROCALCITON  --  32.30 13.75 3.84    ABG No results for input(s): PHART, PCO2ART, PO2ART in the last 168 hours.  Liver Enzymes Recent Labs  Lab 11/22/17 1937  AST 14*  ALT 9*  ALKPHOS 59  BILITOT 0.4  ALBUMIN 2.9*    Cardiac Enzymes No results for input(s): TROPONINI, PROBNP in the last 168 hours.  Glucose Recent Labs  Lab 11/23/17 2153 11/25/17 2118  GLUCAP 94 109*    Imaging Dg Chest Port 1 View  Result Date: 11/27/2017 CLINICAL DATA:  Shortness of breath, respiratory failure, history  COPD, hypertension, coronary artery disease post MI EXAM: PORTABLE CHEST 1 VIEW COMPARISON:  Portable exam 0557 hours compared to 11/26/2017 FINDINGS: Enlargement of cardiac silhouette with pulmonary vascular congestion. Atherosclerotic calcification aorta. Improved pulmonary edema. Small bibasilar effusions and atelectasis. No pneumothorax. Chronic RIGHT rotator cuff tear. IMPRESSION: Improved CHF. Electronically Signed   By: Ulyses Southward M.D.   On: 11/27/2017 09:31     STUDIES:  5/3  2D echo>> EF 35-45%, mild MR, PA peak pressure  CULTURES: Urine 5/1 >> multiple species recollected>> BC X2 5/1 >> coag neg staph 1/2  ANTIBIOTICS: Vancomycin 5/1 >> Zosyn 5/2 >>  SIGNIFICANT  EVENTS:   LINES/TUBES:   DISCUSSION: 72 year old with history of coronary artery disease status post MI 2015 admitted 5/1 with acute renal failure, hyperkalemia and hypotension.  Remains on low dose pressors.    ASSESSMENT / PLAN:   AKI - r/t dehydration, hypotension.  Hyperkalemia Plan- F/u chem  Gentle diuresis  Renal u/s    Hypotension - most likely r/t sepsis and hypovolemia. Weaning pressors but continues to require low dose Neo Septic shock   EF 35-40% - ?ischemic cardiomyopathy from prior MI  Hx CAD Plan- Wean Neo as tol  Continue midodrine  Hold hold home antihypertensives Consider cards input at some point  ASA  Decrease lasix as above  May need CVL for CVP if cannot get grasp on volume status - hold for now as she seems to be improving slowly  Cortisol wnl   Acute hypoxemic resp failure - improved  Acute pulm edema PLAN -  Decrease lasix to daily for now with ongoing pressor needs  Supplemental O2  Mobilize  F/u CXR    BLE?  Cellulitis -suspect underlying peripheral vascular disease. Plan- Continue vancomycin and Zosyn  Follow culture data Trend pct  Wound care Suspect she needs peripheral vascular disease work-up in the near future 1/2 blood cultures with coag neg staph  Re-culture urine   FAMILY  - Updates: pt updated 5/6, no family at bedside.  Discussed with RN bedside.    Likely needs SNF on d/c.  Will ask [facility] to resume care in am 5/7.  Minimal pressor needs, hemodynamically stable.    Dirk Dress, NP 11/27/2017  10:18 AM Pager: [phone] or [phone]

## 2017-11-27 NOTE — Progress Notes (Signed)
0235 am labs noted.  Redraw ordered for labs due to large differences in values and pt with no s/s of bleeding.  Will continue to monitor.

## 2017-11-28 ENCOUNTER — Encounter (HOSPITAL_COMMUNITY): Payer: Medicare Other

## 2017-11-28 ENCOUNTER — Inpatient Hospital Stay (HOSPITAL_COMMUNITY): Payer: Medicare Other

## 2017-11-28 LAB — CBC
HCT: 29.5 % — ABNORMAL LOW (ref 36.0–46.0)
HEMOGLOBIN: 9 g/dL — AB (ref 12.0–15.0)
MCH: 28.2 pg (ref 26.0–34.0)
MCHC: 30.5 g/dL (ref 30.0–36.0)
MCV: 92.5 fL (ref 78.0–100.0)
Platelets: 241 10*3/uL (ref 150–400)
RBC: 3.19 MIL/uL — AB (ref 3.87–5.11)
RDW: 18.6 % — ABNORMAL HIGH (ref 11.5–15.5)
WBC: 9.7 10*3/uL (ref 4.0–10.5)

## 2017-11-28 LAB — BASIC METABOLIC PANEL
Anion gap: 7 (ref 5–15)
BUN: 19 mg/dL (ref 6–20)
CO2: 25 mmol/L (ref 22–32)
Calcium: 8.3 mg/dL — ABNORMAL LOW (ref 8.9–10.3)
Chloride: 112 mmol/L — ABNORMAL HIGH (ref 101–111)
Creatinine, Ser: 1.28 mg/dL — ABNORMAL HIGH (ref 0.44–1.00)
GFR calc Af Amer: 48 mL/min — ABNORMAL LOW (ref >60)
GFR calc non Af Amer: 42 mL/min — ABNORMAL LOW (ref >60)
Glucose, Bld: 117 mg/dL — ABNORMAL HIGH (ref 65–99)
POTASSIUM: 4 mmol/L (ref 3.5–5.1)
SODIUM: 144 mmol/L (ref 135–145)

## 2017-11-28 LAB — MAGNESIUM: MAGNESIUM: 1.4 mg/dL — AB (ref 1.7–2.4)

## 2017-11-28 LAB — PROCALCITONIN: Procalcitonin: 0.49 ng/mL

## 2017-11-28 MED ORDER — ACETAMINOPHEN 325 MG PO TABS: 650.0000 mg | ORAL_TABLET | Freq: Four times a day (QID) | ORAL | Status: DC | PRN

## 2017-11-28 MED ORDER — FUROSEMIDE 20 MG PO TABS: 20.0000 mg | ORAL_TABLET | Freq: Every day | ORAL | Status: DC

## 2017-11-28 MED ORDER — MAGNESIUM SULFATE 50 % IJ SOLN
3.0000 g | Freq: Once | INTRAVENOUS | Status: AC
  Administered 2017-11-28: 3 g via INTRAVENOUS
  Filled 2017-11-28 (×2): qty 6

## 2017-11-28 MED ORDER — ACETAMINOPHEN 500 MG PO TABS: 1000.0000 mg | ORAL_TABLET | Freq: Four times a day (QID) | ORAL | Status: DC | PRN

## 2017-11-28 MED ORDER — CEFAZOLIN SODIUM-DEXTROSE 1-4 GM/50ML-% IV SOLN
1.0000 g | Freq: Three times a day (TID) | INTRAVENOUS | Status: DC
  Administered 2017-11-28 – 2017-12-01 (×9): 1 g via INTRAVENOUS
  Filled 2017-11-28 (×10): qty 50

## 2017-11-28 MED ORDER — MIDODRINE HCL 5 MG PO TABS
10.0000 mg | ORAL_TABLET | Freq: Three times a day (TID) | ORAL | Status: DC
  Administered 2017-11-28 – 2017-12-05 (×23): 10 mg via ORAL
  Filled 2017-11-28 (×23): qty 2

## 2017-11-28 MED ORDER — LORATADINE 10 MG PO TABS
10.0000 mg | ORAL_TABLET | Freq: Every day | ORAL | Status: DC | PRN
  Administered 2017-11-30: 10 mg via ORAL
  Filled 2017-11-28: qty 1

## 2017-11-28 NOTE — Progress Notes (Signed)
Physical Therapy Treatment Patient Details Name: Niasia Lennox MRN: [id number] DOB: 1946/04/17 Today's Date: 11/28/2017    History of Present Illness 72 year old female admitted with weakness, N/V, edema with acute renal failure and lower extremity cellulitis. Pt with hypotension acutely. PMhx: CAD, MI, COPD, HTN     PT Comments    Pt very pleasant and willing to mobilize this session. Pt reports pain in bil LE and RUE with soreness and continued edema. Pt with improved activity tolerance able to walk short distance in room and perform HEP. Pt with BP drop with change in position but MAP >60 throughout and pt asymptomatic. Will continue to follow and recommend OOB daily with nursing.   105/87 (94) supine 86/59 (69) HR 79 sitting EOB 124/95 (104) sitting after walk , HR 87  SpO2 93-100% on 3L throughout session  Follow Up Recommendations  SNF;Supervision/Assistance - 24 hour     Equipment Recommendations  Rolling walker with 5" wheels    Recommendations for Other Services       Precautions / Restrictions Precautions Precautions: Fall    Mobility  Bed Mobility Overal bed mobility: Needs Assistance Bed Mobility: Supine to Sit     Supine to sit: Min assist;HOB elevated     General bed mobility comments: min assist with UE assist to fully elevate trunk and pivot to EOB with increased time, HOB 30degrees  Transfers Overall transfer level: Needs assistance   Transfers: Sit to/from Stand Sit to Stand: Min guard         General transfer comment: cues for hand placement, safety, sequence and to control descent  Ambulation/Gait Ambulation/Gait assistance: Min guard Ambulation Distance (Feet): 30 Feet Assistive device: Rolling walker (2 wheeled) Gait Pattern/deviations: Step-through pattern;Decreased stride length;Trunk flexed   Gait velocity interpretation: <1.31 ft/sec, indicative of household ambulator General Gait Details: cues for safety, turning and stepping into  RW   Stairs             Wheelchair Mobility    Modified Rankin (Stroke Patients Only)       Balance Overall balance assessment: Needs assistance   Sitting balance-Leahy Scale: Fair       Standing balance-Leahy Scale: Poor Standing balance comment: bil UE support in standing                            Cognition Arousal/Alertness: Awake/alert Behavior During Therapy: WFL for tasks assessed/performed Overall Cognitive Status: Within Functional Limits for tasks assessed                                        Exercises General Exercises - Lower Extremity Ankle Circles/Pumps: AROM;10 reps;Seated;Both Long Arc Quad: 10 reps;Both;AROM;Seated Hip Flexion/Marching: AROM;10 reps;Seated;Both    General Comments        Pertinent Vitals/Pain Faces Pain Scale: Hurts even more Pain Location: legs and RUE Pain Descriptors / Indicators: Aching;Discomfort Pain Intervention(s): Limited activity within patient's tolerance;Repositioned    Home Living                      Prior Function            PT Goals (current goals can now be found in the care plan section) Progress towards PT goals: Progressing toward goals    Frequency           PT Plan Current  plan remains appropriate    Co-evaluation              AM-PAC PT "6 Clicks" Daily Activity  Outcome Measure  Difficulty turning over in bed (including adjusting bedclothes, sheets and blankets)?: Unable Difficulty moving from lying on back to sitting on the side of the bed? : Unable Difficulty sitting down on and standing up from a chair with arms (e.g., wheelchair, bedside commode, etc,.)?: A Little Help needed moving to and from a bed to chair (including a wheelchair)?: A Little Help needed walking in hospital room?: A Little Help needed climbing 3-5 steps with a railing? : A Lot 6 Click Score: 13    End of Session Equipment Utilized During Treatment: Gait  belt;Oxygen Activity Tolerance: Patient tolerated treatment well Patient left: in chair;with call bell/phone within reach;with chair alarm set Nurse Communication: Mobility status PT Visit Diagnosis: Pain;History of falling (Z91.81);Muscle weakness (generalized) (M62.81);Other abnormalities of gait and mobility (R26.89)     Time: 4098-1191 PT Time Calculation (min) (ACUTE ONLY): 35 min  Charges:  $Gait Training: 8-22 mins $Therapeutic Exercise: 8-22 mins                    G Codes:       Delaney Meigs, PT [phone]    Shalaine Payson B Kiearra Oyervides 11/28/2017, 12:00 PM

## 2017-11-28 NOTE — Progress Notes (Addendum)
[facility] TEAM 1 - Stepdown/ICU TEAM  Beverly Wheeler  WUJ:[id number] DOB: 01/09/1946 DOA: 11/22/2017 PCP: Patient, No Pcp Per    Brief Narrative:  72 year old female former smoker (quit 4 years ago) with history of CAD with previous MI, COPD, and hypertension who presented 5/1 with a 1 week history of weakness, nausea vomiting, right greater than left lower extremity swelling with weeping ulcers.  In the ED she was noted to be in acute renal failure with creatinine of 5, and hyperkalemic with a potassium of 6.7.  She admitted by the Hospitalist with acute renal failure and lower extremity cellulitis.  She had persistent hypotension despite 4.5L fluids with systolic blood pressure in the 60s.  PCCM was therefore consulted for ICU admission.  Significant Events: 5/1 admit 5/3 TTE - EF 35-40% - mild MR - PA peak 5/7 TRH resumed care  5/8 venous duplex B LE - no DVT  Subjective: The patient is resting comfortably in a bedside chair.  She has just worked with physical therapy.  Her blood pressure actually improved and doing so with her systolic 120 at the time of my evaluation.  She denies chest pain shortness of breath nausea or vomiting.  She does report some ongoing chronic lower leg pain and also reports poor appetite.  Assessment & Plan:  AKI Due to dehydration, hypotension - crt 5.08 at admission - renal fxn appears to have stabilized  Recent Labs  Lab 11/25/17 0238 11/26/17 0208 11/26/17 1607 11/27/17 0623 11/28/17 0326  CREATININE 1.50* 1.14* 1.14* 1.14* 1.28*    Hyperkalemia Corrected w/ improved renal fxn   Hypotension - Septic shock  Due to sepsis and hypovolemia - continues to require low dose Neo - cortisol appropriately elevated - maximize midodrine dose   Systolic CHF - EF 35-40% No WMA on exam - mild peripheral edema of arms and legs at present, suggestive of low oncotic state - hold diuretic today due to hypotension and resume po in AM   CAD ASA -  asymptomatic   Acute hypoxemic resp failure - Acute pulm edema clinically resolved   B LE Cellulitis suspect underlying PVD - will need outpt eval once renal fxn fully recovered   Normocytic anemia  likely due to kidney disease - check anemia panel   DVT prophylaxis: SQ heparin  Code Status: FULL CODE Family Communication: no family present at time of exam  Disposition Plan: ICU while on neo   Consultants:  PCCM  Antimicrobials:  Vancomycin 5/1 > 5/6 Zosyn 5/2 > 5/6 Ancef 5/7 > 5/9  Objective: Blood pressure (!) 86/59, pulse 100, temperature 99.3 F (37.4 C), temperature source Oral, resp. rate (!) 25, height 5\' 1"  (1.549 m), weight 85.2 kg (187 lb 13.3 oz), SpO2 100 %.  Intake/Output Summary (Last 24 hours) at 11/28/2017 1148 Last data filed at 11/28/2017 1100 Gross per 24 hour  Intake 1772.83 ml  Output 650 ml  Net 1122.83 ml   Filed Weights   11/23/17 1600 11/25/17 0500 11/27/17 0400  Weight: 78.6 kg (173 lb 4.5 oz) 84.6 kg (186 lb 8.2 oz) 85.2 kg (187 lb 13.3 oz)    Examination: General: No acute respiratory distress Lungs: Clear to auscultation bilaterally without wheezes or crackles Cardiovascular: Regular rate and rhythm without murmur  Abdomen: Nontender, nondistended, soft, bowel sounds positive, no rebound, no ascites, no appreciable mass Extremities: B LE wrapped - 1+ edema of arms and legs   CBC: Recent Labs  Lab 11/25/17 0238 11/26/17 0208 11/27/17 0235  11/27/17 1205 11/28/17 0326  WBC 9.4 12.8* 6.7 8.2 9.7  NEUTROABS 6.9 10.9* 5.3  --   --   HGB 10.1* 10.4* 7.3* 9.2* 9.0*  HCT 31.7* 33.5* 23.5* 29.7* 29.5*  MCV 88.5 91.3 92.2 91.7 92.5  PLT 168 248 169 218 241   Basic Metabolic Panel: Recent Labs  Lab 11/25/17 0238  11/26/17 1607 11/27/17 0623 11/28/17 0326  NA 138   < > 142 141 144  K 5.3*   < > 5.2* 5.2* 4.0  CL 114*   < > 114* 113* 112*  CO2 19*   < > 24 19* 25  GLUCOSE 103*   < > 107* 92 117*  BUN 51*   < > 30* 26* 19    CREATININE 1.50*   < > 1.14* 1.14* 1.28*  CALCIUM 8.2*   < > 8.8* 8.4* 8.3*  MG 1.7  --   --   --   --   PHOS 4.4  --   --   --   --    < > = values in this interval not displayed.   GFR: Estimated Creatinine Clearance: 41.1 mL/min (A) (by C-G formula based on SCr of 1.28 mg/dL (H)).  Liver Function Tests: Recent Labs  Lab 11/22/17 1937  AST 14*  ALT 9*  ALKPHOS 59  BILITOT 0.4  PROT 6.4*  ALBUMIN 2.9*    HbA1C: Hgb A1c MFr Bld  Date/Time Value Ref Range Status  12/11/2009 07:55 AM 6.3 4.6 - 6.5 % Final    Comment:    See lab report for associated comment(s)    CBG: Recent Labs  Lab 11/23/17 2153 11/25/17 2118  GLUCAP 94 109*    Recent Results (from the past 240 hour(s))  Culture, blood (Routine X 2) w Reflex to ID Panel     Status: Abnormal   Collection Time: 11/22/17  9:30 PM  Result Value Ref Range Status   Specimen Description BLOOD LEFT ARM  Final   Special Requests   Final    BOTTLES DRAWN AEROBIC AND ANAEROBIC Blood Culture adequate volume   Culture  Setup Time   Final    ANAEROBIC BOTTLE ONLY GRAM POSITIVE COCCI CRITICAL RESULT CALLED TO, READ BACK BY AND VERIFIED WITH: J LEDFORD PHARMD 11/24/17 0650 JDW    Culture (A)  Final    STAPHYLOCOCCUS SPECIES (COAGULASE NEGATIVE) THE SIGNIFICANCE OF ISOLATING THIS ORGANISM FROM A SINGLE SET OF BLOOD CULTURES WHEN MULTIPLE SETS ARE DRAWN IS UNCERTAIN. PLEASE NOTIFY THE MICROBIOLOGY DEPARTMENT WITHIN ONE WEEK IF SPECIATION AND SENSITIVITIES ARE REQUIRED. Performed at [facility] Lab, [address], [place], [place] [address]    Report Status 11/26/2017 FINAL  Final  Blood Culture ID Panel (Reflexed)     Status: None   Collection Time: 11/22/17  9:30 PM  Result Value Ref Range Status   Enterococcus species NOT DETECTED NOT DETECTED Final    Comment: CRITICAL RESULT CALLED TO, READ BACK BY AND VERIFIED WITH: J LEDFORD PHARMD 11/24/17 0650 JDW    Listeria monocytogenes NOT DETECTED NOT DETECTED Final    Staphylococcus species NOT DETECTED NOT DETECTED Final   Staphylococcus aureus NOT DETECTED NOT DETECTED Final   Streptococcus species NOT DETECTED NOT DETECTED Final   Streptococcus agalactiae NOT DETECTED NOT DETECTED Final   Streptococcus pneumoniae NOT DETECTED NOT DETECTED Final   Streptococcus pyogenes NOT DETECTED NOT DETECTED Final   Acinetobacter baumannii NOT DETECTED NOT DETECTED Final   Enterobacteriaceae species NOT DETECTED NOT  DETECTED Final   Enterobacter cloacae complex NOT DETECTED NOT DETECTED Final   Escherichia coli NOT DETECTED NOT DETECTED Final   Klebsiella oxytoca NOT DETECTED NOT DETECTED Final   Klebsiella pneumoniae NOT DETECTED NOT DETECTED Final   Proteus species NOT DETECTED NOT DETECTED Final   Serratia marcescens NOT DETECTED NOT DETECTED Final   Haemophilus influenzae NOT DETECTED NOT DETECTED Final   Neisseria meningitidis NOT DETECTED NOT DETECTED Final   Pseudomonas aeruginosa NOT DETECTED NOT DETECTED Final   Candida albicans NOT DETECTED NOT DETECTED Final   Candida glabrata NOT DETECTED NOT DETECTED Final   Candida krusei NOT DETECTED NOT DETECTED Final   Candida parapsilosis NOT DETECTED NOT DETECTED Final   Candida tropicalis NOT DETECTED NOT DETECTED Final    Comment: Performed at [facility] Lab, [address] [place], [place] [address]  Culture, blood (Routine X 2) w Reflex to ID Panel     Status: None   Collection Time: 11/22/17  9:51 PM  Result Value Ref Range Status   Specimen Description BLOOD RIGHT ANTECUBITAL  Final   Special Requests   Final    BOTTLES DRAWN AEROBIC AND ANAEROBIC Blood Culture adequate volume   Culture   Final    NO GROWTH 5 DAYS Performed at [facility], [address], [place], [place] [address]    Report Status 11/27/2017 FINAL  Final  Urine Culture     Status: Abnormal   Collection Time: 11/22/17 11:19 PM  Result Value Ref Range Status   Specimen Description URINE, CATHETERIZED  Final   Special  Requests   Final    NONE Performed at [facility], [address] [place], [place] [address]    Culture MULTIPLE SPECIES PRESENT, SUGGEST RECOLLECTION (A)  Final   Report Status 11/24/2017 FINAL  Final  MRSA PCR Screening     Status: None   Collection Time: 11/23/17  4:14 PM  Result Value Ref Range Status   MRSA by PCR NEGATIVE NEGATIVE Final    Comment:        The GeneXpert MRSA Assay (FDA approved for NASAL specimens only), is one component of a comprehensive MRSA colonization surveillance program. It is not intended to diagnose MRSA infection nor to guide or monitor treatment for MRSA infections. Performed at [facility], [address], [place], [place] [address]      Scheduled Meds: . ammonium lactate   Topical q morning - 10a  . aspirin  81 mg Oral Daily  . atorvastatin  80 mg Oral q1800  . famotidine  10 mg Oral Daily  . fenofibrate  54 mg Oral Daily  . [START ON 11/29/2017] furosemide  20 mg Oral Daily  . heparin  5,000 Units Subcutaneous Q8H  . liver oil-zinc oxide   Topical q morning - 10a  . mouth rinse  15 mL Mouth Rinse BID  . midodrine  10 mg Oral TID WC  . multivitamin with minerals  1 tablet Oral Daily  . triamcinolone  2 spray Nasal Daily   Continuous Infusions: . sodium chloride 10 mL/hr at 11/28/17 0800  .  ceFAZolin (ANCEF) IV    . phenylephrine (NEO-SYNEPHRINE) Adult infusion 28 mcg/min (11/28/17 0800)     LOS: 6 days   Lonia Blood, MD [facility] Office  [phone] Pager - Text Page per Amion as per below:  On-Call/Text Page:      [email or web address]      password [email or web address]  If  7PM-7AM, please contact night-coverage [email or web address] Password [email or web address] 11/28/2017, 11:48 AM

## 2017-11-29 ENCOUNTER — Inpatient Hospital Stay (HOSPITAL_COMMUNITY): Payer: Medicare Other

## 2017-11-29 DIAGNOSIS — R609 Edema, unspecified: Secondary | ICD-10-CM

## 2017-11-29 LAB — CBC
HCT: 29 % — ABNORMAL LOW (ref 36.0–46.0)
HEMOGLOBIN: 8.8 g/dL — AB (ref 12.0–15.0)
MCH: 28.2 pg (ref 26.0–34.0)
MCHC: 30.3 g/dL (ref 30.0–36.0)
MCV: 92.9 fL (ref 78.0–100.0)
PLATELETS: 227 10*3/uL (ref 150–400)
RBC: 3.12 MIL/uL — ABNORMAL LOW (ref 3.87–5.11)
RDW: 18.5 % — ABNORMAL HIGH (ref 11.5–15.5)
WBC: 7.2 10*3/uL (ref 4.0–10.5)

## 2017-11-29 LAB — URINE CULTURE: Culture: <10000 — AB

## 2017-11-29 LAB — VITAMIN B12: Vitamin B-12: 293 pg/mL (ref 180–914)

## 2017-11-29 LAB — RETICULOCYTES
RBC.: 3.12 MIL/uL — ABNORMAL LOW (ref 3.87–5.11)
RETIC CT PCT: 2.1 % (ref 0.4–3.1)
Retic Count, Absolute: 65.5 10*3/uL (ref 19.0–186.0)

## 2017-11-29 LAB — COMPREHENSIVE METABOLIC PANEL
ALBUMIN: 2.2 g/dL — AB (ref 3.5–5.0)
ALK PHOS: 43 U/L (ref 38–126)
ALT: 10 U/L — ABNORMAL LOW (ref 14–54)
ANION GAP: 5 (ref 5–15)
AST: 22 U/L (ref 15–41)
BILIRUBIN TOTAL: 0.3 mg/dL (ref 0.3–1.2)
BUN: 14 mg/dL (ref 6–20)
CALCIUM: 8.2 mg/dL — AB (ref 8.9–10.3)
CO2: 28 mmol/L (ref 22–32)
Chloride: 110 mmol/L (ref 101–111)
Creatinine, Ser: 1.16 mg/dL — ABNORMAL HIGH (ref 0.44–1.00)
GFR calc non Af Amer: 47 mL/min — ABNORMAL LOW (ref >60)
GFR, EST AFRICAN AMERICAN: 54 mL/min — AB (ref >60)
GLUCOSE: 114 mg/dL — AB (ref 65–99)
Potassium: 3.9 mmol/L (ref 3.5–5.1)
Sodium: 143 mmol/L (ref 135–145)
TOTAL PROTEIN: 5.1 g/dL — AB (ref 6.5–8.1)

## 2017-11-29 LAB — IRON AND TIBC
Iron: 28 ug/dL (ref 28–170)
SATURATION RATIOS: 9 % — AB (ref 10.4–31.8)
TIBC: 300 ug/dL (ref 250–450)
UIBC: 272 ug/dL

## 2017-11-29 LAB — FOLATE: Folate: 5 ng/mL — ABNORMAL LOW (ref >5.9)

## 2017-11-29 LAB — MAGNESIUM: Magnesium: 2.1 mg/dL (ref 1.7–2.4)

## 2017-11-29 LAB — FERRITIN: FERRITIN: 53 ng/mL (ref 11–307)

## 2017-11-29 MED ORDER — ALBUTEROL SULFATE (2.5 MG/3ML) 0.083% IN NEBU
INHALATION_SOLUTION | RESPIRATORY_TRACT | Status: AC
  Filled 2017-11-29: qty 3

## 2017-11-29 MED ORDER — FUROSEMIDE 40 MG PO TABS
40.0000 mg | ORAL_TABLET | Freq: Every day | ORAL | Status: DC
  Administered 2017-11-29 – 2017-12-01 (×3): 40 mg via ORAL
  Filled 2017-11-29 (×3): qty 1

## 2017-11-29 MED ORDER — ALBUTEROL SULFATE (2.5 MG/3ML) 0.083% IN NEBU
2.5000 mg | INHALATION_SOLUTION | RESPIRATORY_TRACT | Status: DC | PRN
  Administered 2017-11-29: 2.5 mg via RESPIRATORY_TRACT

## 2017-11-29 NOTE — Progress Notes (Signed)
CSW met with patient and provided current bed offers- no choice at this time  Beverly Wheeler, [place] Social Worker [phone]

## 2017-11-29 NOTE — Progress Notes (Signed)
[facility] PROGRESS NOTE  Beverly Wheeler JYN:[id number] DOB: 24-May-1946 DOA: 11/22/2017 PCP: Patient, No Pcp Per  Brief summary   72 year old female former smoker (quit 4 years ago) with history of CAD with previous MI, COPD, and hypertension who presented 5/1 with a 1 week history of weakness, nausea vomiting, right greater than left lower extremity swelling with weeping ulcers. In the ED she was noted to be in acute renal failure with creatinine of 5, and hyperkalemic with a potassium of 6.7.  She admitted by the Hospitalist with acute renal failure and lower extremity cellulitis. She had persistent hypotension despite 4.5L fluidswith systolic blood pressure in the 60s. PCCM was therefore consulted for ICU admission.  Significant Events: 5/1 admit 5/3 TTE - EF 35-40% - mild MR - PA peak 5/7 TRH resumed care   Assessment & Plan:  AKI. Due to dehydration, hypotension, also on irbesartan at home - crt 5.08 at admission - renal fxn appears to have stabilized  Hyperkalemia. Corrected w/ improved renal fxn   Hypotension - Septic shock. Due to sepsis and hypovolemia - continues to require low dose Neo - cortisol appropriately elevated - maximize midodrine dose. Wean from neo   Systolic CHF - EF 35-40%. mild congested today. Crackles on exam, edema. Will try oral lasix today. If no improvement or hypotension. Will try iv albumin,/lasix. Monitor closely due to hypotension. Monitor urine output, renal labs, I/o   CAD. ASA - asymptomatic   Acute hypoxemic resp failure- Acute pulm edema. clinically improving   B LE Cellulitis. suspect underlying PVD - will need outpt eval once renal fxn fully recovered   Normocytic anemia. likely due to kidney disease - check anemia panel    Code Status: full Family Communication: d/w patient, RN (indicate person spoken with, relationship, and if by phone, the number) Disposition Plan: remains inpatient     Consultants:  PCCM  Procedures:  Eco   Antibiotics: Vancomycin 5/1 > 5/6 Zosyn 5/2 > 5/6    (indicate start date, and stop date if known)  HPI/Subjective: Reports mild dyspnea. Congested. Still on low dose iv neo.   Objective: Vitals:   11/29/17 0630 11/29/17 0700  BP: (!) 107/49 (!) 107/51  Pulse: 79 79  Resp: (!) 23 (!) 21  Temp:  98.8 F (37.1 C)  SpO2: 94% 95%    Intake/Output Summary (Last 24 hours) at 11/29/2017 0828 Last data filed at 11/29/2017 0500 Gross per 24 hour  Intake 1420.78 ml  Output 200 ml  Net 1220.78 ml   Filed Weights   11/25/17 0500 11/27/17 0400 11/29/17 0630  Weight: 84.6 kg (186 lb 8.2 oz) 85.2 kg (187 lb 13.3 oz) 85.8 kg (189 lb 1.6 oz)    Exam:   General:  No distress   Cardiovascular: s1,s2 rrr  Respiratory: BL crackles LL  Abdomen: soft, obese. nt   Musculoskeletal: leg edema    Data Reviewed: Basic Metabolic Panel: Recent Labs  Lab 11/25/17 0238 11/26/17 0208 11/26/17 1607 11/27/17 0623 11/28/17 0326 11/28/17 1200 11/29/17 0248  NA 138 138 142 141 144  --  143  K 5.3* 5.6* 5.2* 5.2* 4.0  --  3.9  CL 114* 114* 114* 113* 112*  --  110  CO2 19* 18* 24 19* 25  --  28  GLUCOSE 103* 181* 107* 92 117*  --  114*  BUN 51* 33* 30* 26* 19  --  14  CREATININE 1.50* 1.14* 1.14* 1.14* 1.28*  --  1.16*  CALCIUM 8.2*  8.5* 8.8* 8.4* 8.3*  --  8.2*  MG 1.7  --   --   --   --  1.4* 2.1  PHOS 4.4  --   --   --   --   --   --    Liver Function Tests: Recent Labs  Lab 11/22/17 1937 11/29/17 0248  AST 14* 22  ALT 9* 10*  ALKPHOS 59 43  BILITOT 0.4 0.3  PROT 6.4* 5.1*  ALBUMIN 2.9* 2.2*   No results for input(s): LIPASE, AMYLASE in the last 168 hours. No results for input(s): AMMONIA in the last 168 hours. CBC: Recent Labs  Lab 11/22/17 1937  11/25/17 0238 11/26/17 0208 11/27/17 0235 11/27/17 1205 11/28/17 0326 11/29/17 0248  WBC 10.2   < > 9.4 12.8* 6.7 8.2 9.7 7.2  NEUTROABS 8.1*  --  6.9 10.9* 5.3  --    --   --   HGB 10.8*   < > 10.1* 10.4* 7.3* 9.2* 9.0* 8.8*  HCT 33.4*   < > 31.7* 33.5* 23.5* 29.7* 29.5* 29.0*  MCV 86.8   < > 88.5 91.3 92.2 91.7 92.5 92.9  PLT 259   < > 168 248 169 218 241 227   < > = values in this interval not displayed.   Cardiac Enzymes: No results for input(s): CKTOTAL, CKMB, CKMBINDEX, TROPONINI in the last 168 hours. BNP (last 3 results) No results for input(s): BNP in the last 8760 hours.  ProBNP (last 3 results) No results for input(s): PROBNP in the last 8760 hours.  CBG: Recent Labs  Lab 11/23/17 2153 11/25/17 2118  GLUCAP 94 109*    Recent Results (from the past 240 hour(s))  Culture, blood (Routine X 2) w Reflex to ID Panel     Status: Abnormal   Collection Time: 11/22/17  9:30 PM  Result Value Ref Range Status   Specimen Description BLOOD LEFT ARM  Final   Special Requests   Final    BOTTLES DRAWN AEROBIC AND ANAEROBIC Blood Culture adequate volume   Culture  Setup Time   Final    ANAEROBIC BOTTLE ONLY GRAM POSITIVE COCCI CRITICAL RESULT CALLED TO, READ BACK BY AND VERIFIED WITH: J LEDFORD PHARMD 11/24/17 0650 JDW    Culture (A)  Final    STAPHYLOCOCCUS SPECIES (COAGULASE NEGATIVE) THE SIGNIFICANCE OF ISOLATING THIS ORGANISM FROM A SINGLE SET OF BLOOD CULTURES WHEN MULTIPLE SETS ARE DRAWN IS UNCERTAIN. PLEASE NOTIFY THE MICROBIOLOGY DEPARTMENT WITHIN ONE WEEK IF SPECIATION AND SENSITIVITIES ARE REQUIRED. Performed at [facility], [address] [place], [place] [address]    Report Status 11/26/2017 FINAL  Final  Blood Culture ID Panel (Reflexed)     Status: None   Collection Time: 11/22/17  9:30 PM  Result Value Ref Range Status   Enterococcus species NOT DETECTED NOT DETECTED Final    Comment: CRITICAL RESULT CALLED TO, READ BACK BY AND VERIFIED WITH: J LEDFORD PHARMD 11/24/17 0650 JDW    Listeria monocytogenes NOT DETECTED NOT DETECTED Final   Staphylococcus species NOT DETECTED NOT DETECTED Final   Staphylococcus aureus NOT  DETECTED NOT DETECTED Final   Streptococcus species NOT DETECTED NOT DETECTED Final   Streptococcus agalactiae NOT DETECTED NOT DETECTED Final   Streptococcus pneumoniae NOT DETECTED NOT DETECTED Final   Streptococcus pyogenes NOT DETECTED NOT DETECTED Final   Acinetobacter baumannii NOT DETECTED NOT DETECTED Final   Enterobacteriaceae species NOT DETECTED NOT DETECTED Final   Enterobacter cloacae complex NOT DETECTED NOT  DETECTED Final   Escherichia coli NOT DETECTED NOT DETECTED Final   Klebsiella oxytoca NOT DETECTED NOT DETECTED Final   Klebsiella pneumoniae NOT DETECTED NOT DETECTED Final   Proteus species NOT DETECTED NOT DETECTED Final   Serratia marcescens NOT DETECTED NOT DETECTED Final   Haemophilus influenzae NOT DETECTED NOT DETECTED Final   Neisseria meningitidis NOT DETECTED NOT DETECTED Final   Pseudomonas aeruginosa NOT DETECTED NOT DETECTED Final   Candida albicans NOT DETECTED NOT DETECTED Final   Candida glabrata NOT DETECTED NOT DETECTED Final   Candida krusei NOT DETECTED NOT DETECTED Final   Candida parapsilosis NOT DETECTED NOT DETECTED Final   Candida tropicalis NOT DETECTED NOT DETECTED Final    Comment: Performed at [facility], [address] [place], [place] [address]  Culture, blood (Routine X 2) w Reflex to ID Panel     Status: None   Collection Time: 11/22/17  9:51 PM  Result Value Ref Range Status   Specimen Description BLOOD RIGHT ANTECUBITAL  Final   Special Requests   Final    BOTTLES DRAWN AEROBIC AND ANAEROBIC Blood Culture adequate volume   Culture   Final    NO GROWTH 5 DAYS Performed at [facility], [address] [place], [place] [address]    Report Status 11/27/2017 FINAL  Final  Urine Culture     Status: Abnormal   Collection Time: 11/22/17 11:19 PM  Result Value Ref Range Status   Specimen Description URINE, CATHETERIZED  Final   Special Requests   Final    NONE Performed at [facility], [address]  [place], [place] [address]    Culture MULTIPLE SPECIES PRESENT, SUGGEST RECOLLECTION (A)  Final   Report Status 11/24/2017 FINAL  Final  MRSA PCR Screening     Status: None   Collection Time: 11/23/17  4:14 PM  Result Value Ref Range Status   MRSA by PCR NEGATIVE NEGATIVE Final    Comment:        The GeneXpert MRSA Assay (FDA approved for NASAL specimens only), is one component of a comprehensive MRSA colonization surveillance program. It is not intended to diagnose MRSA infection nor to guide or monitor treatment for MRSA infections. Performed at [facility] Lab, [address] [place], [place] [address]   Culture, Urine     Status: Abnormal   Collection Time: 11/27/17  8:46 PM  Result Value Ref Range Status   Specimen Description URINE, RANDOM  Final   Special Requests NONE  Final   Culture (A)  Final    <10,000 COLONIES/mL INSIGNIFICANT GROWTH Performed at [facility], [address], [place], [place] [address]    Report Status 11/29/2017 FINAL  Final     Studies: US Renal  Result Date: 11/27/2017 CLINICAL DATA:  Acute renal failure. EXAM: RENAL / URINARY TRACT ULTRASOUND COMPLETE COMPARISON:  None. FINDINGS: Right Kidney: Length: 9.7 cm. Echogenicity within normal limits. No mass or hydronephrosis visualized. Left Kidney: Length: 10.8 cm. No significant abnormalities. Dromedary hump seen laterally on the left. Bladder: Appears normal for degree of bladder distention. IMPRESSION: No cause for renal failure identified.  Normal study. Electronically Signed   By: Gerome Sam III M.D   On: 11/27/2017 11:53   Dg Chest Portable 1 View  Result Date: 11/28/2017 CLINICAL DATA:  Shortness of breath, suspect fluid overload. History of coronary artery disease and previous MI, COPD, sepsis, lower extremity cellulitis. EXAM: PORTABLE CHEST 1 VIEW  COMPARISON:  Portable chest x-ray of Nov 27, 2016 FINDINGS: The lungs remain hypoinflated. There is increased density in left retrocardiac  region. There is increased density at the right lung base. The cardiac silhouette is enlarged. The pulmonary vascularity is engorged. The interstitial markings are mildly increased. There is calcification in the wall of the aortic arch. There is multilevel degenerative disc disease of the thoracic spine. IMPRESSION: CHF with mild interstitial edema. Bilateral pleural effusions layering inferiorly and slightly posteriorly on the right. Probable left lower lobe atelectasis or pneumonia. Thoracic aortic atherosclerosis. Electronically Signed   By: David  [place] M.D.   On: 11/28/2017 10:01    Scheduled Meds: . ammonium lactate   Topical q morning - 10a  . aspirin  81 mg Oral Daily  . atorvastatin  80 mg Oral q1800  . famotidine  10 mg Oral Daily  . fenofibrate  54 mg Oral Daily  . furosemide  20 mg Oral Daily  . heparin  5,000 Units Subcutaneous Q8H  . liver oil-zinc oxide   Topical q morning - 10a  . mouth rinse  15 mL Mouth Rinse BID  . midodrine  10 mg Oral TID WC  . multivitamin with minerals  1 tablet Oral Daily  . triamcinolone  2 spray Nasal Daily   Continuous Infusions: . sodium chloride 20 mL/hr at 11/29/17 0500  .  ceFAZolin (ANCEF) IV 1 g (11/29/17 4098)  . phenylephrine (NEO-SYNEPHRINE) Adult infusion 10 mcg/min (11/29/17 0500)    Active Problems:   Acute renal failure (ARF) (HCC)   Hypotension   Pressure injury of skin   Acute kidney injury (HCC)   Cellulitis of left lower extremity   Sepsis (HCC)   Septic shock (HCC)   Hypovolemic shock (HCC)   Hyperkalemia   Acute respiratory failure with hypoxia (HCC)    Time spent: >35 minutes     Esperanza Sheets  [facility] Pager [phone]. If 7PM-7AM, please contact night-coverage at [email or web address], password [facility] 11/29/2017, 8:28 AM  LOS: 7 days

## 2017-11-29 NOTE — Progress Notes (Signed)
Preliminary results by tech - Lower Ext. Venous Duplex Completed. Negative for deep and superficial vein thrombosis in both legs. Tommey Barret, BS, RDMS, RVT  

## 2017-11-29 NOTE — Progress Notes (Signed)
OT Cancellation Note  Patient Details Name: Beverly Wheeler MRN: [id number] DOB: 16-Sep-1945   Cancelled Treatment:    Reason Eval/Treat Not Completed: Patient at procedure or test/ unavailable(Vascular Lab)  Evern Bio Nathanie Ottley 11/29/2017, 11:05 AM  Sherryl Manges OTR/L [phone]

## 2017-11-30 LAB — CBC
HCT: 28.5 % — ABNORMAL LOW (ref 36.0–46.0)
Hemoglobin: 8.7 g/dL — ABNORMAL LOW (ref 12.0–15.0)
MCH: 28.3 pg (ref 26.0–34.0)
MCHC: 30.5 g/dL (ref 30.0–36.0)
MCV: 92.8 fL (ref 78.0–100.0)
PLATELETS: 205 10*3/uL (ref 150–400)
RBC: 3.07 MIL/uL — AB (ref 3.87–5.11)
RDW: 18.1 % — ABNORMAL HIGH (ref 11.5–15.5)
WBC: 8.1 10*3/uL (ref 4.0–10.5)

## 2017-11-30 LAB — BASIC METABOLIC PANEL
Anion gap: 6 (ref 5–15)
BUN: 10 mg/dL (ref 6–20)
CHLORIDE: 109 mmol/L (ref 101–111)
CO2: 27 mmol/L (ref 22–32)
Calcium: 8.3 mg/dL — ABNORMAL LOW (ref 8.9–10.3)
Creatinine, Ser: 1.03 mg/dL — ABNORMAL HIGH (ref 0.44–1.00)
GFR calc Af Amer: >60 mL/min (ref >60)
GFR calc non Af Amer: 54 mL/min — ABNORMAL LOW (ref >60)
Glucose, Bld: 96 mg/dL (ref 65–99)
Potassium: 4 mmol/L (ref 3.5–5.1)
Sodium: 142 mmol/L (ref 135–145)

## 2017-11-30 NOTE — Progress Notes (Signed)
[facility] PROGRESS NOTE  Beverly Wheeler ZOX:[id number] DOB: [birth year] DOA: 11/22/2017 PCP: Patient, No Pcp Per  Brief summary   72 year old female former smoker (quit 4 years ago) with history of CAD with previous MI, COPD, and hypertension who presented 5/1 with a 1 week history of weakness, nausea vomiting, right greater than left lower extremity swelling with weeping ulcers. In the ED she was noted to be in acute renal failure with creatinine of 5, and hyperkalemic with a potassium of 6.7.  She admitted by the Hospitalist with acute renal failure and lower extremity cellulitis. She had persistent hypotension despite 4.5L fluidswith systolic blood pressure in the 60s. PCCM was therefore consulted for ICU admission. -TF form ICU to SDU today.   Assessment & Plan:  AKI. Due to dehydration, hypotension, also on irbesartan at home - crt 5.08 at admission - renal fxn appears to have stabilized  Hyperkalemia. Corrected w/ improved renal fxn   Hypotension - Septic shock. Due to sepsis and hypovolemia - weaned off from Neo on 5/8. - cortisol appropriately elevated - maximized midodrine dose.   Systolic CHF - EF 35-40%. mild congested. Crackles on exam, edema. tolerating oral lasix 24 hrs. If no improvement or hypotension. Will try iv albumin/lasix. Monitor closely due to hypotension. Monitor urine output, renal labs, I/o   CAD. ASA - asymptomatic   Acute hypoxemic resp failure- Acute pulm edema. clinically improving   B LE Cellulitis. suspect underlying PVD. On iv cefazolin. Will transition to PO in 24-48 hrs.  - will need outpt eval once renal fxn fully recovered   Normocytic anemia. likely due to kidney disease - check anemia panel    Code Status: full Family Communication: d/w patient, RN (indicate person spoken with, relationship, and if by phone, the number) Disposition Plan: remains inpatient   Significant Events: 5/1 admit 5/3 TTE - EF 35-40% - mild MR - PA  peak 5/7 TR[facility] resumed care    Consultants:  PCCM  Procedures:  Eco   Antibiotics: Vancomycin 5/1 > 5/6 Zosyn 5/2 > 5/6    (indicate start date, and stop date if known)  HPI/Subjective: Reports mild dyspnea. Congested. Still on low dose iv neo.   Objective: Vitals:   11/30/17 0700 11/30/17 0705  BP: (!) 103/46 107/62  Pulse: 71   Resp: (!) 25   Temp:    SpO2: 97%     Intake/Output Summary (Last 24 hours) at 11/30/2017 0750 Last data filed at 11/30/2017 0730 Gross per 24 hour  Intake 917.05 ml  Output 1335 ml  Net -417.95 ml   Filed Weights   11/27/17 0400 11/29/17 0630 11/30/17 0600  Weight: 85.2 kg (187 lb 13.3 oz) 85.8 kg (189 lb 1.6 oz) 86.7 kg (191 lb 2.2 oz)    Exam:   General:  No distress   Cardiovascular: s1,s2 rrr  Respiratory: BL crackles LL  Abdomen: soft, obese. nt   Musculoskeletal: leg edema    Data Reviewed: Basic Metabolic Panel: Recent Labs  Lab 11/25/17 0238  11/26/17 1607 11/27/17 0623 11/28/17 0326 11/28/17 1200 11/29/17 0248 11/30/17 0255  NA 138   < > 142 141 144  --  143 142  K 5.3*   < > 5.2* 5.2* 4.0  --  3.9 4.0  CL 114*   < > 114* 113* 112*  --  110 109  CO2 19*   < > 24 19* 25  --  28 27  GLUCOSE 103*   < > 107* 92 117*  --  114* 96  BUN 51*   < > 30* 26* 19  --  14 10  CREATININE 1.50*   < > 1.14* 1.14* 1.28*  --  1.16* 1.03*  CALCIUM 8.2*   < > 8.8* 8.4* 8.3*  --  8.2* 8.3*  MG 1.7  --   --   --   --  1.4* 2.1  --   PHOS 4.4  --   --   --   --   --   --   --    < > = values in this interval not displayed.   Liver Function Tests: Recent Labs  Lab 11/29/17 0248  AST 22  ALT 10*  ALKPHOS 43  BILITOT 0.3  PROT 5.1*  ALBUMIN 2.2*   No results for input(s): LIPASE, AMYLASE in the last 168 hours. No results for input(s): AMMONIA in the last 168 hours. CBC: Recent Labs  Lab 11/25/17 0238 11/26/17 0208 11/27/17 0235 11/27/17 1205 11/28/17 0326 11/29/17 0248 11/30/17 0255  WBC 9.4 12.8* 6.7 8.2  9.7 7.2 8.1  NEUTROABS 6.9 10.9* 5.3  --   --   --   --   HGB 10.1* 10.4* 7.3* 9.2* 9.0* 8.8* 8.7*  HCT 31.7* 33.5* 23.5* 29.7* 29.5* 29.0* 28.5*  MCV 88.5 91.3 92.2 91.7 92.5 92.9 92.8  PLT 168 248 169 218 241 227 205   Cardiac Enzymes: No results for input(s): CKTOTAL, CKMB, CKMBINDEX, TROPONINI in the last 168 hours. BNP (last 3 results) No results for input(s): BNP in the last 8760 hours.  ProBNP (last 3 results) No results for input(s): PROBNP in the last 8760 hours.  CBG: Recent Labs  Lab 11/23/17 2153 11/25/17 2118  GLUCAP 94 109*    Recent Results (from the past 240 hour(s))  Culture, blood (Routine X 2) w Reflex to ID Panel     Status: Abnormal   Collection Time: 11/22/17  9:30 PM  Result Value Ref Range Status   Specimen Description BLOOD LEFT ARM  Final   Special Requests   Final    BOTTLES DRAWN AEROBIC AND ANAEROBIC Blood Culture adequate volume   Culture  Setup Time   Final    ANAEROBIC BOTTLE ONLY GRAM POSITIVE COCCI CRITICAL RESULT CALLED TO, READ BACK BY AND VERIFIED WITH: J LEDFORD PHARMD 11/24/17 0650 JDW    Culture (A)  Final    STAPHYLOCOCCUS SPECIES (COAGULASE NEGATIVE) THE SIGNIFICANCE OF ISOLATING THIS ORGANISM FROM A SINGLE SET OF BLOOD CULTURES WHEN MULTIPLE SETS ARE DRAWN IS UNCERTAIN. PLEASE NOTIFY THE MICROBIOLOGY DEPARTMENT WITHIN ONE WEEK IF SPECIATION AND SENSITIVITIES ARE REQUIRED. Performed at [facility], [address], [place], [place] [address]    Report Status 11/26/2017 FINAL  Final  Blood Culture ID Panel (Reflexed)     Status: None   Collection Time: 11/22/17  9:30 PM  Result Value Ref Range Status   Enterococcus species NOT DETECTED NOT DETECTED Final    Comment: CRITICAL RESULT CALLED TO, READ BACK BY AND VERIFIED WITH: J LEDFORD PHARMD 11/24/17 0650 JDW    Listeria monocytogenes NOT DETECTED NOT DETECTED Final   Staphylococcus species NOT DETECTED NOT DETECTED Final   Staphylococcus aureus NOT DETECTED NOT DETECTED Final    Streptococcus species NOT DETECTED NOT DETECTED Final   Streptococcus agalactiae NOT DETECTED NOT DETECTED Final   Streptococcus pneumoniae NOT DETECTED NOT DETECTED Final   Streptococcus pyogenes NOT DETECTED NOT DETECTED Final   Acinetobacter baumannii NOT DETECTED NOT DETECTED Final  Enterobacteriaceae species NOT DETECTED NOT DETECTED Final   Enterobacter cloacae complex NOT DETECTED NOT DETECTED Final   Escherichia coli NOT DETECTED NOT DETECTED Final   Klebsiella oxytoca NOT DETECTED NOT DETECTED Final   Klebsiella pneumoniae NOT DETECTED NOT DETECTED Final   Proteus species NOT DETECTED NOT DETECTED Final   Serratia marcescens NOT DETECTED NOT DETECTED Final   Haemophilus influenzae NOT DETECTED NOT DETECTED Final   Neisseria meningitidis NOT DETECTED NOT DETECTED Final   Pseudomonas aeruginosa NOT DETECTED NOT DETECTED Final   Candida albicans NOT DETECTED NOT DETECTED Final   Candida glabrata NOT DETECTED NOT DETECTED Final   Candida krusei NOT DETECTED NOT DETECTED Final   Candida parapsilosis NOT DETECTED NOT DETECTED Final   Candida tropicalis NOT DETECTED NOT DETECTED Final    Comment: Performed at [facility], [address] [place], [place] [address]  Culture, blood (Routine X 2) w Reflex to ID Panel     Status: None   Collection Time: 11/22/17  9:51 PM  Result Value Ref Range Status   Specimen Description BLOOD RIGHT ANTECUBITAL  Final   Special Requests   Final    BOTTLES DRAWN AEROBIC AND ANAEROBIC Blood Culture adequate volume   Culture   Final    NO GROWTH 5 DAYS Performed at [facility], [address] [place], [place] [address]    Report Status 11/27/2017 FINAL  Final  Urine Culture     Status: Abnormal   Collection Time: 11/22/17 11:19 PM  Result Value Ref Range Status   Specimen Description URINE, CATHETERIZED  Final   Special Requests   Final    NONE Performed at [facility], [address] [place], [place] [address]     Culture MULTIPLE SPECIES PRESENT, SUGGEST RECOLLECTION (A)  Final   Report Status 11/24/2017 FINAL  Final  MRSA PCR Screening     Status: None   Collection Time: 11/23/17  4:14 PM  Result Value Ref Range Status   MRSA by PCR NEGATIVE NEGATIVE Final    Comment:        The GeneXpert MRSA Assay (FDA approved for NASAL specimens only), is one component of a comprehensive MRSA colonization surveillance program. It is not intended to diagnose MRSA infection nor to guide or monitor treatment for MRSA infections. Performed at [facility], [address] [place], [place] [address]   Culture, Urine     Status: Abnormal   Collection Time: 11/27/17  8:46 PM  Result Value Ref Range Status   Specimen Description URINE, RANDOM  Final   Special Requests NONE  Final   Culture (A)  Final    <10,000 COLONIES/mL INSIGNIFICANT GROWTH Performed at [facility], [address] [place], [place] [address]    Report Status 11/29/2017 FINAL  Final     Studies: No results found.  Scheduled Meds: . ammonium lactate   Topical q morning - 10a  . aspirin  81 mg Oral Daily  . atorvastatin  80 mg Oral q1800  . famotidine  10 mg Oral Daily  . fenofibrate  54 mg Oral Daily  . furosemide  40 mg Oral Daily  . heparin  5,000 Units Subcutaneous Q8H  . liver oil-zinc oxide   Topical q morning - 10a  . mouth rinse  15 mL Mouth Rinse BID  . midodrine  10 mg Oral TID WC  . multivitamin with minerals  1 tablet Oral Daily  . triamcinolone  2 spray Nasal Daily   Continuous Infusions: . sodium chloride Stopped (11/29/17 0852)  .  ceFAZolin (ANCEF) IV Stopped (11/30/17 0730)  . phenylephrine (NEO-SYNEPHRINE) Adult infusion Stopped (11/29/17 1357)    Active Problems:   Acute renal failure (ARF) (HCC)   Hypotension   Pressure injury of skin   Acute kidney injury (HCC)   Cellulitis of left lower extremity   Sepsis (HCC)   Septic shock (HCC)   Hypovolemic shock (HCC)   Hyperkalemia   Acute  respiratory failure with hypoxia (HCC)    Time spent: >35 minutes     Esperanza Sheets  [facility] Pager [phone]. If 7PM-7AM, please contact night-coverage at [email or web address], password [facility] 11/30/2017, 7:50 AM  LOS: 8 days

## 2017-11-30 NOTE — Progress Notes (Signed)
Physical Therapy Treatment Patient Details Name: Beverly Wheeler MRN: [id number] DOB: 1946-07-13 Today's Date: 11/30/2017    History of Present Illness 72 year old female admitted with weakness, N/V, edema with acute renal failure and lower extremity cellulitis. Pt with hypotension acutely. PMhx: CAD, MI, COPD, HTN     PT Comments    Pt moving well with increased gait tolerance and encouragement for hall ambulation. Pt educated for progression, HEP and continued mobility with nursing.  HR 98 with activity. SpO2 94-97%on 2l with drop to 88% with trial of 1L with gait. BP 126/70    Follow Up Recommendations  SNF;Supervision/Assistance - 24 hour     Equipment Recommendations  Rolling walker with 5" wheels    Recommendations for Other Services       Precautions / Restrictions Precautions Precautions: Fall Restrictions Weight Bearing Restrictions: No    Mobility  Bed Mobility Overal bed mobility: Needs Assistance Bed Mobility: Supine to Sit     Supine to sit: Min assist;HOB elevated     General bed mobility comments: in chair on arrival and end of session  Transfers Overall transfer level: Needs assistance Equipment used: 1 person hand held assist Transfers: Sit to/from Stand Sit to Stand: Min guard         General transfer comment: cues for hand placement  Ambulation/Gait Ambulation/Gait assistance: Min guard Ambulation Distance (Feet): 110 Feet Assistive device: Rolling walker (2 wheeled) Gait Pattern/deviations: Step-through pattern;Decreased stride length;Trunk flexed   Gait velocity interpretation: <1.8 ft/sec, indicate of risk for recurrent falls General Gait Details: cues for safety, turning and stepping into RW. pt veering left throughout with assist to achieve and maintain midline   Stairs             Wheelchair Mobility    Modified Rankin (Stroke Patients Only)       Balance Overall balance assessment: Needs assistance Sitting-balance  support: No upper extremity supported Sitting balance-Leahy Scale: Good     Standing balance support: Single extremity supported Standing balance-Leahy Scale: Poor                              Cognition Arousal/Alertness: Awake/alert Behavior During Therapy: WFL for tasks assessed/performed Overall Cognitive Status: Within Functional Limits for tasks assessed                                        Exercises General Exercises - Lower Extremity Long Arc Quad: Both;AROM;Seated;15 reps Hip Flexion/Marching: AROM;Seated;Both;15 reps    General Comments        Pertinent Vitals/Pain Pain Assessment: Faces Pain Score: 3  Faces Pain Scale: Hurts little more Pain Location: bil LE Pain Descriptors / Indicators: Aching Pain Intervention(s): Limited activity within patient's tolerance;Repositioned    Home Living Family/patient expects to be discharged to:: Skilled nursing facility             Home Equipment: Walker - 2 wheels      Prior Function Level of Independence: Independent with assistive device(s)      Comments: Patient reports limited household ambulation without AD but sometimes using RW. says she has not left the house much since her sister arrived "1-2 years ago". she reports indepndence with ADLs and taking care of her sister with alzheimer's   PT Goals (current goals can now be found in the care plan section) Acute Rehab PT  Goals Patient Stated Goal: to get this fluid off of me Progress towards PT goals: Progressing toward goals    Frequency           PT Plan Current plan remains appropriate    Co-evaluation              AM-PAC PT "6 Clicks" Daily Activity  Outcome Measure  Difficulty turning over in bed (including adjusting bedclothes, sheets and blankets)?: Unable Difficulty moving from lying on back to sitting on the side of the bed? : Unable Difficulty sitting down on and standing up from a chair with arms  (e.g., wheelchair, bedside commode, etc,.)?: A Little Help needed moving to and from a bed to chair (including a wheelchair)?: A Little Help needed walking in hospital room?: A Little Help needed climbing 3-5 steps with a railing? : A Lot 6 Click Score: 13    End of Session Equipment Utilized During Treatment: Gait belt;Oxygen Activity Tolerance: Patient tolerated treatment well Patient left: in chair;with call bell/phone within reach;with chair alarm set Nurse Communication: Mobility status PT Visit Diagnosis: Pain;History of falling (Z91.81);Muscle weakness (generalized) (M62.81);Other abnormalities of gait and mobility (R26.89)     Time: 0962-8366 PT Time Calculation (min) (ACUTE ONLY): 17 min  Charges:  $Gait Training: 8-22 mins                    G Codes:       Delaney Meigs, PT [phone]    Hillery Zachman B Yoshi Vicencio 11/30/2017, 1:11 PM

## 2017-11-30 NOTE — Evaluation (Signed)
Occupational Therapy Evaluation Patient Details Name: Beverly Wheeler MRN: [id number] DOB: 1946/02/04 Today's Date: 11/30/2017    History of Present Illness 72 year old female admitted with weakness, N/V, edema with acute renal failure and lower extremity cellulitis. Pt with hypotension acutely. PMhx: CAD, MI, COPD, HTN    Clinical Impression   This 72 yo female admitted with above presents to acute OT with decreased balance and decreased strength both affecting her safety and independence with basic ADLs. She will benefit from acute OT with follow up OT at SNF to get back to PLOF.  Vitals remained stable except for O2 pt on 2 liters at 96%; off O2 pt dropped to 85% just during bed>3n1>recliner; replaced O2 back on pt at 2 liters.    Follow Up Recommendations  SNF;Supervision/Assistance - 24 hour    Equipment Recommendations  None recommended by OT       Precautions / Restrictions Precautions Precautions: Fall Restrictions Weight Bearing Restrictions: No      Mobility Bed Mobility Overal bed mobility: Needs Assistance Bed Mobility: Supine to Sit     Supine to sit: Min assist;HOB elevated     General bed mobility comments: min A to to fully elevate trunk, used rail  Transfers Overall transfer level: Needs assistance Equipment used: 1 person hand held assist Transfers: Sit to/from Stand Sit to Stand: Min assist         General transfer comment: cues for hand placement and safety    Balance Overall balance assessment: Needs assistance Sitting-balance support: No upper extremity supported Sitting balance-Leahy Scale: Good     Standing balance support: Single extremity supported Standing balance-Leahy Scale: Poor                             ADL either performed or assessed with clinical judgement   ADL Overall ADL's : Needs assistance/impaired Eating/Feeding: Independent Eating/Feeding Details (indicate cue type and reason): supported  sitting Grooming: Set up;Supervision/safety Grooming Details (indicate cue type and reason): supported sitting Upper Body Bathing: Minimal assistance Upper Body Bathing Details (indicate cue type and reason): supported sitting Lower Body Bathing: Maximal assistance Lower Body Bathing Details (indicate cue type and reason): min A sit<>stand Upper Body Dressing : Total assistance;Sitting   Lower Body Dressing: Total assistance Lower Body Dressing Details (indicate cue type and reason): sitting for socks Toilet Transfer: Minimal assistance;Stand-pivot;BSC   Toileting- Clothing Manipulation and Hygiene: Total assistance Toileting - Clothing Manipulation Details (indicate cue type and reason): for back toileting hygiene, min A sit<>stand             Vision Patient Visual Report: No change from baseline              Pertinent Vitals/Pain Pain Assessment: Faces Faces Pain Scale: Hurts little more Pain Location: legs  Pain Descriptors / Indicators: Aching Pain Intervention(s): Limited activity within patient's tolerance;Monitored during session     Hand Dominance Right      Communication Communication Communication: No difficulties   Cognition Arousal/Alertness: Awake/alert Behavior During Therapy: WFL for tasks assessed/performed Overall Cognitive Status: Within Functional Limits for tasks assessed                                                Home Living Family/patient expects to be discharged to:: Skilled nursing facility  Home Equipment: Walker - 2 wheels          Prior Functioning/Environment Level of Independence: Independent with assistive device(s)        Comments: Patient reports limited household ambulation without AD but sometimes using RW. says she has not left the house much since her sister arrived "1-2 years ago". she reports indepndence with ADLs and taking care of her sister with  alzheimer's        OT Problem List: Decreased strength;Decreased range of motion;Impaired balance (sitting and/or standing);Pain;Obesity      OT Treatment/Interventions: Self-care/ADL training;Balance training;DME and/or AE instruction;Patient/family education    OT Goals(Current goals can be found in the care plan section) Acute Rehab OT Goals Patient Stated Goal: to get this fluid off of me  OT Frequency: Min 2X/week              AM-PAC PT "6 Clicks" Daily Activity     Outcome Measure Help from another person eating meals?: None Help from another person taking care of personal grooming?: A Little Help from another person toileting, which includes using toliet, bedpan, or urinal?: A Lot Help from another person bathing (including washing, rinsing, drying)?: A Lot Help from another person to put on and taking off regular upper body clothing?: A Little Help from another person to put on and taking off regular lower body clothing?: Total 6 Click Score: 15   End of Session Equipment Utilized During Treatment: Gait belt Nurse Communication: Mobility status  Activity Tolerance: Other (comment)(limited by loose stool) Patient left: in chair;with call bell/phone within reach;with chair alarm set  OT Visit Diagnosis: Unsteadiness on feet (R26.81);Pain Pain - part of body: (legs)                Time: 7282-0601 OT Time Calculation (min): 26 min Charges:  OT General Charges $OT Visit: 1 Visit OT Evaluation $OT Eval Moderate Complexity: 1 Mod OT Treatments $Self Care/Home Management : 8-22 mins Ignacia Palma, OTR/L [phone] 11/30/2017

## 2017-12-01 LAB — BASIC METABOLIC PANEL
ANION GAP: 6 (ref 5–15)
BUN: 8 mg/dL (ref 6–20)
CO2: 31 mmol/L (ref 22–32)
Calcium: 8.3 mg/dL — ABNORMAL LOW (ref 8.9–10.3)
Chloride: 105 mmol/L (ref 101–111)
Creatinine, Ser: 1.08 mg/dL — ABNORMAL HIGH (ref 0.44–1.00)
GFR, EST AFRICAN AMERICAN: 59 mL/min — AB (ref >60)
GFR, EST NON AFRICAN AMERICAN: 51 mL/min — AB (ref >60)
Glucose, Bld: 94 mg/dL (ref 65–99)
POTASSIUM: 3.9 mmol/L (ref 3.5–5.1)
SODIUM: 142 mmol/L (ref 135–145)

## 2017-12-01 MED ORDER — ALBUTEROL SULFATE (2.5 MG/3ML) 0.083% IN NEBU
2.5000 mg | INHALATION_SOLUTION | RESPIRATORY_TRACT | Status: DC | PRN
  Filled 2017-12-01: qty 3

## 2017-12-01 MED ORDER — HYDROCODONE-ACETAMINOPHEN 5-325 MG PO TABS
1.0000 | ORAL_TABLET | ORAL | Status: DC | PRN
  Administered 2017-12-05: 1 via ORAL
  Filled 2017-12-01: qty 1

## 2017-12-01 MED ORDER — LORATADINE 10 MG PO TABS
10.0000 mg | ORAL_TABLET | Freq: Every day | ORAL | Status: DC
  Administered 2017-12-01 – 2017-12-05 (×5): 10 mg via ORAL
  Filled 2017-12-01 (×5): qty 1

## 2017-12-01 MED ORDER — FOLIC ACID 1 MG PO TABS
1.0000 mg | ORAL_TABLET | Freq: Every day | ORAL | Status: DC
  Administered 2017-12-03 – 2017-12-05 (×3): 1 mg via ORAL
  Filled 2017-12-01 (×3): qty 1

## 2017-12-01 MED ORDER — CYANOCOBALAMIN 1000 MCG/ML IJ SOLN
1000.0000 ug | Freq: Every day | INTRAMUSCULAR | Status: AC
  Administered 2017-12-01 – 2017-12-03 (×3): 1000 ug via SUBCUTANEOUS
  Filled 2017-12-01 (×3): qty 1

## 2017-12-01 MED ORDER — GUAIFENESIN ER 600 MG PO TB12: 600.0000 mg | ORAL_TABLET | Freq: Two times a day (BID) | ORAL | Status: DC | PRN

## 2017-12-01 MED ORDER — FOLIC ACID 5 MG/ML IJ SOLN
1.0000 mg | Freq: Every day | INTRAMUSCULAR | Status: AC
  Administered 2017-12-01 – 2017-12-02 (×2): 1 mg via INTRAVENOUS
  Filled 2017-12-01 (×2): qty 0.2

## 2017-12-01 MED ORDER — FERROUS GLUCONATE 324 (38 FE) MG PO TABS
324.0000 mg | ORAL_TABLET | Freq: Two times a day (BID) | ORAL | Status: DC
  Administered 2017-12-01 – 2017-12-05 (×9): 324 mg via ORAL
  Filled 2017-12-01 (×9): qty 1

## 2017-12-01 MED ORDER — VITAMIN B-12 1000 MCG PO TABS
1000.0000 ug | ORAL_TABLET | Freq: Every day | ORAL | Status: DC
  Administered 2017-12-04 – 2017-12-05 (×2): 1000 ug via ORAL
  Filled 2017-12-01 (×2): qty 1

## 2017-12-01 MED ORDER — FUROSEMIDE 40 MG PO TABS
40.0000 mg | ORAL_TABLET | Freq: Two times a day (BID) | ORAL | Status: DC
  Administered 2017-12-01 – 2017-12-05 (×9): 40 mg via ORAL
  Filled 2017-12-01 (×9): qty 1

## 2017-12-01 NOTE — Progress Notes (Signed)
[facility] TEAM 1 - Stepdown/ICU TEAM  Joanette Silveria  UJW:[id number] DOB: May 26, 1946 DOA: 11/22/2017 PCP: Patient, No Pcp Per    Brief Narrative:  72 year old female former smoker (quit 4 years ago) with history of CAD with previous MI, COPD, and hypertension who presented 5/1 with a 1 week history of weakness, nausea vomiting, right greater than left lower extremity swelling with weeping ulcers.  In the ED she was noted to be in acute renal failure with creatinine of 5, and hyperkalemic with a potassium of 6.7.  She was admitted by the Hospitalist with acute renal failure and lower extremity cellulitis.  She had persistent hypotension despite 4.5L fluids with systolic blood pressure in the 60s.  PCCM was therefore consulted for ICU admission.  Significant Events: 5/1 admit 5/3 TTE - EF 35-40% - mild MR - PA peak 5/7 TRH resumed care   Subjective: Patient is alert and oriented sitting up in a bedside chair.  She denies shortness of breath fevers chills nausea or vomiting.  She does report some early morning congestion.  She admits that she is very weak and is willing to pursue SNF placement for a rehab stay.  Assessment & Plan:  Hypotension - Septic shock  Due to sepsis and hypovolemia - cortisol appropriately elevated - cont high dose midodrine - off neo for 48hrs - BP stable    AKI Due to dehydration, hypotension - crt 5.08 at admission - renal fxn has stabilized  Recent Labs  Lab 11/27/17 0623 11/28/17 0326 11/29/17 0248 11/30/17 0255 12/01/17 0228  CREATININE 1.14* 1.28* 1.16* 1.03* 1.08*    Newly diagnosed acutely decompensated Systolic CHF - EF 35-40% No WMA on TTE - mild peripheral edema of arms and legs at present, suggestive of low oncotic state - now on oral diuretic and BP is tolerating - w/ climbing weight will increase lasix and follow crt/BP  Filed Weights   11/27/17 0400 11/29/17 0630 11/30/17 0600  Weight: 85.2 kg (187 lb 13.3 oz) 85.8 kg (189 lb 1.6 oz)  86.7 kg (191 lb 2.2 oz)    CAD ASA - asymptomatic - no focal WMA on TTE this admit   Acute hypoxemic resp failure - Acute pulm edema resolved   B LE Cellulitis suspect underlying PVD - will need outpt eval once renal fxn fully recovered   Normocytic anemia  likely due to kidney disease - Hgb stable - Fe studies c/w borderline Fe deficiency and ACKD - supplement w/ oral Fe   Folate deficiency Load w/ IV folate then begin daily oral folate replacement  Borderline B12 Supplement w/ SQ B12 x3 days then begin oral supplementation   DVT prophylaxis: SQ heparin  Code Status: FULL CODE Family Communication: no family present at time of exam  Disposition Plan: transfer to med/surg bed - cont PT/OT - await SNF bed - titrate diuretic - follow renal fxn and BP   Consultants:  PCCM  Antimicrobials:  Vancomycin 5/1 > 5/6 Zosyn 5/2 > 5/6  Objective: Blood pressure (!) 97/48, pulse 68, temperature 99 F (37.2 C), temperature source Oral, resp. rate (!) 27, height 5\' 1"  (1.549 m), weight 86.7 kg (191 lb 2.2 oz), SpO2 99 %.  Intake/Output Summary (Last 24 hours) at 12/01/2017 0947 Last data filed at 12/01/2017 0930 Gross per 24 hour  Intake 290 ml  Output 751 ml  Net -461 ml   Filed Weights   11/27/17 0400 11/29/17 0630 11/30/17 0600  Weight: 85.2 kg (187 lb 13.3 oz) 85.8  kg (189 lb 1.6 oz) 86.7 kg (191 lb 2.2 oz)    Examination: General: No acute respiratory distress - A&O Lungs: CTA B - no wheezing  Cardiovascular: RRR -  No M or rub  Abdomen: NT/ND, soft, bs+, no mass - no rebound  Extremities: B LE wrapped - 1+ edema of arms and legs diffussely   CBC: Recent Labs  Lab 11/25/17 0238 11/26/17 0208 11/27/17 0235  11/28/17 0326 11/29/17 0248 11/30/17 0255  WBC 9.4 12.8* 6.7   < > 9.7 7.2 8.1  NEUTROABS 6.9 10.9* 5.3  --   --   --   --   HGB 10.1* 10.4* 7.3*   < > 9.0* 8.8* 8.7*  HCT 31.7* 33.5* 23.5*   < > 29.5* 29.0* 28.5*  MCV 88.5 91.3 92.2   < > 92.5 92.9 92.8    PLT 168 248 169   < > 241 227 205   < > = values in this interval not displayed.   Basic Metabolic Panel: Recent Labs  Lab 11/25/17 0238  11/28/17 1200 11/29/17 0248 11/30/17 0255 12/01/17 0228  NA 138   < >  --  143 142 142  K 5.3*   < >  --  3.9 4.0 3.9  CL 114*   < >  --  110 109 105  CO2 19*   < >  --  28 27 31   GLUCOSE 103*   < >  --  114* 96 94  BUN 51*   < >  --  14 10 8   CREATININE 1.50*   < >  --  1.16* 1.03* 1.08*  CALCIUM 8.2*   < >  --  8.2* 8.3* 8.3*  MG 1.7  --  1.4* 2.1  --   --   PHOS 4.4  --   --   --   --   --    < > = values in this interval not displayed.   GFR: Estimated Creatinine Clearance: 49.2 mL/min (A) (by C-G formula based on SCr of 1.08 mg/dL (H)).  Liver Function Tests: Recent Labs  Lab 11/29/17 0248  AST 22  ALT 10*  ALKPHOS 43  BILITOT 0.3  PROT 5.1*  ALBUMIN 2.2*    HbA1C: Hgb A1c MFr Bld  Date/Time Value Ref Range Status  12/11/2009 07:55 AM 6.3 4.6 - 6.5 % Final    Comment:    See lab report for associated comment(s)    CBG: Recent Labs  Lab 11/25/17 2118  GLUCAP 109*    Recent Results (from the past 240 hour(s))  Culture, blood (Routine X 2) w Reflex to ID Panel     Status: Abnormal   Collection Time: 11/22/17  9:30 PM  Result Value Ref Range Status   Specimen Description BLOOD LEFT ARM  Final   Special Requests   Final    BOTTLES DRAWN AEROBIC AND ANAEROBIC Blood Culture adequate volume   Culture  Setup Time   Final    ANAEROBIC BOTTLE ONLY GRAM POSITIVE COCCI CRITICAL RESULT CALLED TO, READ BACK BY AND VERIFIED WITH: J LEDFORD PHARMD 11/24/17 0650 JDW    Culture (A)  Final    STAPHYLOCOCCUS SPECIES (COAGULASE NEGATIVE) THE SIGNIFICANCE OF ISOLATING THIS ORGANISM FROM A SINGLE SET OF BLOOD CULTURES WHEN MULTIPLE SETS ARE DRAWN IS UNCERTAIN. PLEASE NOTIFY THE MICROBIOLOGY DEPARTMENT WITHIN ONE WEEK IF SPECIATION AND SENSITIVITIES ARE REQUIRED. Performed at [facility] Lab, [address] [place], [place]  [address]  Report Status 11/26/2017 FINAL  Final  Blood Culture ID Panel (Reflexed)     Status: None   Collection Time: 11/22/17  9:30 PM  Result Value Ref Range Status   Enterococcus species NOT DETECTED NOT DETECTED Final    Comment: CRITICAL RESULT CALLED TO, READ BACK BY AND VERIFIED WITH: J LEDFORD PHARMD 11/24/17 0650 JDW    Listeria monocytogenes NOT DETECTED NOT DETECTED Final   Staphylococcus species NOT DETECTED NOT DETECTED Final   Staphylococcus aureus NOT DETECTED NOT DETECTED Final   Streptococcus species NOT DETECTED NOT DETECTED Final   Streptococcus agalactiae NOT DETECTED NOT DETECTED Final   Streptococcus pneumoniae NOT DETECTED NOT DETECTED Final   Streptococcus pyogenes NOT DETECTED NOT DETECTED Final   Acinetobacter baumannii NOT DETECTED NOT DETECTED Final   Enterobacteriaceae species NOT DETECTED NOT DETECTED Final   Enterobacter cloacae complex NOT DETECTED NOT DETECTED Final   Escherichia coli NOT DETECTED NOT DETECTED Final   Klebsiella oxytoca NOT DETECTED NOT DETECTED Final   Klebsiella pneumoniae NOT DETECTED NOT DETECTED Final   Proteus species NOT DETECTED NOT DETECTED Final   Serratia marcescens NOT DETECTED NOT DETECTED Final   Haemophilus influenzae NOT DETECTED NOT DETECTED Final   Neisseria meningitidis NOT DETECTED NOT DETECTED Final   Pseudomonas aeruginosa NOT DETECTED NOT DETECTED Final   Candida albicans NOT DETECTED NOT DETECTED Final   Candida glabrata NOT DETECTED NOT DETECTED Final   Candida krusei NOT DETECTED NOT DETECTED Final   Candida parapsilosis NOT DETECTED NOT DETECTED Final   Candida tropicalis NOT DETECTED NOT DETECTED Final    Comment: Performed at [facility] Lab, [address], [place], [place] [address]  Culture, blood (Routine X 2) w Reflex to ID Panel     Status: None   Collection Time: 11/22/17  9:51 PM  Result Value Ref Range Status   Specimen Description BLOOD RIGHT ANTECUBITAL  Final   Special Requests   Final     BOTTLES DRAWN AEROBIC AND ANAEROBIC Blood Culture adequate volume   Culture   Final    NO GROWTH 5 DAYS Performed at [facility], [address], [place], [place] [address]    Report Status 11/27/2017 FINAL  Final  Urine Culture     Status: Abnormal   Collection Time: 11/22/17 11:19 PM  Result Value Ref Range Status   Specimen Description URINE, CATHETERIZED  Final   Special Requests   Final    NONE Performed at [facility], [address], [place], [place] [address]    Culture MULTIPLE SPECIES PRESENT, SUGGEST RECOLLECTION (A)  Final   Report Status 11/24/2017 FINAL  Final  MRSA PCR Screening     Status: None   Collection Time: 11/23/17  4:14 PM  Result Value Ref Range Status   MRSA by PCR NEGATIVE NEGATIVE Final    Comment:        The GeneXpert MRSA Assay (FDA approved for NASAL specimens only), is one component of a comprehensive MRSA colonization surveillance program. It is not intended to diagnose MRSA infection nor to guide or monitor treatment for MRSA infections. Performed at [facility], [address], [place], [place] [address]   Culture, Urine     Status: Abnormal   Collection Time: 11/27/17  8:46 PM  Result Value Ref Range Status   Specimen Description URINE, RANDOM  Final   Special Requests NONE  Final   Culture (A)  Final    <10,000 COLONIES/mL INSIGNIFICANT  GROWTH Performed at [facility], [address], [place], [place] [address]    Report Status 11/29/2017 FINAL  Final     Scheduled Meds: . ammonium lactate   Topical q morning - 10a  . aspirin  81 mg Oral Daily  . atorvastatin  80 mg Oral q1800  . famotidine  10 mg Oral Daily  . fenofibrate  54 mg Oral Daily  . furosemide  40 mg Oral Daily  . heparin  5,000 Units Subcutaneous Q8H  . liver oil-zinc oxide   Topical q morning - 10a  . mouth rinse  15 mL Mouth Rinse BID  . midodrine  10 mg Oral TID WC  . multivitamin with minerals  1 tablet Oral Daily  . triamcinolone   2 spray Nasal Daily     LOS: 9 days   Lonia Blood, MD [facility] Office  [phone] Pager - Text Page per Amion as per below:  On-Call/Text Page:      [email or web address]      password [email or web address]  If 7PM-7AM, please contact night-coverage [email or web address] Password [facility] 12/01/2017, 9:47 AM

## 2017-12-01 NOTE — Plan of Care (Signed)
Patient sitting in chair, assisted to bedside commode without any difficulty.  Denies any shortness of breath, pain or discomfort.  Complains of acid reflux.  Ambulated to beside commode without difficulty.  Monitoring.

## 2017-12-01 NOTE — Plan of Care (Signed)
Patient denies shortness of breath or discomfort at this time.  Up to the bedside commode with assist of one.Continues to improve in ADLs.  Alert and oriented, complaint of indigestion, stating that she hasn't had much of an appetite, but had eaten spaghetti earlier today.  Given zofran as charted.  Monitoring.

## 2017-12-02 ENCOUNTER — Inpatient Hospital Stay (HOSPITAL_COMMUNITY): Payer: Medicare Other

## 2017-12-02 ENCOUNTER — Other Ambulatory Visit: Payer: Self-pay | Admitting: Cardiovascular Disease

## 2017-12-02 DIAGNOSIS — R0602 Shortness of breath: Secondary | ICD-10-CM

## 2017-12-02 LAB — BASIC METABOLIC PANEL
ANION GAP: 8 (ref 5–15)
Anion gap: 9 (ref 5–15)
BUN: 7 mg/dL (ref 6–20)
BUN: 7 mg/dL (ref 6–20)
CALCIUM: 8.4 mg/dL — AB (ref 8.9–10.3)
CO2: 34 mmol/L — ABNORMAL HIGH (ref 22–32)
CO2: 34 mmol/L — ABNORMAL HIGH (ref 22–32)
Calcium: 8.4 mg/dL — ABNORMAL LOW (ref 8.9–10.3)
Chloride: 102 mmol/L (ref 101–111)
Chloride: 101 mmol/L (ref 101–111)
Creatinine, Ser: 1.16 mg/dL — ABNORMAL HIGH (ref 0.44–1.00)
Creatinine, Ser: 1.18 mg/dL — ABNORMAL HIGH (ref 0.44–1.00)
GFR calc Af Amer: 54 mL/min — ABNORMAL LOW (ref >60)
GFR calc Af Amer: 53 mL/min — ABNORMAL LOW (ref >60)
GFR calc non Af Amer: 46 mL/min — ABNORMAL LOW (ref >60)
GFR, EST NON AFRICAN AMERICAN: 47 mL/min — AB (ref >60)
Glucose, Bld: 89 mg/dL (ref 65–99)
Glucose, Bld: 101 mg/dL — ABNORMAL HIGH (ref 65–99)
POTASSIUM: 3.5 mmol/L (ref 3.5–5.1)
Potassium: 3.6 mmol/L (ref 3.5–5.1)
SODIUM: 144 mmol/L (ref 135–145)
Sodium: 144 mmol/L (ref 135–145)

## 2017-12-02 LAB — CBC
HCT: 29.5 % — ABNORMAL LOW (ref 36.0–46.0)
Hemoglobin: 8.8 g/dL — ABNORMAL LOW (ref 12.0–15.0)
MCH: 28.8 pg (ref 26.0–34.0)
MCHC: 29.8 g/dL — ABNORMAL LOW (ref 30.0–36.0)
MCV: 96.4 fL (ref 78.0–100.0)
Platelets: 220 10*3/uL (ref 150–400)
RBC: 3.06 MIL/uL — ABNORMAL LOW (ref 3.87–5.11)
RDW: 17.9 % — ABNORMAL HIGH (ref 11.5–15.5)
WBC: 7.9 10*3/uL (ref 4.0–10.5)

## 2017-12-02 NOTE — Progress Notes (Signed)
[facility] TEAM 1 - Stepdown/ICU TEAM  Beverly Wheeler  ZOX:[id number] DOB: 1946-02-24 DOA: 11/22/2017 PCP: Patient, No Pcp Per    Brief Narrative:  72 year old female former smoker (quit 4 years ago) with history of CAD with previous MI, COPD, and hypertension who presented 5/1 with a 1 week history of weakness, nausea vomiting, right greater than left lower extremity swelling with weeping ulcers.  In the ED she was noted to be in acute renal failure with creatinine of 5, and hyperkalemic with a potassium of 6.7.  She was admitted by the Hospitalist with acute renal failure and lower extremity cellulitis.  She had persistent hypotension despite 4.5L fluids with systolic blood pressure in the 60s.  PCCM was therefore consulted for ICU admission.  Significant Events: 5/1 admit 5/3 TTE - EF 35-40% - mild MR - PA peak 5/7 TRH resumed care   Subjective:  Patient in bed, appears comfortable, denies any headache, no fever, no chest pain or pressure, no shortness of breath , no abdominal pain. No focal weakness.   Assessment & Plan:  Hypotension combination of septic shock and hypovolemia , sepsis caused by bilateral lower extremity cellulitis.  She has been treated with empiric antibiotics, sepsis physiology has resolved, all blood cultures negative, clinically appears nontoxic, blood pressures have stabilized with addition of midodrine.  Cortisol levels were appropriately elevated.  Advance activity and monitor.  ARF -  Due to above, resolved after IV fluids.   Newly diagnosed acutely decompensated Systolic CHF - EF 35-40% - due to low blood pressure and ARF cannot do Coreg or ACE/ARB, now close to being compensated, has some peripheral edema which is improving with low-dose twice daily Lasix and Unna boots.  Continue titrating off oxygen.  Letter weight, intake output and electrolytes.  Filed Weights   11/29/17 0630 11/30/17 0600 12/01/17 1953  Weight: 85.8 kg (189 lb 1.6 oz) 86.7 kg (191 lb  2.2 oz) 86.5 kg (190 lb 11.2 oz)    CAD - ASA - asymptomatic - no focal WMA on TTE this admit   Acute hypoxemic resp failure - Acute pulm edema, resolved   B LE Cellulitis- suspect underlying PVD - will need outpt eval once renal fxn fully recovered   Normocytic anemia  - likely due to kidney disease - Hgb stable - Fe studies c/w borderline Fe deficiency and ACKD - supplement w/ oral Fe   Folate deficiency - Load w/ IV folate then begin daily oral folate replacement  Borderline B12 - Supplement w/ SQ B12 x3 days then begin oral supplementation     DVT prophylaxis: SQ heparin  Code Status: FULL CODE Family Communication: no family present at time of exam  Disposition Plan: transfer to med/surg bed - cont PT/OT - await SNF bed - titrate diuretic - follow renal fxn and BP   Consultants:  PCCM  Antimicrobials:  Vancomycin 5/1 > 5/6 Zosyn 5/2 > 5/6  Objective: Blood pressure (!) 113/41, pulse 75, temperature 99.6 F (37.6 C), temperature source Oral, resp. rate 18, height 5\' 1"  (1.549 m), weight 86.5 kg (190 lb 11.2 oz), SpO2 96 %.  Intake/Output Summary (Last 24 hours) at 12/02/2017 1213 Last data filed at 12/02/2017 0600 Gross per 24 hour  Intake 540 ml  Output 1100 ml  Net -560 ml   Filed Weights   11/29/17 0630 11/30/17 0600 12/01/17 1953  Weight: 85.8 kg (189 lb 1.6 oz) 86.7 kg (191 lb 2.2 oz) 86.5 kg (190 lb 11.2 oz)  Examination:  Awake Alert, Oriented X 3, No new F.N deficits, Normal affect [place].AT,PERRAL Supple Neck,No JVD, No cervical lymphadenopathy appriciated.  Symmetrical Chest wall movement, Good air movement bilaterally, CTAB RRR,No Gallops, Rubs or new Murmurs, No Parasternal Heave +ve B.Sounds, Abd Soft, No tenderness, No organomegaly appriciated, No rebound - guarding or rigidity. No Cyanosis, Clubbing or edema, No new Rash or bruise   CBC: Recent Labs  Lab 11/26/17 0208 11/27/17 0235  11/29/17 0248 11/30/17 0255 12/02/17 0558  WBC 12.8*  6.7   < > 7.2 8.1 7.9  NEUTROABS 10.9* 5.3  --   --   --   --   HGB 10.4* 7.3*   < > 8.8* 8.7* 8.8*  HCT 33.5* 23.5*   < > 29.0* 28.5* 29.5*  MCV 91.3 92.2   < > 92.9 92.8 96.4  PLT 248 169   < > 227 205 220   < > = values in this interval not displayed.   Basic Metabolic Panel: Recent Labs  Lab 11/28/17 1200 11/29/17 0248  12/01/17 0228 12/02/17 0312 12/02/17 0558  NA  --  143   < > 142 144 144  K  --  3.9   < > 3.9 3.5 3.6  CL  --  110   < > 105 102 101  CO2  --  28   < > 31 34* 34*  GLUCOSE  --  114*   < > 94 89 101*  BUN  --  14   < > 8 7 7   CREATININE  --  1.16*   < > 1.08* 1.16* 1.18*  CALCIUM  --  8.2*   < > 8.3* 8.4* 8.4*  MG 1.4* 2.1  --   --   --   --    < > = values in this interval not displayed.   GFR: Estimated Creatinine Clearance: 45 mL/min (A) (by C-G formula based on SCr of 1.18 mg/dL (H)).  Liver Function Tests: Recent Labs  Lab 11/29/17 0248  AST 22  ALT 10*  ALKPHOS 43  BILITOT 0.3  PROT 5.1*  ALBUMIN 2.2*    HbA1C: Hgb A1c MFr Bld  Date/Time Value Ref Range Status  12/11/2009 07:55 AM 6.3 4.6 - 6.5 % Final    Comment:    See lab report for associated comment(s)    CBG: Recent Labs  Lab 11/25/17 2118  GLUCAP 109*    Recent Results (from the past 240 hour(s))  Culture, blood (Routine X 2) w Reflex to ID Panel     Status: Abnormal   Collection Time: 11/22/17  9:30 PM  Result Value Ref Range Status   Specimen Description BLOOD LEFT ARM  Final   Special Requests   Final    BOTTLES DRAWN AEROBIC AND ANAEROBIC Blood Culture adequate volume   Culture  Setup Time   Final    ANAEROBIC BOTTLE ONLY GRAM POSITIVE COCCI CRITICAL RESULT CALLED TO, READ BACK BY AND VERIFIED WITH: J LEDFORD PHARMD 11/24/17 0650 JDW    Culture (A)  Final    STAPHYLOCOCCUS SPECIES (COAGULASE NEGATIVE) THE SIGNIFICANCE OF ISOLATING THIS ORGANISM FROM A SINGLE SET OF BLOOD CULTURES WHEN MULTIPLE SETS ARE DRAWN IS UNCERTAIN. PLEASE NOTIFY THE MICROBIOLOGY DEPARTMENT  WITHIN ONE WEEK IF SPECIATION AND SENSITIVITIES ARE REQUIRED. Performed at [facility], [address], [place], [place] [address]    Report Status 11/26/2017 FINAL  Final  Blood Culture ID Panel (Reflexed)     Status: None  Collection Time: 11/22/17  9:30 PM  Result Value Ref Range Status   Enterococcus species NOT DETECTED NOT DETECTED Final    Comment: CRITICAL RESULT CALLED TO, READ BACK BY AND VERIFIED WITH: J LEDFORD PHARMD 11/24/17 0650 JDW    Listeria monocytogenes NOT DETECTED NOT DETECTED Final   Staphylococcus species NOT DETECTED NOT DETECTED Final   Staphylococcus aureus NOT DETECTED NOT DETECTED Final   Streptococcus species NOT DETECTED NOT DETECTED Final   Streptococcus agalactiae NOT DETECTED NOT DETECTED Final   Streptococcus pneumoniae NOT DETECTED NOT DETECTED Final   Streptococcus pyogenes NOT DETECTED NOT DETECTED Final   Acinetobacter baumannii NOT DETECTED NOT DETECTED Final   Enterobacteriaceae species NOT DETECTED NOT DETECTED Final   Enterobacter cloacae complex NOT DETECTED NOT DETECTED Final   Escherichia coli NOT DETECTED NOT DETECTED Final   Klebsiella oxytoca NOT DETECTED NOT DETECTED Final   Klebsiella pneumoniae NOT DETECTED NOT DETECTED Final   Proteus species NOT DETECTED NOT DETECTED Final   Serratia marcescens NOT DETECTED NOT DETECTED Final   Haemophilus influenzae NOT DETECTED NOT DETECTED Final   Neisseria meningitidis NOT DETECTED NOT DETECTED Final   Pseudomonas aeruginosa NOT DETECTED NOT DETECTED Final   Candida albicans NOT DETECTED NOT DETECTED Final   Candida glabrata NOT DETECTED NOT DETECTED Final   Candida krusei NOT DETECTED NOT DETECTED Final   Candida parapsilosis NOT DETECTED NOT DETECTED Final   Candida tropicalis NOT DETECTED NOT DETECTED Final    Comment: Performed at [facility], [address], [place], [place] [address]  Culture, blood (Routine X 2) w Reflex to ID Panel     Status: None   Collection Time:  11/22/17  9:51 PM  Result Value Ref Range Status   Specimen Description BLOOD RIGHT ANTECUBITAL  Final   Special Requests   Final    BOTTLES DRAWN AEROBIC AND ANAEROBIC Blood Culture adequate volume   Culture   Final    NO GROWTH 5 DAYS Performed at [facility], [address] [place], [place] [address]    Report Status 11/27/2017 FINAL  Final  Urine Culture     Status: Abnormal   Collection Time: 11/22/17 11:19 PM  Result Value Ref Range Status   Specimen Description URINE, CATHETERIZED  Final   Special Requests   Final    NONE Performed at [facility], [address], [place], [place] [address]    Culture MULTIPLE SPECIES PRESENT, SUGGEST RECOLLECTION (A)  Final   Report Status 11/24/2017 FINAL  Final  MRSA PCR Screening     Status: None   Collection Time: 11/23/17  4:14 PM  Result Value Ref Range Status   MRSA by PCR NEGATIVE NEGATIVE Final    Comment:        The GeneXpert MRSA Assay (FDA approved for NASAL specimens only), is one component of a comprehensive MRSA colonization surveillance program. It is not intended to diagnose MRSA infection nor to guide or monitor treatment for MRSA infections. Performed at [facility], [address] [place], [place] [address]   Culture, Urine     Status: Abnormal   Collection Time: 11/27/17  8:46 PM  Result Value Ref Range Status   Specimen Description URINE, RANDOM  Final   Special Requests NONE  Final   Culture (A)  Final    <10,000 COLONIES/mL INSIGNIFICANT GROWTH Performed at [facility], [address], [place], [place] [address]    Report Status 11/29/2017  FINAL  Final     Scheduled Meds: . ammonium lactate   Topical q morning - 10a  . aspirin  81 mg Oral Daily  . atorvastatin  80 mg Oral q1800  . cyanocobalamin  1,000 mcg Subcutaneous Daily  . famotidine  10 mg Oral Daily  . fenofibrate  54 mg Oral Daily  . ferrous gluconate  324 mg Oral BID WC  . [START ON 12/03/2017] folic acid  1  mg Oral Daily  . furosemide  40 mg Oral BID  . heparin  5,000 Units Subcutaneous Q8H  . liver oil-zinc oxide   Topical q morning - 10a  . loratadine  10 mg Oral Daily  . mouth rinse  15 mL Mouth Rinse BID  . midodrine  10 mg Oral TID WC  . multivitamin with minerals  1 tablet Oral Daily  . triamcinolone  2 spray Nasal Daily  . [START ON 12/04/2017] vitamin B-12  1,000 mcg Oral Daily     LOS: 10 days   Signature  Susa Raring M.D on 12/02/2017 at 12:13 PM  Between 7am to 7pm - Pager - [phone] ( page via [email or web address], text pages only, please mention full 10 digit call back number).  After 7pm go to [email or web address] - password [facility]

## 2017-12-02 NOTE — Progress Notes (Signed)
At approximately 0446, patient's O2 level was ranging between 62% and 82% on 3 LPM [place].  Patient was complaining of indigestion.  Maalox was given with resolution of indigestion.  Patient speaking in complete symptoms and no obvious signs of respiratory distress.  O2 bumped up to 6 LPM with minimal improvement in O2   There was expiratory wheezes in the LUL.  Respiratory called to assess and before she could arrive, O2 levels began to improve.  Able to wean patient back to 3 LPM [place] and levels staying at 97 - 99%.  MD called and made aware.  Orders received.  Will continue to monitor patient.  Bernie Covey RN-BC, [employer]

## 2017-12-02 NOTE — Progress Notes (Signed)
SATURATION QUALIFICATIONS: (This note is used to comply with regulatory documentation for home oxygen)  Patient Saturations on Room Air at Rest = 88%  Patient Saturations on Room Air while Ambulating = Didn't ambulate patient   Patient Saturations on 1Liters of oxygen while at rest 92%

## 2017-12-02 NOTE — Progress Notes (Signed)
Received call from RN to assess pt for increased O2 demand, wheeze. Upon RT arrival, RN had been able to wean O2 down to 3L [place] with SpO2 of 99%. Pt denies SOB at this time, and breath sounds were clear/diminished bilaterally. Nail beds were noted to be more white, so RN was contacting MD for orders. RT will continue to monitor.

## 2017-12-03 LAB — BASIC METABOLIC PANEL
ANION GAP: 6 (ref 5–15)
BUN: 7 mg/dL (ref 6–20)
CALCIUM: 8.2 mg/dL — AB (ref 8.9–10.3)
CO2: 40 mmol/L — ABNORMAL HIGH (ref 22–32)
Chloride: 98 mmol/L — ABNORMAL LOW (ref 101–111)
Creatinine, Ser: 1.22 mg/dL — ABNORMAL HIGH (ref 0.44–1.00)
GFR calc Af Amer: 51 mL/min — ABNORMAL LOW (ref >60)
GFR, EST NON AFRICAN AMERICAN: 44 mL/min — AB (ref >60)
Glucose, Bld: 84 mg/dL (ref 65–99)
Potassium: 3.4 mmol/L — ABNORMAL LOW (ref 3.5–5.1)
Sodium: 144 mmol/L (ref 135–145)

## 2017-12-03 LAB — MAGNESIUM: MAGNESIUM: 1.8 mg/dL (ref 1.7–2.4)

## 2017-12-03 MED ORDER — POTASSIUM CHLORIDE CRYS ER 20 MEQ PO TBCR
40.0000 meq | EXTENDED_RELEASE_TABLET | Freq: Once | ORAL | Status: AC
  Administered 2017-12-03: 40 meq via ORAL
  Filled 2017-12-03: qty 2

## 2017-12-03 MED ORDER — MAGNESIUM SULFATE IN D5W 1-5 GM/100ML-% IV SOLN
1.0000 g | Freq: Once | INTRAVENOUS | Status: AC
  Administered 2017-12-03: 1 g via INTRAVENOUS
  Filled 2017-12-03: qty 100

## 2017-12-03 NOTE — Progress Notes (Signed)
[place] TEAM 1 - Stepdown/ICU TEAM  Beverly Wheeler  AVW:[id number] DOB: [birth year] DOA: 11/22/2017 PCP: Patient, No Pcp Per    Brief Narrative:  72 year old female former smoker (quit 4 years ago) with history of CAD with previous MI, COPD, and hypertension who presented 5/1 with a 1 week history of weakness, nausea vomiting, right greater than left lower extremity swelling with weeping ulcers.  In the ED she was noted to be in acute renal failure with creatinine of 5, and hyperkalemic with a potassium of 6.7.  She was admitted by the Hospitalist with acute renal failure and lower extremity cellulitis.  She had persistent hypotension despite 4.5L fluids with systolic blood pressure in the 60s.  PCCM was therefore consulted for ICU admission.  Significant Events: 5/1 admit 5/3 TTE - EF 35-40% - mild MR - PA peak 5/7 TRH resumed care   Subjective:  Patient in bed, appears comfortable, denies any headache, no fever, no chest pain or pressure, no shortness of breath , no abdominal pain. No focal weakness.   Assessment & Plan:  Hypotension combination of septic shock and hypovolemia , sepsis caused by bilateral lower extremity cellulitis.  She has been treated with empiric antibiotics, sepsis physiology has resolved, all blood cultures negative, clinically appears nontoxic, blood pressures have stabilized with addition of midodrine.  Cortisol levels were appropriately elevated.  Advance activity and monitor.  ARF -  Due to above, resolved after IV fluids.   Newly diagnosed acutely decompensated Systolic CHF - EF 35-40% - due to low blood pressure and ARF cannot do Coreg or ACE/ARB, now close to being compensated, has some peripheral edema which is improving with low-dose twice daily Lasix and Unna boots.  Continue titrating off oxygen.  Monitor weight, intake output and electrolytes.  Filed Weights   11/30/17 0600 12/01/17 1953 12/02/17 2012  Weight: 86.7 kg (191 lb 2.2 oz) 86.5 kg (190  lb 11.2 oz) 78.9 kg (173 lb 15.1 oz)    CAD - ASA - asymptomatic - no focal WMA on TTE this admit   Acute hypoxemic resp failure - Acute pulm edema, resolved   B LE Cellulitis- suspect underlying PVD - will need outpt eval once renal fxn fully recovered   Normocytic anemia  - likely due to kidney disease - Hgb stable - Fe studies c/w borderline Fe deficiency and ACKD - supplement w/ oral Fe   Folate deficiency - Load w/ IV folate then begin daily oral folate replacement continued.  Borderline B12 - on B12 oral supplementation   Hypokalemia.  Replaced will monitor.    DVT prophylaxis: SQ heparin  Code Status: FULL CODE Family Communication: no family present at time of exam  Disposition Plan: transfer to med/surg bed - cont PT/OT - await SNF bed - titrate diuretic - follow renal fxn and BP   Consultants:  PCCM  Antimicrobials:  Vancomycin 5/1 > 5/6 Zosyn 5/2 > 5/6  Objective: Blood pressure (!) 113/55, pulse 71, temperature 98.7 F (37.1 C), temperature source Oral, resp. rate 18, height 5\' 1"  (1.549 m), weight 78.9 kg (173 lb 15.1 oz), SpO2 92 %.  Intake/Output Summary (Last 24 hours) at 12/03/2017 1041 Last data filed at 12/03/2017 8295 Gross per 24 hour  Intake 600 ml  Output 800 ml  Net -200 ml   Filed Weights   11/30/17 0600 12/01/17 1953 12/02/17 2012  Weight: 86.7 kg (191 lb 2.2 oz) 86.5 kg (190 lb 11.2 oz) 78.9 kg (173 lb 15.1 oz)  Examination:  Awake Alert, Oriented X 3, No new F.N deficits, Normal affect [place].AT,PERRAL Supple Neck,No JVD, No cervical lymphadenopathy appriciated.  Symmetrical Chest wall movement, Good air movement bilaterally, CTAB RRR,No Gallops, Rubs or new Murmurs, No Parasternal Heave +ve B.Sounds, Abd Soft, No tenderness, No organomegaly appriciated, No rebound - guarding or rigidity. No Cyanosis, Clubbing or edema, No new Rash or bruise   CBC: Recent Labs  Lab 11/27/17 0235  11/29/17 0248 11/30/17 0255 12/02/17 0558  WBC  6.7   < > 7.2 8.1 7.9  NEUTROABS 5.3  --   --   --   --   HGB 7.3*   < > 8.8* 8.7* 8.8*  HCT 23.5*   < > 29.0* 28.5* 29.5*  MCV 92.2   < > 92.9 92.8 96.4  PLT 169   < > 227 205 220   < > = values in this interval not displayed.   Basic Metabolic Panel: Recent Labs  Lab 11/28/17 1200 11/29/17 0248  12/02/17 0312 12/02/17 0558 12/03/17 0438  NA  --  143   < > 144 144 144  K  --  3.9   < > 3.5 3.6 3.4*  CL  --  110   < > 102 101 98*  CO2  --  28   < > 34* 34* 40*  GLUCOSE  --  114*   < > 89 101* 84  BUN  --  14   < > 7 7 7   CREATININE  --  1.16*   < > 1.16* 1.18* 1.22*  CALCIUM  --  8.2*   < > 8.4* 8.4* 8.2*  MG 1.4* 2.1  --   --   --  1.8   < > = values in this interval not displayed.   GFR: Estimated Creatinine Clearance: 41.4 mL/min (A) (by C-G formula based on SCr of 1.22 mg/dL (H)).  Liver Function Tests: Recent Labs  Lab 11/29/17 0248  AST 22  ALT 10*  ALKPHOS 43  BILITOT 0.3  PROT 5.1*  ALBUMIN 2.2*    HbA1C: Hgb A1c MFr Bld  Date/Time Value Ref Range Status  12/11/2009 07:55 AM 6.3 4.6 - 6.5 % Final    Comment:    See lab report for associated comment(s)    CBG: No results for input(s): GLUCAP in the last 168 hours.  Recent Results (from the past 240 hour(s))  MRSA PCR Screening     Status: None   Collection Time: 11/23/17  4:14 PM  Result Value Ref Range Status   MRSA by PCR NEGATIVE NEGATIVE Final    Comment:        The GeneXpert MRSA Assay (FDA approved for NASAL specimens only), is one component of a comprehensive MRSA colonization surveillance program. It is not intended to diagnose MRSA infection nor to guide or monitor treatment for MRSA infections. Performed at [facility], [address], [place], [place] [address]   Culture, Urine     Status: Abnormal   Collection Time: 11/27/17  8:46 PM  Result Value Ref Range Status   Specimen Description URINE, RANDOM  Final   Special Requests NONE  Final   Culture (A)  Final     <10,000 COLONIES/mL INSIGNIFICANT GROWTH Performed at [facility], [address], [place], [place] [address]    Report Status 11/29/2017 FINAL  Final     Scheduled Meds: . ammonium lactate   Topical q morning - 10a  . aspirin  81 mg Oral Daily  . atorvastatin  80 mg Oral q1800  . famotidine  10 mg Oral Daily  . fenofibrate  54 mg Oral Daily  . ferrous gluconate  324 mg Oral BID WC  . folic acid  1 mg Oral Daily  . furosemide  40 mg Oral BID  . heparin  5,000 Units Subcutaneous Q8H  . liver oil-zinc oxide   Topical q morning - 10a  . loratadine  10 mg Oral Daily  . mouth rinse  15 mL Mouth Rinse BID  . midodrine  10 mg Oral TID WC  . multivitamin with minerals  1 tablet Oral Daily  . triamcinolone  2 spray Nasal Daily  . [START ON 12/04/2017] vitamin B-12  1,000 mcg Oral Daily     LOS: 11 days   Signature  Susa Raring M.D on 12/03/2017 at 10:41 AM  Between 7am to 7pm - Pager - [phone] ( page via [email or web address], text pages only, please mention full 10 digit call back number).  After 7pm go to [email or web address] - password [facility]

## 2017-12-03 NOTE — Progress Notes (Signed)
RT Note: Patient was up in the chair and RT instructed her on how to use a flutter valve. She demonstrated the technique well with no complications. She has no questions presently and RT will continue to monitor and assist as needed.

## 2017-12-04 LAB — BASIC METABOLIC PANEL
Anion gap: 8 (ref 5–15)
BUN: <5 mg/dL — ABNORMAL LOW (ref 6–20)
CALCIUM: 8.5 mg/dL — AB (ref 8.9–10.3)
CO2: 44 mmol/L — ABNORMAL HIGH (ref 22–32)
CREATININE: 1.22 mg/dL — AB (ref 0.44–1.00)
Chloride: 92 mmol/L — ABNORMAL LOW (ref 101–111)
GFR calc non Af Amer: 44 mL/min — ABNORMAL LOW (ref >60)
GFR, EST AFRICAN AMERICAN: 51 mL/min — AB (ref >60)
Glucose, Bld: 91 mg/dL (ref 65–99)
Potassium: 3.6 mmol/L (ref 3.5–5.1)
Sodium: 144 mmol/L (ref 135–145)

## 2017-12-04 LAB — MAGNESIUM: Magnesium: 2 mg/dL (ref 1.7–2.4)

## 2017-12-04 NOTE — Telephone Encounter (Signed)
REFILL 

## 2017-12-04 NOTE — Progress Notes (Signed)
[place] TEAM 1 - Stepdown/ICU TEAM  Luis Sami  ZOX:[id number] DOB: 11-27-1945 DOA: 11/22/2017 PCP: Patient, No Pcp Per    Brief Narrative:  72 year old female former smoker (quit 4 years ago) with history of CAD with previous MI, COPD, and hypertension who presented 5/1 with a 1 week history of weakness, nausea vomiting, right greater than left lower extremity swelling with weeping ulcers.  In the ED she was noted to be in acute renal failure with creatinine of 5, and hyperkalemic with a potassium of 6.7.  She was admitted by the Hospitalist with acute renal failure and lower extremity cellulitis.  She had persistent hypotension despite 4.5L fluids with systolic blood pressure in the 60s.  PCCM was therefore consulted for ICU admission.  Significant Events: 5/1 admit 5/3 TTE - EF 35-40% - mild MR - PA peak 5/7 TRH resumed care   Subjective:  Patient in bed, appears comfortable, denies any headache, no fever, no chest pain or pressure, no shortness of breath , no abdominal pain. No focal weakness.   Assessment & Plan:  Hypotension combination of septic shock and hypovolemia , sepsis caused by bilateral lower extremity cellulitis.  She has been treated with empiric antibiotics, sepsis physiology has resolved, all blood cultures negative, clinically appears nontoxic, blood pressures have stabilized with addition of midodrine.  Cortisol levels were appropriately elevated.  Advance activity and monitor.  ARF -  Due to above, resolved after IV fluids.   Newly diagnosed acutely decompensated Systolic CHF - EF 35-40% - due to low blood pressure and ARF cannot do Coreg or ACE/ARB, now close to being compensated, has some peripheral edema which is improving with low-dose twice daily Lasix and Unna boots.  Continue titrating off oxygen.  Monitor weight, intake output and electrolytes.  Filed Weights   11/30/17 0600 12/01/17 1953 12/02/17 2012  Weight: 86.7 kg (191 lb 2.2 oz) 86.5 kg (190  lb 11.2 oz) 78.9 kg (173 lb 15.1 oz)    CAD - ASA - asymptomatic - no focal WMA on TTE this admit   Acute hypoxemic resp failure - Acute pulm edema, resolved   B LE Cellulitis- suspect underlying PVD - will need outpt eval once renal fxn fully recovered   Normocytic anemia  - likely due to kidney disease - Hgb stable - Fe studies c/w borderline Fe deficiency and ACKD - supplement w/ oral Fe   Folate deficiency - Load w/ IV folate then begin daily oral folate replacement continued.  Borderline B12 - on B12 oral supplementation   Hypokalemia.  Replaced will monitor.    DVT prophylaxis: SQ heparin  Code Status: FULL CODE Family Communication: no family present at time of exam  Disposition Plan: SNF once bed is available  Consultants:  PCCM  Antimicrobials:  Vancomycin 5/1 > 5/6 Zosyn 5/2 > 5/6  Objective: Blood pressure (!) 104/58, pulse 85, temperature 98.2 F (36.8 C), temperature source Oral, resp. rate 20, height 5\' 1"  (1.549 m), weight 78.9 kg (173 lb 15.1 oz), SpO2 91 %.  Intake/Output Summary (Last 24 hours) at 12/04/2017 1014 Last data filed at 12/04/2017 0859 Gross per 24 hour  Intake 720 ml  Output 1475 ml  Net -755 ml   Filed Weights   11/30/17 0600 12/01/17 1953 12/02/17 2012  Weight: 86.7 kg (191 lb 2.2 oz) 86.5 kg (190 lb 11.2 oz) 78.9 kg (173 lb 15.1 oz)    Examination:  Awake Alert, Oriented X 3, No new F.N deficits, Normal affect [place].AT,PERRAL  Supple Neck,No JVD, No cervical lymphadenopathy appriciated.  Symmetrical Chest wall movement, Good air movement bilaterally, CTAB RRR,No Gallops, Rubs or new Murmurs, No Parasternal Heave +ve B.Sounds, Abd Soft, No tenderness, No organomegaly appriciated, No rebound - guarding or rigidity. No Cyanosis, Clubbing or edema, No new Rash or bruise   CBC: Recent Labs  Lab 11/29/17 0248 11/30/17 0255 12/02/17 0558  WBC 7.2 8.1 7.9  HGB 8.8* 8.7* 8.8*  HCT 29.0* 28.5* 29.5*  MCV 92.9 92.8 96.4  PLT 227  205 220   Basic Metabolic Panel: Recent Labs  Lab 11/29/17 0248  12/02/17 0558 12/03/17 0438 12/04/17 0633  NA 143   < > 144 144 144  K 3.9   < > 3.6 3.4* 3.6  CL 110   < > 101 98* 92*  CO2 28   < > 34* 40* 44*  GLUCOSE 114*   < > 101* 84 91  BUN 14   < > 7 7 <5*  CREATININE 1.16*   < > 1.18* 1.22* 1.22*  CALCIUM 8.2*   < > 8.4* 8.2* 8.5*  MG 2.1  --   --  1.8 2.0   < > = values in this interval not displayed.   GFR: Estimated Creatinine Clearance: 41.4 mL/min (A) (by C-G formula based on SCr of 1.22 mg/dL (H)).  Liver Function Tests: Recent Labs  Lab 11/29/17 0248  AST 22  ALT 10*  ALKPHOS 43  BILITOT 0.3  PROT 5.1*  ALBUMIN 2.2*    HbA1C: Hgb A1c MFr Bld  Date/Time Value Ref Range Status  12/11/2009 07:55 AM 6.3 4.6 - 6.5 % Final    Comment:    See lab report for associated comment(s)    CBG: No results for input(s): GLUCAP in the last 168 hours.  Recent Results (from the past 240 hour(s))  Culture, Urine     Status: Abnormal   Collection Time: 11/27/17  8:46 PM  Result Value Ref Range Status   Specimen Description URINE, RANDOM  Final   Special Requests NONE  Final   Culture (A)  Final    <10,000 COLONIES/mL INSIGNIFICANT GROWTH Performed at [facility], [address] [place], [place] [address]    Report Status 11/29/2017 FINAL  Final     Scheduled Meds: . ammonium lactate   Topical q morning - 10a  . aspirin  81 mg Oral Daily  . atorvastatin  80 mg Oral q1800  . famotidine  10 mg Oral Daily  . fenofibrate  54 mg Oral Daily  . ferrous gluconate  324 mg Oral BID WC  . folic acid  1 mg Oral Daily  . furosemide  40 mg Oral BID  . heparin  5,000 Units Subcutaneous Q8H  . liver oil-zinc oxide   Topical q morning - 10a  . loratadine  10 mg Oral Daily  . mouth rinse  15 mL Mouth Rinse BID  . midodrine  10 mg Oral TID WC  . multivitamin with minerals  1 tablet Oral Daily  . triamcinolone  2 spray Nasal Daily  . vitamin B-12  1,000 mcg  Oral Daily     LOS: 12 days   Signature  Susa Raring M.D on 12/04/2017 at 10:14 AM  Between 7am to 7pm - Pager - [phone] ( page via [email or web address], text pages only, please mention full 10 digit call back number).  After 7pm go to [email or web address] - password [facility]

## 2017-12-04 NOTE — Clinical Social Work Note (Signed)
CSW talked with patient regarding facility responses and her choice for ST rehab. Patient chose [facility] (formerly [facility] They have initiated insurance authorization [facility] Medicare)  and patient will discharge to this facility on Tuesday, 5/14.  Genelle Bal, MSW, LCSW Licensed Clinical Social Worker Clinical Social Work Department [employer] [phone]

## 2017-12-04 NOTE — Care Management Important Message (Signed)
Important Message  Patient Details  Name: Beverly Wheeler MRN: [id number] Date of Birth: 05-07-1946   Medicare Important Message Given:  Yes    Ark Agrusa Stefan Church 12/04/2017, 3:51 PM

## 2017-12-05 DIAGNOSIS — I959 Hypotension, unspecified: Secondary | ICD-10-CM | POA: Diagnosis not present

## 2017-12-05 DIAGNOSIS — K219 Gastro-esophageal reflux disease without esophagitis: Secondary | ICD-10-CM | POA: Diagnosis not present

## 2017-12-05 DIAGNOSIS — R262 Difficulty in walking, not elsewhere classified: Secondary | ICD-10-CM | POA: Diagnosis not present

## 2017-12-05 DIAGNOSIS — N179 Acute kidney failure, unspecified: Secondary | ICD-10-CM | POA: Diagnosis not present

## 2017-12-05 DIAGNOSIS — J449 Chronic obstructive pulmonary disease, unspecified: Secondary | ICD-10-CM | POA: Diagnosis not present

## 2017-12-05 DIAGNOSIS — L03116 Cellulitis of left lower limb: Secondary | ICD-10-CM | POA: Diagnosis not present

## 2017-12-05 DIAGNOSIS — R279 Unspecified lack of coordination: Secondary | ICD-10-CM | POA: Diagnosis not present

## 2017-12-05 DIAGNOSIS — Z23 Encounter for immunization: Secondary | ICD-10-CM | POA: Diagnosis not present

## 2017-12-05 DIAGNOSIS — I5020 Unspecified systolic (congestive) heart failure: Secondary | ICD-10-CM | POA: Diagnosis not present

## 2017-12-05 DIAGNOSIS — A419 Sepsis, unspecified organism: Secondary | ICD-10-CM | POA: Diagnosis not present

## 2017-12-05 DIAGNOSIS — D649 Anemia, unspecified: Secondary | ICD-10-CM | POA: Diagnosis not present

## 2017-12-05 DIAGNOSIS — I5021 Acute systolic (congestive) heart failure: Secondary | ICD-10-CM | POA: Diagnosis not present

## 2017-12-05 DIAGNOSIS — J9600 Acute respiratory failure, unspecified whether with hypoxia or hypercapnia: Secondary | ICD-10-CM | POA: Diagnosis not present

## 2017-12-05 DIAGNOSIS — E785 Hyperlipidemia, unspecified: Secondary | ICD-10-CM | POA: Diagnosis not present

## 2017-12-05 DIAGNOSIS — R6521 Severe sepsis with septic shock: Secondary | ICD-10-CM | POA: Diagnosis not present

## 2017-12-05 DIAGNOSIS — J9611 Chronic respiratory failure with hypoxia: Secondary | ICD-10-CM | POA: Diagnosis not present

## 2017-12-05 DIAGNOSIS — R571 Hypovolemic shock: Secondary | ICD-10-CM | POA: Diagnosis not present

## 2017-12-05 DIAGNOSIS — M6281 Muscle weakness (generalized): Secondary | ICD-10-CM | POA: Diagnosis not present

## 2017-12-05 DIAGNOSIS — R269 Unspecified abnormalities of gait and mobility: Secondary | ICD-10-CM | POA: Diagnosis not present

## 2017-12-05 DIAGNOSIS — Z743 Need for continuous supervision: Secondary | ICD-10-CM | POA: Diagnosis not present

## 2017-12-05 DIAGNOSIS — I2510 Atherosclerotic heart disease of native coronary artery without angina pectoris: Secondary | ICD-10-CM | POA: Diagnosis not present

## 2017-12-05 DIAGNOSIS — R1311 Dysphagia, oral phase: Secondary | ICD-10-CM | POA: Diagnosis not present

## 2017-12-05 MED ORDER — FENOFIBRATE 54 MG PO TABS: 54.0000 mg | ORAL_TABLET | Freq: Every day | ORAL | Status: DC

## 2017-12-05 MED ORDER — FLUCONAZOLE 100 MG PO TABS
100.0000 mg | ORAL_TABLET | Freq: Once | ORAL | Status: AC
  Administered 2017-12-05: 100 mg via ORAL
  Filled 2017-12-05: qty 1

## 2017-12-05 MED ORDER — POTASSIUM CHLORIDE ER 20 MEQ PO TBCR: 20.0000 meq | EXTENDED_RELEASE_TABLET | Freq: Every day | ORAL | Status: DC

## 2017-12-05 MED ORDER — CYANOCOBALAMIN 1000 MCG PO TABS: 1000.0000 ug | ORAL_TABLET | Freq: Every day | ORAL | Status: AC

## 2017-12-05 MED ORDER — FOLIC ACID 1 MG PO TABS: 1.0000 mg | ORAL_TABLET | Freq: Every day | ORAL | Status: DC

## 2017-12-05 MED ORDER — MIDODRINE HCL 10 MG PO TABS: 5.0000 mg | ORAL_TABLET | Freq: Three times a day (TID) | ORAL | Status: DC

## 2017-12-05 MED ORDER — FUROSEMIDE 40 MG PO TABS: 40.0000 mg | ORAL_TABLET | Freq: Every day | ORAL | Status: DC

## 2017-12-05 MED ORDER — FERROUS GLUCONATE 324 (38 FE) MG PO TABS: 324.0000 mg | ORAL_TABLET | Freq: Two times a day (BID) | ORAL | 3 refills | Status: DC

## 2017-12-05 MED ORDER — NEBIVOLOL HCL 5 MG PO TABS: 5.0000 mg | ORAL_TABLET | Freq: Every day | ORAL | Status: DC

## 2017-12-05 NOTE — Clinical Social Work Placement (Signed)
   CLINICAL SOCIAL WORK PLACEMENT  NOTE 12/05/17 - DISCHARGED TO [facility] VIA AMBULANCE  Date:  12/05/2017  Patient Details  Name: Beverly Wheeler MRN: [id number] Date of Birth: [birth year]  Clinical Social Work is seeking post-discharge placement for this patient at the Skilled  Nursing Facility level of care (*CSW will initial, date and re-position this form in  chart as items are completed):  Yes   Patient/family provided with [facility] Clinical Social Work Department's list of facilities offering this level of care within the geographic area requested by the patient (or if unable, by the patient's family).  Yes   Patient/family informed of their freedom to choose among providers that offer the needed level of care, that participate in Medicare, Medicaid or managed care program needed by the patient, have an available bed and are willing to accept the patient.  Yes   Patient/family informed of [facility]'s ownership interest in [facility] and [facility], as well as of the fact that they are under no obligation to receive care at these facilities.  PASRR submitted to EDS on 11/26/17     PASRR number received on 11/26/17     Existing PASRR number confirmed on       FL2 transmitted to all facilities in geographic area requested by pt/family on 11/27/17     FL2 transmitted to all facilities within larger geographic area on       Patient informed that his/her managed care company has contracts with or will negotiate with certain facilities, including the following:        Yes   Patient/family informed of bed offers received.  Patient chooses bed at [facility]     Physician recommends and patient chooses bed at      Patient to be transferred to [facility] on 12/05/17.  Patient to be transferred to facility by Ambulance     Patient family notified on 12/05/17 of transfer.  Name of family  member notified:        PHYSICIAN       Additional Comment:    _______________________________________________ Cristobal Goldmann, LCSW 12/05/2017, 5:28 PM

## 2017-12-05 NOTE — Progress Notes (Deleted)
Patient discharged to SNF: [facility]. Patient AVS reviewed with patient.IV removed. Patient belongings sent with patient. Patient educated to return to the ED in the event of SOB, chest pain or dizziness.   Jaynee Eagles, RN

## 2017-12-05 NOTE — Progress Notes (Signed)
Physical Therapy Treatment Patient Details Name: Beverly Wheeler MRN: [id number] DOB: 1945-08-14 Today's Date: 12/05/2017    History of Present Illness 72 year old female admitted with weakness, N/V, edema with acute renal failure and lower extremity cellulitis. Pt with hypotension acutely. PMhx: CAD, MI, COPD, HTN     PT Comments    Continuing work on functional mobility and activity tolerance;  Performed O2 qualifying walk; she desatted significantly with activity on Room Air (see other PT note of this date); walked on 2-3 L supplemental O2, used RW well; Overall progressing well; Anticipate continuing good progress at post-acute rehabilitation.   Follow Up Recommendations  SNF;Supervision/Assistance - 24 hour     Equipment Recommendations  Rolling walker with 5" wheels    Recommendations for Other Services       Precautions / Restrictions Precautions Precautions: Fall    Mobility  Bed Mobility                  Transfers Overall transfer level: Needs assistance Equipment used: Rolling walker (2 wheeled) Transfers: Sit to/from Stand Sit to Stand: Min guard         General transfer comment: cues for hand placement  Ambulation/Gait Ambulation/Gait assistance: Min guard Ambulation Distance (Feet): 120 Feet Assistive device: Rolling walker (2 wheeled) Gait Pattern/deviations: Step-through pattern;Decreased stride length;Trunk flexed   Gait velocity interpretation: <1.31 ft/sec, indicative of household ambulator General Gait Details: cues for safety, turning and stepping into RW. pt veering left throughout with assist to achieve and maintain midline   Stairs             Wheelchair Mobility    Modified Rankin (Stroke Patients Only)       Balance             Standing balance-Leahy Scale: Poor                              Cognition Arousal/Alertness: Awake/alert Behavior During Therapy: WFL for tasks assessed/performed Overall  Cognitive Status: Within Functional Limits for tasks assessed                                        Exercises      General Comments General comments (skin integrity, edema, etc.): See also other note of this date for O2 qualifying walk      Pertinent Vitals/Pain Pain Assessment: No/denies pain    Home Living                      Prior Function            PT Goals (current goals can now be found in the care plan section) Acute Rehab PT Goals Patient Stated Goal: agreeable to amb PT Goal Formulation: With patient Time For Goal Achievement: 12/09/17 Potential to Achieve Goals: Good Progress towards PT goals: Progressing toward goals    Frequency    Min 2X/week      PT Plan Current plan remains appropriate    Co-evaluation              AM-PAC PT "6 Clicks" Daily Activity  Outcome Measure  Difficulty turning over in bed (including adjusting bedclothes, sheets and blankets)?: A Lot Difficulty moving from lying on back to sitting on the side of the bed? : A Lot Difficulty sitting down on and standing up  from a chair with arms (e.g., wheelchair, bedside commode, etc,.)?: A Little Help needed moving to and from a bed to chair (including a wheelchair)?: A Little Help needed walking in hospital room?: A Little Help needed climbing 3-5 steps with a railing? : A Lot 6 Click Score: 15    End of Session Equipment Utilized During Treatment: Gait belt;Oxygen Activity Tolerance: Patient tolerated treatment well Patient left: in chair;with call bell/phone within reach;with chair alarm set Nurse Communication: Mobility status PT Visit Diagnosis: Pain;History of falling (Z91.81);Muscle weakness (generalized) (M62.81);Other abnormalities of gait and mobility (R26.89) Pain - Right/Left: (bilateral) Pain - part of body: Leg     Time: 4696-2952 PT Time Calculation (min) (ACUTE ONLY): 27 min  Charges:  $Gait Training: 8-22 mins $Therapeutic  Activity: 8-22 mins                    G Codes:       Van Clines, PT  Acute Rehabilitation Services Pager [phone] Office [phone]    Levi Aland 12/05/2017, 5:57 PM

## 2017-12-05 NOTE — Progress Notes (Signed)
Physical Therapy Treatment Note  Clinical Impression: SATURATION QUALIFICATIONS: (This note is used to comply with regulatory documentation for home oxygen)  Patient Saturations on Room Air at Rest = 88%  Patient Saturations on Room Air while Ambulating = 79%  Patient Saturations on 2-3 Liters of oxygen while Ambulating = 93%  Please briefly explain why patient needs home oxygen: Patient requires supplemental oxygen to maintain oxygen saturations at acceptable, safe levels with physical activity.  Van Clines, [place]  Acute Rehabilitation Services Pager [phone] Office [phone]

## 2017-12-05 NOTE — Progress Notes (Signed)
Patient discharged to SNF: [facility]. Patient AVS reviewed. Report given to RN at [facility]. IV removed. Patient belongings sent with patient. Patient educated to return to the ED in the event of SOB, chest pain or dizziness.   Jaynee Eagles, RN

## 2017-12-05 NOTE — Discharge Summary (Signed)
Beverly Wheeler ZOX:[id number] DOB: [birth year] DOA: 11/22/2017  PCP: Patient, No Pcp Per  Admit date: 11/22/2017  Discharge date: 12/05/2017  Admitted From: Home  Disposition:  SNF   Recommendations for Outpatient Follow-up:   Follow up with PCP in 1-2 weeks  PCP Please obtain BMP/CBC, 2 view CXR in 1week,  (see Discharge instructions)   PCP Please follow up on the following pending results: BMP closely monitor   Home Health: None   Equipment/Devices: None  Consultations: None Discharge Condition: Stable   CODE STATUS: Full   Diet Recommendation: Heart Healthy     Chief Complaint  Patient presents with  . Weakness     Brief history of present illness from the day of admission and additional interim summary    72 year old female former smoker (quit 4 years ago) with history of CAD with previous MI, COPD, and hypertension who presented 5/1 with a 1 week history of weakness, nausea vomiting, right greater than left lower extremity swelling with weeping ulcers. In the ED she was noted to be in acute renal failure with creatinine of 5, and hyperkalemic with a potassium of 6.7.  She was admitted by the Hospitalist with acute renal failure and lower extremity cellulitis. She had persistent hypotension despite 4.5L fluidswith systolic blood pressure in the 60s. PCCM was therefore consulted for ICU admission.   Significant Events: 5/1 admit 5/3 TTE - EF 35-40% - mild MR - PA peak 5/7 TRH resumed care                                                                    Hospital Course    Hypotension combination of septic shock and hypovolemia , sepsis caused by bilateral lower extremity cellulitis.  She has been treated with empiric antibiotics, sepsis physiology has resolved, all blood cultures negative,  clinically appears nontoxic, blood pressures have stabilized with addition of midodrine.  Cortisol levels were appropriately elevated.  Advance activity and monitor.  ARF -  Due to above, resolved after IV fluids.   Newly diagnosed acutely decompensated Systolic CHF - EF 35-40% - due to low blood pressure and ARF cannot do ACE/ARB, now close to being compensated, low-dose beta-blocker Bystolic which she is used to at home, continue low-dose Lasix as tolerated by blood pressure along with potassium supplementation, she has chronic edema for which she continues to use Unna boots.  Continue titrating off oxygen.  Monitor weight, intake output and electrolytes SNF closely post discharge.  She also gets wound care for chronic lower extremity venous stasis ulcers on a regular basis which should be continued.       Filed Weights   11/30/17 0600 12/01/17 1953 12/02/17 2012  Weight: 86.7 kg (191 lb 2.2 oz) 86.5 kg (190 lb 11.2 oz) 78.9 kg (  173 lb 15.1 oz)    CAD - ASA - asymptomatic - no focal WMA on TTE this admit, continue home dose aspirin and statin and low-dose beta-blocker for secondary prevention.  Acute hypoxemic resp failure- Acute pulm edema, resolved   B LE Cellulitis- suspect underlying PVD - will need outpt eval once renal fxn fully recovered   Normocytic anemia  - likely due to kidney disease - Hgb stable - Fe studies c/w borderline Fe deficiency and ACKD - supplement w/ oral Fe   Folate deficiency - Load w/ IV folate then begin daily oral folate replacement continued.  Borderline B12 - on B12 oral supplementation   Hypokalemia.  Replaced will monitor.         Discharge diagnosis     Active Problems:   Acute renal failure (ARF) (HCC)   Hypotension   Pressure injury of skin   Acute kidney injury (HCC)   Cellulitis of left lower extremity   Sepsis (HCC)   Septic shock (HCC)   Hypovolemic shock (HCC)   Hyperkalemia   Acute respiratory failure with  hypoxia [facility])    Discharge instructions    Discharge Instructions    Discharge instructions   Complete by:  As directed    Follow with Primary MD Patient, No Pcp Per in 7 days   Get CBC, CMP, 2 view Chest X ray checked  by Primary MD or SNF MD in 5-7 days   Activity: As tolerated with Full fall precautions use walker/cane & assistance as needed  Disposition SNF  Diet:    Heart Healthy  - Check your Weight same time everyday, if you gain over 2 pounds, or you develop in leg swelling, experience more shortness of breath or chest pain, call your Primary MD immediately. Follow Cardiac Low Salt Diet and 1.5 lit/day fluid restriction.  Special Instructions: If you have smoked or chewed Tobacco  in the last 2 yrs please stop smoking, stop any regular Alcohol  and or any Recreational drug use.  On your next visit with your primary care physician please Get Medicines reviewed and adjusted.  Please request your Prim.MD to go over all Hospital Tests and Procedure/Radiological results at the follow up, please get all Hospital records sent to your Prim MD by signing hospital release before you go home.  If you experience worsening of your admission symptoms, develop shortness of breath, life threatening emergency, suicidal or homicidal thoughts you must seek medical attention immediately by calling 911 or calling your MD immediately  if symptoms less severe.  You Must read complete instructions/literature along with all the possible adverse reactions/side effects for all the Medicines you take and that have been prescribed to you. Take any new Medicines after you have completely understood and accpet all the possible adverse reactions/side effects.   Increase activity slowly   Complete by:  As directed       Discharge Medications   Allergies as of 12/05/2017   No Known Allergies     Medication List    STOP taking these medications   albuterol 108 (90 Base) MCG/ACT inhaler Commonly known  as:  PROAIR HFA   ALPRAZolam 0.25 MG tablet Commonly known as:  XANAX   irbesartan 300 MG tablet Commonly known as:  AVAPRO   pantoprazole 40 MG tablet Commonly known as:  PROTONIX   vitamin E 400 UNIT capsule     TAKE these medications   acetaminophen 500 MG tablet Commonly known as:  TYLENOL Take 1,000 mg by  mouth every 6 (six) hours as needed for mild pain.   aspirin 81 MG EC tablet Take 1 tablet (81 mg total) by mouth daily.   atorvastatin 80 MG tablet Commonly known as:  LIPITOR Take 1 tablet (80 mg total) by mouth daily at 6 PM. KEEP OV. What changed:  See the new instructions.   cetirizine 10 MG tablet Commonly known as:  ZYRTEC Take 10 mg by mouth daily.   cyanocobalamin 1000 MCG tablet Take 1 tablet (1,000 mcg total) by mouth daily. Start taking on:  12/06/2017   fenofibrate 54 MG tablet Take 1 tablet (54 mg total) by mouth daily. Start taking on:  12/06/2017   ferrous gluconate 324 MG tablet Commonly known as:  FERGON Take 1 tablet (324 mg total) by mouth 2 (two) times daily with a meal.   folic acid 1 MG tablet Commonly known as:  FOLVITE Take 1 tablet (1 mg total) by mouth daily. Start taking on:  12/06/2017   furosemide 40 MG tablet Commonly known as:  LASIX Take 1 tablet (40 mg total) by mouth daily.   midodrine 10 MG tablet Commonly known as:  PROAMATINE Take 0.5 tablets (5 mg total) by mouth 3 (three) times daily with meals.   multivitamin with minerals Tabs tablet Take 1 tablet by mouth daily.   NASACORT ALLERGY 24HR 55 MCG/ACT Aero nasal inhaler Generic drug:  triamcinolone Place 2 sprays into the nose daily.   nebivolol 5 MG tablet Commonly known as:  BYSTOLIC Take 1 tablet (5 mg total) by mouth daily. What changed:    medication strength  See the new instructions.   nitroGLYCERIN 0.4 MG SL tablet Commonly known as:  NITROSTAT Place 1 tablet (0.4 mg total) under the tongue every 5 (five) minutes as needed for chest pain.     Potassium Chloride ER 20 MEQ Tbcr Take 20 mEq by mouth daily.   ranitidine 150 MG tablet Commonly known as:  ZANTAC Take 150 mg by mouth 2 (two) times daily.       Follow-up Information    PCP. Schedule an appointment as soon as possible for a visit in 1 week(s).           Major procedures and Radiology Reports - PLEASE review detailed and final reports thoroughly  -         US Renal  Result Date: 11/27/2017 CLINICAL DATA:  Acute renal failure. EXAM: RENAL / URINARY TRACT ULTRASOUND COMPLETE COMPARISON:  None. FINDINGS: Right Kidney: Length: 9.7 cm. Echogenicity within normal limits. No mass or hydronephrosis visualized. Left Kidney: Length: 10.8 cm. No significant abnormalities. Dromedary hump seen laterally on the left. Bladder: Appears normal for degree of bladder distention. IMPRESSION: No cause for renal failure identified.  Normal study. Electronically Signed   By: Gerome Sam III M.D   On: 11/27/2017 11:53   Dg Chest Port 1 View  Result Date: 12/02/2017 CLINICAL DATA:  Shortness of breath EXAM: PORTABLE CHEST 1 VIEW COMPARISON:  Nov 28, 2017 FINDINGS: Persistent layering effusion on the right. Increasing opacity in left base, possibly atelectasis versus infiltrate. Stable cardiomegaly. The hila and mediastinum are unchanged. Mild increased opacity in the right mid lung. IMPRESSION: 1. Stable effusion and opacity in the right base. 2. Mild increased opacity in the right mid lung. 3. Increasing opacity in left base may represent atelectasis versus infiltrate. Electronically Signed   By: Gerome Sam III M.D   On: 12/02/2017 09:16   Dg Chest Portable 1 View  Result Date: 11/28/2017 CLINICAL DATA:  Shortness of breath, suspect fluid overload. History of coronary artery disease and previous MI, COPD, sepsis, lower extremity cellulitis. EXAM: PORTABLE CHEST 1 VIEW COMPARISON:  Portable chest x-ray of Nov 27, 2016 FINDINGS: The lungs remain hypoinflated. There is increased  density in left retrocardiac region. There is increased density at the right lung base. The cardiac silhouette is enlarged. The pulmonary vascularity is engorged. The interstitial markings are mildly increased. There is calcification in the wall of the aortic arch. There is multilevel degenerative disc disease of the thoracic spine. IMPRESSION: CHF with mild interstitial edema. Bilateral pleural effusions layering inferiorly and slightly posteriorly on the right. Probable left lower lobe atelectasis or pneumonia. Thoracic aortic atherosclerosis. Electronically Signed   By: David  [place] M.D.   On: 11/28/2017 10:01   Dg Chest Port 1 View  Result Date: 11/27/2017 CLINICAL DATA:  Shortness of breath, respiratory failure, history COPD, hypertension, coronary artery disease post MI EXAM: PORTABLE CHEST 1 VIEW COMPARISON:  Portable exam 0557 hours compared to 11/26/2017 FINDINGS: Enlargement of cardiac silhouette with pulmonary vascular congestion. Atherosclerotic calcification aorta. Improved pulmonary edema. Small bibasilar effusions and atelectasis. No pneumothorax. Chronic RIGHT rotator cuff tear. IMPRESSION: Improved CHF. Electronically Signed   By: Ulyses Southward M.D.   On: 11/27/2017 09:31   Dg Chest Port 1 View  Result Date: 11/26/2017 CLINICAL DATA:  71 year old female with history of respiratory failure. EXAM: PORTABLE CHEST 1 VIEW COMPARISON:  Chest x-ray 11/25/2017. FINDINGS: Bibasilar opacities appears slightly worsened and may reflect areas of atelectasis and/or airspace consolidation. Small to moderate bilateral pleural effusions. There is cephalization of the pulmonary vasculature and slight indistinctness of the interstitial markings suggestive of mild pulmonary edema. Mild cardiomegaly. Aortic atherosclerosis. IMPRESSION: 1. Overall, the appearance the chest suggests worsening congestive heart failure, as detailed above. 2. Aortic atherosclerosis. Electronically Signed   By: Trudie Reed M.D.    On: 11/26/2017 07:46   Dg Chest Port 1 View  Result Date: 11/25/2017 CLINICAL DATA:  72 year old female with history of respiratory failure. EXAM: PORTABLE CHEST 1 VIEW COMPARISON:  Chest x-ray 11/22/2017. FINDINGS: Lung volumes are low. Bibasilar opacities may reflect areas of atelectasis and/or consolidation. Small bilateral pleural effusions. Pulmonary vasculature does not appear engorged. Heart size appears upper limits of normal, likely accentuated by AP technique. The patient is rotated to the right on today's exam, resulting in distortion of the mediastinal contours and reduced diagnostic sensitivity and specificity for mediastinal pathology. Aortic atherosclerosis. IMPRESSION: 1. Low lung volumes with bibasilar areas of atelectasis and/or airspace consolidation and superimposed small bilateral pleural effusions. 2. Aortic atherosclerosis. Electronically Signed   By: Trudie Reed M.D.   On: 11/25/2017 08:10   Dg Chest Portable 1 View  Result Date: 11/22/2017 CLINICAL DATA:  Weakness, nausea/vomiting x1 week EXAM: PORTABLE CHEST 1 VIEW COMPARISON:  01/28/2014 FINDINGS: Lungs are clear.  No pleural effusion or pneumothorax. The heart is top-normal in size. IMPRESSION: No evidence of acute cardiopulmonary disease. Electronically Signed   By: Charline Bills M.D.   On: 11/22/2017 20:40    Micro Results     Recent Results (from the past 240 hour(s))  Culture, Urine     Status: Abnormal   Collection Time: 11/27/17  8:46 PM  Result Value Ref Range Status   Specimen Description URINE, RANDOM  Final   Special Requests NONE  Final   Culture (A)  Final    <10,000 COLONIES/mL INSIGNIFICANT GROWTH Performed at [facility], [address] [place], [place] [address]    Report Status 11/29/2017 FINAL  Final    Today   Subjective    Beverly Wheeler today has no headache,no chest abdominal pain,no new weakness tingling or numbness, feels much better.   Objective   Blood pressure  (!) 111/50, pulse 74, temperature 98.5 F (36.9 C), temperature source Oral, resp. rate 18, height 5\' 1"  (1.549 m), weight 78.9 kg (173 lb 15.1 oz), SpO2 96 %.   Intake/Output Summary (Last 24 hours) at 12/05/2017 1420 Last data filed at 12/05/2017 1347 Gross per 24 hour  Intake 600 ml  Output 700 ml  Net -100 ml    Exam Awake Alert, Oriented x 3, No new F.N deficits, Normal affect Richards.AT,PERRAL Supple Neck,No JVD, No cervical lymphadenopathy appriciated.  Symmetrical Chest wall movement, Good air movement bilaterally, CTAB RRR,No Gallops,Rubs or new Murmurs, No Parasternal Heave +ve B.Sounds, Abd Soft, Non tender, No organomegaly appriciated, No rebound -guarding or rigidity. No Cyanosis, Clubbing , chronic 1+ edema, No new Rash or bruise   Data Review   CBC w Diff:  Lab Results  Component Value Date   WBC 7.9 12/02/2017   HGB 8.8 (L) 12/02/2017   HCT 29.5 (L) 12/02/2017   PLT 220 12/02/2017   LYMPHOPCT 13 11/27/2017   MONOPCT 5 11/27/2017   EOSPCT 2 11/27/2017   BASOPCT 1 11/27/2017    CMP:  Lab Results  Component Value Date   NA 144 12/04/2017   K 3.6 12/04/2017   CL 92 (L) 12/04/2017   CO2 44 (H) 12/04/2017   BUN <5 (L) 12/04/2017   CREATININE 1.22 (H) 12/04/2017   CREATININE 0.94 02/20/2014   PROT 5.1 (L) 11/29/2017   ALBUMIN 2.2 (L) 11/29/2017   BILITOT 0.3 11/29/2017   ALKPHOS 43 11/29/2017   AST 22 11/29/2017   ALT 10 (L) 11/29/2017  .   Total Time in preparing paper work, data evaluation and todays exam - 35 minutes  Susa Raring M.D on 12/05/2017 at 2:20 PM  [facility]   Office  [phone]

## 2017-12-05 NOTE — Discharge Instructions (Signed)
Follow with Primary MD Patient, No Pcp Per in 7 days   Get CBC, CMP, 2 view Chest X ray checked  by Primary MD or SNF MD in 5-7 days   Activity: As tolerated with Full fall precautions use walker/cane & assistance as needed  Disposition SNF  Diet:    Heart Healthy  - Check your Weight same time everyday, if you gain over 2 pounds, or you develop in leg swelling, experience more shortness of breath or chest pain, call your Primary MD immediately. Follow Cardiac Low Salt Diet and 1.5 lit/day fluid restriction.  Special Instructions: If you have smoked or chewed Tobacco  in the last 2 yrs please stop smoking, stop any regular Alcohol  and or any Recreational drug use.  On your next visit with your primary care physician please Get Medicines reviewed and adjusted.  Please request your Prim.MD to go over all Hospital Tests and Procedure/Radiological results at the follow up, please get all Hospital records sent to your Prim MD by signing hospital release before you go home.  If you experience worsening of your admission symptoms, develop shortness of breath, life threatening emergency, suicidal or homicidal thoughts you must seek medical attention immediately by calling 911 or calling your MD immediately  if symptoms less severe.  You Must read complete instructions/literature along with all the possible adverse reactions/side effects for all the Medicines you take and that have been prescribed to you. Take any new Medicines after you have completely understood and accpet all the possible adverse reactions/side effects.

## 2017-12-05 NOTE — Care Management Note (Addendum)
Case Management Note  Patient Details  Name: Beverly Wheeler MRN: [id number] Date of Birth: 1945/11/30  Subjective/Objective: History of CAD with previous MI, COPD, and hypertension; admitted for Acute Renal Failure.                  Action/Plan: Once patient is medically stable for discharge and bed available plan to discharge to SNF, per CSW "Erie Noe" arrangements. NCM will continue to follow how patient progresses.  Expected Discharge Date:  12/05/17               Expected Discharge Plan:  Skilled Nursing Facility  In-House Referral:  Clinical Social Work  Discharge planning Services  CM Consult  Status of Service:  In process, will continue to follow  Yancey Flemings, RN 12/05/2017, 3:52 PM

## 2017-12-06 DIAGNOSIS — J449 Chronic obstructive pulmonary disease, unspecified: Secondary | ICD-10-CM | POA: Diagnosis not present

## 2017-12-06 DIAGNOSIS — I2510 Atherosclerotic heart disease of native coronary artery without angina pectoris: Secondary | ICD-10-CM | POA: Diagnosis not present

## 2017-12-06 DIAGNOSIS — K219 Gastro-esophageal reflux disease without esophagitis: Secondary | ICD-10-CM | POA: Diagnosis not present

## 2017-12-06 DIAGNOSIS — I5020 Unspecified systolic (congestive) heart failure: Secondary | ICD-10-CM | POA: Diagnosis not present

## 2017-12-07 DIAGNOSIS — J449 Chronic obstructive pulmonary disease, unspecified: Secondary | ICD-10-CM | POA: Diagnosis not present

## 2017-12-07 DIAGNOSIS — I2510 Atherosclerotic heart disease of native coronary artery without angina pectoris: Secondary | ICD-10-CM | POA: Diagnosis not present

## 2017-12-13 DIAGNOSIS — E785 Hyperlipidemia, unspecified: Secondary | ICD-10-CM | POA: Diagnosis not present

## 2017-12-13 DIAGNOSIS — J449 Chronic obstructive pulmonary disease, unspecified: Secondary | ICD-10-CM | POA: Diagnosis not present

## 2017-12-13 DIAGNOSIS — I5020 Unspecified systolic (congestive) heart failure: Secondary | ICD-10-CM | POA: Diagnosis not present

## 2017-12-13 DIAGNOSIS — I2510 Atherosclerotic heart disease of native coronary artery without angina pectoris: Secondary | ICD-10-CM | POA: Diagnosis not present

## 2017-12-20 DIAGNOSIS — I5020 Unspecified systolic (congestive) heart failure: Secondary | ICD-10-CM | POA: Diagnosis not present

## 2017-12-20 DIAGNOSIS — E785 Hyperlipidemia, unspecified: Secondary | ICD-10-CM | POA: Diagnosis not present

## 2017-12-20 DIAGNOSIS — I2510 Atherosclerotic heart disease of native coronary artery without angina pectoris: Secondary | ICD-10-CM | POA: Diagnosis not present

## 2017-12-20 DIAGNOSIS — J449 Chronic obstructive pulmonary disease, unspecified: Secondary | ICD-10-CM | POA: Diagnosis not present

## 2017-12-27 DIAGNOSIS — I5020 Unspecified systolic (congestive) heart failure: Secondary | ICD-10-CM | POA: Diagnosis not present

## 2017-12-27 DIAGNOSIS — E785 Hyperlipidemia, unspecified: Secondary | ICD-10-CM | POA: Diagnosis not present

## 2017-12-27 DIAGNOSIS — D649 Anemia, unspecified: Secondary | ICD-10-CM | POA: Diagnosis not present

## 2018-01-03 ENCOUNTER — Other Ambulatory Visit: Payer: Self-pay

## 2018-01-03 NOTE — Patient Outreach (Signed)
[facility]) Care Management  01/03/2018  [name] 28-Jul-1945 [id number]   Medication Adherence call to Mrs. Beverly Wheeler spoke with patient she is no longer taking Irbesartan 300 mg doctor took her off this medication  on Atorvastatin 80 mg she is still on this medication but she was at rehab for the past 3-4 weeks and rehab was providing all her medication she still has medication and does not need any at this time, she has an appointment with her primary doctor next week and will go over it, she said some of her medications were change when she was in the hospital. [name] is showing past due under [facility].   Lillia Abed CPhT Pharmacy Technician [facility] Care Management Direct Dial [phone]  Fax [phone] [email or web address]

## 2018-01-08 ENCOUNTER — Ambulatory Visit (INDEPENDENT_AMBULATORY_CARE_PROVIDER_SITE_OTHER): Payer: Medicare Other | Admitting: Family Medicine

## 2018-01-08 ENCOUNTER — Encounter: Payer: Self-pay | Admitting: Family Medicine

## 2018-01-08 VITALS — BP 120/60 | HR 63 | Temp 98.3°F | Resp 12 | Ht 61.0 in | Wt 157.4 lb

## 2018-01-08 DIAGNOSIS — E875 Hyperkalemia: Secondary | ICD-10-CM | POA: Diagnosis not present

## 2018-01-08 DIAGNOSIS — E782 Mixed hyperlipidemia: Secondary | ICD-10-CM

## 2018-01-08 DIAGNOSIS — J9601 Acute respiratory failure with hypoxia: Secondary | ICD-10-CM

## 2018-01-08 DIAGNOSIS — I872 Venous insufficiency (chronic) (peripheral): Secondary | ICD-10-CM | POA: Diagnosis not present

## 2018-01-08 DIAGNOSIS — N179 Acute kidney failure, unspecified: Secondary | ICD-10-CM | POA: Diagnosis not present

## 2018-01-08 DIAGNOSIS — R9389 Abnormal findings on diagnostic imaging of other specified body structures: Secondary | ICD-10-CM

## 2018-01-08 DIAGNOSIS — I10 Essential (primary) hypertension: Secondary | ICD-10-CM | POA: Diagnosis not present

## 2018-01-08 DIAGNOSIS — D509 Iron deficiency anemia, unspecified: Secondary | ICD-10-CM

## 2018-01-08 DIAGNOSIS — F419 Anxiety disorder, unspecified: Secondary | ICD-10-CM

## 2018-01-08 LAB — LIPID PANEL
CHOL/HDL RATIO: 4
CHOLESTEROL: 170 mg/dL (ref 0–200)
HDL: 42.9 mg/dL (ref >39.00)
LDL Cholesterol: 94 mg/dL (ref 0–99)
NonHDL: 127.33
TRIGLYCERIDES: 168 mg/dL — AB (ref 0.0–149.0)
VLDL: 33.6 mg/dL (ref 0.0–40.0)

## 2018-01-08 LAB — BASIC METABOLIC PANEL
BUN: 17 mg/dL (ref 6–23)
CHLORIDE: 95 meq/L — AB (ref 96–112)
CO2: 37 meq/L — AB (ref 19–32)
Calcium: 9.4 mg/dL (ref 8.4–10.5)
Creatinine, Ser: 1.28 mg/dL — ABNORMAL HIGH (ref 0.40–1.20)
GFR: 43.85 mL/min — ABNORMAL LOW (ref >60.00)
Glucose, Bld: 95 mg/dL (ref 70–99)
POTASSIUM: 4.7 meq/L (ref 3.5–5.1)
Sodium: 141 mEq/L (ref 135–145)

## 2018-01-08 NOTE — Assessment & Plan Note (Signed)
Recommend compression stockings and/or lower extremity elevation. Continue Furosemide 40 mg daily. Good skin care. OTC hydrocortisone twice daily as needed for skin pruritus. She will continue monitoring for worsening symptoms.

## 2018-01-08 NOTE — Assessment & Plan Note (Signed)
No changes in current management, will follow labs done today and will give further recommendations accordingly.  

## 2018-01-08 NOTE — Patient Instructions (Addendum)
A few things to remember from today's visit:   Hyperkalemia - Plan: Basic metabolic panel  Acute renal failure, unspecified acute renal failure type (HCC)  Venous stasis dermatitis of both lower extremities  Hyperlipidemia, mixed - Plan: Lipid panel   Vein disease is a condition that can affect the veins in the legs. It can cause leg pain, varicose veins, swollen legs, or open sores. Varicose veins are swollen and twisted veins. Things that may help: leg exercises (ankle flexion, walking),compression stocking, OTC horse chestnut seed extract 300 mg twice daily, for itchy skin cortisone and moisturizers.  Compression stockings- [facility] in [place]  Please be sure medication list is accurate. If a new problem present, please set up appointment sooner than planned today.

## 2018-01-08 NOTE — Progress Notes (Signed)
HPI:   [name] is a 72 y.o. female, who is here today with her son to establish care.  Former PCP: Dr. Selena Batten Last preventive routine visit: Over a year ago.  Chronic medical problems: Depression, anxiety, GERD,hyperlipidemia, allergies,and CAD among some.   She had recent hospitalization. She presented to the ER with a week history of generalized weakness, nausea, vomiting, and worsening lower extremity edema with a ulcer.  [facility] call on 01/03/18.   She was admitted on 11/22/2017 and discharged on 12/05/2017 to a SNF.  Upon admission she was hypotensive, creatinine was 5, and potassium was 6.7. Diagnosed with decompensated systolic CHF with acute pulmonary edema. She was treated for bilateral lower extremity cellulitis.  Anemia of chronic disease and iron deficiency. She is currently on ferrous gluconate 325 mg twice daily.  Lab Results  Component Value Date   WBC 7.9 12/02/2017   HGB 8.8 (L) 12/02/2017   HCT 29.5 (L) 12/02/2017   MCV 96.4 12/02/2017   PLT 220 12/02/2017    Currently she is on Furosemide 40 mg daily. Lower extremity edema has improved. Negative for orthopnea or PND. She is sleeping on 2 pillows, which is her baseline for years.   She attributes her recent health issues to stress due to her sister's illness.  She has history of Alzheimer's dementia and she was living with her but now she is in a nursing home facility.  She has been home since 01/04/2018. Her appetite is "good."   HH was arranged. She received a phone call but visit has not been arranged. Wheel chair, which she does not need now.  She is walking daily and feels like she is progressively improving. She feels like she is getting closer to her baseline.   Hypokalemia: Currently she is on potassium chloride 20 mEq daily.  Lab Results  Component Value Date   CREATININE 1.22 (H) 12/04/2017   BUN <5 (L) 12/04/2017   NA 144 12/04/2017   K 3.6 12/04/2017   CL 92 (L)  12/04/2017   CO2 44 (H) 12/04/2017    She wants to know if her cholesterol was checked during hospital admission.  Hyperlipidemia:Currently on atorvastatin 80 mg daily Following a low fat diet: Yes.  She has not noted side effects with medication.  Lab Results  Component Value Date   CHOL 165 09/12/2014   HDL 32 (L) 09/12/2014   LDLCALC 92 09/12/2014   LDLDIRECT 157.2 12/11/2009   TRIG 204 (H) 09/12/2014   CHOLHDL 5.2 09/12/2014    She is currently on supplemental oxygen 2 LPM. She is not quite sure why she is on supplemental oxygen, it was added after hospitalization. Dx of acute respiratory failure with hypoxia is listed on discharge summary. She states that she is not having dyspnea. Negative for chest pain or palpitations  She has history of COPD, she is no longer smoking.  Echo on 11/24/2017 showed LVEF 35 to 40%, elevated ventricular end-diastolic filling pressure and elevated left atrial filling pressure.   She has an appointment with Dr. Royann Shivers on 01/30/2018.  Anxiety: During hospitalization she was started on Lexapro 10 mg daily. She is tolerating medication well. Negative for suicidal thoughts and depressed mood.   Review of Systems  Constitutional: Positive for activity change and fatigue. Negative for appetite change and fever.  HENT: Negative for mouth sores, nosebleeds and trouble swallowing.   Eyes: Negative for redness and visual disturbance.  Respiratory: Positive for cough (occasional). Negative for  shortness of breath and wheezing.   Cardiovascular: Positive for leg swelling. Negative for chest pain and palpitations.  Gastrointestinal: Negative for abdominal pain, nausea and vomiting.       Negative for changes in bowel habits.  Genitourinary: Negative for decreased urine volume, dysuria and hematuria.  Musculoskeletal: Positive for gait problem.  Neurological: Negative for syncope, weakness and headaches.  Psychiatric/Behavioral: Negative for  confusion. The patient is nervous/anxious.       Current Outpatient Medications on File Prior to Visit  Medication Sig Dispense Refill  . acetaminophen (TYLENOL) 500 MG tablet Take 1,000 mg by mouth every 6 (six) hours as needed for mild pain.    [name] aspirin EC 81 MG EC tablet Take 1 tablet (81 mg total) by mouth daily.    [name] atorvastatin (LIPITOR) 80 MG tablet Take 1 tablet (80 mg total) by mouth daily at 6 PM. KEEP OV. 30 tablet 1  . busPIRone (BUSPAR) 7.5 MG tablet TAKE 1 TABLET TWICE A DAY FOR ANXIETY  0  . cetirizine (ZYRTEC) 10 MG tablet Take 10 mg by mouth daily.    [name] escitalopram (LEXAPRO) 10 MG tablet TAKE 1 TABLET EVERY DAY FOR DEPRESSION  0  . fenofibrate 54 MG tablet Take 1 tablet (54 mg total) by mouth daily.    . ferrous gluconate (FERGON) 324 MG tablet Take 1 tablet (324 mg total) by mouth 2 (two) times daily with a meal.  3  . folic acid (FOLVITE) 1 MG tablet Take 1 tablet (1 mg total) by mouth daily.    . furosemide (LASIX) 40 MG tablet Take 1 tablet (40 mg total) by mouth daily. 30 tablet   . midodrine (PROAMATINE) 5 MG tablet TAKE 1 TABLET 3 TIMES A DAY FOR HTN. TAKE WITH MEALS  0  . Multiple Vitamin (MULTIVITAMIN WITH MINERALS) TABS tablet Take 1 tablet by mouth daily.    . nebivolol (BYSTOLIC) 5 MG tablet Take 1 tablet (5 mg total) by mouth daily.    . nitroGLYCERIN (NITROSTAT) 0.4 MG SL tablet Place 1 tablet (0.4 mg total) under the tongue every 5 (five) minutes as needed for chest pain. 25 tablet 3  . potassium chloride 20 MEQ TBCR Take 20 mEq by mouth daily.    [name] PROAIR HFA 108 (90 Base) MCG/ACT inhaler INHALE PO Q 4 H PRF SOB / WHEEZING  0  . ranitidine (ZANTAC) 150 MG tablet Take 150 mg by mouth 2 (two) times daily.    [name] SPIRIVA HANDIHALER 18 MCG inhalation capsule INHALE 2 PUFFS ONE TIME A DAY FOR COPD  1  . triamcinolone (NASACORT ALLERGY 24HR) 55 MCG/ACT AERO nasal inhaler Place 2 sprays into the nose daily.    . vitamin B-12 1000 MCG tablet Take 1 tablet (1,000  mcg total) by mouth daily.     No current facility-administered medications on file prior to visit.      Past Medical History:  Diagnosis Date  . Allergy   . Asthma   . CAD (coronary artery disease)   . COPD (chronic obstructive pulmonary disease) (HCC)   . Depression   . GERD (gastroesophageal reflux disease)   . Hyperlipidemia   . Hypertension   . Myocardial infarction (HCC) 10/16/2013   No Known Allergies  Family History  Problem Relation Age of Onset  . Allergies Mother   . Breast cancer Mother   . Arthritis Mother   . Cancer Mother   . Hyperlipidemia Mother   . Hypertension Mother   .  Heart disease Father   . Heart disease Brother   . Arthritis Brother   . Cancer Brother   . Depression Brother   . Drug abuse Brother   . Hyperlipidemia Brother   . Heart attack Brother   . Rheum arthritis Brother   . Cancer Sister   . Dementia Sister     Social History   Socioeconomic History  . Marital status: Married    Spouse name: Not on file  . Number of children: Not on file  . Years of education: Not on file  . Highest education level: Not on file  Occupational History  . Not on file  Social Needs  . Financial resource strain: Not on file  . Food insecurity:    Worry: Not on file    Inability: Not on file  . Transportation needs:    Medical: Not on file    Non-medical: Not on file  Tobacco Use  . Smoking status: Former Smoker    Packs/day: 1.00    Years: 45.00    Pack years: 45.00    Last attempt to quit: 10/16/2013    Years since quitting: 4.2  . Smokeless tobacco: Never Used  Substance and Sexual Activity  . Alcohol use: Yes    Comment: occasional  . Drug use: No  . Sexual activity: Not Currently  Lifestyle  . Physical activity:    Days per week: Not on file    Minutes per session: Not on file  . Stress: Not on file  Relationships  . Social connections:    Talks on phone: Not on file    Gets together: Not on file    Attends religious service:  Not on file    Active member of club or organization: Not on file    Attends meetings of clubs or organizations: Not on file    Relationship status: Not on file  Other Topics Concern  . Not on file  Social History Narrative  . Not on file    Vitals:   01/08/18 1004  BP: 120/60  Pulse: 63  Resp: 12  Temp: 98.3 F (36.8 C)  SpO2: 97%    Body mass index is 29.74 kg/m.   Physical Exam  Nursing note and vitals reviewed. Constitutional: She is oriented to person, place, and time. She appears well-developed. No distress.  HENT:  Head: Normocephalic and atraumatic.  Mouth/Throat: Oropharynx is clear and moist. Mucous membranes are dry.  Edentulous.  Eyes: Pupils are equal, round, and reactive to light. Conjunctivae are normal.  Neck: No JVD present.  Cardiovascular: Normal rate and regular rhythm.  No murmur heard. Pulses:      Dorsalis pedis pulses are 2+ on the right side, and 2+ on the left side.  Respiratory: Effort normal and breath sounds normal. No respiratory distress.  On supplemental O2.  GI: Soft. She exhibits no mass. There is no hepatomegaly. There is no tenderness.  Musculoskeletal: She exhibits edema.  Lymphadenopathy:    She has no cervical adenopathy.  Neurological: She is alert and oriented to person, place, and time. She has normal strength.  Otherwise stable gait with no assistance.  Skin: Skin is warm. No abrasion and no rash noted. There is erythema.  LE erythema on pretibial areas,mainly around ankles. No tenderness. No ulcers.    Psychiatric: She has a normal mood and affect.  Well groomed, good eye contact.      ASSESSMENT AND PLAN:   [name] was seen today for  establish care.  Orders Placed This Encounter  Procedures  . DG Chest 2 View  . Basic metabolic panel  . Lipid panel  . CBC with Differential/Platelet    Lab Results  Component Value Date   CREATININE 1.28 (H) 01/08/2018   BUN 17 01/08/2018   NA 141 01/08/2018   K  4.7 01/08/2018   CL 95 (L) 01/08/2018   CO2 37 (H) 01/08/2018   Lab Results  Component Value Date   CHOL 170 01/08/2018   HDL 42.90 01/08/2018   LDLCALC 94 01/08/2018   LDLDIRECT 157.2 12/11/2009   TRIG 168.0 (H) 01/08/2018   CHOLHDL 4 01/08/2018     Venous stasis dermatitis of both lower extremities Recommend compression stockings and/or lower extremity elevation. Continue Furosemide 40 mg daily. Good skin care. OTC hydrocortisone twice daily as needed for skin pruritus. She will continue monitoring for worsening symptoms.  Essential hypertension Hypotensive upon admission. For now she will continue Bystolic 5 mg daily. Adequate hydration. Follow-up in 3 months.  Hyperkalemia No changes in current management, will follow labs done today and will give further recommendations accordingly.   Anxiety disorder, unspecified Improved with Lexapro 10 mg daily. No changes in current management. Follow-up in 3 months.  Acute respiratory failure with hypoxia (HCC) She is reporting improvement of dyspnea. Still on continues O2 2 LPM. No changes for now. CXR order place to follow on pulmonary edema.   Iron deficiency anemia No changes in iron supplementation. She has not colonoscopy done. Will follow anemia and when she is more stable before we further tests.   Hyperlipidemia, mixed No changes in current management, will follow labs done today and will give further recommendations accordingly.    Abnormal CXR -     DG Chest 2 View; Future       Betty G. Swaziland, MD  [facility]. [place] office.

## 2018-01-08 NOTE — Assessment & Plan Note (Signed)
Improved with Lexapro 10 mg daily. No changes in current management. Follow-up in 3 months.

## 2018-01-08 NOTE — Assessment & Plan Note (Signed)
Hypotensive upon admission. For now she will continue Bystolic 5 mg daily. Adequate hydration. Follow-up in 3 months.

## 2018-01-08 NOTE — Assessment & Plan Note (Signed)
No changes in iron supplementation. She has not colonoscopy done. Will follow anemia and when she is more stable before we further tests.

## 2018-01-08 NOTE — Assessment & Plan Note (Signed)
She is reporting improvement of dyspnea. Still on continues O2 2 LPM. No changes for now. CXR order place to follow on pulmonary edema.

## 2018-01-22 ENCOUNTER — Other Ambulatory Visit: Payer: Self-pay

## 2018-01-22 ENCOUNTER — Telehealth: Payer: Self-pay | Admitting: Family Medicine

## 2018-01-22 NOTE — Telephone Encounter (Signed)
Copied from CRM [id number]. Topic: Quick Communication - Rx Refill/Question >> Jan 22, 2018  1:33 PM Burchel, Abbi R wrote: ALL MEDS    Preferred Pharmacy: [id number] - [place], [place] - [address] AT [address] [facility] [address]  [phone] (Phone) [phone] (Fax)   PT CB#:[phone]  Pt states the rehab facility needs new Rx for all meds due to [facility]

## 2018-01-22 NOTE — Patient Outreach (Signed)
[facility]) Care Management  01/22/2018  Lyn[facility]ee [facility] 11-Feb-1946 [id number]   Medication Adherence call to Mrs. Beverly Wheeler patient did not answer patient is due Atorvastatin 80 mg. she is no longer taking Irbesartan 300 mg.Mrs. Recla is showing past due under [facility].  Lillia Abed CPhT Pharmacy Technician [facility] Management Direct Dial [phone]  Fax [phone] [email or web address]

## 2018-01-23 NOTE — Telephone Encounter (Signed)
Please advise. Sent to  [place] station in error looks like patient is seen at [facility]

## 2018-01-23 NOTE — Telephone Encounter (Signed)
Patient is request refill of all medications- she was seen as new patient 01/08/18. Dr Swaziland is not listed as prescriber on any of patient Rx. PCP needs to review and decide what medications she is going to be responsible for. Per visit 01/08/18- patient to continue medications pended.  LOV: 01/08/18  PCP: Swaziland  Pharmacy: verified

## 2018-01-24 ENCOUNTER — Other Ambulatory Visit (INDEPENDENT_AMBULATORY_CARE_PROVIDER_SITE_OTHER): Payer: Medicare Other

## 2018-01-24 ENCOUNTER — Ambulatory Visit (INDEPENDENT_AMBULATORY_CARE_PROVIDER_SITE_OTHER)
Admission: RE | Admit: 2018-01-24 | Discharge: 2018-01-24 | Disposition: A | Discharge status: Home / Self Care | Payer: Medicare Other | Source: Ambulatory Visit | Attending: Family Medicine | Admitting: Family Medicine

## 2018-01-24 DIAGNOSIS — R9389 Abnormal findings on diagnostic imaging of other specified body structures: Secondary | ICD-10-CM | POA: Diagnosis not present

## 2018-01-24 DIAGNOSIS — D509 Iron deficiency anemia, unspecified: Secondary | ICD-10-CM

## 2018-01-24 DIAGNOSIS — R918 Other nonspecific abnormal finding of lung field: Secondary | ICD-10-CM | POA: Diagnosis not present

## 2018-01-24 LAB — CBC WITH DIFFERENTIAL/PLATELET
BASOS PCT: 0.7 % (ref 0.0–3.0)
Basophils Absolute: 0.1 10*3/uL (ref 0.0–0.1)
EOS PCT: 7.4 % — AB (ref 0.0–5.0)
Eosinophils Absolute: 0.8 10*3/uL — ABNORMAL HIGH (ref 0.0–0.7)
HCT: 35.9 % — ABNORMAL LOW (ref 36.0–46.0)
HEMOGLOBIN: 11.8 g/dL — AB (ref 12.0–15.0)
LYMPHS ABS: 2.5 10*3/uL (ref 0.7–4.0)
Lymphocytes Relative: 23.2 % (ref 12.0–46.0)
MCHC: 32.9 g/dL (ref 30.0–36.0)
MCV: 93.8 fl (ref 78.0–100.0)
MONOS PCT: 5.6 % (ref 3.0–12.0)
Monocytes Absolute: 0.6 10*3/uL (ref 0.1–1.0)
NEUTROS PCT: 63.1 % (ref 43.0–77.0)
Neutro Abs: 6.7 10*3/uL (ref 1.4–7.7)
Platelets: 225 10*3/uL (ref 150.0–400.0)
RBC: 3.83 Mil/uL — AB (ref 3.87–5.11)
RDW: 13.7 % (ref 11.5–15.5)
WBC: 10.6 10*3/uL — ABNORMAL HIGH (ref 4.0–10.5)

## 2018-01-26 ENCOUNTER — Encounter: Payer: Self-pay | Admitting: Family Medicine

## 2018-01-26 ENCOUNTER — Other Ambulatory Visit: Payer: Self-pay | Admitting: Family Medicine

## 2018-01-26 MED ORDER — POTASSIUM CHLORIDE ER 20 MEQ PO TBCR: 20.0000 meq | EXTENDED_RELEASE_TABLET | Freq: Every day | ORAL | 0 refills | Status: DC

## 2018-01-26 MED ORDER — FUROSEMIDE 40 MG PO TABS: 40.0000 mg | ORAL_TABLET | Freq: Every day | ORAL | Status: DC

## 2018-01-26 MED ORDER — NEBIVOLOL HCL 5 MG PO TABS: 5.0000 mg | ORAL_TABLET | Freq: Every day | ORAL | 0 refills | Status: DC

## 2018-01-26 MED ORDER — FOLIC ACID 1 MG PO TABS: 1.0000 mg | ORAL_TABLET | Freq: Every day | ORAL | 3 refills | Status: DC

## 2018-01-26 MED ORDER — FERROUS GLUCONATE 324 (38 FE) MG PO TABS: 324.0000 mg | ORAL_TABLET | Freq: Two times a day (BID) | ORAL | 3 refills | Status: DC

## 2018-01-26 MED ORDER — PROAIR HFA 108 (90 BASE) MCG/ACT IN AERS: INHALATION_SPRAY | RESPIRATORY_TRACT | 3 refills | Status: DC

## 2018-01-26 MED ORDER — BUSPIRONE HCL 7.5 MG PO TABS: 7.5000 mg | ORAL_TABLET | Freq: Two times a day (BID) | ORAL | 0 refills | Status: DC

## 2018-01-26 MED ORDER — FENOFIBRATE 54 MG PO TABS: 54.0000 mg | ORAL_TABLET | Freq: Every day | ORAL | 0 refills | Status: DC

## 2018-01-26 MED ORDER — SPIRIVA HANDIHALER 18 MCG IN CAPS
18.0000 ug | ORAL_CAPSULE | Freq: Every day | RESPIRATORY_TRACT | 3 refills | Status: DC
Start: 2018-01-26 — End: 2019-10-15

## 2018-01-26 MED ORDER — ESCITALOPRAM OXALATE 10 MG PO TABS: ORAL_TABLET | ORAL | 0 refills | Status: DC

## 2018-01-26 NOTE — Telephone Encounter (Signed)
Message sent to Dr. Jordan for review and approval. 

## 2018-01-26 NOTE — Telephone Encounter (Signed)
Pt following up on this refill request. Asked pt to get her bottles and we went through Her meds together escitalopram (LEXAPRO) 10 MG tablet furosemide (LASIX) 40 MG tablet busPIRone (BUSPAR) 7.5 MG tablet  potassium chloride 20 MEQ TBCR fenofibrate 54 MG tablet folic acid (FOLVITE) 1 MG tablet PROAIR HFA 108 (90 Base) MCG/ACT inhaler SPIRIVA HANDIHALER 18 MCG inhalation capsule nebivolol (BYSTOLIC) 5 MG tablet midodrine (PROAMATINE) 5 MG tablet ferrous gluconate (FERGON) 324 MG tablet  30 day with refills  [id number] [place], [place] - [address] AT [address] [facility] [address] [phone] (Phone) [phone] (Fax)   Dr Swaziland has never filled these for scripts.  Will be all new for Dr Swaziland. Pt states she called Wed, and now will be out tomorrow,.

## 2018-01-26 NOTE — Telephone Encounter (Signed)
All prescriptions requested were sent to her pharmacy except midodrine.  Given her history of CAD and renal disease would prefer this medication to be managed by cardiologist.  3 months F/U to be scheduled as recommended.  Thanks, BJ

## 2018-01-27 DIAGNOSIS — R269 Unspecified abnormalities of gait and mobility: Secondary | ICD-10-CM | POA: Diagnosis not present

## 2018-01-27 DIAGNOSIS — M6281 Muscle weakness (generalized): Secondary | ICD-10-CM | POA: Diagnosis not present

## 2018-01-27 DIAGNOSIS — J9611 Chronic respiratory failure with hypoxia: Secondary | ICD-10-CM | POA: Diagnosis not present

## 2018-01-29 ENCOUNTER — Other Ambulatory Visit: Payer: Self-pay | Admitting: Family Medicine

## 2018-01-29 ENCOUNTER — Ambulatory Visit: Payer: Medicare Other | Admitting: Family Medicine

## 2018-01-29 DIAGNOSIS — Z0289 Encounter for other administrative examinations: Secondary | ICD-10-CM

## 2018-01-29 NOTE — Telephone Encounter (Signed)
Patient informed and verbalized understanding

## 2018-01-30 ENCOUNTER — Ambulatory Visit: Payer: Medicare Other | Admitting: Cardiovascular Disease

## 2018-02-01 ENCOUNTER — Ambulatory Visit (INDEPENDENT_AMBULATORY_CARE_PROVIDER_SITE_OTHER): Payer: Medicare Other | Admitting: Adult Health

## 2018-02-01 ENCOUNTER — Encounter: Payer: Self-pay | Admitting: Adult Health

## 2018-02-01 VITALS — BP 120/54 | HR 82 | Temp 98.5°F | Wt 160.0 lb

## 2018-02-01 DIAGNOSIS — J441 Chronic obstructive pulmonary disease with (acute) exacerbation: Secondary | ICD-10-CM

## 2018-02-01 DIAGNOSIS — J449 Chronic obstructive pulmonary disease, unspecified: Secondary | ICD-10-CM

## 2018-02-01 MED ORDER — PREDNISONE 10 MG PO TABS: ORAL_TABLET | ORAL | 0 refills | Status: DC

## 2018-02-01 MED ORDER — IPRATROPIUM-ALBUTEROL 0.5-2.5 (3) MG/3ML IN SOLN
3.0000 mL | Freq: Once | RESPIRATORY_TRACT | Status: AC
  Administered 2018-02-01: 3 mL via RESPIRATORY_TRACT

## 2018-02-01 MED ORDER — DOXYCYCLINE HYCLATE 100 MG PO CAPS: 100.0000 mg | ORAL_CAPSULE | Freq: Two times a day (BID) | ORAL | 0 refills | Status: DC

## 2018-02-01 NOTE — Progress Notes (Signed)
Subjective:    Patient ID: Beverly Wheeler, female    DOB: 02-01-1946, 72 y.o.   MRN: [id number]  HPI  72 year old female who  has a past medical history of Allergy, Asthma, CAD (coronary artery disease), COPD (chronic obstructive pulmonary disease) (HCC), Depression, GERD (gastroesophageal reflux disease), Hyperlipidemia, Hypertension, and Myocardial infarction (HCC) (10/16/2013).  She is a patient of Dr. Swaziland who I am seeing today for an acute issue of productive cough and shortness of breath. Her symptoms started about 7 days ago.   She denies any fever or chills and does not feel acutely ill.   Had recent chest xray on 01/24/2018 to follow up on pulmonary edema.   She continues to be on 2L via [place]  IMPRESSION: Interval resolution of previous right mid lung linear opacity. Lungs are clear and expanded today. Previous pleural effusions and basilar atelectasis have resolved. Cardiac enlargement.  She has been using mucinex without resolution.   Review of Systems See HPI   Past Medical History:  Diagnosis Date  . Allergy   . Asthma   . CAD (coronary artery disease)   . COPD (chronic obstructive pulmonary disease) (HCC)   . Depression   . GERD (gastroesophageal reflux disease)   . Hyperlipidemia   . Hypertension   . Myocardial infarction [facility]) 10/16/2013    Social History   Socioeconomic History  . Marital status: Widowed    Spouse name: Not on file  . Number of children: Not on file  . Years of education: Not on file  . Highest education level: Not on file  Occupational History  . Not on file  Social Needs  . Financial resource strain: Not on file  . Food insecurity:    Worry: Not on file    Inability: Not on file  . Transportation needs:    Medical: Not on file    Non-medical: Not on file  Tobacco Use  . Smoking status: Former Smoker    Packs/day: 1.00    Years: 45.00    Pack years: 45.00    Last attempt to quit: 10/16/2013    Years since quitting:  4.2  . Smokeless tobacco: Never Used  Substance and Sexual Activity  . Alcohol use: Yes    Comment: occasional  . Drug use: No  . Sexual activity: Not Currently  Lifestyle  . Physical activity:    Days per week: Not on file    Minutes per session: Not on file  . Stress: Not on file  Relationships  . Social connections:    Talks on phone: Not on file    Gets together: Not on file    Attends religious service: Not on file    Active member of club or organization: Not on file    Attends meetings of clubs or organizations: Not on file    Relationship status: Not on file  . Intimate partner violence:    Fear of current or ex partner: Not on file    Emotionally abused: Not on file    Physically abused: Not on file    Forced sexual activity: Not on file  Other Topics Concern  . Not on file  Social History Narrative  . Not on file    Past Surgical History:  Procedure Laterality Date  . CARDIAC CATHETERIZATION  10/17/13   significant 3 vessel disease  . LEFT HEART CATHETERIZATION WITH CORONARY ANGIOGRAM N/A 10/17/2013   Procedure: LEFT HEART CATHETERIZATION WITH CORONARY ANGIOGRAM;  Surgeon:  Lesleigh Noe, MD;  Location: [facility] CATH LAB;  Service: Cardiovascular;  Laterality: N/A;    Family History  Problem Relation Age of Onset  . Allergies Mother   . Breast cancer Mother   . Arthritis Mother   . Cancer Mother   . Hyperlipidemia Mother   . Hypertension Mother   . Heart disease Father   . Heart disease Brother   . Arthritis Brother   . Cancer Brother   . Depression Brother   . Drug abuse Brother   . Hyperlipidemia Brother   . Heart attack Brother   . Rheum arthritis Brother   . Cancer Sister   . Dementia Sister     No Known Allergies  Current Outpatient Medications on File Prior to Visit  Medication Sig Dispense Refill  . acetaminophen (TYLENOL) 500 MG tablet Take 1,000 mg by mouth every 6 (six) hours as needed for mild pain.    [name] aspirin EC 81 MG EC tablet Take  1 tablet (81 mg total) by mouth daily.    [name] atorvastatin (LIPITOR) 80 MG tablet Take 1 tablet (80 mg total) by mouth daily at 6 PM. KEEP OV. 30 tablet 1  . busPIRone (BUSPAR) 7.5 MG tablet Take 1 tablet (7.5 mg total) by mouth 2 (two) times daily. 180 tablet 0  . cetirizine (ZYRTEC) 10 MG tablet Take 10 mg by mouth daily.    [name] escitalopram (LEXAPRO) 10 MG tablet TAKE 1 TABLET EVERY DAY FOR DEPRESSION 90 tablet 0  . fenofibrate 54 MG tablet Take 1 tablet (54 mg total) by mouth daily. 90 tablet 0  . ferrous gluconate (FERGON) 324 MG tablet Take 1 tablet (324 mg total) by mouth 2 (two) times daily with a meal. 90 tablet 3  . folic acid (FOLVITE) 1 MG tablet Take 1 tablet (1 mg total) by mouth daily. 90 tablet 3  . furosemide (LASIX) 40 MG tablet Take 1 tablet (40 mg total) by mouth daily. 30 tablet   . midodrine (PROAMATINE) 5 MG tablet TAKE 1 TABLET 3 TIMES A DAY FOR HTN. TAKE WITH MEALS  0  . Multiple Vitamin (MULTIVITAMIN WITH MINERALS) TABS tablet Take 1 tablet by mouth daily.    . nebivolol (BYSTOLIC) 5 MG tablet Take 1 tablet (5 mg total) by mouth daily. 90 tablet 0  . nitroGLYCERIN (NITROSTAT) 0.4 MG SL tablet Place 1 tablet (0.4 mg total) under the tongue every 5 (five) minutes as needed for chest pain. 25 tablet 3  . Potassium Chloride ER 20 MEQ TBCR Take 20 mEq by mouth daily. 90 tablet 0  . PROAIR HFA 108 (90 Base) MCG/ACT inhaler INHALE PO Q 4 H PRF SOB / WHEEZING 18 g 3  . ranitidine (ZANTAC) 150 MG tablet Take 150 mg by mouth 2 (two) times daily.    [name] SPIRIVA HANDIHALER 18 MCG inhalation capsule Place 1 capsule (18 mcg total) into inhaler and inhale daily. 30 capsule 3  . triamcinolone (NASACORT ALLERGY 24HR) 55 MCG/ACT AERO nasal inhaler Place 2 sprays into the nose daily.    . vitamin B-12 1000 MCG tablet Take 1 tablet (1,000 mcg total) by mouth daily.     No current facility-administered medications on file prior to visit.     BP (!) 120/54   Pulse 82   Temp 98.5 F (36.9 C)    Wt 160 lb (72.6 kg)   SpO2 96% Comment: 2LPM  BMI 30.23 kg/m       Objective:  Physical Exam  Constitutional: She is oriented to person, place, and time. She appears well-developed and well-nourished.  Eyes: Pupils are equal, round, and reactive to light. EOM are normal.  Neck: Normal range of motion. Neck supple.  Cardiovascular: Normal rate, regular rhythm and normal heart sounds.  Pulmonary/Chest: Effort normal. No accessory muscle usage or stridor. No tachypnea. She has wheezes. She has rhonchi.  2l via [place]   Musculoskeletal: Normal range of motion.       Right lower leg: She exhibits no edema.       Left lower leg: She exhibits no edema.  Neurological: She is alert and oriented to person, place, and time.  Skin: Skin is warm and dry. Capillary refill takes less than 2 seconds.  Psychiatric: She has a normal mood and affect. Her behavior is normal.  Nursing note and vitals reviewed.     Assessment & Plan:  1. COPD  GOLD II based on fev1/VC of 58%  - Wheezing and rhonchi had resolved slightly after nebulizer. Patient reported feeling as though she was able to breath a little easier.  - ipratropium-albuterol (DUONEB) 0.5-2.5 (3) MG/3ML nebulizer solution 3 mL - doxycycline (VIBRAMYCIN) 100 MG capsule; Take 1 capsule (100 mg total) by mouth 2 (two) times daily.  Dispense: 14 capsule; Refill: 0 - predniSONE (DELTASONE) 10 MG tablet; 40 mg x 3 days, 20 mg x 3 days, 10 mg x 3 days  Dispense: 21 tablet; Refill: 0 - DG Chest 2 View; Future  Shirline Frees, NP

## 2018-02-02 ENCOUNTER — Ambulatory Visit (INDEPENDENT_AMBULATORY_CARE_PROVIDER_SITE_OTHER)
Admission: RE | Admit: 2018-02-02 | Discharge: 2018-02-02 | Disposition: A | Discharge status: Home / Self Care | Payer: Medicare Other | Source: Ambulatory Visit | Attending: Adult Health | Admitting: Adult Health

## 2018-02-02 ENCOUNTER — Other Ambulatory Visit: Payer: Self-pay | Admitting: *Deleted

## 2018-02-02 ENCOUNTER — Other Ambulatory Visit: Payer: Self-pay | Admitting: Family Medicine

## 2018-02-02 DIAGNOSIS — R0989 Other specified symptoms and signs involving the circulatory and respiratory systems: Secondary | ICD-10-CM | POA: Diagnosis not present

## 2018-02-02 DIAGNOSIS — J449 Chronic obstructive pulmonary disease, unspecified: Secondary | ICD-10-CM

## 2018-02-02 MED ORDER — FUROSEMIDE 40 MG PO TABS: 40.0000 mg | ORAL_TABLET | Freq: Every day | ORAL | 3 refills | Status: DC

## 2018-02-02 MED ORDER — FOLIC ACID 1 MG PO TABS: 1.0000 mg | ORAL_TABLET | Freq: Every day | ORAL | 3 refills | Status: DC

## 2018-02-02 NOTE — Telephone Encounter (Signed)
Folic acid and Lasix sent to pharmacy with receipt confirmed on 02/02/18. All other meds were sent and confirmed on 01/26/18.

## 2018-02-02 NOTE — Telephone Encounter (Unsigned)
Copied from CRM [id number]. Topic: Quick Communication - See Telephone Encounter >> Feb 02, 2018 11:29 AM Herby Abraham C wrote: CRM for notification. See Telephone encounter for: 02/02/18.  Pt called in stating that pharmacy didn't receive Rx(s) that were sent in on 01/26/18. I called the pharmacy and was advised of all medications that were received. Pharmacy states that they received all but Folic acid and potassium. Pt says that she was told by pharmacy that furosemide (LASIX) 40 MG  wasn't received, when spoke with pharmacy ,they confirmed receipt.    Please assist pt further.

## 2018-02-27 DIAGNOSIS — M6281 Muscle weakness (generalized): Secondary | ICD-10-CM | POA: Diagnosis not present

## 2018-02-27 DIAGNOSIS — J9611 Chronic respiratory failure with hypoxia: Secondary | ICD-10-CM | POA: Diagnosis not present

## 2018-02-27 DIAGNOSIS — R269 Unspecified abnormalities of gait and mobility: Secondary | ICD-10-CM | POA: Diagnosis not present

## 2018-03-30 DIAGNOSIS — J9611 Chronic respiratory failure with hypoxia: Secondary | ICD-10-CM | POA: Diagnosis not present

## 2018-03-30 DIAGNOSIS — R269 Unspecified abnormalities of gait and mobility: Secondary | ICD-10-CM | POA: Diagnosis not present

## 2018-03-30 DIAGNOSIS — M6281 Muscle weakness (generalized): Secondary | ICD-10-CM | POA: Diagnosis not present

## 2018-04-16 ENCOUNTER — Ambulatory Visit: Payer: Medicare Other | Admitting: Cardiovascular Disease

## 2018-04-22 ENCOUNTER — Other Ambulatory Visit: Payer: Self-pay | Admitting: Family Medicine

## 2018-04-24 ENCOUNTER — Other Ambulatory Visit: Payer: Self-pay | Admitting: Family Medicine

## 2018-04-29 DIAGNOSIS — R269 Unspecified abnormalities of gait and mobility: Secondary | ICD-10-CM | POA: Diagnosis not present

## 2018-04-29 DIAGNOSIS — M6281 Muscle weakness (generalized): Secondary | ICD-10-CM | POA: Diagnosis not present

## 2018-04-29 DIAGNOSIS — J9611 Chronic respiratory failure with hypoxia: Secondary | ICD-10-CM | POA: Diagnosis not present

## 2018-05-05 ENCOUNTER — Other Ambulatory Visit: Payer: Self-pay | Admitting: Family Medicine

## 2018-05-09 ENCOUNTER — Other Ambulatory Visit: Payer: Self-pay | Admitting: Family Medicine

## 2018-05-10 ENCOUNTER — Other Ambulatory Visit: Payer: Self-pay | Admitting: Family Medicine

## 2018-05-11 ENCOUNTER — Other Ambulatory Visit: Payer: Self-pay | Admitting: Family Medicine

## 2018-05-21 ENCOUNTER — Other Ambulatory Visit: Payer: Self-pay | Admitting: Family Medicine

## 2018-05-30 DIAGNOSIS — R269 Unspecified abnormalities of gait and mobility: Secondary | ICD-10-CM | POA: Diagnosis not present

## 2018-05-30 DIAGNOSIS — M6281 Muscle weakness (generalized): Secondary | ICD-10-CM | POA: Diagnosis not present

## 2018-05-30 DIAGNOSIS — J9611 Chronic respiratory failure with hypoxia: Secondary | ICD-10-CM | POA: Diagnosis not present

## 2018-06-08 ENCOUNTER — Ambulatory Visit: Payer: Medicare Other | Admitting: Cardiovascular Disease

## 2018-06-29 DIAGNOSIS — R269 Unspecified abnormalities of gait and mobility: Secondary | ICD-10-CM | POA: Diagnosis not present

## 2018-06-29 DIAGNOSIS — M6281 Muscle weakness (generalized): Secondary | ICD-10-CM | POA: Diagnosis not present

## 2018-06-29 DIAGNOSIS — J9611 Chronic respiratory failure with hypoxia: Secondary | ICD-10-CM | POA: Diagnosis not present

## 2018-07-20 ENCOUNTER — Other Ambulatory Visit: Payer: Self-pay | Admitting: Family Medicine

## 2018-07-26 ENCOUNTER — Other Ambulatory Visit: Payer: Self-pay | Admitting: Family Medicine

## 2018-07-30 DIAGNOSIS — R269 Unspecified abnormalities of gait and mobility: Secondary | ICD-10-CM | POA: Diagnosis not present

## 2018-07-30 DIAGNOSIS — M6281 Muscle weakness (generalized): Secondary | ICD-10-CM | POA: Diagnosis not present

## 2018-07-30 DIAGNOSIS — J9611 Chronic respiratory failure with hypoxia: Secondary | ICD-10-CM | POA: Diagnosis not present

## 2018-08-05 ENCOUNTER — Other Ambulatory Visit: Payer: Self-pay | Admitting: Family Medicine

## 2018-08-08 ENCOUNTER — Ambulatory Visit: Payer: Medicare Other | Admitting: Family Medicine

## 2018-08-08 ENCOUNTER — Other Ambulatory Visit: Payer: Self-pay | Admitting: Family Medicine

## 2018-08-10 ENCOUNTER — Encounter: Payer: Self-pay | Admitting: Family Medicine

## 2018-08-10 ENCOUNTER — Encounter: Payer: Self-pay | Admitting: *Deleted

## 2018-08-10 ENCOUNTER — Ambulatory Visit (INDEPENDENT_AMBULATORY_CARE_PROVIDER_SITE_OTHER): Payer: Medicare Other | Admitting: Family Medicine

## 2018-08-10 ENCOUNTER — Ambulatory Visit (INDEPENDENT_AMBULATORY_CARE_PROVIDER_SITE_OTHER): Payer: Medicare Other

## 2018-08-10 VITALS — BP 120/60 | HR 78 | Temp 98.7°F | Resp 16 | Ht 61.0 in | Wt 186.2 lb

## 2018-08-10 DIAGNOSIS — I10 Essential (primary) hypertension: Secondary | ICD-10-CM

## 2018-08-10 DIAGNOSIS — R9389 Abnormal findings on diagnostic imaging of other specified body structures: Secondary | ICD-10-CM | POA: Diagnosis not present

## 2018-08-10 DIAGNOSIS — E875 Hyperkalemia: Secondary | ICD-10-CM

## 2018-08-10 DIAGNOSIS — F419 Anxiety disorder, unspecified: Secondary | ICD-10-CM | POA: Diagnosis not present

## 2018-08-10 DIAGNOSIS — E782 Mixed hyperlipidemia: Secondary | ICD-10-CM

## 2018-08-10 DIAGNOSIS — I872 Venous insufficiency (chronic) (peripheral): Secondary | ICD-10-CM

## 2018-08-10 DIAGNOSIS — D509 Iron deficiency anemia, unspecified: Secondary | ICD-10-CM | POA: Diagnosis not present

## 2018-08-10 DIAGNOSIS — J449 Chronic obstructive pulmonary disease, unspecified: Secondary | ICD-10-CM

## 2018-08-10 DIAGNOSIS — R0602 Shortness of breath: Secondary | ICD-10-CM | POA: Diagnosis not present

## 2018-08-10 DIAGNOSIS — J9811 Atelectasis: Secondary | ICD-10-CM | POA: Diagnosis not present

## 2018-08-10 LAB — COMPREHENSIVE METABOLIC PANEL
ALT: 14 U/L (ref 0–35)
AST: 14 U/L (ref 0–37)
Albumin: 3.9 g/dL (ref 3.5–5.2)
Alkaline Phosphatase: 56 U/L (ref 39–117)
BUN: 13 mg/dL (ref 6–23)
CO2: 40 meq/L — AB (ref 19–32)
Calcium: 9.2 mg/dL (ref 8.4–10.5)
Chloride: 96 mEq/L (ref 96–112)
Creatinine, Ser: 1.12 mg/dL (ref 0.40–1.20)
GFR: 48.05 mL/min — AB (ref >60.00)
GLUCOSE: 192 mg/dL — AB (ref 70–99)
POTASSIUM: 4.4 meq/L (ref 3.5–5.1)
SODIUM: 142 meq/L (ref 135–145)
Total Bilirubin: 0.3 mg/dL (ref 0.2–1.2)
Total Protein: 6.3 g/dL (ref 6.0–8.3)

## 2018-08-10 LAB — LIPID PANEL
Cholesterol: 243 mg/dL — ABNORMAL HIGH (ref 0–200)
HDL: 36.3 mg/dL — ABNORMAL LOW (ref >39.00)
NONHDL: 207.1
Total CHOL/HDL Ratio: 7
Triglycerides: 347 mg/dL — ABNORMAL HIGH (ref 0.0–149.0)
VLDL: 69.4 mg/dL — ABNORMAL HIGH (ref 0.0–40.0)

## 2018-08-10 LAB — CBC
HEMATOCRIT: 33.1 % — AB (ref 36.0–46.0)
HEMOGLOBIN: 11.4 g/dL — AB (ref 12.0–15.0)
MCHC: 34.3 g/dL (ref 30.0–36.0)
MCV: 96 fl (ref 78.0–100.0)
PLATELETS: 190 10*3/uL (ref 150.0–400.0)
RBC: 3.45 Mil/uL — ABNORMAL LOW (ref 3.87–5.11)
RDW: 12.4 % (ref 11.5–15.5)
WBC: 7.1 10*3/uL (ref 4.0–10.5)

## 2018-08-10 LAB — LDL CHOLESTEROL, DIRECT: Direct LDL: 145 mg/dL

## 2018-08-10 MED ORDER — ESCITALOPRAM OXALATE 10 MG PO TABS: 15.0000 mg | ORAL_TABLET | Freq: Every day | ORAL | 0 refills | Status: DC

## 2018-08-10 NOTE — Assessment & Plan Note (Signed)
We discussed some side effects of furosemide. She does not want to try compression stockings. Recommend lower extremity elevation above waist level a few times per day. OTC chestnut seed extract may help. Good skin care.

## 2018-08-10 NOTE — Assessment & Plan Note (Signed)
For now she will continue K-Lor 20 meq daily. Treatment will be adjusted according to BMP results.

## 2018-08-10 NOTE — Assessment & Plan Note (Signed)
After discussion of other pharmacologic options, she would like to continue Lexapro, she just got a 25-month supply.  So she will increase Lexapro dose from 10 mg to 15 mg daily. Instructed about warning signs. Follow-up in 3 months, before if needed.

## 2018-08-10 NOTE — Assessment & Plan Note (Signed)
No changes in current management.  She needs colonoscopy, last visit she had just been discharged from hospital,so she wanted to wait. Will follow labs done today and will give further recommendations accordingly.

## 2018-08-10 NOTE — Assessment & Plan Note (Signed)
BP adequately controlled. No changes in current management. Continue low salt diet. 

## 2018-08-10 NOTE — Assessment & Plan Note (Signed)
Symptoms are stable. No changes in Spiriva. Continue supplemental O2 as needed during the day and continues through the night.

## 2018-08-10 NOTE — Progress Notes (Signed)
HPI:  Chief Complaint  Patient presents with  . Follow-up    Beverly Wheeler is a 73 y.o. female, who is here today with her son for chronic problem management.  Hypertension: Currently she is on Bystolic 5 mg daily. She is checking BP at home, usually 120s to 140/60-70s. She has not noted unusual headache, chest pain, palpitations, or worsening lower extremity edema.  Venous stasis dermatitis, she is on furosemide 40 mg, which she is not taking daily because sometimes she feels "dehydrated." Hypokalemia, she is on K-Lor 20 mEq daily, she is taking it just when she takes day furosemide. She stated furosemide 6 days out of the week. Lower extremity edema seems to be better in the morning and worse at the end of the day with prolonged walking, standing. She has not tolerated compression stockings.   Lab Results  Component Value Date   CREATININE 1.28 (H) 01/08/2018   BUN 17 01/08/2018   NA 141 01/08/2018   K 4.7 01/08/2018   CL 95 (L) 01/08/2018   CO2 37 (H) 01/08/2018   Iron deficiency anemia, currently she is on iron supplementation. She has not noted gross hematuria, blood in the stool, or melena. She has not had colon cancer screening done.  Lab Results  Component Value Date   WBC 10.6 (H) 01/24/2018   HGB 11.8 (L) 01/24/2018   HCT 35.9 (L) 01/24/2018   MCV 93.8 01/24/2018   PLT 225.0 01/24/2018    COPD: Currently she is on Spiriva 1 capsule daily. Exertional dyspnea, pulse ox 90% upon exertion with O2 supplementation (2 LPM), at rest 95%. Negative for cough and wheezing.  Since her last visit she was evaluated for acute respiratory symptoms. CXR done on 02/02/2017 showed right middle lobe ill-defined opacity as described.    Hyperlipidemia: Currently on atorvastatin 80 mg daily and fenofibrate 54 mg daily. Not consistent with low-fat diet. She has tolerated medication well, denies side effects.   Lab Results  Component Value Date   CHOL 170 01/08/2018   HDL 42.90 01/08/2018   LDLCALC 94 01/08/2018   LDLDIRECT 157.2 12/11/2009   TRIG 168.0 (H) 01/08/2018   CHOLHDL 4 01/08/2018   Anxiety and depression: Currently she is on Lexapro 10 mg daily, she wonders if dose needs to be increased or if she needs to switch to a different medication. Reporting depression as a stable. Feeling more anxious lately. She attributes problem exacerbation to sisters health issues,dementia. She denies suicidal thoughts. She is also on BuSpar 7.5 mg twice daily. Insomnia, difficulty falling asleep and waking up a few times during the night to go to the bathroom.   Review of Systems  Constitutional: Positive for fatigue. Negative for activity change, appetite change and fever.  HENT: Negative for mouth sores, nosebleeds and trouble swallowing.   Eyes: Negative for redness and visual disturbance.  Respiratory: Positive for shortness of breath. Negative for cough and wheezing.   Cardiovascular: Positive for leg swelling (stable). Negative for chest pain and palpitations.  Gastrointestinal: Negative for abdominal pain, nausea and vomiting.       Negative for changes in bowel habits.  Genitourinary: Negative for decreased urine volume, dysuria and hematuria.  Neurological: Negative for syncope, weakness and headaches.  Psychiatric/Behavioral: Positive for sleep disturbance. Negative for confusion. The patient is nervous/anxious.       Current Outpatient Medications on File Prior to Visit  Medication Sig Dispense Refill  . acetaminophen (TYLENOL) 500 MG tablet Take  1,000 mg by mouth every 6 (six) hours as needed for mild pain.    [name] aspirin EC 81 MG EC tablet Take 1 tablet (81 mg total) by mouth daily.    [name] atorvastatin (LIPITOR) 80 MG tablet Take 1 tablet (80 mg total) by mouth daily at 6 PM. KEEP OV. 30 tablet 1  . busPIRone (BUSPAR) 7.5 MG tablet TAKE 1 TABLET BY MOUTH TWICE A DAY 180 tablet 0  . BYSTOLIC 5 MG tablet TAKE 1 TABLET BY  MOUTH EVERY DAY 30 tablet 2  . cetirizine (ZYRTEC) 10 MG tablet Take 10 mg by mouth daily.    . fenofibrate 54 MG tablet TAKE 1 TABLET BY MOUTH EVERY DAY 30 tablet 0  . ferrous gluconate (FERGON) 324 MG tablet TAKE 1 TABLET BY MOUTH TWICE A DAY WITH A MEAL 180 tablet 1  . folic acid (FOLVITE) 1 MG tablet Take 1 tablet (1 mg total) by mouth daily. 90 tablet 3  . furosemide (LASIX) 40 MG tablet TAKE 1 TABLET BY MOUTH EVERY DAY 30 tablet 3  . KLOR-CON M20 20 MEQ tablet TAKE 1 TABLET BY MOUTH EVERY DAY 90 tablet 1  . midodrine (PROAMATINE) 5 MG tablet TAKE 1 TABLET 3 TIMES A DAY FOR HTN. TAKE WITH MEALS  0  . Multiple Vitamin (MULTIVITAMIN WITH MINERALS) TABS tablet Take 1 tablet by mouth daily.    . nitroGLYCERIN (NITROSTAT) 0.4 MG SL tablet Place 1 tablet (0.4 mg total) under the tongue every 5 (five) minutes as needed for chest pain. 25 tablet 3  . predniSONE (DELTASONE) 10 MG tablet 40 mg x 3 days, 20 mg x 3 days, 10 mg x 3 days 21 tablet 0  . PROAIR HFA 108 (90 Base) MCG/ACT inhaler INHALE 1 PUFF EVERY 4 HOURS AS NEEDED FOR WHEEZE OR FOR SHORTNESS OF BREATH 8.5 Inhaler 3  . ranitidine (ZANTAC) 150 MG tablet Take 150 mg by mouth 2 (two) times daily.    [name] SPIRIVA HANDIHALER 18 MCG inhalation capsule Place 1 capsule (18 mcg total) into inhaler and inhale daily. 30 capsule 3  . triamcinolone (NASACORT ALLERGY 24HR) 55 MCG/ACT AERO nasal inhaler Place 2 sprays into the nose daily.    . vitamin B-12 1000 MCG tablet Take 1 tablet (1,000 mcg total) by mouth daily.     No current facility-administered medications on file prior to visit.      Past Medical History:  Diagnosis Date  . Allergy   . Asthma   . CAD (coronary artery disease)   . COPD (chronic obstructive pulmonary disease) (HCC)   . Depression   . GERD (gastroesophageal reflux disease)   . Hyperlipidemia   . Hypertension   . Myocardial infarction (HCC) 10/16/2013   No Known Allergies  Social History   Socioeconomic History  .  Marital status: Widowed    Spouse name: Not on file  . Number of children: Not on file  . Years of education: Not on file  . Highest education level: Not on file  Occupational History  . Not on file  Social Needs  . Financial resource strain: Not on file  . Food insecurity:    Worry: Not on file    Inability: Not on file  . Transportation needs:    Medical: Not on file    Non-medical: Not on file  Tobacco Use  . Smoking status: Former Smoker    Packs/day: 1.00    Years: 45.00    Pack years: 45.00  Last attempt to quit: 10/16/2013    Years since quitting: 4.8  . Smokeless tobacco: Never Used  Substance and Sexual Activity  . Alcohol use: Yes    Comment: occasional  . Drug use: No  . Sexual activity: Not Currently  Lifestyle  . Physical activity:    Days per week: Not on file    Minutes per session: Not on file  . Stress: Not on file  Relationships  . Social connections:    Talks on phone: Not on file    Gets together: Not on file    Attends religious service: Not on file    Active member of club or organization: Not on file    Attends meetings of clubs or organizations: Not on file    Relationship status: Not on file  Other Topics Concern  . Not on file  Social History Narrative  . Not on file    Vitals:   08/10/18 1200  BP: 120/60  Pulse: 78  Resp: 16  Temp: 98.7 F (37.1 C)  SpO2: 94%   Body mass index is 35.19 kg/m.   Physical Exam  Nursing note and vitals reviewed. Constitutional: She is oriented to person, place, and time. She appears well-developed. No distress.  HENT:  Head: Normocephalic and atraumatic.  Mouth/Throat: Oropharynx is clear and moist and mucous membranes are normal.  Eyes: Pupils are equal, round, and reactive to light. Conjunctivae are normal.  Cardiovascular: Normal rate and regular rhythm.  No murmur heard. Pulses:      Dorsalis pedis pulses are 2+ on the right side and 2+ on the left side.  Respiratory: Effort normal and  breath sounds normal. No respiratory distress.  GI: Soft. She exhibits no mass. There is no hepatomegaly. There is no abdominal tenderness.  Musculoskeletal:        General: Edema (1+ pitting LE edema, bilateral) present.  Lymphadenopathy:    She has no cervical adenopathy.  Neurological: She is alert and oriented to person, place, and time. She has normal strength. No cranial nerve deficit.  Stable gait, not assisted.  Skin: Skin is warm. No erythema.  Psychiatric: She has a normal mood and affect.  Well groomed, good eye contact.      ASSESSMENT AND PLAN:  Ms. Mylove was seen today for follow-up.  Orders Placed This Encounter  Procedures  . DG Chest 2 View  . Comprehensive metabolic panel  . CBC  . Lipid panel  . LDL cholesterol, direct   Lab Results  Component Value Date   CHOL 243 (H) 08/10/2018   HDL 36.30 (L) 08/10/2018   LDLCALC 94 01/08/2018   LDLDIRECT 145.0 08/10/2018   TRIG 347.0 (H) 08/10/2018   CHOLHDL 7 08/10/2018   Lab Results  Component Value Date   WBC 7.1 08/10/2018   HGB 11.4 (L) 08/10/2018   HCT 33.1 (L) 08/10/2018   MCV 96.0 08/10/2018   PLT 190.0 08/10/2018   Lab Results  Component Value Date   ALT 14 08/10/2018   AST 14 08/10/2018   ALKPHOS 56 08/10/2018   BILITOT 0.3 08/10/2018   Lab Results  Component Value Date   CREATININE 1.12 08/10/2018   BUN 13 08/10/2018   NA 142 08/10/2018   K 4.4 08/10/2018   CL 96 08/10/2018   CO2 40 (H) 08/10/2018     Hyperlipidemia, mixed No changes in atorvastatin or fenofibrate. Low-fat diet also recommended. Further recommendation will be given according to lab results, may consider stopping fenofibrate.  Iron  deficiency anemia No changes in current management.  She needs colonoscopy, last visit she had just been discharged from hospital,so she wanted to wait. Will follow labs done today and will give further recommendations accordingly.    Essential hypertension BP adequately  controlled. No changes in current management. Continue low-salt diet  Anxiety disorder, unspecified After discussion of other pharmacologic options, she would like to continue Lexapro, she just got a 3553-month supply.  So she will increase Lexapro dose from 10 mg to 15 mg daily. Instructed about warning signs. Follow-up in 3 months, before if needed.   Hyperkalemia For now she will continue K-Lor 20 meq daily. Treatment will be adjusted according to BMP results.  Venous stasis dermatitis of both lower extremities We discussed some side effects of furosemide. She does not want to try compression stockings. Recommend lower extremity elevation above waist level a few times per day. OTC chestnut seed extract may help. Good skin care.  COPD  GOLD II based on fev1/VC of 58%  Symptoms are stable. No changes in Spiriva. Continue supplemental O2 as needed during the day and continues through the night.   Abnormal chest x-ray Further recommendations will be given according to imaging results.  -     DG Chest 2 View    Return in about 3 months (around 11/09/2018) for ddepression,anxiety.       Kammie Scioli G. [place], MD  [facility]. [place] office.

## 2018-08-10 NOTE — Assessment & Plan Note (Signed)
No changes in atorvastatin or fenofibrate. Low-fat diet also recommended. Further recommendation will be given according to lab results, may consider stopping fenofibrate.

## 2018-08-10 NOTE — Patient Instructions (Signed)
A few things to remember from today's visit:   Anxiety disorder, unspecified type - Plan: escitalopram (LEXAPRO) 10 MG tablet  Venous stasis dermatitis of both lower extremities  Essential hypertension - Plan: Comprehensive metabolic panel  Iron deficiency anemia, unspecified iron deficiency anemia type - Plan: CBC  Hyperlipidemia, mixed - Plan: Comprehensive metabolic panel, Lipid panel  Hyperkalemia - Plan: Comprehensive metabolic panel  COPD  GOLD II based on fev1/VC of 58%   Abnormal chest x-ray - Plan: DG Chest 2 View  Lexapro increased from 10 mg to 15 mg, 1.5 tablet daily. No changes in rest of your medications.  Please be sure medication list is accurate. If a new problem present, please set up appointment sooner than planned today.

## 2018-08-12 ENCOUNTER — Encounter: Payer: Self-pay | Admitting: Family Medicine

## 2018-08-13 ENCOUNTER — Other Ambulatory Visit: Payer: Self-pay | Admitting: *Deleted

## 2018-08-13 MED ORDER — ATORVASTATIN CALCIUM 80 MG PO TABS: 80.0000 mg | ORAL_TABLET | Freq: Every day | ORAL | 1 refills | Status: DC

## 2018-08-22 ENCOUNTER — Telehealth: Payer: Self-pay | Admitting: *Deleted

## 2018-08-22 NOTE — Telephone Encounter (Signed)
Copied from CRM [id number]. Topic: General - Other >> Aug 21, 2018 12:16 PM [name], NT wrote: Reason for CRM: Pt states that the dismissal for jury duty letter Dr. [place] sent was not accepted by the court for lack of documentation of why she cannot serve. She states that documentation may need to be sent. >> Aug 21, 2018  4:50 PM [name] wrote: Patient is calling back.  >> Aug 22, 2018 12:24 PM [name] wrote: Patient is checking to see if the letter she requested to remove her from jury duty is available for her to pick up.  Patient states that she needs it today because she has to get it to the courthouse as soon as possible.  Please advise and call patient when it is ready.  CB# [phone]

## 2018-08-23 ENCOUNTER — Encounter: Payer: Self-pay | Admitting: *Deleted

## 2018-08-23 NOTE — Telephone Encounter (Signed)
Patient's son picked up letter

## 2018-08-23 NOTE — Telephone Encounter (Signed)
Spoke with patient on 08/22/2018 and discussed information that needed to put in letter. Patient stated that she will pick up new letter around 2 pm on 08/23/2018.

## 2018-08-30 DIAGNOSIS — J9611 Chronic respiratory failure with hypoxia: Secondary | ICD-10-CM | POA: Diagnosis not present

## 2018-08-30 DIAGNOSIS — R269 Unspecified abnormalities of gait and mobility: Secondary | ICD-10-CM | POA: Diagnosis not present

## 2018-08-30 DIAGNOSIS — M6281 Muscle weakness (generalized): Secondary | ICD-10-CM | POA: Diagnosis not present

## 2018-08-31 ENCOUNTER — Ambulatory Visit: Payer: Medicare Other | Admitting: Cardiovascular Disease

## 2018-09-18 ENCOUNTER — Other Ambulatory Visit: Payer: Self-pay | Admitting: Family Medicine

## 2018-09-18 DIAGNOSIS — F419 Anxiety disorder, unspecified: Secondary | ICD-10-CM

## 2018-09-28 DIAGNOSIS — J9611 Chronic respiratory failure with hypoxia: Secondary | ICD-10-CM | POA: Diagnosis not present

## 2018-09-28 DIAGNOSIS — R269 Unspecified abnormalities of gait and mobility: Secondary | ICD-10-CM | POA: Diagnosis not present

## 2018-09-28 DIAGNOSIS — M6281 Muscle weakness (generalized): Secondary | ICD-10-CM | POA: Diagnosis not present

## 2018-10-20 ENCOUNTER — Other Ambulatory Visit: Payer: Self-pay | Admitting: Family Medicine

## 2018-10-29 DIAGNOSIS — R269 Unspecified abnormalities of gait and mobility: Secondary | ICD-10-CM | POA: Diagnosis not present

## 2018-10-29 DIAGNOSIS — M6281 Muscle weakness (generalized): Secondary | ICD-10-CM | POA: Diagnosis not present

## 2018-10-29 DIAGNOSIS — J9611 Chronic respiratory failure with hypoxia: Secondary | ICD-10-CM | POA: Diagnosis not present

## 2018-10-30 ENCOUNTER — Other Ambulatory Visit: Payer: Self-pay

## 2018-10-30 NOTE — Patient Outreach (Signed)
[facility]) Care Management  10/30/2018  Leilanni Liggins [facility] 1946-06-21 [id number]   Medication Adherence call to Mrs. Leonie Green patient's telephone number is disconnected patient is due on Atorvastatin 80 mg under [facility].   Lillia Abed CPhT Pharmacy Technician [facility] Care Management Direct Dial [phone]  Fax [phone] [email or web address]

## 2018-11-09 ENCOUNTER — Ambulatory Visit: Payer: Medicare Other | Admitting: Family Medicine

## 2018-11-16 ENCOUNTER — Other Ambulatory Visit: Payer: Self-pay | Admitting: Family Medicine

## 2018-11-28 DIAGNOSIS — J9611 Chronic respiratory failure with hypoxia: Secondary | ICD-10-CM | POA: Diagnosis not present

## 2018-11-28 DIAGNOSIS — R269 Unspecified abnormalities of gait and mobility: Secondary | ICD-10-CM | POA: Diagnosis not present

## 2018-11-28 DIAGNOSIS — M6281 Muscle weakness (generalized): Secondary | ICD-10-CM | POA: Diagnosis not present

## 2018-11-30 ENCOUNTER — Other Ambulatory Visit: Payer: Self-pay | Admitting: Family Medicine

## 2018-11-30 DIAGNOSIS — F419 Anxiety disorder, unspecified: Secondary | ICD-10-CM

## 2018-12-05 ENCOUNTER — Other Ambulatory Visit: Payer: Self-pay | Admitting: Cardiovascular Disease

## 2018-12-06 ENCOUNTER — Ambulatory Visit: Payer: Medicare Other | Admitting: Cardiovascular Disease

## 2018-12-12 ENCOUNTER — Other Ambulatory Visit: Payer: Self-pay | Admitting: Family Medicine

## 2018-12-29 DIAGNOSIS — R269 Unspecified abnormalities of gait and mobility: Secondary | ICD-10-CM | POA: Diagnosis not present

## 2018-12-29 DIAGNOSIS — J9611 Chronic respiratory failure with hypoxia: Secondary | ICD-10-CM | POA: Diagnosis not present

## 2018-12-29 DIAGNOSIS — M6281 Muscle weakness (generalized): Secondary | ICD-10-CM | POA: Diagnosis not present

## 2019-01-01 IMAGING — DX DG CHEST 1V PORT
1 series · 1 of 1 positions shown · non-contrast
Comparison: November 28, 2017

CLINICAL DATA: Shortness of breath

EXAM:
PORTABLE CHEST 1 VIEW

[chest ap]
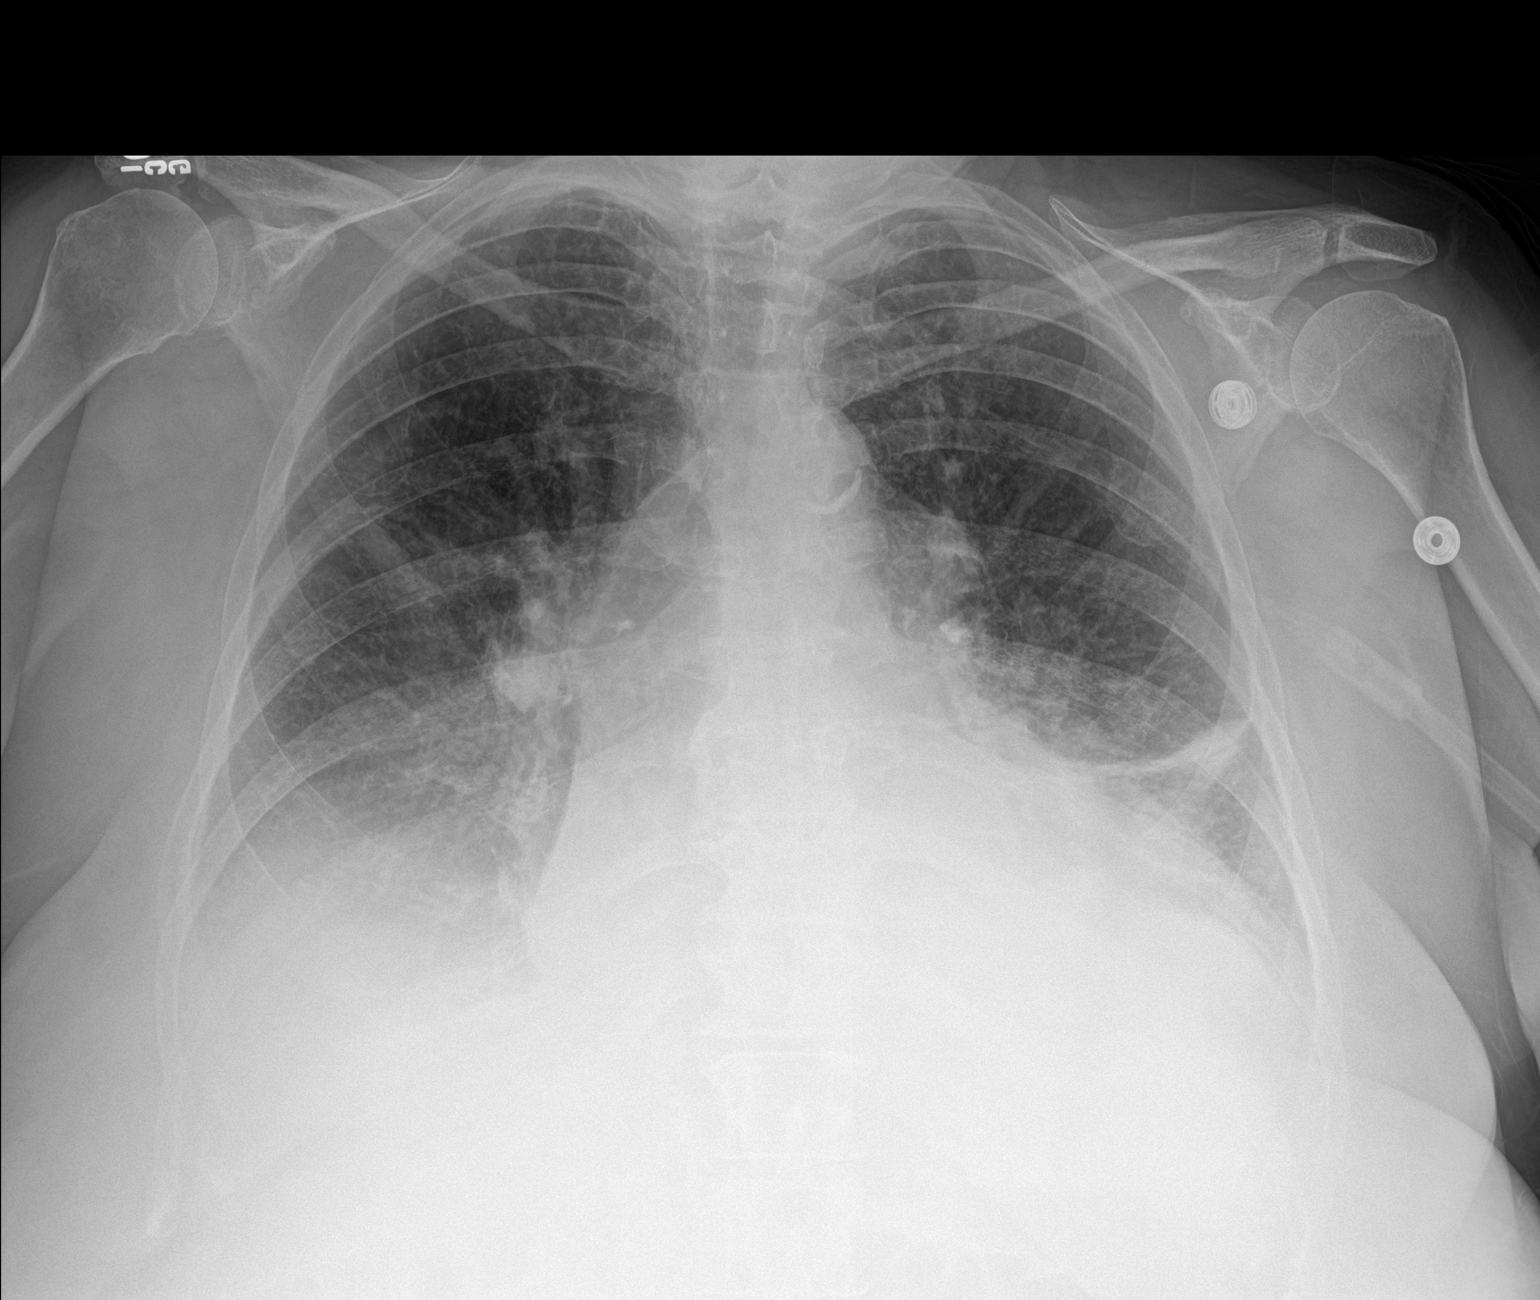

[1 of 1 positions shown; findings below may reference images not displayed]

FINDINGS: Persistent layering effusion on the right. Increasing opacity in
left base, possibly atelectasis versus infiltrate. Stable
cardiomegaly. The hila and mediastinum are unchanged. Mild increased
opacity in the right mid lung.
IMPRESSION: 1. Stable effusion and opacity in the right base.
2. Mild increased opacity in the right mid lung.
3. Increasing opacity in left base may represent atelectasis versus
infiltrate.

## 2019-01-19 ENCOUNTER — Other Ambulatory Visit: Payer: Self-pay | Admitting: Family Medicine

## 2019-01-26 ENCOUNTER — Other Ambulatory Visit: Payer: Self-pay | Admitting: Family Medicine

## 2019-01-26 DIAGNOSIS — F419 Anxiety disorder, unspecified: Secondary | ICD-10-CM

## 2019-01-28 DIAGNOSIS — R269 Unspecified abnormalities of gait and mobility: Secondary | ICD-10-CM | POA: Diagnosis not present

## 2019-01-28 DIAGNOSIS — J9611 Chronic respiratory failure with hypoxia: Secondary | ICD-10-CM | POA: Diagnosis not present

## 2019-01-28 DIAGNOSIS — M6281 Muscle weakness (generalized): Secondary | ICD-10-CM | POA: Diagnosis not present

## 2019-02-03 ENCOUNTER — Other Ambulatory Visit: Payer: Self-pay | Admitting: Family Medicine

## 2019-02-05 ENCOUNTER — Encounter

## 2019-02-05 ENCOUNTER — Ambulatory Visit: Payer: Medicare Other | Admitting: Cardiovascular Disease

## 2019-02-07 ENCOUNTER — Other Ambulatory Visit: Payer: Self-pay | Admitting: Family Medicine

## 2019-02-14 ENCOUNTER — Other Ambulatory Visit: Payer: Self-pay | Admitting: Family Medicine

## 2019-02-23 IMAGING — DX DG CHEST 2V
2 series · 2 of 2 positions shown · non-contrast
Comparison: 12/02/2017

CLINICAL DATA: Follow up on infiltration. No chest complaints.
History of hypertension, asthma, COPD, coronary artery disease,
previous smoker.

EXAM:
CHEST - 2 VIEW

[chest pa]
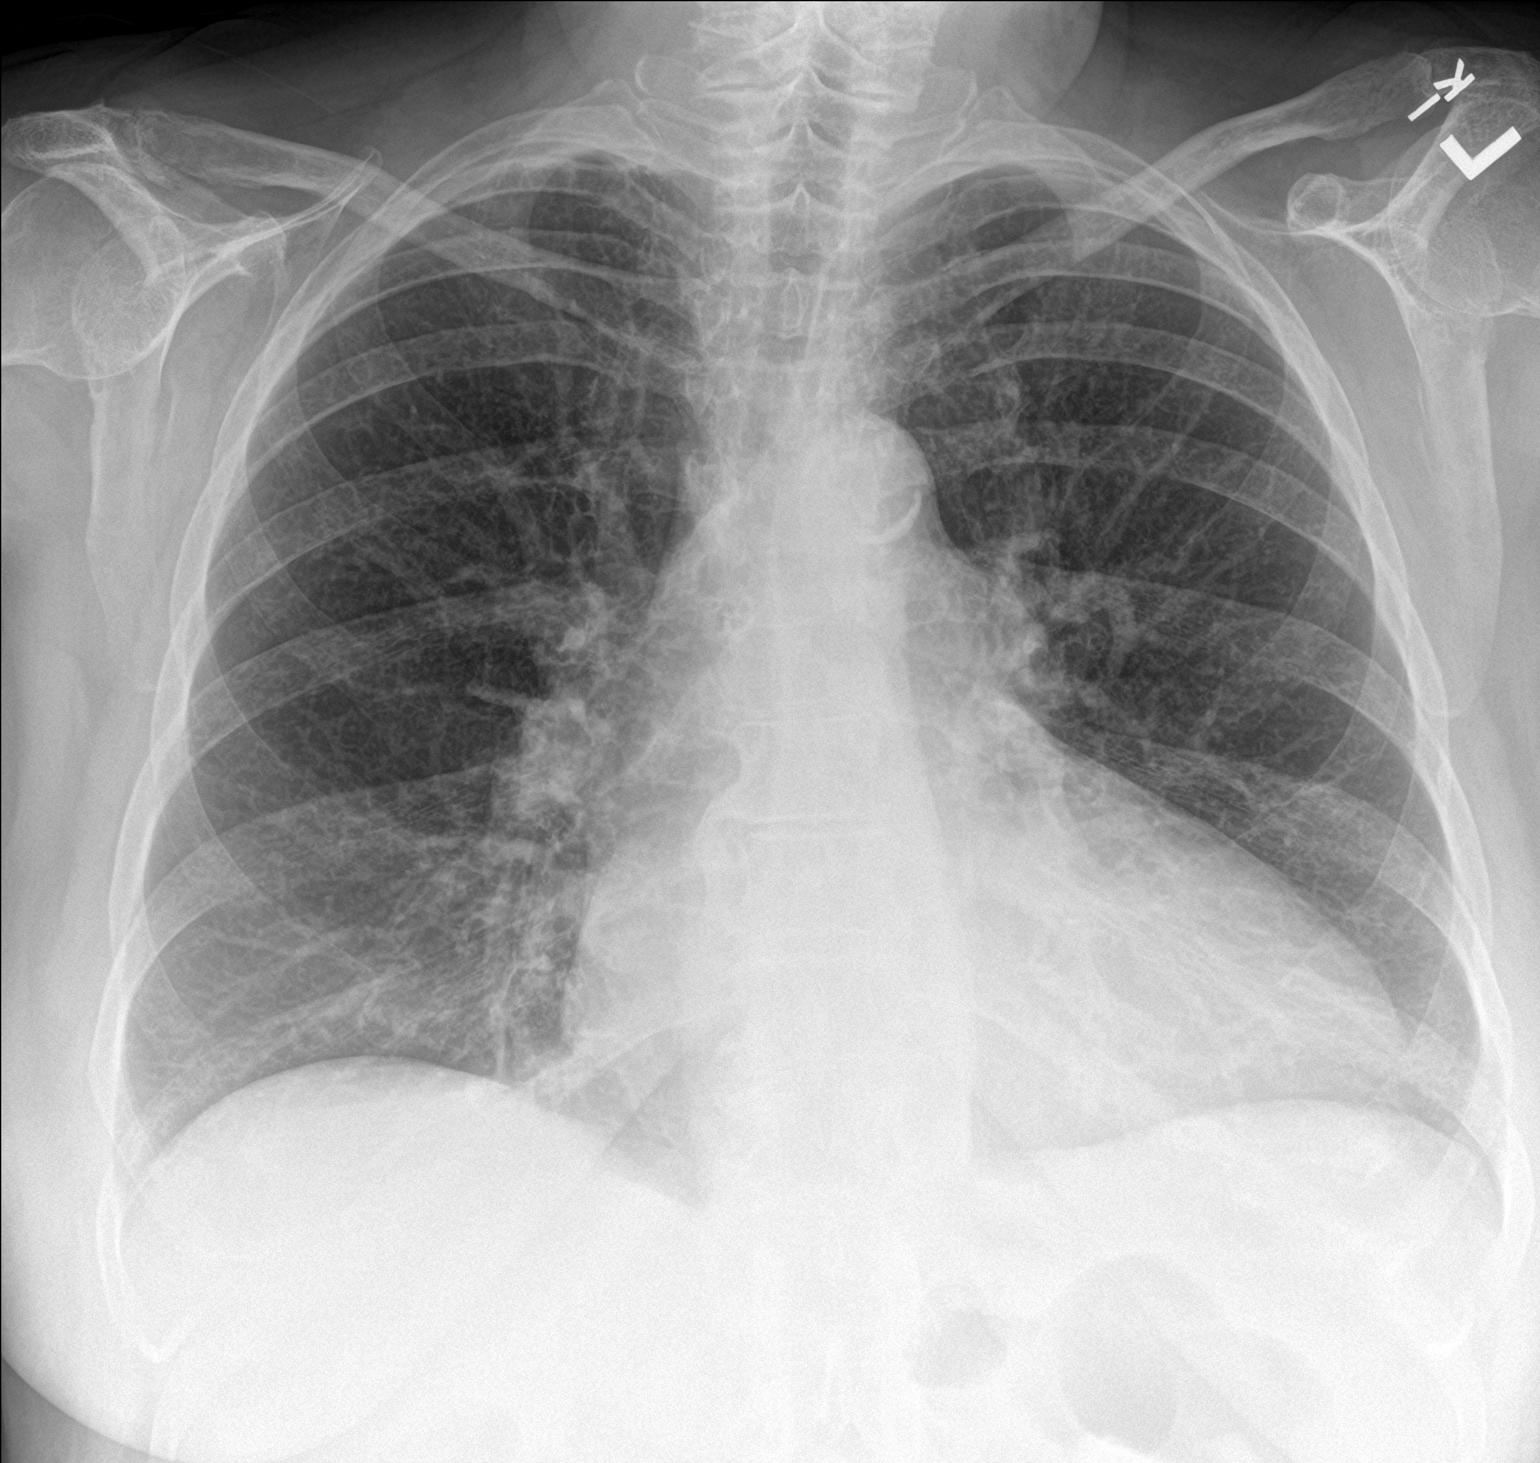

[chest lat]
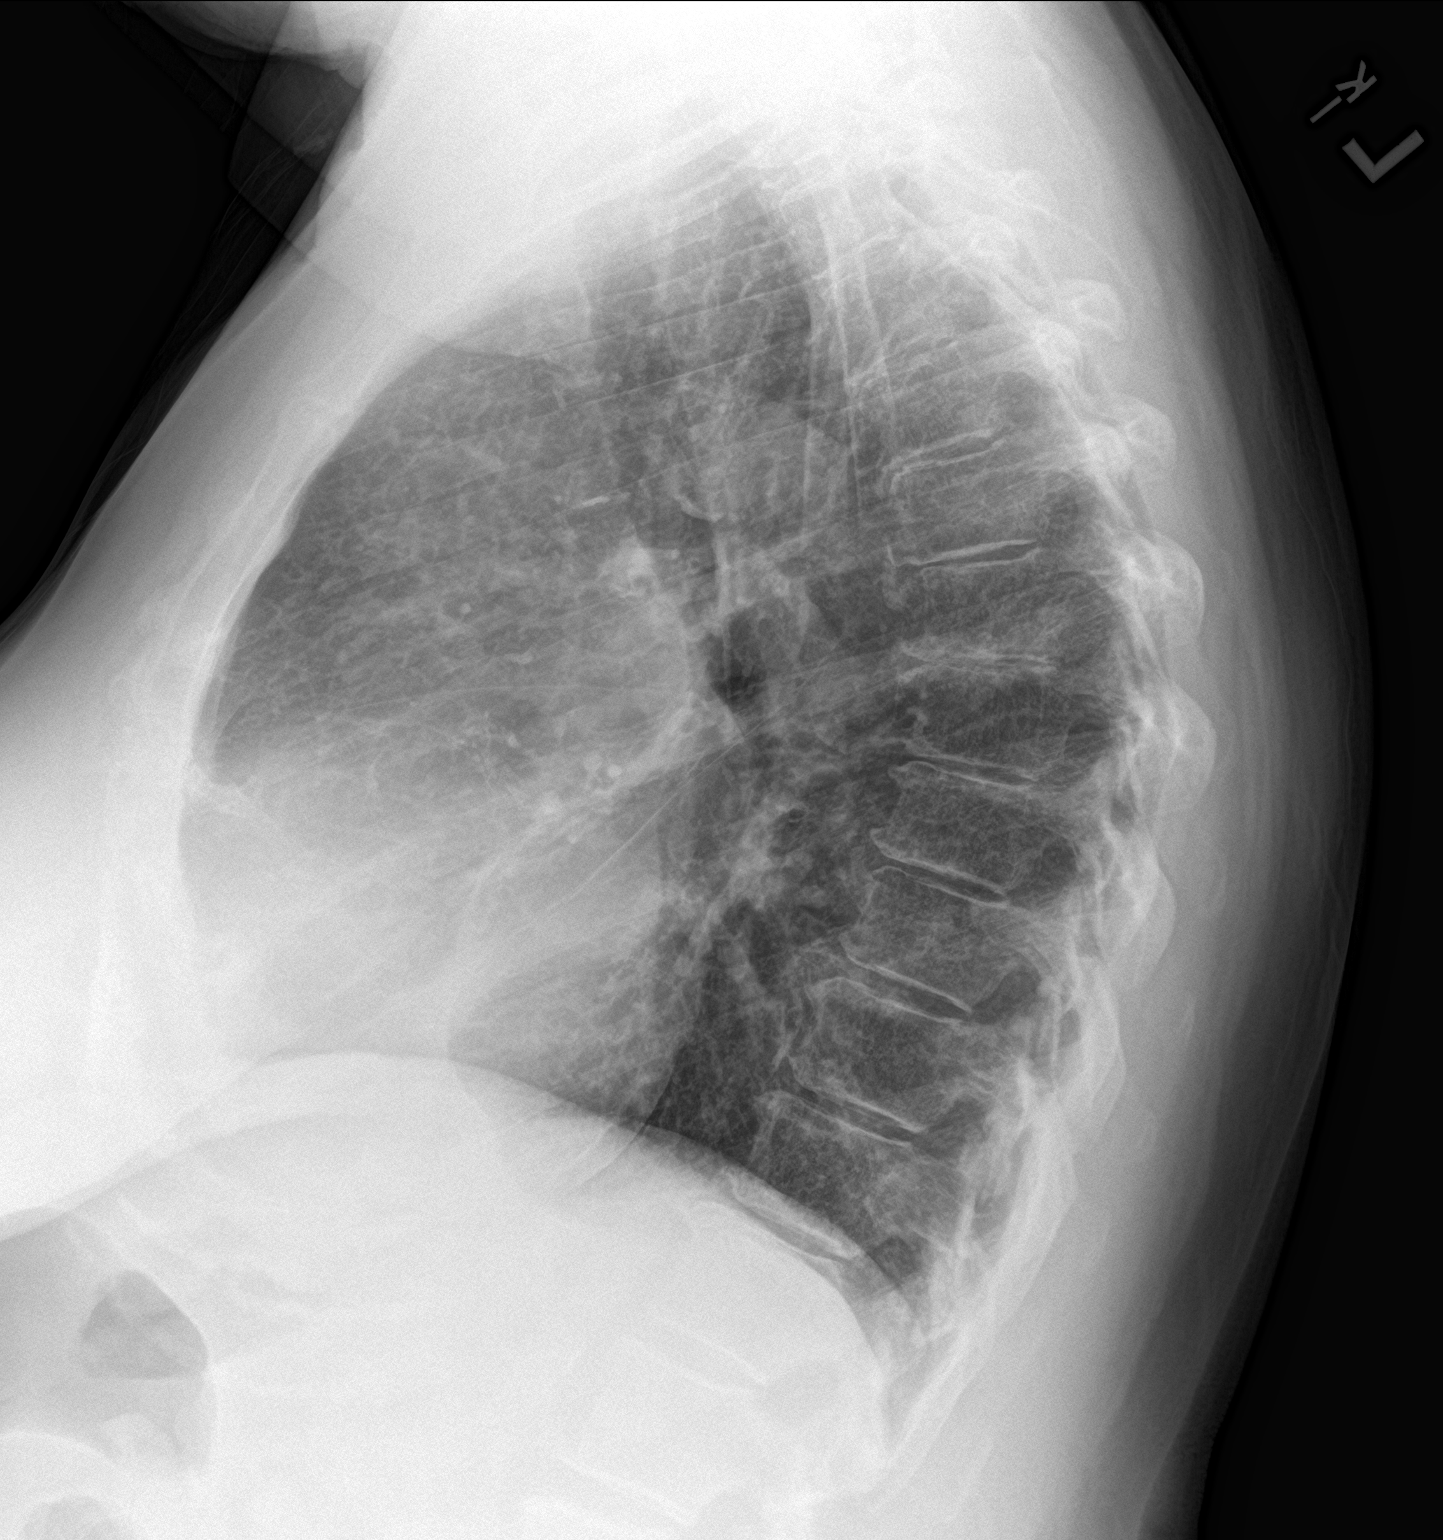

[2 of 2 positions shown; findings below may reference images not displayed]

FINDINGS: Linear opacity seen previously in the right mid lung is no longer
present, suggesting that this was likely either infiltration or
atelectasis. Lungs are clear and expanded today. Interval resolution
of previous pleural effusions and basilar atelectasis. Cardiac
enlargement without vascular congestion. No blunting of costophrenic
angles. No pneumothorax. Mediastinal contours appear intact.
Degenerative changes in the spine. Calcification of the aorta.
IMPRESSION: Interval resolution of previous right mid lung linear opacity. Lungs
are clear and expanded today. Previous pleural effusions and basilar
atelectasis have resolved. Cardiac enlargement.

## 2019-02-28 ENCOUNTER — Other Ambulatory Visit: Payer: Self-pay | Admitting: Family Medicine

## 2019-02-28 DIAGNOSIS — R269 Unspecified abnormalities of gait and mobility: Secondary | ICD-10-CM | POA: Diagnosis not present

## 2019-02-28 DIAGNOSIS — M6281 Muscle weakness (generalized): Secondary | ICD-10-CM | POA: Diagnosis not present

## 2019-02-28 DIAGNOSIS — F419 Anxiety disorder, unspecified: Secondary | ICD-10-CM

## 2019-02-28 DIAGNOSIS — J9611 Chronic respiratory failure with hypoxia: Secondary | ICD-10-CM | POA: Diagnosis not present

## 2019-03-02 ENCOUNTER — Other Ambulatory Visit: Payer: Self-pay | Admitting: Family Medicine

## 2019-03-29 ENCOUNTER — Other Ambulatory Visit: Payer: Self-pay | Admitting: Family Medicine

## 2019-03-31 DIAGNOSIS — J9611 Chronic respiratory failure with hypoxia: Secondary | ICD-10-CM | POA: Diagnosis not present

## 2019-03-31 DIAGNOSIS — M6281 Muscle weakness (generalized): Secondary | ICD-10-CM | POA: Diagnosis not present

## 2019-03-31 DIAGNOSIS — R269 Unspecified abnormalities of gait and mobility: Secondary | ICD-10-CM | POA: Diagnosis not present

## 2019-04-03 NOTE — Telephone Encounter (Signed)
Patient need to schedule an ov for more refills. Pt stated she will call in 2 weeks to schedule an ov

## 2019-04-15 ENCOUNTER — Other Ambulatory Visit: Payer: Self-pay | Admitting: Family Medicine

## 2019-04-16 ENCOUNTER — Other Ambulatory Visit: Payer: Self-pay | Admitting: Family Medicine

## 2019-04-19 ENCOUNTER — Other Ambulatory Visit: Payer: Self-pay | Admitting: Family Medicine

## 2019-04-19 DIAGNOSIS — F419 Anxiety disorder, unspecified: Secondary | ICD-10-CM

## 2019-04-30 ENCOUNTER — Ambulatory Visit: Payer: Medicare Other | Admitting: Family Medicine

## 2019-04-30 DIAGNOSIS — M6281 Muscle weakness (generalized): Secondary | ICD-10-CM | POA: Diagnosis not present

## 2019-04-30 DIAGNOSIS — J9611 Chronic respiratory failure with hypoxia: Secondary | ICD-10-CM | POA: Diagnosis not present

## 2019-04-30 DIAGNOSIS — R269 Unspecified abnormalities of gait and mobility: Secondary | ICD-10-CM | POA: Diagnosis not present

## 2019-05-02 ENCOUNTER — Ambulatory Visit: Payer: Medicare Other | Admitting: Cardiovascular Disease

## 2019-05-08 ENCOUNTER — Ambulatory Visit: Payer: Medicare Other | Admitting: Family Medicine

## 2019-05-17 ENCOUNTER — Other Ambulatory Visit: Payer: Self-pay | Admitting: Family Medicine

## 2019-05-18 ENCOUNTER — Other Ambulatory Visit: Payer: Self-pay | Admitting: Family Medicine

## 2019-05-20 ENCOUNTER — Ambulatory Visit: Payer: Medicare Other | Admitting: Family Medicine

## 2019-05-29 ENCOUNTER — Other Ambulatory Visit: Payer: Self-pay | Admitting: Family Medicine

## 2019-05-31 DIAGNOSIS — R269 Unspecified abnormalities of gait and mobility: Secondary | ICD-10-CM | POA: Diagnosis not present

## 2019-05-31 DIAGNOSIS — M6281 Muscle weakness (generalized): Secondary | ICD-10-CM | POA: Diagnosis not present

## 2019-05-31 DIAGNOSIS — J9611 Chronic respiratory failure with hypoxia: Secondary | ICD-10-CM | POA: Diagnosis not present

## 2019-06-09 ENCOUNTER — Other Ambulatory Visit: Payer: Self-pay | Admitting: Family Medicine

## 2019-06-10 ENCOUNTER — Ambulatory Visit: Payer: Medicare Other | Admitting: Family Medicine

## 2019-06-15 ENCOUNTER — Other Ambulatory Visit: Payer: Self-pay | Admitting: Family Medicine

## 2019-06-15 DIAGNOSIS — F419 Anxiety disorder, unspecified: Secondary | ICD-10-CM

## 2019-06-25 ENCOUNTER — Ambulatory Visit: Payer: Medicare Other | Admitting: Cardiovascular Disease

## 2019-06-30 DIAGNOSIS — J9611 Chronic respiratory failure with hypoxia: Secondary | ICD-10-CM | POA: Diagnosis not present

## 2019-06-30 DIAGNOSIS — R269 Unspecified abnormalities of gait and mobility: Secondary | ICD-10-CM | POA: Diagnosis not present

## 2019-06-30 DIAGNOSIS — M6281 Muscle weakness (generalized): Secondary | ICD-10-CM | POA: Diagnosis not present

## 2019-07-07 ENCOUNTER — Other Ambulatory Visit: Payer: Self-pay | Admitting: Family Medicine

## 2019-07-14 ENCOUNTER — Other Ambulatory Visit: Payer: Self-pay | Admitting: Family Medicine

## 2019-07-22 ENCOUNTER — Other Ambulatory Visit: Payer: Self-pay

## 2019-07-22 DIAGNOSIS — F419 Anxiety disorder, unspecified: Secondary | ICD-10-CM

## 2019-07-22 MED ORDER — ESCITALOPRAM OXALATE 10 MG PO TABS: ORAL_TABLET | ORAL | 0 refills | Status: DC

## 2019-07-31 DIAGNOSIS — R269 Unspecified abnormalities of gait and mobility: Secondary | ICD-10-CM | POA: Diagnosis not present

## 2019-07-31 DIAGNOSIS — M6281 Muscle weakness (generalized): Secondary | ICD-10-CM | POA: Diagnosis not present

## 2019-07-31 DIAGNOSIS — J9611 Chronic respiratory failure with hypoxia: Secondary | ICD-10-CM | POA: Diagnosis not present

## 2019-08-07 ENCOUNTER — Other Ambulatory Visit: Payer: Self-pay | Admitting: Family Medicine

## 2019-08-09 ENCOUNTER — Other Ambulatory Visit: Payer: Self-pay | Admitting: Family Medicine

## 2019-08-29 ENCOUNTER — Other Ambulatory Visit: Payer: Self-pay | Admitting: Family Medicine

## 2019-08-29 NOTE — Telephone Encounter (Signed)
Last OV 08/10/18 Last refill(s) 08/07/19 #30/0 Next OV not scheduled

## 2019-08-31 DIAGNOSIS — M6281 Muscle weakness (generalized): Secondary | ICD-10-CM | POA: Diagnosis not present

## 2019-08-31 DIAGNOSIS — J9611 Chronic respiratory failure with hypoxia: Secondary | ICD-10-CM | POA: Diagnosis not present

## 2019-08-31 DIAGNOSIS — R269 Unspecified abnormalities of gait and mobility: Secondary | ICD-10-CM | POA: Diagnosis not present

## 2019-09-03 ENCOUNTER — Ambulatory Visit: Payer: Medicare Other | Admitting: Cardiovascular Disease

## 2019-09-28 DIAGNOSIS — M6281 Muscle weakness (generalized): Secondary | ICD-10-CM | POA: Diagnosis not present

## 2019-09-28 DIAGNOSIS — R269 Unspecified abnormalities of gait and mobility: Secondary | ICD-10-CM | POA: Diagnosis not present

## 2019-09-28 DIAGNOSIS — J9611 Chronic respiratory failure with hypoxia: Secondary | ICD-10-CM | POA: Diagnosis not present

## 2019-10-05 ENCOUNTER — Other Ambulatory Visit: Payer: Self-pay | Admitting: Family Medicine

## 2019-10-10 ENCOUNTER — Telehealth: Payer: Self-pay | Admitting: Family Medicine

## 2019-10-10 NOTE — Telephone Encounter (Signed)
Medication Refill:  Bystolic  Pharmacy: [facility] [address] FAX: [phone]  Pt is scheduled for a telephone visit on 3/23 but wanted me to send a note back to see if she will fill it before then   Pt can be reached at [phone]

## 2019-10-11 MED ORDER — NEBIVOLOL HCL 5 MG PO TABS: 5.0000 mg | ORAL_TABLET | Freq: Every day | ORAL | 0 refills | Status: DC

## 2019-10-11 NOTE — Telephone Encounter (Signed)
Rx sent in

## 2019-10-15 ENCOUNTER — Encounter: Payer: Self-pay | Admitting: Family Medicine

## 2019-10-15 ENCOUNTER — Telehealth (INDEPENDENT_AMBULATORY_CARE_PROVIDER_SITE_OTHER): Payer: Medicare Other | Admitting: Family Medicine

## 2019-10-15 VITALS — BP 134/61 | Ht 61.0 in

## 2019-10-15 DIAGNOSIS — R739 Hyperglycemia, unspecified: Secondary | ICD-10-CM

## 2019-10-15 DIAGNOSIS — J449 Chronic obstructive pulmonary disease, unspecified: Secondary | ICD-10-CM | POA: Diagnosis not present

## 2019-10-15 DIAGNOSIS — I10 Essential (primary) hypertension: Secondary | ICD-10-CM

## 2019-10-15 DIAGNOSIS — E782 Mixed hyperlipidemia: Secondary | ICD-10-CM | POA: Diagnosis not present

## 2019-10-15 DIAGNOSIS — F419 Anxiety disorder, unspecified: Secondary | ICD-10-CM | POA: Diagnosis not present

## 2019-10-15 DIAGNOSIS — R296 Repeated falls: Secondary | ICD-10-CM

## 2019-10-15 DIAGNOSIS — N1831 Chronic kidney disease, stage 3a: Secondary | ICD-10-CM

## 2019-10-15 MED ORDER — FOLIC ACID 1 MG PO TABS: 1.0000 mg | ORAL_TABLET | Freq: Every day | ORAL | 2 refills | Status: DC

## 2019-10-15 MED ORDER — SPIRIVA HANDIHALER 18 MCG IN CAPS: 18.0000 ug | ORAL_CAPSULE | Freq: Every day | RESPIRATORY_TRACT | 3 refills | Status: DC

## 2019-10-15 MED ORDER — BUSPIRONE HCL 7.5 MG PO TABS: 7.5000 mg | ORAL_TABLET | Freq: Two times a day (BID) | ORAL | 1 refills | Status: DC

## 2019-10-15 MED ORDER — PROAIR HFA 108 (90 BASE) MCG/ACT IN AERS: INHALATION_SPRAY | RESPIRATORY_TRACT | 3 refills | Status: DC

## 2019-10-15 MED ORDER — FENOFIBRATE 54 MG PO TABS: 54.0000 mg | ORAL_TABLET | Freq: Every day | ORAL | 1 refills | Status: DC

## 2019-10-15 NOTE — Progress Notes (Signed)
Virtual Visit via Telephone Note  I connected with Beverly Wheeler on 10/15/19 at  2:00 PM EDT by telephone and verified that I am speaking with the correct person using two identifiers.   I discussed the limitations, risks, security and privacy concerns of performing an evaluation and management service by telephone and the availability of in person appointments. I also discussed with the patient that there may be a patient responsible charge related to this service. The patient expressed understanding and agreed to proceed.  Location patient: home Location provider: work or home office Participants present for the call: patient, provider Patient did not have a visit in the prior 7 days to address this/these issue(s).   History of Present Illness:  Last visit her glucose was elevated at 192. She states that most likely because she ate a bag of M&M's before blood work was done. Denies abdominal pain, nausea,vomiting, polydipsia,polyuria, or polyphagia.  HTN: Currently she is on Bystolic 5 mg daily. She has not had episodes of hypotension.  BP readings at home: 120s-130s/70s. Negative for unusual headache, visual changes, CP, palpitations, focal deficit, or worsening edema. HFrEF: Echo on 12/21/2017 showed LVEF 35 to 40%.  Last visit with cardiologist was last year before COVID 19   CKD III: She has not noted gross hematuria, decreased urine output, or foamy urine.  Lab Results  Component Value Date   CREATININE 1.12 08/10/2018   BUN 13 08/10/2018   NA 142 08/10/2018   K 4.4 08/10/2018   CL 96 08/10/2018   CO2 40 (H) 08/10/2018   She is still taking KCL 20 meq daily. On Furosemide 40 mg daily to treat lower extremity edema. Denies orthopnea and PND.  EHU:[id number] she is on atorvastatin 80 mg daily and fenofibrate 54 mg daily. She is tolerating medication well, no side effects reported.  Lab Results  Component Value Date   CHOL 243 (H) 08/10/2018   HDL 36.30 (L)  08/10/2018   LDLCALC 94 01/08/2018   LDLDIRECT 145.0 08/10/2018   TRIG 347.0 (H) 08/10/2018   CHOLHDL 7 08/10/2018   COPD: She needs refills for albuterol inhaler, which she uses as needed. She is on Spiriva 1 capsule daily. She does not use albuterol frequently. She is not having cough or wheezing. + Exertional dyspnea,stable.  She is on O2 supplementation, 2 LPM.  Anxiety: Currently she is on Lexapro 15 mg daily (1.5 tablets). She is taking BuSpar 7.5 mg twice daily as needed. She is feeling "okay" with medication. Negative for suicidal thoughts.  She is still living with her son.  Observations/Objective: Patient sounds cheerful and well on the phone. I do not appreciate any SOB. Speech and thought processing are grossly intact. Patient reported vitals:  Assessment and Plan:  Orders Placed This Encounter  Procedures  . Hemoglobin A1c  . Lipid panel  . Comprehensive metabolic panel  . Microalbumin / creatinine urine ratio   Blood glucose elevated 192 on 08/10/18 Healthy life style for diabetes prevention. Next lab appt she will be fasting. Other recommendations will be given according to A1c.  Essential hypertension She took BP during visit: 163/61, she states that this is unusual.BP's < 140/90 most of the time. For now no changes in diastolic dose. Continue monitoring BP regularly. Low-salt diet recommended.  COPD  GOLD II based on fev1/VC of 58%  Problem seems to be stable. Continue Spiriva 1 capsule daily and albuterol inhaler 2 puffs every 4-6 hours as needed.  Hyperlipidemia, mixed Continue atorvastatin 80 mg  and fenofibrate 54 mg daily. Continue low-fat diet. Further recommendation will be given according to lipid panel results. We will arrange lab appointment for next week.   Anxiety disorder, unspecified Otherwise stable. Recommend taking BuSpar 7.5 mg twice daily instead as needed. Continue Lexapro 10 mg 1.5 tablet daily.  CKD (chronic kidney  disease), stage III We have not check renal function since 07/2018. Continue adequate hydration, BP control, and low-salt diet.   Follow Up Instructions: Instructed to arrange follow-up appointment with her cardiologist. Return for Lab appt,fasting next week and 4 months follow up.  I did not refer this patient for an OV in the next 24 hours for this/these issue(s).  I discussed the assessment and treatment plan with the patient. Beverly Wheeler was provided an opportunity to ask questions and all were answered. She agreed with the plan and demonstrated an understanding of the instructions.   The patient was advised to call back or seek an in-person evaluation if the symptoms worsen or if the condition fails to improve as anticipated.  I provided 20 minutes of non-face-to-face time during this encounter.   Derion Kreiter [place], MD

## 2019-10-15 NOTE — Assessment & Plan Note (Signed)
She took BP during visit: 163/61, she states that this is unusual.BP's < 140/90 most of the time. For now no changes in diastolic dose. Continue monitoring BP regularly. Low-salt diet recommended.

## 2019-10-15 NOTE — Assessment & Plan Note (Signed)
We have not check renal function since 07/2018. Continue adequate hydration, BP control, and low-salt diet.

## 2019-10-15 NOTE — Assessment & Plan Note (Signed)
Continue atorvastatin 80 mg and fenofibrate 54 mg daily. Continue low-fat diet. Further recommendation will be given according to lipid panel results. We will arrange lab appointment for next week.

## 2019-10-15 NOTE — Assessment & Plan Note (Signed)
Otherwise stable. Recommend taking BuSpar 7.5 mg twice daily instead as needed. Continue Lexapro 10 mg 1.5 tablet daily.

## 2019-10-15 NOTE — Assessment & Plan Note (Signed)
Problem seems to be stable. Continue Spiriva 1 capsule daily and albuterol inhaler 2 puffs every 4-6 hours as needed.

## 2019-10-16 ENCOUNTER — Other Ambulatory Visit: Payer: Self-pay | Admitting: Family Medicine

## 2019-10-16 DIAGNOSIS — F419 Anxiety disorder, unspecified: Secondary | ICD-10-CM

## 2019-10-17 NOTE — Addendum Note (Signed)
Addended by: [place], Darius Lundberg G on: 10/17/2019 03:13 PM   Modules accepted: Orders

## 2019-10-21 ENCOUNTER — Other Ambulatory Visit: Payer: Medicare Other

## 2019-10-22 ENCOUNTER — Ambulatory Visit: Payer: Medicare Other | Admitting: Cardiovascular Disease

## 2019-10-29 DIAGNOSIS — J9611 Chronic respiratory failure with hypoxia: Secondary | ICD-10-CM | POA: Diagnosis not present

## 2019-10-29 DIAGNOSIS — M6281 Muscle weakness (generalized): Secondary | ICD-10-CM | POA: Diagnosis not present

## 2019-10-29 DIAGNOSIS — R269 Unspecified abnormalities of gait and mobility: Secondary | ICD-10-CM | POA: Diagnosis not present

## 2019-11-05 ENCOUNTER — Other Ambulatory Visit: Payer: Self-pay | Admitting: Family Medicine

## 2019-11-07 ENCOUNTER — Other Ambulatory Visit: Payer: Self-pay

## 2019-11-08 ENCOUNTER — Other Ambulatory Visit: Payer: Medicare Other

## 2019-11-11 ENCOUNTER — Ambulatory Visit: Payer: Medicare Other | Attending: Family Medicine | Admitting: Physical Therapy

## 2019-11-28 DIAGNOSIS — J9611 Chronic respiratory failure with hypoxia: Secondary | ICD-10-CM | POA: Diagnosis not present

## 2019-11-28 DIAGNOSIS — R269 Unspecified abnormalities of gait and mobility: Secondary | ICD-10-CM | POA: Diagnosis not present

## 2019-11-28 DIAGNOSIS — M6281 Muscle weakness (generalized): Secondary | ICD-10-CM | POA: Diagnosis not present

## 2019-12-04 ENCOUNTER — Other Ambulatory Visit: Payer: Self-pay | Admitting: Family Medicine

## 2019-12-09 ENCOUNTER — Other Ambulatory Visit: Payer: Self-pay | Admitting: Family Medicine

## 2019-12-15 ENCOUNTER — Other Ambulatory Visit: Payer: Self-pay | Admitting: Family Medicine

## 2019-12-19 ENCOUNTER — Ambulatory Visit: Payer: Medicare Other | Admitting: Physical Therapy

## 2019-12-22 ENCOUNTER — Other Ambulatory Visit: Payer: Self-pay | Admitting: Family Medicine

## 2019-12-29 DIAGNOSIS — M6281 Muscle weakness (generalized): Secondary | ICD-10-CM | POA: Diagnosis not present

## 2019-12-29 DIAGNOSIS — J9611 Chronic respiratory failure with hypoxia: Secondary | ICD-10-CM | POA: Diagnosis not present

## 2019-12-29 DIAGNOSIS — R269 Unspecified abnormalities of gait and mobility: Secondary | ICD-10-CM | POA: Diagnosis not present

## 2020-01-18 ENCOUNTER — Other Ambulatory Visit: Payer: Self-pay | Admitting: Family Medicine

## 2020-01-18 DIAGNOSIS — J449 Chronic obstructive pulmonary disease, unspecified: Secondary | ICD-10-CM

## 2020-01-22 ENCOUNTER — Ambulatory Visit: Payer: Medicare Other | Admitting: Physical Therapy

## 2020-01-28 DIAGNOSIS — J9611 Chronic respiratory failure with hypoxia: Secondary | ICD-10-CM | POA: Diagnosis not present

## 2020-01-28 DIAGNOSIS — M6281 Muscle weakness (generalized): Secondary | ICD-10-CM | POA: Diagnosis not present

## 2020-01-28 DIAGNOSIS — R269 Unspecified abnormalities of gait and mobility: Secondary | ICD-10-CM | POA: Diagnosis not present

## 2020-02-19 ENCOUNTER — Ambulatory Visit: Payer: Medicare Other | Admitting: Family Medicine

## 2020-02-28 DIAGNOSIS — J9611 Chronic respiratory failure with hypoxia: Secondary | ICD-10-CM | POA: Diagnosis not present

## 2020-02-28 DIAGNOSIS — R269 Unspecified abnormalities of gait and mobility: Secondary | ICD-10-CM | POA: Diagnosis not present

## 2020-02-28 DIAGNOSIS — M6281 Muscle weakness (generalized): Secondary | ICD-10-CM | POA: Diagnosis not present

## 2020-03-11 ENCOUNTER — Ambulatory Visit: Payer: Medicare Other | Admitting: Family Medicine

## 2020-03-25 ENCOUNTER — Telehealth (INDEPENDENT_AMBULATORY_CARE_PROVIDER_SITE_OTHER): Payer: Medicare Other | Admitting: Family Medicine

## 2020-03-25 ENCOUNTER — Encounter: Payer: Self-pay | Admitting: Family Medicine

## 2020-03-25 VITALS — BP 132/69 | Ht 61.0 in

## 2020-03-25 DIAGNOSIS — N1831 Chronic kidney disease, stage 3a: Secondary | ICD-10-CM

## 2020-03-25 DIAGNOSIS — I10 Essential (primary) hypertension: Secondary | ICD-10-CM

## 2020-03-25 DIAGNOSIS — E782 Mixed hyperlipidemia: Secondary | ICD-10-CM | POA: Diagnosis not present

## 2020-03-25 DIAGNOSIS — R7303 Prediabetes: Secondary | ICD-10-CM | POA: Diagnosis not present

## 2020-03-25 DIAGNOSIS — Z1159 Encounter for screening for other viral diseases: Secondary | ICD-10-CM

## 2020-03-25 DIAGNOSIS — J449 Chronic obstructive pulmonary disease, unspecified: Secondary | ICD-10-CM

## 2020-03-25 DIAGNOSIS — J3089 Other allergic rhinitis: Secondary | ICD-10-CM

## 2020-03-25 MED ORDER — MONTELUKAST SODIUM 10 MG PO TABS: 10.0000 mg | ORAL_TABLET | Freq: Every day | ORAL | 3 refills | Status: DC

## 2020-03-25 NOTE — Progress Notes (Signed)
Virtual Visit via Telephone Note I connected with Beverly Wheeler on 9/1/21at 10:30 AM EDT by telephone and verified that I am speaking with the correct person using two identifiers.   I discussed the limitations, risks, security and privacy concerns of performing an evaluation and management service by telephone and the availability of in person appointments. I also discussed with the patient that there may be a patient responsible charge related to this service. The patient expressed understanding and agreed to proceed.  Location patient: home Location provider: work office Participants present for the call: patient, provider Patient did not have a visit in the prior 7 days to address this/these issue(s).  History of Present Illness: Beverly Wheeler is a 74 yo female with hx of COPD,HTN,anxiety,CAD,and GERD following on chronic medical conditions. She was last seen on 10/15/19.  She is having "bad" allergies this year. She is on Zyrtec 10 mg  And Nasocort nasal spray. Nasal congestion, rhinorrhea, postnasal drainage, "watery eyes", and occasional cough. Negative for dyspnea and wheezing. According to patient, she has been tested for allergies and was positive for mold, weeds, and dust.  Hypertension: Currently she is on Bystolic 5 mg daily. She is checking BP periodically, 120s-130s/70s. Negative for unusual headache, visual changes, CP, palpitation, focal neurologic deficit, or worsening edema. CKD 3: She has not noted gross hematuria, foam in urine, or decreased urine output. Cr 1.2  and GFR mid 40's.  Lab Results  Component Value Date   CREATININE 1.12 08/10/2018   BUN 13 08/10/2018   NA 142 08/10/2018   K 4.4 08/10/2018   CL 96 08/10/2018   CO2 40 (H) 08/10/2018   HLD: She is on Atorvastatin 80 mg daily and fenofibrate 45 mg daily. She is tolerating medication well. She is trying to following a healthful diet, she has gained some weight. Not able to exercise regularly.  Lab  Results  Component Value Date   CHOL 243 (H) 08/10/2018   HDL 36.30 (L) 08/10/2018   LDLCALC 94 01/08/2018   LDLDIRECT 145.0 08/10/2018   TRIG 347.0 (H) 08/10/2018   CHOLHDL 7 08/10/2018   Prediabetes:  Negative for abdominal pain, nausea,vomiting, polydipsia,polyuria, or polyphagia.  Lab Results  Component Value Date   HGBA1C 6.3 12/11/2009  COPD: Currently she is on Spiriva daily and albuterol inhaler as needed. In average she uses albuterol inhaler once per week.  She also has some questions about COVID-19 vaccination. She has not had any and wonders if she can have a given the fact she has some seasonal allergies and "breathing problems."  Observations/Objective: Patient sounds cheerful and well on the phone. I do not appreciate any SOB. Speech and thought processing are grossly intact. Patient reported vitals:BP 132/69   Ht 5\' 1"  (1.549 m)   BMI 35.19 kg/m   Assessment and Plan:  1. Prediabetes Healthier life style for primary prevention recommended.  - Hemoglobin A1c; Future  2. Hyperlipidemia, mixed Continue Atorvastatin 80 mg daily and Fenofibrate 54 mg daily.. Further recommendations according to lipid results.  - COMPLETE METABOLIC PANEL WITH GFR; Future - Lipid panel; Future  3. Stage 3a chronic kidney disease Adequate BP controlled and hydration. Low salt diet and avoidance of NSAID's to continue.  4. Essential hypertension BP adequately controlled. Continue Bystolic 5 mg daily.  - COMPLETE METABOLIC PANEL WITH GFR; Future  5. COPD  GOLD II based on fev1/VC of 58%  Problem is stable. No changes in current management. Recommend COVID 19 vaccination.  6. Encounter for  HCV screening test for low risk patient - Hepatitis C antibody; Future  7. Non-seasonal allergic rhinitis due to other allergic trigger Singulair 10 mg to take at night added today. Continue to take 10 mg daily and Nasacort daily as needed. I also recommended trying nasal saline  irrigations as needed.  - montelukast (SINGULAIR) 10 MG tablet; Take 1 tablet (10 mg total) by mouth at bedtime.  Dispense: 30 tablet; Refill: 3  Follow Up Instructions:  Return in about 5 months (around 08/25/2020) for Needs fasting labs,she prefers 03/31/20 around 9 am .Thanks.  I did not refer this patient for an OV in the next 24 hours for this/these issue(s).  I discussed the assessment and treatment plan with the patient. Beverly Wheeler was provided an opportunity to ask questions and all were answered. She agreed with the plan and demonstrated an understanding of the instructions.  I provided 16 minutes of non-face-to-face time during this encounter.   Earmon Sherrow [place], MD

## 2020-03-30 DIAGNOSIS — M6281 Muscle weakness (generalized): Secondary | ICD-10-CM | POA: Diagnosis not present

## 2020-03-30 DIAGNOSIS — R269 Unspecified abnormalities of gait and mobility: Secondary | ICD-10-CM | POA: Diagnosis not present

## 2020-03-30 DIAGNOSIS — J9611 Chronic respiratory failure with hypoxia: Secondary | ICD-10-CM | POA: Diagnosis not present

## 2020-03-31 ENCOUNTER — Other Ambulatory Visit: Payer: Medicare Other

## 2020-04-09 ENCOUNTER — Other Ambulatory Visit: Payer: Medicare Other

## 2020-04-15 ENCOUNTER — Other Ambulatory Visit: Payer: Self-pay

## 2020-04-16 ENCOUNTER — Other Ambulatory Visit (INDEPENDENT_AMBULATORY_CARE_PROVIDER_SITE_OTHER): Payer: Medicare Other

## 2020-04-16 DIAGNOSIS — Z1159 Encounter for screening for other viral diseases: Secondary | ICD-10-CM

## 2020-04-16 DIAGNOSIS — E782 Mixed hyperlipidemia: Secondary | ICD-10-CM

## 2020-04-16 DIAGNOSIS — E1169 Type 2 diabetes mellitus with other specified complication: Secondary | ICD-10-CM

## 2020-04-16 DIAGNOSIS — I10 Essential (primary) hypertension: Secondary | ICD-10-CM

## 2020-04-16 DIAGNOSIS — R7303 Prediabetes: Secondary | ICD-10-CM | POA: Diagnosis not present

## 2020-04-17 LAB — COMPLETE METABOLIC PANEL WITH GFR
AG Ratio: 1.7 (calc) (ref 1.0–2.5)
ALT: 16 U/L (ref 6–29)
AST: 17 U/L (ref 10–35)
Albumin: 4 g/dL (ref 3.6–5.1)
Alkaline phosphatase (APISO): 64 U/L (ref 37–153)
BUN/Creatinine Ratio: 15 (calc) (ref 6–22)
BUN: 16 mg/dL (ref 7–25)
CO2: 37 mmol/L — ABNORMAL HIGH (ref 20–32)
Calcium: 9.6 mg/dL (ref 8.6–10.4)
Chloride: 89 mmol/L — ABNORMAL LOW (ref 98–110)
Creat: 1.07 mg/dL — ABNORMAL HIGH (ref 0.60–0.93)
GFR, Est African American: 60 mL/min/{1.73_m2} (ref >=60)
GFR, Est Non African American: 52 mL/min/{1.73_m2} — ABNORMAL LOW (ref >=60)
Globulin: 2.4 g/dL (calc) (ref 1.9–3.7)
Glucose, Bld: 274 mg/dL — ABNORMAL HIGH (ref 65–99)
Potassium: 3.9 mmol/L (ref 3.5–5.3)
Sodium: 140 mmol/L (ref 135–146)
Total Bilirubin: 0.3 mg/dL (ref 0.2–1.2)
Total Protein: 6.4 g/dL (ref 6.1–8.1)

## 2020-04-17 LAB — LIPID PANEL
Cholesterol: 214 mg/dL — ABNORMAL HIGH (ref <200)
HDL: 33 mg/dL — ABNORMAL LOW (ref >=50)
LDL Cholesterol (Calc): 134 mg/dL (calc) — ABNORMAL HIGH
Non-HDL Cholesterol (Calc): 181 mg/dL (calc) — ABNORMAL HIGH (ref <130)
Total CHOL/HDL Ratio: 6.5 (calc) — ABNORMAL HIGH (ref <5.0)
Triglycerides: 330 mg/dL — ABNORMAL HIGH (ref <150)

## 2020-04-17 LAB — HEMOGLOBIN A1C
Hgb A1c MFr Bld: 9 % of total Hgb — ABNORMAL HIGH (ref <5.7)
Mean Plasma Glucose: 212 (calc)
eAG (mmol/L): 11.7 (calc)

## 2020-04-17 LAB — HEPATITIS C ANTIBODY
Hepatitis C Ab: NONREACTIVE
SIGNAL TO CUT-OFF: 0.02 (ref <1.00)

## 2020-04-29 DIAGNOSIS — R269 Unspecified abnormalities of gait and mobility: Secondary | ICD-10-CM | POA: Diagnosis not present

## 2020-04-29 DIAGNOSIS — M6281 Muscle weakness (generalized): Secondary | ICD-10-CM | POA: Diagnosis not present

## 2020-04-29 DIAGNOSIS — J9611 Chronic respiratory failure with hypoxia: Secondary | ICD-10-CM | POA: Diagnosis not present

## 2020-04-30 ENCOUNTER — Other Ambulatory Visit: Payer: Self-pay | Admitting: Family Medicine

## 2020-05-03 MED ORDER — EMPAGLIFLOZIN 10 MG PO TABS: 10.0000 mg | ORAL_TABLET | Freq: Every day | ORAL | 4 refills | Status: DC

## 2020-05-03 MED ORDER — METFORMIN HCL 500 MG PO TABS: 500.0000 mg | ORAL_TABLET | Freq: Two times a day (BID) | ORAL | 3 refills | Status: DC

## 2020-05-03 MED ORDER — OMEGA-3-ACID ETHYL ESTERS 1 G PO CAPS: 1.0000 g | ORAL_CAPSULE | Freq: Two times a day (BID) | ORAL | 1 refills | Status: DC

## 2020-05-03 NOTE — Addendum Note (Signed)
Addended by: [place], Clary Boulais G on: 05/03/2020 01:34 PM   Modules accepted: Orders

## 2020-05-04 ENCOUNTER — Other Ambulatory Visit: Payer: Self-pay

## 2020-05-04 MED ORDER — ACCU-CHEK SOFTCLIX LANCETS MISC: 12 refills | Status: AC

## 2020-05-04 MED ORDER — ACCU-CHEK AVIVA PLUS W/DEVICE KIT: PACK | 0 refills | Status: DC

## 2020-05-04 MED ORDER — ACCU-CHEK AVIVA PLUS VI STRP: ORAL_STRIP | 12 refills | Status: DC

## 2020-05-13 ENCOUNTER — Telehealth: Payer: Self-pay | Admitting: *Deleted

## 2020-05-13 NOTE — Telephone Encounter (Signed)
Patient called stating she was put on a diabetic medication last week and she doesn't have anything to follow. Patient states she doesn't know what she should and shouldn't eat. Please advise [phone]

## 2020-05-15 NOTE — Telephone Encounter (Signed)
Blood sugar readings:140-150-165-141 before eating in the mornings.   Pt unable to pick up jardiance due to cost ($170+) for 30 days. Pt wants to know if there is anything else that can be sent in to take with the Metformin?   Checks blood sugar in the morning, lunch, and dinner before eating.   I will mail her a list of things she should & shouldn't eat.

## 2020-05-19 ENCOUNTER — Other Ambulatory Visit: Payer: Self-pay | Admitting: Family Medicine

## 2020-05-19 DIAGNOSIS — E119 Type 2 diabetes mellitus without complications: Secondary | ICD-10-CM | POA: Insufficient documentation

## 2020-05-19 DIAGNOSIS — E1169 Type 2 diabetes mellitus with other specified complication: Secondary | ICD-10-CM

## 2020-05-19 MED ORDER — GLIPIZIDE 5 MG PO TABS: 5.0000 mg | ORAL_TABLET | Freq: Every day | ORAL | 1 refills | Status: DC

## 2020-05-19 NOTE — Telephone Encounter (Signed)
Glipizide, the Januvia will be the same cost at the [facility] unfortunately.

## 2020-05-19 NOTE — Telephone Encounter (Signed)
Options: Trulicity,victoza,or ozempic (injectable). Januvia or glipizide. She can also find out which meds are cover under her health insurance. Thanks, BJ

## 2020-05-19 NOTE — Telephone Encounter (Signed)
Prescription for glipizide 5 mg sent to her pharmacy to take before breakfast. Metformin 500 mg with breakfast and supper. 4 months follow-up. Thanks, BJ

## 2020-05-20 NOTE — Telephone Encounter (Signed)
I spoke with patient. She is aware that Glipizide was sent in & to keep her appt for February 2022.

## 2020-05-27 ENCOUNTER — Other Ambulatory Visit: Payer: Self-pay

## 2020-05-27 MED ORDER — ACCU-CHEK AVIVA PLUS VI STRP
ORAL_STRIP | 12 refills | Status: DC
Start: 2020-05-27 — End: 2021-06-14

## 2020-05-30 DIAGNOSIS — J9611 Chronic respiratory failure with hypoxia: Secondary | ICD-10-CM | POA: Diagnosis not present

## 2020-05-30 DIAGNOSIS — R269 Unspecified abnormalities of gait and mobility: Secondary | ICD-10-CM | POA: Diagnosis not present

## 2020-05-30 DIAGNOSIS — M6281 Muscle weakness (generalized): Secondary | ICD-10-CM | POA: Diagnosis not present

## 2020-06-03 ENCOUNTER — Other Ambulatory Visit: Payer: Self-pay | Admitting: Family Medicine

## 2020-06-29 DIAGNOSIS — J9611 Chronic respiratory failure with hypoxia: Secondary | ICD-10-CM | POA: Diagnosis not present

## 2020-06-29 DIAGNOSIS — M6281 Muscle weakness (generalized): Secondary | ICD-10-CM | POA: Diagnosis not present

## 2020-06-29 DIAGNOSIS — R269 Unspecified abnormalities of gait and mobility: Secondary | ICD-10-CM | POA: Diagnosis not present

## 2020-07-01 ENCOUNTER — Telehealth: Payer: Self-pay | Admitting: Family Medicine

## 2020-07-01 NOTE — Telephone Encounter (Signed)
Spoke with pt she stated she needed to check her calendar and call back to  schedule Medicare Annual Wellness Visit (AWV) either virtually or in office.   Last AWV no information please schedule at anytime with [facility] Nurse Health Advisor 1 or 2   This should be a 45 minute visit.

## 2020-07-30 DIAGNOSIS — J9611 Chronic respiratory failure with hypoxia: Secondary | ICD-10-CM | POA: Diagnosis not present

## 2020-07-30 DIAGNOSIS — R269 Unspecified abnormalities of gait and mobility: Secondary | ICD-10-CM | POA: Diagnosis not present

## 2020-07-30 DIAGNOSIS — M6281 Muscle weakness (generalized): Secondary | ICD-10-CM | POA: Diagnosis not present

## 2020-08-16 ENCOUNTER — Other Ambulatory Visit: Payer: Self-pay | Admitting: Family Medicine

## 2020-08-25 ENCOUNTER — Telehealth (INDEPENDENT_AMBULATORY_CARE_PROVIDER_SITE_OTHER): Payer: Medicare Other | Admitting: Family Medicine

## 2020-08-25 ENCOUNTER — Encounter: Payer: Self-pay | Admitting: Family Medicine

## 2020-08-25 VITALS — BP 137/70 | Ht 61.0 in

## 2020-08-25 DIAGNOSIS — E1169 Type 2 diabetes mellitus with other specified complication: Secondary | ICD-10-CM

## 2020-08-25 DIAGNOSIS — I10 Essential (primary) hypertension: Secondary | ICD-10-CM | POA: Diagnosis not present

## 2020-08-25 DIAGNOSIS — E782 Mixed hyperlipidemia: Secondary | ICD-10-CM

## 2020-08-25 DIAGNOSIS — J449 Chronic obstructive pulmonary disease, unspecified: Secondary | ICD-10-CM

## 2020-08-25 DIAGNOSIS — F419 Anxiety disorder, unspecified: Secondary | ICD-10-CM

## 2020-08-25 MED ORDER — ESCITALOPRAM OXALATE 10 MG PO TABS: ORAL_TABLET | ORAL | 2 refills | Status: DC

## 2020-08-25 MED ORDER — BUSPIRONE HCL 7.5 MG PO TABS: 7.5000 mg | ORAL_TABLET | Freq: Two times a day (BID) | ORAL | 1 refills | Status: DC

## 2020-08-25 MED ORDER — SPIRIVA HANDIHALER 18 MCG IN CAPS: ORAL_CAPSULE | RESPIRATORY_TRACT | 3 refills | Status: DC

## 2020-08-25 NOTE — Progress Notes (Signed)
Virtual Visit via Telephone Note I connected with Beverly Wheeler on 08/25/20 at 10:30 AM EST by telephone and verified that I am speaking with the correct person using two identifiers.   I discussed the limitations, risks, security and privacy concerns of performing an evaluation and management service by telephone and the availability of in person appointments. I also discussed with the patient that there may be a patient responsible charge related to this service. The patient expressed understanding and agreed to proceed.  Location patient: home Location provider: work or home office Participants present for the call: patient, provider Patient did not have a visit in the prior 7 days to address this/these issue(s).  Chief Complaint  Patient presents with  . Follow-up    History of Present Illness: Beverly Wheeler is a 75 yo female with hx of DM II,COPD,CKD,HTN, anxiety,depression,and HLD following on some of her chronic medical problems. DM II: Dx'ed 03/25/20. She is on Metformin 500 mg bid and Glipizide 5 mg daily. She is checking BS's a few times per day, 120's-180's. She has had some in the 200's and 300's, usually when she eats certain foods like waffles. Occasionally she has had low 70's, usually when she skips meals.  Negative for abdominal pain, nausea,vomiting, polydipsia,polyuria, or polyphagia.   Lab Results  Component Value Date   HGBA1C 9.0 (H) 04/16/2020   HTN: Home BP readings 130's/70's. Negative for severe/frequent headache, visual changes, chest pain palpitation,focal weakness, or worsening edema. She takes Furosemide 40 mg daily for LE edema.  Lab Results  Component Value Date   CREATININE 1.07 (H) 04/16/2020   BUN 16 04/16/2020   NA 140 04/16/2020   K 3.9 04/16/2020   CL 89 (L) 04/16/2020   CO2 37 (H) 04/16/2020   Last visit Singulair 10 mg was added for allergies. States that with this cold weather her allergies are "horrible." This happens around this  times of the year. Dry nose, aggravated by O2 supplementation.  COPD: She is on Spiriva 1 cap daily. She does not use Albuterol inh daily.  Negative for cough and wheezing. SOB exacerbated by exertion. On supplemental O2 2 LPM.  She is more active, walking around the house 2 times daily.  HLD:She is on Fenofibrate 54 mg daily and Atorvastatin 80 mg daily. Tolerating medication well.  Lab Results  Component Value Date   CHOL 214 (H) 04/16/2020   HDL 33 (L) 04/16/2020   LDLCALC 134 (H) 04/16/2020   LDLDIRECT 145.0 08/10/2018   TRIG 330 (H) 04/16/2020   CHOLHDL 6.5 (H) 04/16/2020   Lab Results  Component Value Date   ALT 16 04/16/2020   AST 17 04/16/2020   ALKPHOS 56 08/10/2018   BILITOT 0.3 04/16/2020   Anxiety: She is on BusPar 7.5 mg bid and Lexapro 10 mg 1.5 tab daily. Tolerating medication well. Reporting problem as well controlled.  Observations/Objective: Patient sounds cheerful and well on the phone. I do not appreciate any SOB. Speech and thought processing are grossly intact. Patient reported vitals:BP 137/70   Ht 5\' 1"  (1.549 m)   BMI 35.19 kg/m   Assessment and Plan: Orders Placed This Encounter  Procedures  . Hemoglobin A1c  . Fructosamine  . Comprehensive metabolic panel  . Lipid panel    1. Type 2 diabetes mellitus with other specified complication, without long-term current use of insulin (HCC) HgA1C has not been at goal. BS readings seem to be better. No changes in Glipizide or Metformin dose.Caution with skipping meals. Regular  exercise and healthy diet encouraged. Annual eye exam and foot care recommended. F/U in 4-5 months  2. COPD  GOLD II based on fev1/VC of 58%  Problem is stable. Continue spiriva a capsule daily and Albuterol inh 2 puff q 4-6 hours as needed.  - tiotropium (SPIRIVA HANDIHALER) 18 MCG inhalation capsule; INHALE 1 CAPSULE VIA HANDIHALER ONCE DAILY AT THE SAME TIME EVERY DAY  Dispense: 90 capsule; Refill: 3  3.  Essential hypertension BP adequately controlled, Continue Bystolic 5 mg daily Low salt diet. Continue monitoring BP regularly.  4. Hyperlipidemia, mixed Continue Atorvastatin 80 mg daily and Fenofibrate 54 mg daily as well as low fat diet. Will schedule fasting labs.  5. Anxiety disorder, unspecified type Well controlled. Continue Lexapro 15 mg daily and Buspar 7.5 mg bid.  Follow Up Instructions:  Return in about 4 months (around 12/23/2020) for Fasting labs.  I did not refer this patient for an OV in the next 24 hours for this/these issue(s).  I discussed the assessment and treatment plan with the patient. Beverly Wheeler was provided an opportunity to ask questions and all were answered. She agreed with the plan and demonstrated an understanding of the instructions.   I provided 21 minutes of non-face-to-face time during this encounter.   Schawn Byas [place], MD

## 2020-08-30 DIAGNOSIS — R269 Unspecified abnormalities of gait and mobility: Secondary | ICD-10-CM | POA: Diagnosis not present

## 2020-08-30 DIAGNOSIS — J9611 Chronic respiratory failure with hypoxia: Secondary | ICD-10-CM | POA: Diagnosis not present

## 2020-08-30 DIAGNOSIS — M6281 Muscle weakness (generalized): Secondary | ICD-10-CM | POA: Diagnosis not present

## 2020-09-03 ENCOUNTER — Other Ambulatory Visit: Payer: Medicare Other

## 2020-09-11 ENCOUNTER — Other Ambulatory Visit: Payer: Medicare Other

## 2020-09-23 ENCOUNTER — Other Ambulatory Visit: Payer: Medicare Other

## 2020-09-26 ENCOUNTER — Other Ambulatory Visit: Payer: Self-pay | Admitting: Family Medicine

## 2020-09-27 DIAGNOSIS — R269 Unspecified abnormalities of gait and mobility: Secondary | ICD-10-CM | POA: Diagnosis not present

## 2020-09-27 DIAGNOSIS — J9611 Chronic respiratory failure with hypoxia: Secondary | ICD-10-CM | POA: Diagnosis not present

## 2020-09-27 DIAGNOSIS — M6281 Muscle weakness (generalized): Secondary | ICD-10-CM | POA: Diagnosis not present

## 2020-10-28 DIAGNOSIS — R269 Unspecified abnormalities of gait and mobility: Secondary | ICD-10-CM | POA: Diagnosis not present

## 2020-10-28 DIAGNOSIS — M6281 Muscle weakness (generalized): Secondary | ICD-10-CM | POA: Diagnosis not present

## 2020-10-28 DIAGNOSIS — J9611 Chronic respiratory failure with hypoxia: Secondary | ICD-10-CM | POA: Diagnosis not present

## 2020-11-15 ENCOUNTER — Other Ambulatory Visit: Payer: Self-pay | Admitting: Family Medicine

## 2020-11-15 DIAGNOSIS — E1169 Type 2 diabetes mellitus with other specified complication: Secondary | ICD-10-CM

## 2020-11-19 ENCOUNTER — Other Ambulatory Visit: Payer: Self-pay | Admitting: Family Medicine

## 2020-11-24 ENCOUNTER — Other Ambulatory Visit: Payer: Self-pay

## 2020-11-24 ENCOUNTER — Ambulatory Visit: Payer: Medicare Other

## 2020-11-24 DIAGNOSIS — Z0000 Encounter for general adult medical examination without abnormal findings: Secondary | ICD-10-CM

## 2020-11-27 DIAGNOSIS — M6281 Muscle weakness (generalized): Secondary | ICD-10-CM | POA: Diagnosis not present

## 2020-11-27 DIAGNOSIS — J9611 Chronic respiratory failure with hypoxia: Secondary | ICD-10-CM | POA: Diagnosis not present

## 2020-11-27 DIAGNOSIS — R269 Unspecified abnormalities of gait and mobility: Secondary | ICD-10-CM | POA: Diagnosis not present

## 2020-11-29 ENCOUNTER — Other Ambulatory Visit: Payer: Self-pay | Admitting: Family Medicine

## 2020-11-30 ENCOUNTER — Other Ambulatory Visit: Payer: Self-pay | Admitting: Family Medicine

## 2020-12-25 ENCOUNTER — Ambulatory Visit: Payer: Medicare Other | Admitting: Family Medicine

## 2020-12-25 ENCOUNTER — Other Ambulatory Visit: Payer: Self-pay | Admitting: Family Medicine

## 2020-12-28 DIAGNOSIS — M6281 Muscle weakness (generalized): Secondary | ICD-10-CM | POA: Diagnosis not present

## 2020-12-28 DIAGNOSIS — R269 Unspecified abnormalities of gait and mobility: Secondary | ICD-10-CM | POA: Diagnosis not present

## 2020-12-28 DIAGNOSIS — J9611 Chronic respiratory failure with hypoxia: Secondary | ICD-10-CM | POA: Diagnosis not present

## 2021-01-12 ENCOUNTER — Telehealth: Payer: Self-pay

## 2021-01-12 NOTE — Telephone Encounter (Signed)
Called patient 3 x times for MWV scheduled for 315 today no answer left voice mail to call reschedule appointment . L.Maxmillian Carsey,LPN

## 2021-01-26 ENCOUNTER — Other Ambulatory Visit: Payer: Self-pay | Admitting: Family Medicine

## 2021-01-26 DIAGNOSIS — J449 Chronic obstructive pulmonary disease, unspecified: Secondary | ICD-10-CM

## 2021-01-27 DIAGNOSIS — M6281 Muscle weakness (generalized): Secondary | ICD-10-CM | POA: Diagnosis not present

## 2021-01-27 DIAGNOSIS — R269 Unspecified abnormalities of gait and mobility: Secondary | ICD-10-CM | POA: Diagnosis not present

## 2021-01-27 DIAGNOSIS — J9611 Chronic respiratory failure with hypoxia: Secondary | ICD-10-CM | POA: Diagnosis not present

## 2021-02-27 DIAGNOSIS — J9611 Chronic respiratory failure with hypoxia: Secondary | ICD-10-CM | POA: Diagnosis not present

## 2021-02-27 DIAGNOSIS — M6281 Muscle weakness (generalized): Secondary | ICD-10-CM | POA: Diagnosis not present

## 2021-02-27 DIAGNOSIS — R269 Unspecified abnormalities of gait and mobility: Secondary | ICD-10-CM | POA: Diagnosis not present

## 2021-03-16 ENCOUNTER — Other Ambulatory Visit: Payer: Self-pay | Admitting: Family Medicine

## 2021-03-16 DIAGNOSIS — J449 Chronic obstructive pulmonary disease, unspecified: Secondary | ICD-10-CM

## 2021-03-20 ENCOUNTER — Other Ambulatory Visit: Payer: Self-pay | Admitting: Family Medicine

## 2021-03-30 ENCOUNTER — Telehealth: Payer: Self-pay

## 2021-03-30 DIAGNOSIS — J9611 Chronic respiratory failure with hypoxia: Secondary | ICD-10-CM | POA: Diagnosis not present

## 2021-03-30 DIAGNOSIS — R269 Unspecified abnormalities of gait and mobility: Secondary | ICD-10-CM | POA: Diagnosis not present

## 2021-03-30 DIAGNOSIS — M6281 Muscle weakness (generalized): Secondary | ICD-10-CM | POA: Diagnosis not present

## 2021-03-30 NOTE — Telephone Encounter (Signed)
Called patient x 3 with no answer.  Patient can reschedule for the next available appointment.  L.Britnie Colville,LPN

## 2021-03-30 NOTE — Progress Notes (Deleted)
Subjective:   Yousra Ivens is a 75 y.o. female who presents for an Initial Medicare Annual Wellness Visit.  Review of Systems    N/a       Objective:    There were no vitals filed for this visit. There is no height or weight on file to calculate BMI.  Advanced Directives 11/24/2017 11/23/2017 10/16/2013  Does Patient Have a Medical Advance Directive? No No Patient does not have advance directive;Patient would not like information  Would patient like information on creating a medical advance directive? Yes (Inpatient - patient requests chaplain consult to create a medical advance directive) No - Patient declined -  Pre-existing out of facility DNR order (yellow form or pink MOST form) - - No    Current Medications (verified) Outpatient Encounter Medications as of 03/30/2021  Medication Sig   Accu-Chek Softclix Lancets lancets Use to check blood sugars 1-2 times daily. Dx:e11.9   acetaminophen (TYLENOL) 500 MG tablet Take 1,000 mg by mouth every 6 (six) hours as needed for mild pain.   aspirin EC 81 MG EC tablet Take 1 tablet (81 mg total) by mouth daily.   atorvastatin (LIPITOR) 80 MG tablet TAKE 1 TABLET (80 MG TOTAL) BY MOUTH DAILY AT 6 PM. KEEP OFFICE VISIT   Blood Glucose Monitoring Suppl (ACCU-CHEK AVIVA PLUS) w/Device KIT Use to test blood sugars 1-2 times daily. Dx: E11.9   busPIRone (BUSPAR) 7.5 MG tablet Take 1 tablet (7.5 mg total) by mouth 2 (two) times daily.   cetirizine (ZYRTEC) 10 MG tablet Take 10 mg by mouth daily.   escitalopram (LEXAPRO) 10 MG tablet TAKE 1 AND 1/2 TABLETS BY MOUTH DAILY   fenofibrate 54 MG tablet TAKE 1 TABLET BY MOUTH DAILY   ferrous gluconate (FERGON) 324 MG tablet TAKE 1 TABLET BY MOUTH TWICE A DAY WITH MEALS   folic acid (FOLVITE) 1 MG tablet TAKE 1 TABLET BY MOUTH EVERY DAY   furosemide (LASIX) 40 MG tablet TAKE 1 TABLET BY MOUTH EVERY DAY   glipiZIDE (GLUCOTROL) 5 MG tablet TAKE 1 TABLET (5 MG TOTAL) BY MOUTH DAILY BEFORE BREAKFAST.  20-30 MINUTES BEFORE MEAL.   glucose blood (ACCU-CHEK AVIVA PLUS) test strip Use to check blood sugars 4 times daily. Dx:E11.9   KLOR-CON M20 20 MEQ tablet TAKE 1 TABLET BY MOUTH EVERY DAY   metFORMIN (GLUCOPHAGE) 500 MG tablet Take 1 tablet (500 mg total) by mouth 2 (two) times daily with a meal.   montelukast (SINGULAIR) 10 MG tablet Take 1 tablet (10 mg total) by mouth at bedtime.   Multiple Vitamin (MULTIVITAMIN WITH MINERALS) TABS tablet Take 1 tablet by mouth daily.   nebivolol (BYSTOLIC) 5 MG tablet TAKE 1 TABLET BY MOUTH EVERY DAY   nitroGLYCERIN (NITROSTAT) 0.4 MG SL tablet Place 1 tablet (0.4 mg total) under the tongue every 5 (five) minutes as needed for chest pain.   omega-3 acid ethyl esters (LOVAZA) 1 g capsule Take 1 capsule (1 g total) by mouth 2 (two) times daily.   PROAIR HFA 108 (90 Base) MCG/ACT inhaler INHALE 1 PUFF EVERY 4 HOURS AS NEEDED FOR WHEEZE OR FOR SHORTNESS OF BREATH   ranitidine (ZANTAC) 150 MG tablet Take 150 mg by mouth 2 (two) times daily.   tiotropium (SPIRIVA HANDIHALER) 18 MCG inhalation capsule INHALE 1 CAPSULE VIA HANDIHALER ONCE DAILY AT THE SAME TIME EVERY DAY   triamcinolone (NASACORT ALLERGY 24HR) 55 MCG/ACT AERO nasal inhaler Place 2 sprays into the nose daily.   vitamin  B-12 1000 MCG tablet Take 1 tablet (1,000 mcg total) by mouth daily.   No facility-administered encounter medications on file as of 03/30/2021.    Allergies (verified) Patient has no known allergies.   History: Past Medical History:  Diagnosis Date   Allergy    Asthma    CAD (coronary artery disease)    COPD (chronic obstructive pulmonary disease) (HCC)    Depression    GERD (gastroesophageal reflux disease)    Hyperlipidemia    Hypertension    Myocardial infarction ([place]) 10/16/2013   Past Surgical History:  Procedure Laterality Date   CARDIAC CATHETERIZATION  10/17/13   significant 3 vessel disease   LEFT HEART CATHETERIZATION WITH CORONARY ANGIOGRAM N/A 10/17/2013    Procedure: LEFT HEART CATHETERIZATION WITH CORONARY ANGIOGRAM;  Surgeon: Sinclair Grooms, MD;  Location: [facility];  Service: Cardiovascular;  Laterality: N/A;   Family History  Problem Relation Age of Onset   Allergies Mother    Breast cancer Mother    Arthritis Mother    Cancer Mother    Hyperlipidemia Mother    Hypertension Mother    Heart disease Father    Heart disease Brother    Arthritis Brother    Cancer Brother    Depression Brother    Drug abuse Brother    Hyperlipidemia Brother    Heart attack Brother    Rheum arthritis Brother    Cancer Sister    Dementia Sister    Social History   Socioeconomic History   Marital status: Widowed    Spouse name: Not on file   Number of children: Not on file   Years of education: Not on file   Highest education level: Not on file  Occupational History   Not on file  Tobacco Use   Smoking status: Former    Packs/day: 1.00    Years: 45.00    Pack years: 45.00    Types: Cigarettes    Quit date: 10/16/2013    Years since quitting: 7.4   Smokeless tobacco: Never  Vaping Use   Vaping Use: Never used  Substance and Sexual Activity   Alcohol use: Yes    Comment: occasional   Drug use: No   Sexual activity: Not Currently  Other Topics Concern   Not on file  Social History Narrative   Not on file   Social Determinants of Health   Financial Resource Strain: Not on file  Food Insecurity: Not on file  Transportation Needs: Not on file  Physical Activity: Not on file  Stress: Not on file  Social Connections: Not on file    Tobacco Counseling Counseling given: Not Answered   Clinical Intake:                 Diabetic?yes Nutrition Risk Assessment:  Has the patient had any N/V/D within the last 2 months?  {YES/NO:21197} Does the patient have any non-healing wounds?  {YES/NO:21197} Has the patient had any unintentional weight loss or weight gain?  {YES/NO:21197}  Diabetes:  Is the patient diabetic?   {YES/NO:21197} If diabetic, was a CBG obtained today?  {YES/NO:21197} Did the patient bring in their glucometer from home?  {YES/NO:21197} How often do you monitor your CBG's? ***.   Financial Strains and Diabetes Management:  Are you having any financial strains with the device, your supplies or your medication? {YES/NO:21197}.  Does the patient want to be seen by Chronic Care Management for management of their diabetes?  {YES/NO:21197} Would the patient  like to be referred to a Nutritionist or for Diabetic Management?  {YES/NO:21197}  Diabetic Exams:  {Diabetic Eye Exam:2101801} {Diabetic Foot Exam:2101802}          Activities of Daily Living No flowsheet data found.  Patient Care Team: Martinique, Betty G, MD as PCP - General (Family Medicine) Croitoru, Dani Gobble, MD as PCP - Cardiology (Cardiology)  Indicate any recent Medical Services you may have received from other than [facility] providers in the past year (date may be approximate).     Assessment:   This is a routine wellness examination for [place].  Hearing/Vision screen No results found.  Dietary issues and exercise activities discussed:     Goals Addressed   None    Depression Screen No flowsheet data found.  Fall Risk No flowsheet data found.  FALL RISK PREVENTION PERTAINING TO THE HOME:  Any stairs in or around the home? {YES/NO:21197} If so, are there any without handrails? {YES/NO:21197} Home free of loose throw rugs in walkways, pet beds, electrical cords, etc? {YES/NO:21197} Adequate lighting in your home to reduce risk of falls? {YES/NO:21197}  ASSISTIVE DEVICES UTILIZED TO PREVENT FALLS:  Life alert? {YES/NO:21197} Use of a cane, walker or w/c? {YES/NO:21197} Grab bars in the bathroom? {YES/NO:21197} Shower chair or bench in shower? {YES/NO:21197} Elevated toilet seat or a handicapped toilet? {YES/NO:21197}  TIMED UP AND GO:  Was the test performed? {YES/NO:21197}.  Length of time to ambulate  10 feet: *** sec.   {Appearance of VFIE:3329518}  Cognitive Function:        Immunizations Immunization History  Administered Date(s) Administered   Influenza Split 05/06/2013   Influenza-Unspecified 05/25/2018   Pneumococcal Polysaccharide-23 11/27/2017    {TDAP status:2101805}  {Flu Vaccine status:2101806}  {Pneumococcal vaccine status:2101807}  {Covid-19 vaccine status:2101808}  Qualifies for Shingles Vaccine? {YES/NO:21197}  Zostavax completed {YES/NO:21197}  {Shingrix Completed?:2101804}  Screening Tests Health Maintenance  Topic Date Due   COVID-19 Vaccine (1) Never done   FOOT EXAM  Never done   OPHTHALMOLOGY EXAM  Never done   URINE MICROALBUMIN  Never done   TETANUS/TDAP  Never done   COLONOSCOPY (Pts 45-55yrs Insurance coverage will need to be confirmed)  Never done   MAMMOGRAM  Never done   Zoster Vaccines- Shingrix (1 of 2) Never done   DEXA SCAN  Never done   HEMOGLOBIN A1C  10/14/2020   INFLUENZA VACCINE  02/22/2021   Hepatitis C Screening  Completed   HPV VACCINES  Aged Out   PNA vac Low Risk Adult  Discontinued    Health Maintenance  Health Maintenance Due  Topic Date Due   COVID-19 Vaccine (1) Never done   FOOT EXAM  Never done   OPHTHALMOLOGY EXAM  Never done   URINE MICROALBUMIN  Never done   TETANUS/TDAP  Never done   COLONOSCOPY (Pts 45-51yrs Insurance coverage will need to be confirmed)  Never done   MAMMOGRAM  Never done   Zoster Vaccines- Shingrix (1 of 2) Never done   DEXA SCAN  Never done   HEMOGLOBIN A1C  10/14/2020   INFLUENZA VACCINE  02/22/2021    {Colorectal cancer screening:2101809}  {Mammogram status:21018020}  {Bone Density status:21018021}  Lung Cancer Screening: (Low Dose CT Chest recommended if Age 24-80 years, 30 pack-year currently smoking OR have quit w/in 15years.) {DOES NOT does:27190::"does not"} qualify.   Lung Cancer Screening Referral: ***  Additional Screening:  Hepatitis C Screening: {DOES NOT  does:27190::"does not"} qualify; Completed ***  Vision Screening: Recommended annual ophthalmology exams for early  detection of glaucoma and other disorders of the eye. Is the patient up to date with their annual eye exam?  {YES/NO:21197} Who is the provider or what is the name of the office in which the patient attends annual eye exams? *** If pt is not established with a provider, would they like to be referred to a provider to establish care? {YES/NO:21197}.   Dental Screening: Recommended annual dental exams for proper oral hygiene  Community Resource Referral / Chronic Care Management: CRR required this visit?  {YES/NO:21197}  CCM required this visit?  {YES/NO:21197}     Plan:     I have personally reviewed and noted the following in the patient's chart:   Medical and social history Use of alcohol, tobacco or illicit drugs  Current medications and supplements including opioid prescriptions. {Opioid Prescriptions:[phone]} Functional ability and status Nutritional status Physical activity Advanced directives List of other physicians Hospitalizations, surgeries, and ER visits in previous 12 months Vitals Screenings to include cognitive, depression, and falls Referrals and appointments  In addition, I have reviewed and discussed with patient certain preventive protocols, quality metrics, and best practice recommendations. A written personalized care plan for preventive services as well as general preventive health recommendations were provided to patient.     Randel Pigg, LPN   10/25/8766   Nurse Notes: ***

## 2021-04-12 ENCOUNTER — Other Ambulatory Visit: Payer: Self-pay | Admitting: Family Medicine

## 2021-04-12 DIAGNOSIS — J449 Chronic obstructive pulmonary disease, unspecified: Secondary | ICD-10-CM

## 2021-04-19 NOTE — Telephone Encounter (Signed)
Scheduled 04/29/21

## 2021-04-20 ENCOUNTER — Other Ambulatory Visit: Payer: Self-pay | Admitting: Family Medicine

## 2021-04-29 ENCOUNTER — Ambulatory Visit (INDEPENDENT_AMBULATORY_CARE_PROVIDER_SITE_OTHER): Payer: Medicare Other

## 2021-04-29 DIAGNOSIS — Z780 Asymptomatic menopausal state: Secondary | ICD-10-CM | POA: Diagnosis not present

## 2021-04-29 DIAGNOSIS — Z1231 Encounter for screening mammogram for malignant neoplasm of breast: Secondary | ICD-10-CM | POA: Diagnosis not present

## 2021-04-29 DIAGNOSIS — Z0000 Encounter for general adult medical examination without abnormal findings: Secondary | ICD-10-CM

## 2021-04-29 DIAGNOSIS — Z1211 Encounter for screening for malignant neoplasm of colon: Secondary | ICD-10-CM | POA: Diagnosis not present

## 2021-04-29 NOTE — Patient Instructions (Signed)
[name] , Thank you for taking time to come for your Medicare Wellness Visit. I appreciate your ongoing commitment to your health goals. Please review the following plan we discussed and let me know if I can assist you in the future.   Screening recommendations/referrals: Colonoscopy: referral 04/29/2021 Mammogram: referral 04/29/2021 Bone Density: referral 04/29/2021 Recommended yearly ophthalmology/optometry visit for glaucoma screening and checkup Recommended yearly dental visit for hygiene and checkup  Vaccinations: Influenza vaccine: due fall 0222  Pneumococcal vaccine: completed  Tdap vaccine: due with injury  Shingles vaccine: declined     Advanced directives: none   Conditions/risks identified: none   Next appointment: none    Preventive Care 65 Years and Older, Female Preventive care refers to lifestyle choices and visits with your health care provider that can promote health and wellness. What does preventive care include? A yearly physical exam. This is also called an annual well check. Dental exams once or twice a year. Routine eye exams. Ask your health care provider how often you should have your eyes checked. Personal lifestyle choices, including: Daily care of your teeth and gums. Regular physical activity. Eating a healthy diet. Avoiding tobacco and drug use. Limiting alcohol use. Practicing safe sex. Taking low-dose aspirin every day. Taking vitamin and mineral supplements as recommended by your health care provider. What happens during an annual well check? The services and screenings done by your health care provider during your annual well check will depend on your age, overall health, lifestyle risk factors, and family history of disease. Counseling  Your health care provider may ask you questions about your: Alcohol use. Tobacco use. Drug use. Emotional well-being. Home and relationship well-being. Sexual activity. Eating habits. History of  falls. Memory and ability to understand (cognition). Work and work Astronomer. Reproductive health. Screening  You may have the following tests or measurements: Height, weight, and BMI. Blood pressure. Lipid and cholesterol levels. These may be checked every 5 years, or more frequently if you are over 97 years old. Skin check. Lung cancer screening. You may have this screening every year starting at age 88 if you have a 30-pack-year history of smoking and currently smoke or have quit within the past 15 years. Fecal occult blood test (FOBT) of the stool. You may have this test every year starting at age 25. Flexible sigmoidoscopy or colonoscopy. You may have a sigmoidoscopy every 5 years or a colonoscopy every 10 years starting at age 40. Hepatitis C blood test. Hepatitis B blood test. Sexually transmitted disease (STD) testing. Diabetes screening. This is done by checking your blood sugar (glucose) after you have not eaten for a while (fasting). You may have this done every 1-3 years. Bone density scan. This is done to screen for osteoporosis. You may have this done starting at age 37. Mammogram. This may be done every 1-2 years. Talk to your health care provider about how often you should have regular mammograms. Talk with your health care provider about your test results, treatment options, and if necessary, the need for more tests. Vaccines  Your health care provider may recommend certain vaccines, such as: Influenza vaccine. This is recommended every year. Tetanus, diphtheria, and acellular pertussis (Tdap, Td) vaccine. You may need a Td booster every 10 years. Zoster vaccine. You may need this after age 79. Pneumococcal 13-valent conjugate (PCV13) vaccine. One dose is recommended after age 82. Pneumococcal polysaccharide (PPSV23) vaccine. One dose is recommended after age 99. Talk to your health care provider about which screenings  and vaccines you need and how often you need  them. This information is not intended to replace advice given to you by your health care provider. Make sure you discuss any questions you have with your health care provider. Document Released: 08/07/2015 Document Revised: 03/30/2016 Document Reviewed: 05/12/2015 Elsevier Interactive Patient Education  2017 [employer] Prevention in the Home Falls can cause injuries. They can happen to people of all ages. There are many things you can do to make your home safe and to help prevent falls. What can I do on the outside of my home? Regularly fix the edges of walkways and driveways and fix any cracks. Remove anything that might make you trip as you walk through a door, such as a raised step or threshold. Trim any bushes or trees on the path to your home. Use bright outdoor lighting. Clear any walking paths of anything that might make someone trip, such as rocks or tools. Regularly check to see if handrails are loose or broken. Make sure that both sides of any steps have handrails. Any raised decks and porches should have guardrails on the edges. Have any leaves, snow, or ice cleared regularly. Use sand or salt on walking paths during winter. Clean up any spills in your garage right away. This includes oil or grease spills. What can I do in the bathroom? Use night lights. Install grab bars by the toilet and in the tub and shower. Do not use towel bars as grab bars. Use non-skid mats or decals in the tub or shower. If you need to sit down in the shower, use a plastic, non-slip stool. Keep the floor dry. Clean up any water that spills on the floor as soon as it happens. Remove soap buildup in the tub or shower regularly. Attach bath mats securely with double-sided non-slip rug tape. Do not have throw rugs and other things on the floor that can make you trip. What can I do in the bedroom? Use night lights. Make sure that you have a light by your bed that is easy to reach. Do not use  any sheets or blankets that are too big for your bed. They should not hang down onto the floor. Have a firm chair that has side arms. You can use this for support while you get dressed. Do not have throw rugs and other things on the floor that can make you trip. What can I do in the kitchen? Clean up any spills right away. Avoid walking on wet floors. Keep items that you use a lot in easy-to-reach places. If you need to reach something above you, use a strong step stool that has a grab bar. Keep electrical cords out of the way. Do not use floor polish or wax that makes floors slippery. If you must use wax, use non-skid floor wax. Do not have throw rugs and other things on the floor that can make you trip. What can I do with my stairs? Do not leave any items on the stairs. Make sure that there are handrails on both sides of the stairs and use them. Fix handrails that are broken or loose. Make sure that handrails are as long as the stairways. Check any carpeting to make sure that it is firmly attached to the stairs. Fix any carpet that is loose or worn. Avoid having throw rugs at the top or bottom of the stairs. If you do have throw rugs, attach them to the floor with carpet tape.  Make sure that you have a light switch at the top of the stairs and the bottom of the stairs. If you do not have them, ask someone to add them for you. What else can I do to help prevent falls? Wear shoes that: Do not have high heels. Have rubber bottoms. Are comfortable and fit you well. Are closed at the toe. Do not wear sandals. If you use a stepladder: Make sure that it is fully opened. Do not climb a closed stepladder. Make sure that both sides of the stepladder are locked into place. Ask someone to hold it for you, if possible. Clearly mark and make sure that you can see: Any grab bars or handrails. First and last steps. Where the edge of each step is. Use tools that help you move around (mobility aids)  if they are needed. These include: Canes. Walkers. Scooters. Crutches. Turn on the lights when you go into a dark area. Replace any light bulbs as soon as they burn out. Set up your furniture so you have a clear path. Avoid moving your furniture around. If any of your floors are uneven, fix them. If there are any pets around you, be aware of where they are. Review your medicines with your doctor. Some medicines can make you feel dizzy. This can increase your chance of falling. Ask your doctor what other things that you can do to help prevent falls. This information is not intended to replace advice given to you by your health care provider. Make sure you discuss any questions you have with your health care provider. Document Released: 05/07/2009 Document Revised: 12/17/2015 Document Reviewed: 08/15/2014 Elsevier Interactive Patient Education  2017 [employer].

## 2021-04-29 NOTE — Progress Notes (Signed)
Subjective:   Beverly Wheeler is a 75 y.o. female who presents for an Initial Medicare Annual Wellness Visit.  I connected with Amyiah Gaba today by telephone and verified that I am speaking with the correct person using two identifiers. Location patient: home Location provider: work Persons participating in the virtual visit: patient, provider.   I discussed the limitations, risks, security and privacy concerns of performing an evaluation and management service by telephone and the availability of in person appointments. I also discussed with the patient that there may be a patient responsible charge related to this service. The patient expressed understanding and verbally consented to this telephonic visit.    Interactive audio and video telecommunications were attempted between this provider and patient, however failed, due to patient having technical difficulties OR patient did not have access to video capability.  We continued and completed visit with audio only.    Review of Systems    N/a Cardiac Risk Factors include: advanced age (>53mn, >>77women);diabetes mellitus;dyslipidemia;hypertension     Objective:    Today's Vitals   There is no height or weight on file to calculate BMI.  Advanced Directives 04/29/2021 11/24/2017 11/23/2017 10/16/2013  Does Patient Have a Medical Advance Directive? No No No Patient does not have advance directive;Patient would not like information  Would patient like information on creating a medical advance directive? No - Patient declined Yes (Inpatient - patient requests chaplain consult to create a medical advance directive) No - Patient declined -  Pre-existing out of facility DNR order (yellow form or pink MOST form) - - - No    Current Medications (verified) Outpatient Encounter Medications as of 04/29/2021  Medication Sig   Accu-Chek Softclix Lancets lancets Use to check blood sugars 1-2 times daily. Dx:e11.9   acetaminophen (TYLENOL)  500 MG tablet Take 1,000 mg by mouth every 6 (six) hours as needed for mild pain.   aspirin EC 81 MG EC tablet Take 1 tablet (81 mg total) by mouth daily.   atorvastatin (LIPITOR) 80 MG tablet TAKE 1 TABLET (80 MG TOTAL) BY MOUTH DAILY AT 6 PM. KEEP OFFICE VISIT   Blood Glucose Monitoring Suppl (ACCU-CHEK AVIVA PLUS) w/Device KIT Use to test blood sugars 1-2 times daily. Dx: E11.9   busPIRone (BUSPAR) 7.5 MG tablet Take 1 tablet (7.5 mg total) by mouth 2 (two) times daily.   cetirizine (ZYRTEC) 10 MG tablet Take 10 mg by mouth daily.   escitalopram (LEXAPRO) 10 MG tablet TAKE 1 AND 1/2 TABLETS BY MOUTH DAILY   fenofibrate 54 MG tablet TAKE 1 TABLET BY MOUTH DAILY   ferrous gluconate (FERGON) 324 MG tablet TAKE 1 TABLET BY MOUTH TWICE A DAY WITH MEALS   folic acid (FOLVITE) 1 MG tablet TAKE 1 TABLET BY MOUTH EVERY DAY   furosemide (LASIX) 40 MG tablet TAKE 1 TABLET BY MOUTH EVERY DAY   glipiZIDE (GLUCOTROL) 5 MG tablet TAKE 1 TABLET (5 MG TOTAL) BY MOUTH DAILY BEFORE BREAKFAST. 20-30 MINUTES BEFORE MEAL.   glucose blood (ACCU-CHEK AVIVA PLUS) test strip Use to check blood sugars 4 times daily. Dx:E11.9   KLOR-CON M20 20 MEQ tablet TAKE 1 TABLET BY MOUTH EVERY DAY   metFORMIN (GLUCOPHAGE) 500 MG tablet Take 1 tablet (500 mg total) by mouth 2 (two) times daily with a meal.   montelukast (SINGULAIR) 10 MG tablet Take 1 tablet (10 mg total) by mouth at bedtime.   Multiple Vitamin (MULTIVITAMIN WITH MINERALS) TABS tablet Take 1 tablet by mouth  daily.   nebivolol (BYSTOLIC) 5 MG tablet TAKE 1 TABLET BY MOUTH EVERY DAY   nitroGLYCERIN (NITROSTAT) 0.4 MG SL tablet Place 1 tablet (0.4 mg total) under the tongue every 5 (five) minutes as needed for chest pain.   omega-3 acid ethyl esters (LOVAZA) 1 g capsule Take 1 capsule (1 g total) by mouth 2 (two) times daily.   PROAIR HFA 108 (90 Base) MCG/ACT inhaler INHALE 1 PUFF EVERY 4 HOURS AS NEEDED FOR WHEEZE OR FOR SHORTNESS OF BREATH   ranitidine (ZANTAC) 150  MG tablet Take 150 mg by mouth 2 (two) times daily.   tiotropium (SPIRIVA HANDIHALER) 18 MCG inhalation capsule INHALE 1 CAPSULE VIA HANDIHALER ONCE DAILY AT THE SAME TIME EVERY DAY   triamcinolone (NASACORT ALLERGY 24HR) 55 MCG/ACT AERO nasal inhaler Place 2 sprays into the nose daily.   vitamin B-12 1000 MCG tablet Take 1 tablet (1,000 mcg total) by mouth daily.   No facility-administered encounter medications on file as of 04/29/2021.    Allergies (verified) Patient has no known allergies.   History: Past Medical History:  Diagnosis Date   Allergy    Asthma    CAD (coronary artery disease)    COPD (chronic obstructive pulmonary disease) (HCC)    Depression    GERD (gastroesophageal reflux disease)    Hyperlipidemia    Hypertension    Myocardial infarction ([place]) 10/16/2013   Past Surgical History:  Procedure Laterality Date   CARDIAC CATHETERIZATION  10/17/13   significant 3 vessel disease   LEFT HEART CATHETERIZATION WITH CORONARY ANGIOGRAM N/A 10/17/2013   Procedure: LEFT HEART CATHETERIZATION WITH CORONARY ANGIOGRAM;  Surgeon: Sinclair Grooms, MD;  Location[facility] CATH LAB;  Service: Cardiovascular;  Laterality: N/A;   Family History  Problem Relation Age of Onset   Allergies Mother    Breast cancer Mother    Arthritis Mother    Cancer Mother    Hyperlipidemia Mother    Hypertension Mother    Heart disease Father    Heart disease Brother    Arthritis Brother    Cancer Brother    Depression Brother    Drug abuse Brother    Hyperlipidemia Brother    Heart attack Brother    Rheum arthritis Brother    Cancer Sister    Dementia Sister    Social History   Socioeconomic History   Marital status: Widowed    Spouse name: Not on file   Number of children: Not on file   Years of education: Not on file   Highest education level: Not on file  Occupational History   Not on file  Tobacco Use   Smoking status: Former    Packs/day: 1.00    Years: 45.00    Pack years:  45.00    Types: Cigarettes    Quit date: 10/16/2013    Years since quitting: 7.5   Smokeless tobacco: Never  Vaping Use   Vaping Use: Never used  Substance and Sexual Activity   Alcohol use: Yes    Comment: occasional   Drug use: No   Sexual activity: Not Currently  Other Topics Concern   Not on file  Social History Narrative   Not on file   Social Determinants of Health   Financial Resource Strain: Low Risk    Difficulty of Paying Living Expenses: Not hard at all  Food Insecurity: No Food Insecurity   Worried About [place] in the Last Year: Never true   Ran  Out of Food in the Last Year: Never true  Transportation Needs: No Transportation Needs   Lack of Transportation (Medical): No   Lack of Transportation (Non-Medical): No  Physical Activity: Inactive   Days of Exercise per Week: 0 days   Minutes of Exercise per Session: 0 min  Stress: No Stress Concern Present   Feeling of Stress : Not at all  Social Connections: Socially Isolated   Frequency of Communication with Friends and Family: Twice a week   Frequency of Social Gatherings with Friends and Family: Twice a week   Attends Religious Services: Never   Marine scientist or Organizations: No   Attends Archivist Meetings: Never   Marital Status: Widowed    Tobacco Counseling Counseling given: Not Answered   Clinical Intake:  Pre-visit preparation completed: Yes  Pain : No/denies pain     Nutritional Risks: None Diabetes: Yes CBG done?: No Did pt. bring in CBG monitor from home?: No  How often do you need to have someone help you when you read instructions, pamphlets, or other written materials from your doctor or pharmacy?: 1 - Never What is the last grade level you completed in school?: college  Diabetic?yes Nutrition Risk Assessment:  Has the patient had any N/V/D within the last 2 months?  No  Does the patient have any non-healing wounds?  No  Has the patient had any  unintentional weight loss or weight gain?  No   Diabetes:  Is the patient diabetic?  Yes  If diabetic, was a CBG obtained today?  No  Did the patient bring in their glucometer from home?  No  How often do you monitor your CBG's? 3 x aday .   Financial Strains and Diabetes Management:  Are you having any financial strains with the device, your supplies or your medication? No .  Does the patient want to be seen by Chronic Care Management for management of their diabetes?  No  Would the patient like to be referred to a Nutritionist or for Diabetic Management?  No   Diabetic Exams:  Diabetic Eye Exam: Overdue for diabetic eye exam. Pt has been advised about the importance in completing this exam. Patient advised to call and schedule an eye exam. Diabetic Foot Exam: Overdue, Pt has been advised about the importance in completing this exam. Pt is scheduled for diabetic foot exam on next office visit .   Interpreter Needed?: No  Information entered by :: [place] of Daily Living In your present state of health, do you have any difficulty performing the following activities: 04/29/2021  Hearing? Y  Vision? N  Difficulty concentrating or making decisions? N  Walking or climbing stairs? Y  Dressing or bathing? N  Doing errands, shopping? N  Preparing Food and eating ? N  Using the Toilet? N  In the past six months, have you accidently leaked urine? N  Do you have problems with loss of bowel control? N  Managing your Medications? N  Managing your Finances? N  Housekeeping or managing your Housekeeping? N  Some recent data might be hidden    Patient Care Team: Martinique, Betty G, MD as PCP - General (Family Medicine) Croitoru, Dani Gobble, MD as PCP - Cardiology (Cardiology)  Indicate any recent Medical Services you may have received from other than [facility] providers in the past year (date may be approximate).     Assessment:   This is a routine wellness examination for  [place].  Hearing/Vision screen Vision Screening - Comments:: Annual eye exams wears glasses   Dietary issues and exercise activities discussed: Current Exercise Habits: The patient does not participate in regular exercise at present, Exercise limited by: cardiac condition(s)   Goals Addressed   None    Depression Screen PHQ 2/9 Scores 04/29/2021 04/29/2021  PHQ - 2 Score 0 0    Fall Risk Fall Risk  04/29/2021  Falls in the past year? 0  Number falls in past yr: 0  Injury with Fall? 0  Comment uses walker  Risk for fall due to : Impaired balance/gait  Follow up Falls evaluation completed;Education provided    FALL RISK PREVENTION PERTAINING TO THE HOME:  Any stairs in or around the home? No  If so, are there any without handrails? No  Home free of loose throw rugs in walkways, pet beds, electrical cords, etc? Yes  Adequate lighting in your home to reduce risk of falls? Yes   ASSISTIVE DEVICES UTILIZED TO PREVENT FALLS:  Life alert? No  Use of a cane, walker or w/c? Yes  Grab bars in the bathroom? Yes  Shower chair or bench in shower? Yes  Elevated toilet seat or a handicapped toilet? No     Cognitive Function:    Normal cognitive status assessed by direct observation by this Nurse Health Advisor. No abnormalities found.      Immunizations Immunization History  Administered Date(s) Administered   Influenza Split 05/06/2013   Influenza-Unspecified 05/25/2018   Pneumococcal Polysaccharide-23 11/27/2017    TDAP status: Due, Education has been provided regarding the importance of this vaccine. Advised may receive this vaccine at local pharmacy or Health Dept. Aware to provide a copy of the vaccination record if obtained from local pharmacy or Health Dept. Verbalized acceptance and understanding.  Flu Vaccine status: Due, Education has been provided regarding the importance of this vaccine. Advised may receive this vaccine at local pharmacy or Health Dept. Aware to  provide a copy of the vaccination record if obtained from local pharmacy or Health Dept. Verbalized acceptance and understanding.  Pneumococcal vaccine status: Up to date  Covid-19 vaccine status: Declined, Education has been provided regarding the importance of this vaccine but patient still declined. Advised may receive this vaccine at local pharmacy or Health Dept.or vaccine clinic. Aware to provide a copy of the vaccination record if obtained from local pharmacy or Health Dept. Verbalized acceptance and understanding.  Qualifies for Shingles Vaccine? Yes   Zostavax completed No   Shingrix Completed?: No.    Education has been provided regarding the importance of this vaccine. Patient has been advised to call insurance company to determine out of pocket expense if they have not yet received this vaccine. Advised may also receive vaccine at local pharmacy or Health Dept. Verbalized acceptance and understanding.  Screening Tests Health Maintenance  Topic Date Due   COVID-19 Vaccine (1) Never done   FOOT EXAM  Never done   OPHTHALMOLOGY EXAM  Never done   URINE MICROALBUMIN  Never done   TETANUS/TDAP  Never done   COLONOSCOPY (Pts 45-32yr Insurance coverage will need to be confirmed)  Never done   MAMMOGRAM  Never done   Zoster Vaccines- Shingrix (1 of 2) Never done   DEXA SCAN  Never done   HEMOGLOBIN A1C  10/14/2020   INFLUENZA VACCINE  02/22/2021   Hepatitis C Screening  Completed   HPV VACCINES  Aged Out    Health Maintenance  Health Maintenance Due  Topic Date  Due   COVID-19 Vaccine (1) Never done   FOOT EXAM  Never done   OPHTHALMOLOGY EXAM  Never done   URINE MICROALBUMIN  Never done   TETANUS/TDAP  Never done   COLONOSCOPY (Pts 45-95yr Insurance coverage will need to be confirmed)  Never done   MAMMOGRAM  Never done   Zoster Vaccines- Shingrix (1 of 2) Never done   DEXA SCAN  Never done   HEMOGLOBIN A1C  10/14/2020   INFLUENZA VACCINE  02/22/2021    Colorectal  cancer screening: Referral to GI placed 04/29/2021. Pt aware the office will call re: appt.  Mammogram status: Ordered 04/29/2021. Pt provided with contact info and advised to call to schedule appt.   Bone Density status: Ordered 04/29/2021. Pt provided with contact info and advised to call to schedule appt.  Lung Cancer Screening: (Low Dose CT Chest recommended if Age 73-80years, 30 pack-year currently smoking OR have quit w/in 15years.) does not qualify.   Lung Cancer Screening Referral: n/a  Additional Screening:  Hepatitis C Screening: does not qualify; Completed 04/16/2020  Vision Screening: Recommended annual ophthalmology exams for early detection of glaucoma and other disorders of the eye. Is the patient up to date with their annual eye exam?  Yes  Who is the provider or what is the name of the office in which the patient attends annual eye exams? [facility]  If pt is not established with a provider, would they like to be referred to a provider to establish care? No .   Dental Screening: Recommended annual dental exams for proper oral hygiene  Community Resource Referral / Chronic Care Management: CRR required this visit?  No   CCM required this visit?  No      Plan:     I have personally reviewed and noted the following in the patient's chart:   Medical and social history Use of alcohol, tobacco or illicit drugs  Current medications and supplements including opioid prescriptions. Patient is not currently taking opioid prescriptions. Functional ability and status Nutritional status Physical activity Advanced directives List of other physicians Hospitalizations, surgeries, and ER visits in previous 12 months Vitals Screenings to include cognitive, depression, and falls Referrals and appointments  In addition, I have reviewed and discussed with patient certain preventive protocols, quality metrics, and best practice recommendations. A written personalized care plan  for preventive services as well as general preventive health recommendations were provided to patient.     LRandel Pigg LPN   [id number]  Nurse Notes: none

## 2021-05-13 ENCOUNTER — Other Ambulatory Visit: Payer: Self-pay | Admitting: Family Medicine

## 2021-05-13 DIAGNOSIS — E1169 Type 2 diabetes mellitus with other specified complication: Secondary | ICD-10-CM

## 2021-05-19 ENCOUNTER — Other Ambulatory Visit: Payer: Self-pay | Admitting: Family Medicine

## 2021-05-19 DIAGNOSIS — J449 Chronic obstructive pulmonary disease, unspecified: Secondary | ICD-10-CM

## 2021-05-21 ENCOUNTER — Other Ambulatory Visit: Payer: Self-pay | Admitting: Family Medicine

## 2021-05-21 DIAGNOSIS — J449 Chronic obstructive pulmonary disease, unspecified: Secondary | ICD-10-CM

## 2021-06-12 ENCOUNTER — Other Ambulatory Visit: Payer: Self-pay | Admitting: Family Medicine

## 2021-06-19 ENCOUNTER — Other Ambulatory Visit: Payer: Self-pay | Admitting: Family Medicine

## 2021-06-19 DIAGNOSIS — E1169 Type 2 diabetes mellitus with other specified complication: Secondary | ICD-10-CM

## 2021-06-22 ENCOUNTER — Other Ambulatory Visit: Payer: Self-pay | Admitting: Family Medicine

## 2021-06-22 DIAGNOSIS — J449 Chronic obstructive pulmonary disease, unspecified: Secondary | ICD-10-CM

## 2021-06-29 ENCOUNTER — Telehealth: Payer: Self-pay | Admitting: Family Medicine

## 2021-06-29 DIAGNOSIS — D509 Iron deficiency anemia, unspecified: Secondary | ICD-10-CM

## 2021-06-29 DIAGNOSIS — E1169 Type 2 diabetes mellitus with other specified complication: Secondary | ICD-10-CM

## 2021-06-29 DIAGNOSIS — E782 Mixed hyperlipidemia: Secondary | ICD-10-CM

## 2021-06-29 DIAGNOSIS — I10 Essential (primary) hypertension: Secondary | ICD-10-CM

## 2021-06-29 NOTE — Telephone Encounter (Signed)
Orders are in - just needs lab appt! Can be up to a week prior to appt.

## 2021-06-29 NOTE — Telephone Encounter (Signed)
Patient called to schedule an appointment (it is scheduled for 12/27) and is wanting to know if there is any labs that are needing to be drawn before the appointment. She says that it has been a while since she has had them so she is wanting to get them done before her appointment so that she can discuss with Dr. [place].  Patient can be reached at [phone].  Please advise.

## 2021-07-13 ENCOUNTER — Other Ambulatory Visit: Payer: Medicare Other

## 2021-07-20 ENCOUNTER — Ambulatory Visit: Payer: Medicare Other | Admitting: Family Medicine

## 2021-07-28 ENCOUNTER — Other Ambulatory Visit: Payer: Medicare Other

## 2021-07-30 ENCOUNTER — Other Ambulatory Visit: Payer: Self-pay | Admitting: Family Medicine

## 2021-08-03 ENCOUNTER — Ambulatory Visit: Payer: Medicare Other | Admitting: Family Medicine

## 2021-08-13 ENCOUNTER — Other Ambulatory Visit: Payer: Self-pay | Admitting: Family Medicine

## 2021-08-15 ENCOUNTER — Other Ambulatory Visit: Payer: Self-pay | Admitting: Family Medicine

## 2021-08-15 DIAGNOSIS — E1169 Type 2 diabetes mellitus with other specified complication: Secondary | ICD-10-CM

## 2021-08-24 ENCOUNTER — Other Ambulatory Visit: Payer: Medicare Other

## 2021-08-30 ENCOUNTER — Other Ambulatory Visit: Payer: Medicare Other

## 2021-08-31 ENCOUNTER — Ambulatory Visit: Payer: Medicare Other | Admitting: Family Medicine

## 2021-09-17 ENCOUNTER — Other Ambulatory Visit (INDEPENDENT_AMBULATORY_CARE_PROVIDER_SITE_OTHER): Payer: Medicare Other

## 2021-09-17 DIAGNOSIS — I10 Essential (primary) hypertension: Secondary | ICD-10-CM

## 2021-09-17 DIAGNOSIS — D509 Iron deficiency anemia, unspecified: Secondary | ICD-10-CM

## 2021-09-17 DIAGNOSIS — E782 Mixed hyperlipidemia: Secondary | ICD-10-CM | POA: Diagnosis not present

## 2021-09-17 DIAGNOSIS — E1169 Type 2 diabetes mellitus with other specified complication: Secondary | ICD-10-CM

## 2021-09-17 LAB — CBC WITH DIFFERENTIAL/PLATELET
Basophils Absolute: 0.1 10*3/uL (ref 0.0–0.1)
Basophils Relative: 0.7 % (ref 0.0–3.0)
Eosinophils Absolute: 0.3 10*3/uL (ref 0.0–0.7)
Eosinophils Relative: 2.6 % (ref 0.0–5.0)
HCT: 34.6 % — ABNORMAL LOW (ref 36.0–46.0)
Hemoglobin: 11.5 g/dL — ABNORMAL LOW (ref 12.0–15.0)
Lymphocytes Relative: 24.1 % (ref 12.0–46.0)
Lymphs Abs: 2.5 10*3/uL (ref 0.7–4.0)
MCHC: 33.3 g/dL (ref 30.0–36.0)
MCV: 93.7 fl (ref 78.0–100.0)
Monocytes Absolute: 0.5 10*3/uL (ref 0.1–1.0)
Monocytes Relative: 5.1 % (ref 3.0–12.0)
Neutro Abs: 6.9 10*3/uL (ref 1.4–7.7)
Neutrophils Relative %: 67.5 % (ref 43.0–77.0)
Platelets: 222 10*3/uL (ref 150.0–400.0)
RBC: 3.69 Mil/uL — ABNORMAL LOW (ref 3.87–5.11)
RDW: 12.6 % (ref 11.5–15.5)
WBC: 10.2 10*3/uL (ref 4.0–10.5)

## 2021-09-17 LAB — COMPREHENSIVE METABOLIC PANEL
ALT: 11 U/L (ref 0–35)
AST: 14 U/L (ref 0–37)
Albumin: 4 g/dL (ref 3.5–5.2)
Alkaline Phosphatase: 63 U/L (ref 39–117)
BUN: 25 mg/dL — ABNORMAL HIGH (ref 6–23)
CO2: 46 mEq/L — ABNORMAL HIGH (ref 19–32)
Calcium: 9.3 mg/dL (ref 8.4–10.5)
Chloride: 92 mEq/L — ABNORMAL LOW (ref 96–112)
Creatinine, Ser: 1.39 mg/dL — ABNORMAL HIGH (ref 0.40–1.20)
GFR: 37.67 mL/min — ABNORMAL LOW (ref >60.00)
Glucose, Bld: 167 mg/dL — ABNORMAL HIGH (ref 70–99)
Potassium: 4.3 mEq/L (ref 3.5–5.1)
Sodium: 140 mEq/L (ref 135–145)
Total Bilirubin: 0.4 mg/dL (ref 0.2–1.2)
Total Protein: 7.1 g/dL (ref 6.0–8.3)

## 2021-09-17 LAB — MICROALBUMIN / CREATININE URINE RATIO
Creatinine,U: 170.8 mg/dL
Microalb Creat Ratio: 1 mg/g (ref 0.0–30.0)
Microalb, Ur: 1.7 mg/dL (ref 0.0–1.9)

## 2021-09-17 LAB — HEMOGLOBIN A1C: Hgb A1c MFr Bld: 6.4 % (ref 4.6–6.5)

## 2021-09-17 LAB — LIPID PANEL
Cholesterol: 204 mg/dL — ABNORMAL HIGH (ref 0–200)
HDL: 31.2 mg/dL — ABNORMAL LOW (ref >39.00)
LDL Cholesterol: 133 mg/dL — ABNORMAL HIGH (ref 0–99)
NonHDL: 173.09
Total CHOL/HDL Ratio: 7
Triglycerides: 200 mg/dL — ABNORMAL HIGH (ref 0.0–149.0)
VLDL: 40 mg/dL (ref 0.0–40.0)

## 2021-09-17 NOTE — Progress Notes (Deleted)
Beverly Wheeler is a 76 y.o.female, who is here today to follow on HTN, DM, and HLD.  Last follow up visit: 08/25/20 Hypertension:  Medications:*** BP readings at home:*** Side effects:*** Negative for unusual or severe headache, visual changes, exertional chest pain, dyspnea,  focal weakness, or edema.  Lab Results  Component Value Date   CREATININE 1.39 (H) 09/17/2021   BUN 25 (H) 09/17/2021   NA 140 09/17/2021   K 4.3 09/17/2021   CL 92 (L) 09/17/2021   CO2 46 (H) 09/17/2021    Hyperlipidemia: Currently on *** Following a low fat diet: ***. Side effects from medication:*** Lab Results  Component Value Date   CHOL 204 (H) 09/17/2021   HDL 31.20 (L) 09/17/2021   LDLCALC 133 (H) 09/17/2021   LDLDIRECT 145.0 08/10/2018   TRIG 200.0 (H) 09/17/2021   CHOLHDL 7 09/17/2021    Diabetes Mellitus II:  - Checking BG at home: *** - Medications: *** - Compliance: *** - Diet: *** - Exercise: *** - eye exam: *** - foot exam: *** - microalbumin: *** - Negative for symptoms of hypoglycemia, polyuria, polydipsia, numbness extremities, foot ulcers/trauma  Lab Results  Component Value Date   HGBA1C 6.4 09/17/2021   No results found for: MICROALBUR, [id number]   Review of Systems Rest see pertinent positives and negatives per HPI.  Current Outpatient Medications on File Prior to Visit  Medication Sig Dispense Refill   ACCU-CHEK GUIDE test strip USE TO CHECK BLOOD SUGARS 4 TIMES DAILY. DX:E11.9 100 strip 1   Accu-Chek Softclix Lancets lancets Use to check blood sugars 1-2 times daily. Dx:e11.9 100 each 12   acetaminophen (TYLENOL) 500 MG tablet Take 1,000 mg by mouth every 6 (six) hours as needed for mild pain.     albuterol (VENTOLIN HFA) 108 (90 Base) MCG/ACT inhaler INHALE 1 PUFF EVERY 4 HOURS AS NEEDED FOR WHEEZE OR FOR SHORTNESS OF BREATH 8.5 each 2   aspirin EC 81 MG EC tablet Take 1 tablet (81 mg total) by mouth daily.     atorvastatin (LIPITOR) 80 MG tablet  TAKE 1 TABLET BY MOUTH EVERY DAY AT 6PM 90 tablet 0   Blood Glucose Monitoring Suppl (ACCU-CHEK AVIVA PLUS) w/Device KIT Use to test blood sugars 1-2 times daily. Dx: E11.9 1 kit 0   busPIRone (BUSPAR) 7.5 MG tablet Take 1 tablet (7.5 mg total) by mouth 2 (two) times daily. 180 tablet 1   cetirizine (ZYRTEC) 10 MG tablet Take 10 mg by mouth daily.     escitalopram (LEXAPRO) 10 MG tablet TAKE 1 AND 1/2 TABLETS BY MOUTH DAILY 135 tablet 2   fenofibrate 54 MG tablet TAKE 1 TABLET BY MOUTH EVERY DAY 90 tablet 1   ferrous gluconate (FERGON) 324 MG tablet TAKE 1 TABLET BY MOUTH TWICE A DAY WITH MEALS 341 tablet 1   folic acid (FOLVITE) 1 MG tablet TAKE 1 TABLET BY MOUTH EVERY DAY 90 tablet 1   furosemide (LASIX) 40 MG tablet TAKE 1 TABLET BY MOUTH EVERY DAY 90 tablet 1   glipiZIDE (GLUCOTROL) 5 MG tablet TAKE 1 TABLET (5 MG TOTAL) BY MOUTH DAILY BEFORE BREAKFAST. 20-30 MINUTES BEFORE MEAL. 90 tablet 0   KLOR-CON M20 20 MEQ tablet TAKE 1 TABLET BY MOUTH EVERY DAY 90 tablet 0   metFORMIN (GLUCOPHAGE) 500 MG tablet TAKE 1 TABLET BY MOUTH 2 TIMES DAILY WITH A MEAL. 180 tablet 1   montelukast (SINGULAIR) 10 MG tablet Take 1 tablet (10 mg total) by mouth  at bedtime. 30 tablet 3   Multiple Vitamin (MULTIVITAMIN WITH MINERALS) TABS tablet Take 1 tablet by mouth daily.     nebivolol (BYSTOLIC) 5 MG tablet TAKE 1 TABLET BY MOUTH EVERY DAY 30 tablet 5   nitroGLYCERIN (NITROSTAT) 0.4 MG SL tablet Place 1 tablet (0.4 mg total) under the tongue every 5 (five) minutes as needed for chest pain. 25 tablet 3   omega-3 acid ethyl esters (LOVAZA) 1 g capsule Take 1 capsule (1 g total) by mouth 2 (two) times daily. 180 capsule 1   ranitidine (ZANTAC) 150 MG tablet Take 150 mg by mouth 2 (two) times daily.     tiotropium (SPIRIVA HANDIHALER) 18 MCG inhalation capsule INHALE 1 CAPSULE VIA HANDIHALER ONCE DAILY AT THE SAME TIME EVERY DAY 90 capsule 3   triamcinolone (NASACORT ALLERGY 24HR) 55 MCG/ACT AERO nasal inhaler Place 2  sprays into the nose daily.     vitamin B-12 1000 MCG tablet Take 1 tablet (1,000 mcg total) by mouth daily.     No current facility-administered medications on file prior to visit.     Past Medical History:  Diagnosis Date   Allergy    Asthma    CAD (coronary artery disease)    COPD (chronic obstructive pulmonary disease) (HCC)    Depression    GERD (gastroesophageal reflux disease)    Hyperlipidemia    Hypertension    Myocardial infarction ([place]) 10/16/2013    No Known Allergies  Social History   Socioeconomic History   Marital status: Widowed    Spouse name: Not on file   Number of children: Not on file   Years of education: Not on file   Highest education level: Not on file  Occupational History   Not on file  Tobacco Use   Smoking status: Former    Packs/day: 1.00    Years: 45.00    Pack years: 45.00    Types: Cigarettes    Quit date: 10/16/2013    Years since quitting: 7.9   Smokeless tobacco: Never  Vaping Use   Vaping Use: Never used  Substance and Sexual Activity   Alcohol use: Yes    Comment: occasional   Drug use: No   Sexual activity: Not Currently  Other Topics Concern   Not on file  Social History Narrative   Not on file   Social Determinants of Health   Financial Resource Strain: Low Risk    Difficulty of Paying Living Expenses: Not hard at all  Food Insecurity: No Food Insecurity   Worried About [place] fund[place] in the Last Year: Never true   [place] in the Last Year: Never true  Transportation Needs: No Transportation Needs   Lack of Transportation (Medical): No   Lack of Transportation (Non-Medical): No  Physical Activity: Inactive   Days of Exercise per Week: 0 days   Minutes of Exercise per Session: 0 min  Stress: No Stress Concern Present   Feeling of Stress : Not at all  Social Connections: Socially Isolated   Frequency of Communication with Friends and Family: Twice a week   Frequency of Social Gatherings with  Friends and Family: Twice a week   Attends Religious Services: Never   Marine scientist or Organizations: No   Attends Archivist Meetings: Never   Marital Status: Widowed    There were no vitals filed for this visit. There is no height or weight on file to calculate BMI.  Physical  Exam  ASSESSMENT AND PLAN:    There are no diagnoses linked to this encounter.  No orders of the defined types were placed in this encounter.   No problem-specific Assessment & Plan notes found for this encounter.    No follow-ups on file.   Betty G. Martinique, MD  [facility]. [place] office.

## 2021-09-18 ENCOUNTER — Other Ambulatory Visit: Payer: Self-pay | Admitting: Family Medicine

## 2021-09-18 DIAGNOSIS — E1169 Type 2 diabetes mellitus with other specified complication: Secondary | ICD-10-CM

## 2021-09-20 ENCOUNTER — Encounter: Payer: Self-pay | Admitting: Family Medicine

## 2021-09-20 ENCOUNTER — Telehealth (INDEPENDENT_AMBULATORY_CARE_PROVIDER_SITE_OTHER): Payer: Medicare Other | Admitting: Family Medicine

## 2021-09-20 VITALS — BP 136/60 | Ht 61.0 in

## 2021-09-20 DIAGNOSIS — E1169 Type 2 diabetes mellitus with other specified complication: Secondary | ICD-10-CM | POA: Diagnosis not present

## 2021-09-20 DIAGNOSIS — J449 Chronic obstructive pulmonary disease, unspecified: Secondary | ICD-10-CM | POA: Diagnosis not present

## 2021-09-20 DIAGNOSIS — N1831 Chronic kidney disease, stage 3a: Secondary | ICD-10-CM | POA: Diagnosis not present

## 2021-09-20 DIAGNOSIS — F419 Anxiety disorder, unspecified: Secondary | ICD-10-CM

## 2021-09-20 DIAGNOSIS — E782 Mixed hyperlipidemia: Secondary | ICD-10-CM | POA: Diagnosis not present

## 2021-09-20 DIAGNOSIS — E876 Hypokalemia: Secondary | ICD-10-CM | POA: Diagnosis not present

## 2021-09-20 MED ORDER — ESCITALOPRAM OXALATE 20 MG PO TABS: 20.0000 mg | ORAL_TABLET | Freq: Every day | ORAL | 1 refills | Status: DC

## 2021-09-20 MED ORDER — EZETIMIBE 10 MG PO TABS
10.0000 mg | ORAL_TABLET | Freq: Every day | ORAL | 0 refills | Status: DC
Start: 2021-09-20 — End: 2021-10-15

## 2021-09-20 NOTE — Assessment & Plan Note (Signed)
Reporting occasional episodes of anxiety and feeling "down." Lexapro increased from 15 mg daily to 20 mg daily. Recommend CBT, contact information of [facility].

## 2021-09-20 NOTE — Assessment & Plan Note (Signed)
HgA1C at goal. Continue same dose Metformin and glipizide.Some side effects discussed. Stressed the importance of avoiding skipping meals. Annual eye exam and foot care recommended. F/U in 5-6 months

## 2021-09-20 NOTE — Progress Notes (Signed)
Virtual Visit via Telephone Note I connected with Beverly Wheeler on 09/20/21 at 12:00 PM EST by telephone and verified that I am speaking with the correct person using two identifiers.   I discussed the limitations, risks, security and privacy concerns of performing an evaluation and management service by telephone and the availability of in person appointments. I also discussed with the patient that there may be a patient responsible charge related to this service. The patient expressed understanding and agreed to proceed.  Location patient: home Location provider: work office Participants present for the call: patient, provider Patient did not have a visit in the prior 7 days to address this/these issue(s).  Chief Complaint  Patient presents with   Follow-up   History of Present Illness: Beverly Wheeler is a 76 y.o.female with hx of DM 2, hyperlipidemia, COPD, CKD 3, venous stasis dermatitis of both lower extremities, and anxiety following on some of her chronic medical problems.  She was last seen on 08/25/2020, it was also virtual visit. She had labs recently. No new problems since her last visit.  DM II: Dx'ed  in 03/2022 with HgA1C of 9.0. Currently she is on metformin 500 mg twice daily and glipizide 5 mg daily. BS has been 120s. Occasionally 70s to 80s when she skips meals. Negative for abdominal pain, nausea, vomiting, polyuria, polydipsia, polyphagia.  Lab Results  Component Value Date   HGBA1C 6.4 09/17/2021   She is taking KLOR 20 meq daily. Last filled 12/2020 but she states that pharmacy was filling Rx before she was due, so she still has medication left. She is on Furosemide 40 mg daily for LE edema. Negative for severe/frequent headache, visual changes, chest pain, worsening dyspnea, palpitation,or focal weakness. CKD III: Negative for foamy urine, gross hematuria, or decreased urine output.  Lab Results  Component Value Date   CREATININE 1.39 (H) 09/17/2021    BUN 25 (H) 09/17/2021   NA 140 09/17/2021   K 4.3 09/17/2021   CL 92 (L) 09/17/2021   CO2 46 (H) 09/17/2021   She could not afford Lovaza. Currently she is on atorvastatin 80 mg daily and fenofibrate 54 mg daily. CAD, she has not followed with cardiologist in a while, 08/2014.  Lab Results  Component Value Date   CHOL 204 (H) 09/17/2021   HDL 31.20 (L) 09/17/2021   LDLCALC 133 (H) 09/17/2021   LDLDIRECT 145.0 08/10/2018   TRIG 200.0 (H) 09/17/2021   CHOLHDL 7 09/17/2021   Lab Results  Component Value Date   ALT 11 09/17/2021   AST 14 09/17/2021   ALKPHOS 63 09/17/2021   BILITOT 0.4 09/17/2021   COPD: Currently she is on Spiriva 18 mcg capsule daily and albuterol inhaler. She uses albuterol inhaler as needed, some months she does not need it, depends of weather changes and allergies. Last time used today for wheezing. Occasional cough, no SOB.  Anxiety: She is on Lexapro 10 mg 1/2 tablet daily. She has not taking BuSpar in a while, she was taking it as needed. Since her last visit her sister passed away, 23-Jun-2021.  States that now it is "just" she and her son, so sometimes she feels "down."  OA, she takes Tylenol. Requesting a handicap sticker.  Observations/Objective: Patient sounds cheerful and well on the phone. I do not appreciate any SOB. Speech and thought processing are grossly intact. Patient reported vitals:BP 136/60    Ht 5\' 1"  (1.549 m)    BMI 35.19 kg/m   Assessment and  Plan:  Diabetes mellitus ([place]) HgA1C at goal. Continue same dose Metformin and glipizide.Some side effects discussed. Stressed the importance of avoiding skipping meals. Annual eye exam and foot care recommended. F/U in 5-6 months   COPD  GOLD II based on fev1/VC of 58%  Stable. Continue Spiriva once daily and Albuterol ing 2 puff qid prn.  CKD (chronic kidney disease), stage III Cr and e GFR mildly worse when compared with labs done a year ago. Adequate hydration,low salt  diet,and avoidance of NSAID's to continue. Glucose and BP controlled.  Hypokalemia She is reporting taking K-Lor 20 mg daily, so no changes. We discussed some side effects of diuretic treatment.   Hyperlipidemia, mixed LDL is not at goal. Continue atorvastatin 80 mg daily and fenofibrate 54 mg daily. Zetia 10 mg daily added today. FLP in 3 months, if LDL is not at least < 70, we will consider a PCSK9 inh, Repatha.  Anxiety disorder, unspecified Reporting occasional episodes of anxiety and feeling "down." Lexapro increased from 15 mg daily to 20 mg daily. Recommend CBT, contact information of Faith.  Follow Up Instructions:  Return in about 4 months (around 01/18/2022).  I did not refer this patient for an OV in the next 24 hours for this/these issue(s).  I discussed the assessment and treatment plan with the patient. The patient was provided an opportunity to ask questions and all were answered. The patient agreed with the plan and demonstrated an understanding of the instructions.   The patient was advised to call back or seek an in-person evaluation if the symptoms worsen or if the condition fails to improve as anticipated.  I provided  27 minutes of non-face-to-face time during this encounter.  Sukhman Kocher G. [place], MD  [facility]. [place] office.

## 2021-09-20 NOTE — Assessment & Plan Note (Signed)
Cr and e GFR mildly worse when compared with labs done a year ago. Adequate hydration,low salt diet,and avoidance of NSAID's to continue. Glucose and BP controlled.

## 2021-09-20 NOTE — Assessment & Plan Note (Signed)
LDL is not at goal. Continue atorvastatin 80 mg daily and fenofibrate 54 mg daily. Zetia 10 mg daily added today. FLP in 3 months, if LDL is not at least < 70, we will consider a PCSK9 inh, Repatha.

## 2021-09-20 NOTE — Assessment & Plan Note (Signed)
She is reporting taking K-Lor 20 mg daily, so no changes. We discussed some side effects of diuretic treatment.

## 2021-09-20 NOTE — Assessment & Plan Note (Signed)
Stable. Continue Spiriva once daily and Albuterol ing 2 puff qid prn.

## 2021-09-22 LAB — FRUCTOSAMINE: Fructosamine: 216 umol/L (ref 205–285)

## 2021-10-01 ENCOUNTER — Telehealth: Payer: Medicare Other | Admitting: Family Medicine

## 2021-10-14 ENCOUNTER — Other Ambulatory Visit: Payer: Self-pay | Admitting: Family Medicine

## 2021-10-14 ENCOUNTER — Telehealth: Payer: Self-pay | Admitting: Family Medicine

## 2021-10-14 NOTE — Telephone Encounter (Signed)
Pt seen dr on 09-20-2021 and per pt she ask md to fill out handicap renewal form due to she is on oxygen and has trouble walking ?

## 2021-10-15 NOTE — Telephone Encounter (Signed)
It is Ok to renew handicap sticker. ?Thanks, ?BJ ?

## 2021-10-19 NOTE — Telephone Encounter (Signed)
Form filled out & on pcp's desk for signature. 

## 2021-10-19 NOTE — Telephone Encounter (Signed)
I called and spoke with patient. She will have her son come pick up the form, it has been placed up at the front. ?

## 2021-11-16 ENCOUNTER — Other Ambulatory Visit: Payer: Self-pay | Admitting: Family Medicine

## 2021-11-16 DIAGNOSIS — J449 Chronic obstructive pulmonary disease, unspecified: Secondary | ICD-10-CM

## 2021-12-04 ENCOUNTER — Other Ambulatory Visit: Payer: Self-pay | Admitting: Family Medicine

## 2021-12-22 ENCOUNTER — Telehealth: Payer: Self-pay | Admitting: Family Medicine

## 2021-12-22 NOTE — Telephone Encounter (Signed)
disregard

## 2021-12-24 ENCOUNTER — Ambulatory Visit: Payer: Medicare Other | Admitting: Family Medicine

## 2022-01-14 ENCOUNTER — Other Ambulatory Visit: Payer: Self-pay

## 2022-01-14 ENCOUNTER — Encounter (HOSPITAL_COMMUNITY): Payer: Self-pay

## 2022-01-14 ENCOUNTER — Inpatient Hospital Stay (HOSPITAL_COMMUNITY)
Admission: EM | Admit: 2022-01-14 | Discharge: 2022-01-16 | DRG: 190 | Disposition: A | Discharge status: Home Health | Payer: Medicare Other | Attending: Internal Medicine | Admitting: Internal Medicine

## 2022-01-14 ENCOUNTER — Emergency Department (HOSPITAL_COMMUNITY): Payer: Medicare Other

## 2022-01-14 DIAGNOSIS — E1169 Type 2 diabetes mellitus with other specified complication: Secondary | ICD-10-CM | POA: Diagnosis not present

## 2022-01-14 DIAGNOSIS — J9622 Acute and chronic respiratory failure with hypercapnia: Secondary | ICD-10-CM | POA: Diagnosis not present

## 2022-01-14 DIAGNOSIS — Z7984 Long term (current) use of oral hypoglycemic drugs: Secondary | ICD-10-CM | POA: Diagnosis not present

## 2022-01-14 DIAGNOSIS — J9621 Acute and chronic respiratory failure with hypoxia: Secondary | ICD-10-CM | POA: Diagnosis not present

## 2022-01-14 DIAGNOSIS — N1831 Chronic kidney disease, stage 3a: Secondary | ICD-10-CM

## 2022-01-14 DIAGNOSIS — I10 Essential (primary) hypertension: Secondary | ICD-10-CM | POA: Diagnosis not present

## 2022-01-14 DIAGNOSIS — R0689 Other abnormalities of breathing: Secondary | ICD-10-CM

## 2022-01-14 DIAGNOSIS — E1122 Type 2 diabetes mellitus with diabetic chronic kidney disease: Secondary | ICD-10-CM | POA: Diagnosis not present

## 2022-01-14 DIAGNOSIS — Z79899 Other long term (current) drug therapy: Secondary | ICD-10-CM | POA: Diagnosis not present

## 2022-01-14 DIAGNOSIS — E785 Hyperlipidemia, unspecified: Secondary | ICD-10-CM | POA: Diagnosis not present

## 2022-01-14 DIAGNOSIS — I499 Cardiac arrhythmia, unspecified: Secondary | ICD-10-CM | POA: Diagnosis not present

## 2022-01-14 DIAGNOSIS — J441 Chronic obstructive pulmonary disease with (acute) exacerbation: Secondary | ICD-10-CM | POA: Diagnosis not present

## 2022-01-14 DIAGNOSIS — Z87891 Personal history of nicotine dependence: Secondary | ICD-10-CM

## 2022-01-14 DIAGNOSIS — Z951 Presence of aortocoronary bypass graft: Secondary | ICD-10-CM | POA: Diagnosis not present

## 2022-01-14 DIAGNOSIS — I872 Venous insufficiency (chronic) (peripheral): Secondary | ICD-10-CM | POA: Diagnosis present

## 2022-01-14 DIAGNOSIS — Z7982 Long term (current) use of aspirin: Secondary | ICD-10-CM

## 2022-01-14 DIAGNOSIS — I252 Old myocardial infarction: Secondary | ICD-10-CM | POA: Diagnosis not present

## 2022-01-14 DIAGNOSIS — F32A Depression, unspecified: Secondary | ICD-10-CM | POA: Diagnosis present

## 2022-01-14 DIAGNOSIS — F419 Anxiety disorder, unspecified: Secondary | ICD-10-CM | POA: Diagnosis present

## 2022-01-14 DIAGNOSIS — Z7951 Long term (current) use of inhaled steroids: Secondary | ICD-10-CM

## 2022-01-14 DIAGNOSIS — F17200 Nicotine dependence, unspecified, uncomplicated: Secondary | ICD-10-CM | POA: Diagnosis present

## 2022-01-14 DIAGNOSIS — E7220 Disorder of urea cycle metabolism, unspecified: Secondary | ICD-10-CM | POA: Diagnosis present

## 2022-01-14 DIAGNOSIS — E860 Dehydration: Secondary | ICD-10-CM | POA: Diagnosis not present

## 2022-01-14 DIAGNOSIS — R8281 Pyuria: Secondary | ICD-10-CM | POA: Diagnosis present

## 2022-01-14 DIAGNOSIS — I2510 Atherosclerotic heart disease of native coronary artery without angina pectoris: Secondary | ICD-10-CM | POA: Diagnosis present

## 2022-01-14 DIAGNOSIS — G9341 Metabolic encephalopathy: Secondary | ICD-10-CM | POA: Diagnosis not present

## 2022-01-14 DIAGNOSIS — Z20822 Contact with and (suspected) exposure to covid-19: Secondary | ICD-10-CM | POA: Diagnosis present

## 2022-01-14 DIAGNOSIS — N390 Urinary tract infection, site not specified: Secondary | ICD-10-CM | POA: Diagnosis present

## 2022-01-14 DIAGNOSIS — J9611 Chronic respiratory failure with hypoxia: Secondary | ICD-10-CM | POA: Diagnosis present

## 2022-01-14 DIAGNOSIS — Z743 Need for continuous supervision: Secondary | ICD-10-CM | POA: Diagnosis not present

## 2022-01-14 DIAGNOSIS — R4182 Altered mental status, unspecified: Principal | ICD-10-CM

## 2022-01-14 DIAGNOSIS — R6889 Other general symptoms and signs: Secondary | ICD-10-CM | POA: Diagnosis not present

## 2022-01-14 DIAGNOSIS — Z8249 Family history of ischemic heart disease and other diseases of the circulatory system: Secondary | ICD-10-CM | POA: Diagnosis not present

## 2022-01-14 DIAGNOSIS — E119 Type 2 diabetes mellitus without complications: Secondary | ICD-10-CM

## 2022-01-14 DIAGNOSIS — Z818 Family history of other mental and behavioral disorders: Secondary | ICD-10-CM

## 2022-01-14 DIAGNOSIS — R404 Transient alteration of awareness: Secondary | ICD-10-CM | POA: Diagnosis not present

## 2022-01-14 DIAGNOSIS — I129 Hypertensive chronic kidney disease with stage 1 through stage 4 chronic kidney disease, or unspecified chronic kidney disease: Secondary | ICD-10-CM | POA: Diagnosis present

## 2022-01-14 DIAGNOSIS — J449 Chronic obstructive pulmonary disease, unspecified: Secondary | ICD-10-CM | POA: Diagnosis not present

## 2022-01-14 DIAGNOSIS — N183 Chronic kidney disease, stage 3 unspecified: Secondary | ICD-10-CM | POA: Diagnosis not present

## 2022-01-14 LAB — URINALYSIS, ROUTINE W REFLEX MICROSCOPIC
Bilirubin Urine: NEGATIVE
Glucose, UA: NEGATIVE mg/dL
Ketones, ur: NEGATIVE mg/dL
Nitrite: POSITIVE — AB
Protein, ur: 100 mg/dL — AB
Specific Gravity, Urine: 1.016 (ref 1.005–1.030)
WBC, UA: >50 WBC/hpf — ABNORMAL HIGH (ref 0–5)
pH: 5 (ref 5.0–8.0)

## 2022-01-14 LAB — BETA-HYDROXYBUTYRIC ACID: Beta-Hydroxybutyric Acid: 0.24 mmol/L (ref 0.05–0.27)

## 2022-01-14 LAB — TROPONIN I (HIGH SENSITIVITY)
Troponin I (High Sensitivity): 33 ng/L — ABNORMAL HIGH (ref <18)
Troponin I (High Sensitivity): 32 ng/L — ABNORMAL HIGH (ref <18)

## 2022-01-14 LAB — I-STAT CHEM 8, ED
BUN: 17 mg/dL (ref 8–23)
Calcium, Ion: 1.09 mmol/L — ABNORMAL LOW (ref 1.15–1.40)
Chloride: 90 mmol/L — ABNORMAL LOW (ref 98–111)
Creatinine, Ser: 1 mg/dL (ref 0.44–1.00)
Glucose, Bld: 144 mg/dL — ABNORMAL HIGH (ref 70–99)
HCT: 33 % — ABNORMAL LOW (ref 36.0–46.0)
Hemoglobin: 11.2 g/dL — ABNORMAL LOW (ref 12.0–15.0)
Potassium: 4.9 mmol/L (ref 3.5–5.1)
Sodium: 139 mmol/L (ref 135–145)
TCO2: 46 mmol/L — ABNORMAL HIGH (ref 22–32)

## 2022-01-14 LAB — CBC WITH DIFFERENTIAL/PLATELET
Abs Immature Granulocytes: 0.09 10*3/uL — ABNORMAL HIGH (ref 0.00–0.07)
Basophils Absolute: 0.1 10*3/uL (ref 0.0–0.1)
Basophils Relative: 1 %
Eosinophils Absolute: 0.3 10*3/uL (ref 0.0–0.5)
Eosinophils Relative: 3 %
HCT: 33.4 % — ABNORMAL LOW (ref 36.0–46.0)
Hemoglobin: 10.7 g/dL — ABNORMAL LOW (ref 12.0–15.0)
Immature Granulocytes: 1 %
Lymphocytes Relative: 15 %
Lymphs Abs: 1.4 10*3/uL (ref 0.7–4.0)
MCH: 32.6 pg (ref 26.0–34.0)
MCHC: 32 g/dL (ref 30.0–36.0)
MCV: 101.8 fL — ABNORMAL HIGH (ref 80.0–100.0)
Monocytes Absolute: 0.5 10*3/uL (ref 0.1–1.0)
Monocytes Relative: 5 %
Neutro Abs: 7.1 10*3/uL (ref 1.7–7.7)
Neutrophils Relative %: 75 %
Platelets: 191 10*3/uL (ref 150–400)
RBC: 3.28 MIL/uL — ABNORMAL LOW (ref 3.87–5.11)
RDW: 12.7 % (ref 11.5–15.5)
WBC: 9.3 10*3/uL (ref 4.0–10.5)
nRBC: 0 % (ref 0.0–0.2)

## 2022-01-14 LAB — I-STAT VENOUS BLOOD GAS, ED
Acid-Base Excess: 20 mmol/L — ABNORMAL HIGH (ref 0.0–2.0)
Bicarbonate: 50.7 mmol/L — ABNORMAL HIGH (ref 20.0–28.0)
Calcium, Ion: 1.17 mmol/L (ref 1.15–1.40)
HCT: 32 % — ABNORMAL LOW (ref 36.0–46.0)
Hemoglobin: 10.9 g/dL — ABNORMAL LOW (ref 12.0–15.0)
O2 Saturation: 78 %
Potassium: 4.8 mmol/L (ref 3.5–5.1)
Sodium: 139 mmol/L (ref 135–145)
TCO2: >50 mmol/L — ABNORMAL HIGH (ref 22–32)
pCO2, Ven: 103.9 mmHg (ref 44–60)
pH, Ven: 7.296 (ref 7.25–7.43)
pO2, Ven: 52 mmHg — ABNORMAL HIGH (ref 32–45)

## 2022-01-14 LAB — AMMONIA: Ammonia: 76 umol/L — ABNORMAL HIGH (ref 9–35)

## 2022-01-14 LAB — COMPREHENSIVE METABOLIC PANEL
ALT: 15 U/L (ref 0–44)
AST: 29 U/L (ref 15–41)
Albumin: 3.4 g/dL — ABNORMAL LOW (ref 3.5–5.0)
Alkaline Phosphatase: 59 U/L (ref 38–126)
Anion gap: 4 — ABNORMAL LOW (ref 5–15)
BUN: 12 mg/dL (ref 8–23)
CO2: 45 mmol/L — ABNORMAL HIGH (ref 22–32)
Calcium: 8.9 mg/dL (ref 8.9–10.3)
Chloride: 92 mmol/L — ABNORMAL LOW (ref 98–111)
Creatinine, Ser: 1.01 mg/dL — ABNORMAL HIGH (ref 0.44–1.00)
GFR, Estimated: 59 mL/min — ABNORMAL LOW (ref >60)
Glucose, Bld: 145 mg/dL — ABNORMAL HIGH (ref 70–99)
Potassium: 4.9 mmol/L (ref 3.5–5.1)
Sodium: 141 mmol/L (ref 135–145)
Total Bilirubin: 0.5 mg/dL (ref 0.3–1.2)
Total Protein: 6.6 g/dL (ref 6.5–8.1)

## 2022-01-14 LAB — LACTIC ACID, PLASMA
Lactic Acid, Venous: 0.7 mmol/L (ref 0.5–1.9)
Lactic Acid, Venous: 1.1 mmol/L (ref 0.5–1.9)

## 2022-01-14 LAB — CBG MONITORING, ED: Glucose-Capillary: 140 mg/dL — ABNORMAL HIGH (ref 70–99)

## 2022-01-14 MED ORDER — SODIUM CHLORIDE 0.9 % IV SOLN
1.0000 g | INTRAVENOUS | Status: DC
  Administered 2022-01-15: 1 g via INTRAVENOUS
  Filled 2022-01-14: qty 10

## 2022-01-14 MED ORDER — UMECLIDINIUM BROMIDE 62.5 MCG/ACT IN AEPB
1.0000 | INHALATION_SPRAY | Freq: Every day | RESPIRATORY_TRACT | Status: DC
  Administered 2022-01-16: 1 via RESPIRATORY_TRACT
  Filled 2022-01-14: qty 7

## 2022-01-14 MED ORDER — SODIUM CHLORIDE 0.9 % IV SOLN
1.0000 g | Freq: Once | INTRAVENOUS | Status: AC
  Administered 2022-01-14: 1 g via INTRAVENOUS
  Filled 2022-01-14: qty 10

## 2022-01-14 MED ORDER — METHYLPREDNISOLONE SODIUM SUCC 40 MG IJ SOLR
40.0000 mg | Freq: Two times a day (BID) | INTRAMUSCULAR | Status: DC
Start: 2022-01-14 — End: 2022-01-16
  Administered 2022-01-14 – 2022-01-16 (×4): 40 mg via INTRAVENOUS
  Filled 2022-01-14 (×4): qty 1

## 2022-01-14 MED ORDER — SODIUM CHLORIDE 0.9 % IV SOLN: 1.0000 g | INTRAVENOUS | Status: DC

## 2022-01-14 MED ORDER — LACTATED RINGERS IV BOLUS
500.0000 mL | Freq: Once | INTRAVENOUS | Status: AC
  Administered 2022-01-14: 500 mL via INTRAVENOUS

## 2022-01-14 MED ORDER — IPRATROPIUM-ALBUTEROL 0.5-2.5 (3) MG/3ML IN SOLN
3.0000 mL | Freq: Once | RESPIRATORY_TRACT | Status: AC
  Administered 2022-01-14: 3 mL via RESPIRATORY_TRACT
  Filled 2022-01-14: qty 3

## 2022-01-14 MED ORDER — UMECLIDINIUM BROMIDE 62.5 MCG/ACT IN AEPB: 1.0000 | INHALATION_SPRAY | Freq: Every day | RESPIRATORY_TRACT | Status: DC

## 2022-01-14 MED ORDER — MOMETASONE FURO-FORMOTEROL FUM 200-5 MCG/ACT IN AERO
2.0000 | INHALATION_SPRAY | Freq: Two times a day (BID) | RESPIRATORY_TRACT | Status: DC
  Administered 2022-01-15 – 2022-01-16 (×2): 2 via RESPIRATORY_TRACT
  Filled 2022-01-14: qty 8.8

## 2022-01-14 MED ORDER — INSULIN ASPART 100 UNIT/ML IJ SOLN
0.0000 [IU] | INTRAMUSCULAR | Status: DC
  Administered 2022-01-14: 1 [IU] via SUBCUTANEOUS
  Administered 2022-01-15: 2 [IU] via SUBCUTANEOUS
  Administered 2022-01-15: 1 [IU] via SUBCUTANEOUS
  Administered 2022-01-15 (×2): 2 [IU] via SUBCUTANEOUS
  Administered 2022-01-15 – 2022-01-16 (×2): 1 [IU] via SUBCUTANEOUS
  Administered 2022-01-16: 2 [IU] via SUBCUTANEOUS

## 2022-01-14 MED ORDER — ENOXAPARIN SODIUM 40 MG/0.4ML IJ SOSY
40.0000 mg | PREFILLED_SYRINGE | INTRAMUSCULAR | Status: DC
Start: 2022-01-14 — End: 2022-01-16
  Administered 2022-01-14 – 2022-01-15 (×2): 40 mg via SUBCUTANEOUS
  Filled 2022-01-14 (×2): qty 0.4

## 2022-01-14 MED ORDER — ALBUTEROL SULFATE (2.5 MG/3ML) 0.083% IN NEBU: 2.5000 mg | INHALATION_SOLUTION | RESPIRATORY_TRACT | Status: DC | PRN

## 2022-01-14 MED ORDER — LACTATED RINGERS IV BOLUS
1000.0000 mL | Freq: Once | INTRAVENOUS | Status: AC
  Administered 2022-01-14: 1000 mL via INTRAVENOUS

## 2022-01-14 NOTE — Assessment & Plan Note (Signed)
Listed on her chart, but creat only 1.0 today with BUN 17.

## 2022-01-14 NOTE — ED Provider Notes (Signed)
[facility] EMERGENCY DEPARTMENT Provider Note   CSN: [id number] Arrival date & time: 01/14/22  1349     History  Chief Complaint  Patient presents with   Altered Mental Status    Beverly Wheeler is a 76 y.o. female.  HPI      76 year old female with a history of diabetes type 2, hyperlipidemia, COPD on 2-3L O2 at home,  CKD stage III, venous stasis dermatitis of bilateral lower extremities, anxiety, CHF who presents with concern for generalized weakness.  Feeling weak and thirsty, dehydrated For a few days No nausea or vomitiing No diarrhea, has had loose stools No black or bloody stools No fevers No cough, shortness of breath, chest pain No headache  Feels like how she felt in 2019 when she was admittted for acute renal failure    Taking medications as prescribed  No falls  Denies numbness, weakness, difficulty talking or walking, visual changes or facial droop.    Took a blood pressure pill   Past Medical History:  Diagnosis Date   Allergy    Asthma    CAD (coronary artery disease)    COPD (chronic obstructive pulmonary disease) (HCC)    Depression    GERD (gastroesophageal reflux disease)    Hyperlipidemia    Hypertension    Myocardial infarction (HCC) 10/16/2013    Home Medications Prior to Admission medications   Medication Sig Start Date End Date Taking? Authorizing Provider  albuterol (VENTOLIN HFA) 108 (90 Base) MCG/ACT inhaler INHALE 1 PUFF EVERY 4 HOURS AS NEEDED FOR WHEEZE OR FOR SHORTNESS OF BREATH Patient taking differently: Inhale 1 puff into the lungs every 4 (four) hours as needed for wheezing or shortness of breath. 11/16/21  Yes [place], Betty G, MD  aspirin EC 81 MG EC tablet Take 1 tablet (81 mg total) by mouth daily. 10/21/13  Yes Barrett, Joline Salt, PA-C  cetirizine (ZYRTEC) 10 MG tablet Take 10 mg by mouth daily.   Yes [provider]  escitalopram (LEXAPRO) 20 MG tablet Take 1 tablet (20 mg total) by  mouth daily. 09/20/21  Yes [place], Betty G, MD  ezetimibe (ZETIA) 10 MG tablet TAKE 1 TABLET BY MOUTH EVERY DAY Patient taking differently: Take 10 mg by mouth daily. 10/15/21  Yes [place], Betty G, MD  fenofibrate 54 MG tablet TAKE 1 TABLET BY MOUTH EVERY DAY Patient taking differently: Take 54 mg by mouth daily. 08/16/21  Yes [place], Betty G, MD  folic acid (FOLVITE) 1 MG tablet TAKE 1 TABLET BY MOUTH EVERY DAY Patient taking differently: Take 1 mg by mouth daily. 11/16/20  Yes [place], Betty G, MD  furosemide (LASIX) 40 MG tablet TAKE 1 TABLET BY MOUTH EVERY DAY Patient taking differently: Take 40 mg by mouth daily. 08/16/21  Yes [place], Betty G, MD  glipiZIDE (GLUCOTROL) 5 MG tablet TAKE 1 TABLET BY MOUTH DAILY BEFORE BREAKFAST. 20-30 MINUTES BEFORE MEAL. Patient taking differently: Take 5 mg by mouth daily before breakfast. 09/20/21  Yes [place], Betty G, MD  metFORMIN (GLUCOPHAGE) 500 MG tablet TAKE 1 TABLET BY MOUTH 2 TIMES DAILY WITH A MEAL. Patient taking differently: Take 500 mg by mouth 2 (two) times daily with a meal. 08/16/21  Yes [place], Betty G, MD  montelukast (SINGULAIR) 10 MG tablet Take 1 tablet (10 mg total) by mouth at bedtime. 03/25/20  Yes [place], Betty G, MD  nebivolol (BYSTOLIC) 5 MG tablet TAKE 1 TABLET BY MOUTH EVERY DAY Patient taking differently: Take 5 mg by  mouth daily. 10/15/21  Yes [place], Betty G, MD  nitroGLYCERIN (NITROSTAT) 0.4 MG SL tablet Place 1 tablet (0.4 mg total) under the tongue every 5 (five) minutes as needed for chest pain. 06/10/15  Yes Croitoru, Mihai, MD  ranitidine (ZANTAC) 150 MG tablet Take 150 mg by mouth 2 (two) times daily.   Yes [provider]  tiotropium (SPIRIVA HANDIHALER) 18 MCG inhalation capsule INHALE 1 CAPSULE VIA HANDIHALER ONCE DAILY AT THE SAME TIME EVERY DAY Patient taking differently: Place 18 mcg into inhaler and inhale daily. 08/25/20  Yes [place], Betty G, MD  triamcinolone (NASACORT ALLERGY 24HR) 55 MCG/ACT AERO nasal inhaler  Place 2 sprays into the nose daily.   Yes [provider]  vitamin B-12 1000 MCG tablet Take 1 tablet (1,000 mcg total) by mouth daily. 12/06/17  Yes Leroy Sea, MD  ACCU-CHEK GUIDE test strip USE TO CHECK BLOOD SUGARS 4 TIMES DAILY. DX:E11.9 12/06/21   [place], Betty G, MD  Accu-Chek Softclix Lancets lancets Use to check blood sugars 1-2 times daily. Dx:e11.9 05/04/20   [place], Betty G, MD  atorvastatin (LIPITOR) 80 MG tablet TAKE 1 TABLET BY MOUTH EVERY DAY AT 6PM Patient not taking: Reported on 01/14/2022 07/30/21   [place], Betty G, MD  Blood Glucose Monitoring Suppl (ACCU-CHEK AVIVA PLUS) w/Device KIT Use to test blood sugars 1-2 times daily. Dx: E11.9 05/04/20   [place], Betty G, MD  ferrous gluconate (FERGON) 324 MG tablet TAKE 1 TABLET BY MOUTH TWICE A DAY WITH MEALS Patient not taking: Reported on 01/14/2022 08/09/19   [place], Betty G, MD  KLOR-CON M20 20 MEQ tablet TAKE 1 TABLET BY MOUTH EVERY DAY Patient not taking: Reported on 01/14/2022 12/25/20   [place], Betty G, MD      Allergies    Patient has no known allergies.    Review of Systems   Review of Systems  Physical Exam Updated Vital Signs BP (!) 146/55 (BP Location: Right Arm)   Pulse 65   Temp 98.7 F (37.1 C) (Axillary)   Resp (!) 26   Wt 82.7 kg   SpO2 95%   BMI 34.45 kg/m  Physical Exam Vitals and nursing note reviewed.  Constitutional:      General: She is not in acute distress.    Appearance: She is well-developed. She is ill-appearing. She is not diaphoretic.  HENT:     Head: Normocephalic and atraumatic.  Eyes:     Conjunctiva/sclera: Conjunctivae normal.  Cardiovascular:     Rate and Rhythm: Normal rate and regular rhythm.     Heart sounds: Normal heart sounds. No murmur heard.    No friction rub. No gallop.  Pulmonary:     Effort: Pulmonary effort is normal. No respiratory distress.     Breath sounds: Normal breath sounds. No wheezing or rales.  Abdominal:     General: There is no distension.      Palpations: Abdomen is soft.     Tenderness: There is no abdominal tenderness. There is no guarding.  Musculoskeletal:        General: No tenderness.     Cervical back: Normal range of motion.  Skin:    General: Skin is warm and dry.     Findings: No erythema or rash.  Neurological:     Mental Status: She is alert and oriented to person, place, and time.     ED Results / Procedures / Treatments   Labs (all labs ordered are listed, but only abnormal results  are displayed) Labs Reviewed  CBC WITH DIFFERENTIAL/PLATELET - Abnormal; Notable for the following components:      Result Value   RBC 3.28 (*)    Hemoglobin 10.7 (*)    HCT 33.4 (*)    MCV 101.8 (*)    Abs Immature Granulocytes 0.09 (*)    All other components within normal limits  COMPREHENSIVE METABOLIC PANEL - Abnormal; Notable for the following components:   Chloride 92 (*)    CO2 45 (*)    Glucose, Bld 145 (*)    Creatinine, Ser 1.01 (*)    Albumin 3.4 (*)    GFR, Estimated 59 (*)    Anion gap 4 (*)    All other components within normal limits  AMMONIA - Abnormal; Notable for the following components:   Ammonia 76 (*)    All other components within normal limits  URINALYSIS, ROUTINE W REFLEX MICROSCOPIC - Abnormal; Notable for the following components:   APPearance CLOUDY (*)    Hgb urine dipstick SMALL (*)    Protein, ur 100 (*)    Nitrite POSITIVE (*)    Leukocytes,Ua LARGE (*)    WBC, UA >50 (*)    Bacteria, UA FEW (*)    All other components within normal limits  I-STAT VENOUS BLOOD GAS, ED - Abnormal; Notable for the following components:   pCO2, Ven 103.9 (*)    pO2, Ven 52 (*)    Bicarbonate 50.7 (*)    TCO2 >50 (*)    Acid-Base Excess 20.0 (*)    HCT 32.0 (*)    Hemoglobin 10.9 (*)    All other components within normal limits  I-STAT CHEM 8, ED - Abnormal; Notable for the following components:   Chloride 90 (*)    Glucose, Bld 144 (*)    Calcium, Ion 1.09 (*)    TCO2 46 (*)     Hemoglobin 11.2 (*)    HCT 33.0 (*)    All other components within normal limits  CBG MONITORING, ED - Abnormal; Notable for the following components:   Glucose-Capillary 140 (*)    All other components within normal limits  TROPONIN I (HIGH SENSITIVITY) - Abnormal; Notable for the following components:   Troponin I (High Sensitivity) 33 (*)    All other components within normal limits  TROPONIN I (HIGH SENSITIVITY) - Abnormal; Notable for the following components:   Troponin I (High Sensitivity) 32 (*)    All other components within normal limits  URINE CULTURE  LACTIC ACID, PLASMA  LACTIC ACID, PLASMA  BETA-HYDROXYBUTYRIC ACID  HIV ANTIBODY (ROUTINE TESTING W REFLEX)  CBC  BASIC METABOLIC PANEL  AMMONIA    EKG EKG Interpretation  Date/Time:  Friday January 14 2022 14:18:03 EDT Ventricular Rate:  69 PR Interval:    QRS Duration: 116 QT Interval:  400 QTC Calculation: 435 R Axis:   51 Text Interpretation: unclear rhythm from artifact, suspect sinus Low voltage, extremity leads Lateral infarct, age indeterminate Confirmed by Marianna Fuss ([id number]) on 01/14/2022 3:28:38 PM  Radiology CT Head Wo Contrast  Result Date: 01/14/2022 CLINICAL DATA:  Altered mental status EXAM: CT HEAD WITHOUT CONTRAST TECHNIQUE: Contiguous axial images were obtained from the base of the skull through the vertex without intravenous contrast. RADIATION DOSE REDUCTION: This exam was performed according to the departmental dose-optimization program which includes automated exposure control, adjustment of the mA and/or kV according to patient size and/or use of iterative reconstruction technique. COMPARISON:  None Available. FINDINGS: Brain: Ventricle size  and cerebral volume normal. Hypodensity left occipital lobe with volume loss compatible with chronic ischemia. Mild chronic ischemia in the white matter Negative for acute infarct, hemorrhage, mass Vascular: Negative for hyperdense vessel Skull: Negative  Sinuses/Orbits: Paranasal sinuses clear.  Negative orbit Other: None IMPRESSION: No acute abnormality Chronic infarct left occipital lobe. Chronic microvascular ischemia in the white matter. Electronically Signed   By: Marlan Palau M.D.   On: 01/14/2022 16:28   DG Chest Portable 1 View  Result Date: 01/14/2022 CLINICAL DATA:  Altered mental status EXAM: PORTABLE CHEST 1 VIEW COMPARISON:  08/10/2018 FINDINGS: Cardiac enlargement.  Mild vascular congestion.  Negative for edema. Mild patchy bibasilar airspace disease left greater than right likely atelectasis. No effusion. IMPRESSION: Mild vascular congestion without edema Mild bibasilar atelectasis. Electronically Signed   By: Marlan Palau M.D.   On: 01/14/2022 15:39    Procedures Procedures    Medications Ordered in ED Medications  enoxaparin (LOVENOX) injection 40 mg (40 mg Subcutaneous Given 01/14/22 2046)  methylPREDNISolone sodium succinate (SOLU-MEDROL) 40 mg/mL injection 40 mg (40 mg Intravenous Given 01/14/22 2045)  mometasone-formoterol (DULERA) 200-5 MCG/ACT inhaler 2 puff (2 puffs Inhalation Not Given 01/14/22 2223)  albuterol (PROVENTIL) (2.5 MG/3ML) 0.083% nebulizer solution 2.5 mg (has no administration in time range)  cefTRIAXone (ROCEPHIN) 1 g in sodium chloride 0.9 % 100 mL IVPB (has no administration in time range)  insulin aspart (novoLOG) injection 0-9 Units (1 Units Subcutaneous Given 01/14/22 2051)  umeclidinium bromide (INCRUSE ELLIPTA) 62.5 MCG/ACT 1 puff (has no administration in time range)  lactated ringers bolus 1,000 mL (0 mLs Intravenous Stopped 01/14/22 1751)  lactated ringers bolus 500 mL (0 mLs Intravenous Stopped 01/14/22 1815)  ipratropium-albuterol (DUONEB) 0.5-2.5 (3) MG/3ML nebulizer solution 3 mL (3 mLs Nebulization Given 01/14/22 1927)  cefTRIAXone (ROCEPHIN) 1 g in sodium chloride 0.9 % 100 mL IVPB (0 g Intravenous Stopped 01/14/22 2006)    ED Course/ Medical Decision Making/ A&P                            Medical Decision Making Amount and/or Complexity of Data Reviewed Labs: ordered. Radiology: ordered.  Risk Prescription drug management. Decision regarding hospitalization.    76 year old female with a history of diabetes type 2, hyperlipidemia, COPD on 2-3L O2 at home,  CKD stage III, venous stasis dermatitis of bilateral lower extremities, anxiety, CHF who presents with concern for generalized weakness.  DDx large and includes hyperglycemia/HHS/DKA, other electrolyte abnormalities/renal failure, acute infection, other toxic/metabolic syndrome, hepatic encephalopathy, ICH, infection.  Ordered labs including VBG, istat chem 8, CBC, CMP, ammonia and head CT> Signed out to Dr. Stevie Kern with plan pending.          Final Clinical Impression(s) / ED Diagnoses Final diagnoses:  Altered mental status, unspecified altered mental status type  Urinary tract infection without hematuria, site unspecified  Hypercarbia    Rx / DC Orders ED Discharge Orders     None         Alvira Monday, MD 01/14/22 2309

## 2022-01-14 NOTE — ED Triage Notes (Signed)
Pt BIB [facility] afterv son called reporting pt decline in mental status. PT answers orientation questions appropriately during triage. Pt reports, " I feel like I'm dehydrated."

## 2022-01-15 ENCOUNTER — Other Ambulatory Visit: Payer: Self-pay | Admitting: Family Medicine

## 2022-01-15 DIAGNOSIS — Z79899 Other long term (current) drug therapy: Secondary | ICD-10-CM | POA: Diagnosis not present

## 2022-01-15 DIAGNOSIS — Z8249 Family history of ischemic heart disease and other diseases of the circulatory system: Secondary | ICD-10-CM | POA: Diagnosis not present

## 2022-01-15 DIAGNOSIS — E860 Dehydration: Secondary | ICD-10-CM | POA: Diagnosis present

## 2022-01-15 DIAGNOSIS — G9341 Metabolic encephalopathy: Secondary | ICD-10-CM | POA: Diagnosis present

## 2022-01-15 DIAGNOSIS — Z7984 Long term (current) use of oral hypoglycemic drugs: Secondary | ICD-10-CM | POA: Diagnosis not present

## 2022-01-15 DIAGNOSIS — Z7951 Long term (current) use of inhaled steroids: Secondary | ICD-10-CM | POA: Diagnosis not present

## 2022-01-15 DIAGNOSIS — I252 Old myocardial infarction: Secondary | ICD-10-CM | POA: Diagnosis not present

## 2022-01-15 DIAGNOSIS — E1122 Type 2 diabetes mellitus with diabetic chronic kidney disease: Secondary | ICD-10-CM | POA: Diagnosis present

## 2022-01-15 DIAGNOSIS — E7220 Disorder of urea cycle metabolism, unspecified: Secondary | ICD-10-CM | POA: Diagnosis present

## 2022-01-15 DIAGNOSIS — I2510 Atherosclerotic heart disease of native coronary artery without angina pectoris: Secondary | ICD-10-CM | POA: Diagnosis present

## 2022-01-15 DIAGNOSIS — J9622 Acute and chronic respiratory failure with hypercapnia: Secondary | ICD-10-CM | POA: Diagnosis present

## 2022-01-15 DIAGNOSIS — N390 Urinary tract infection, site not specified: Secondary | ICD-10-CM | POA: Diagnosis present

## 2022-01-15 DIAGNOSIS — Z7982 Long term (current) use of aspirin: Secondary | ICD-10-CM | POA: Diagnosis not present

## 2022-01-15 DIAGNOSIS — F419 Anxiety disorder, unspecified: Secondary | ICD-10-CM | POA: Diagnosis present

## 2022-01-15 DIAGNOSIS — J441 Chronic obstructive pulmonary disease with (acute) exacerbation: Secondary | ICD-10-CM | POA: Diagnosis present

## 2022-01-15 DIAGNOSIS — J9621 Acute and chronic respiratory failure with hypoxia: Secondary | ICD-10-CM | POA: Diagnosis not present

## 2022-01-15 DIAGNOSIS — I872 Venous insufficiency (chronic) (peripheral): Secondary | ICD-10-CM | POA: Diagnosis present

## 2022-01-15 DIAGNOSIS — E785 Hyperlipidemia, unspecified: Secondary | ICD-10-CM | POA: Diagnosis present

## 2022-01-15 DIAGNOSIS — F32A Depression, unspecified: Secondary | ICD-10-CM | POA: Diagnosis present

## 2022-01-15 DIAGNOSIS — I129 Hypertensive chronic kidney disease with stage 1 through stage 4 chronic kidney disease, or unspecified chronic kidney disease: Secondary | ICD-10-CM | POA: Diagnosis present

## 2022-01-15 DIAGNOSIS — Z951 Presence of aortocoronary bypass graft: Secondary | ICD-10-CM | POA: Diagnosis not present

## 2022-01-15 DIAGNOSIS — Z818 Family history of other mental and behavioral disorders: Secondary | ICD-10-CM | POA: Diagnosis not present

## 2022-01-15 DIAGNOSIS — N183 Chronic kidney disease, stage 3 unspecified: Secondary | ICD-10-CM | POA: Diagnosis present

## 2022-01-15 DIAGNOSIS — Z87891 Personal history of nicotine dependence: Secondary | ICD-10-CM | POA: Diagnosis not present

## 2022-01-15 DIAGNOSIS — Z20822 Contact with and (suspected) exposure to covid-19: Secondary | ICD-10-CM | POA: Diagnosis present

## 2022-01-15 DIAGNOSIS — R0689 Other abnormalities of breathing: Secondary | ICD-10-CM | POA: Diagnosis present

## 2022-01-15 LAB — BLOOD GAS, VENOUS
Acid-Base Excess: 18.4 mmol/L — ABNORMAL HIGH (ref 0.0–2.0)
Bicarbonate: 49.1 mmol/L — ABNORMAL HIGH (ref 20.0–28.0)
Drawn by: 56089
O2 Saturation: 91.8 %
Patient temperature: 37
pCO2, Ven: 91 mmHg (ref 44–60)
pH, Ven: 7.34 (ref 7.25–7.43)
pO2, Ven: 55 mmHg — ABNORMAL HIGH (ref 32–45)

## 2022-01-15 LAB — CBC
HCT: 32.3 % — ABNORMAL LOW (ref 36.0–46.0)
Hemoglobin: 10.2 g/dL — ABNORMAL LOW (ref 12.0–15.0)
MCH: 31.7 pg (ref 26.0–34.0)
MCHC: 31.6 g/dL (ref 30.0–36.0)
MCV: 100.3 fL — ABNORMAL HIGH (ref 80.0–100.0)
Platelets: 163 10*3/uL (ref 150–400)
RBC: 3.22 MIL/uL — ABNORMAL LOW (ref 3.87–5.11)
RDW: 12.4 % (ref 11.5–15.5)
WBC: 8.2 10*3/uL (ref 4.0–10.5)
nRBC: 0 % (ref 0.0–0.2)

## 2022-01-15 LAB — BASIC METABOLIC PANEL
Anion gap: 8 (ref 5–15)
BUN: 11 mg/dL (ref 8–23)
CO2: 40 mmol/L — ABNORMAL HIGH (ref 22–32)
Calcium: 8.9 mg/dL (ref 8.9–10.3)
Chloride: 91 mmol/L — ABNORMAL LOW (ref 98–111)
Creatinine, Ser: 1.01 mg/dL — ABNORMAL HIGH (ref 0.44–1.00)
GFR, Estimated: 59 mL/min — ABNORMAL LOW (ref >60)
Glucose, Bld: 152 mg/dL — ABNORMAL HIGH (ref 70–99)
Potassium: 4.7 mmol/L (ref 3.5–5.1)
Sodium: 139 mmol/L (ref 135–145)

## 2022-01-15 LAB — GLUCOSE, CAPILLARY
Glucose-Capillary: 132 mg/dL — ABNORMAL HIGH (ref 70–99)
Glucose-Capillary: 143 mg/dL — ABNORMAL HIGH (ref 70–99)
Glucose-Capillary: 144 mg/dL — ABNORMAL HIGH (ref 70–99)
Glucose-Capillary: 158 mg/dL — ABNORMAL HIGH (ref 70–99)
Glucose-Capillary: 167 mg/dL — ABNORMAL HIGH (ref 70–99)
Glucose-Capillary: 173 mg/dL — ABNORMAL HIGH (ref 70–99)
Glucose-Capillary: 184 mg/dL — ABNORMAL HIGH (ref 70–99)

## 2022-01-15 LAB — AMMONIA: Ammonia: 23 umol/L (ref 9–35)

## 2022-01-15 LAB — HIV ANTIBODY (ROUTINE TESTING W REFLEX): HIV Screen 4th Generation wRfx: NONREACTIVE

## 2022-01-15 MED ORDER — SODIUM CHLORIDE 0.9 % IV SOLN: INTRAVENOUS | Status: DC | PRN

## 2022-01-16 DIAGNOSIS — J9622 Acute and chronic respiratory failure with hypercapnia: Secondary | ICD-10-CM | POA: Diagnosis not present

## 2022-01-16 DIAGNOSIS — J9621 Acute and chronic respiratory failure with hypoxia: Secondary | ICD-10-CM | POA: Diagnosis not present

## 2022-01-16 LAB — CBC
HCT: 30.8 % — ABNORMAL LOW (ref 36.0–46.0)
Hemoglobin: 10.2 g/dL — ABNORMAL LOW (ref 12.0–15.0)
MCH: 32.1 pg (ref 26.0–34.0)
MCHC: 33.1 g/dL (ref 30.0–36.0)
MCV: 96.9 fL (ref 80.0–100.0)
Platelets: 184 10*3/uL (ref 150–400)
RBC: 3.18 MIL/uL — ABNORMAL LOW (ref 3.87–5.11)
RDW: 12.1 % (ref 11.5–15.5)
WBC: 7.8 10*3/uL (ref 4.0–10.5)
nRBC: 0 % (ref 0.0–0.2)

## 2022-01-16 LAB — URINE CULTURE: Culture: >=100000 — AB

## 2022-01-16 LAB — GLUCOSE, CAPILLARY
Glucose-Capillary: 149 mg/dL — ABNORMAL HIGH (ref 70–99)
Glucose-Capillary: 138 mg/dL — ABNORMAL HIGH (ref 70–99)
Glucose-Capillary: 170 mg/dL — ABNORMAL HIGH (ref 70–99)

## 2022-01-16 LAB — BASIC METABOLIC PANEL
Anion gap: 10 (ref 5–15)
BUN: 19 mg/dL (ref 8–23)
CO2: 39 mmol/L — ABNORMAL HIGH (ref 22–32)
Calcium: 9.2 mg/dL (ref 8.9–10.3)
Chloride: 91 mmol/L — ABNORMAL LOW (ref 98–111)
Creatinine, Ser: 1.01 mg/dL — ABNORMAL HIGH (ref 0.44–1.00)
GFR, Estimated: 59 mL/min — ABNORMAL LOW (ref >60)
Glucose, Bld: 147 mg/dL — ABNORMAL HIGH (ref 70–99)
Potassium: 4.5 mmol/L (ref 3.5–5.1)
Sodium: 140 mmol/L (ref 135–145)

## 2022-01-16 MED ORDER — AMOXICILLIN-POT CLAVULANATE 875-125 MG PO TABS: 1.0000 | ORAL_TABLET | Freq: Two times a day (BID) | ORAL | 0 refills | Status: AC

## 2022-01-16 MED ORDER — PREDNISONE 20 MG PO TABS
40.0000 mg | ORAL_TABLET | Freq: Every day | ORAL | 0 refills | Status: AC
Start: 2022-01-16 — End: 2022-01-19

## 2022-01-16 NOTE — Progress Notes (Signed)
Pt refused BiPaP at this time. Pt informed to call if she changed her mind.

## 2022-01-17 ENCOUNTER — Telehealth: Payer: Self-pay

## 2022-01-17 LAB — HEMOGLOBIN A1C
Hgb A1c MFr Bld: 6.1 % — ABNORMAL HIGH (ref 4.8–5.6)
Mean Plasma Glucose: 128 mg/dL

## 2022-01-19 ENCOUNTER — Inpatient Hospital Stay: Payer: Medicare Other | Admitting: Family Medicine

## 2022-01-20 ENCOUNTER — Other Ambulatory Visit: Payer: Self-pay | Admitting: Family Medicine

## 2022-01-20 DIAGNOSIS — F419 Anxiety disorder, unspecified: Secondary | ICD-10-CM

## 2022-01-21 ENCOUNTER — Telehealth: Payer: Self-pay | Admitting: Family Medicine

## 2022-01-21 ENCOUNTER — Other Ambulatory Visit: Payer: Self-pay | Admitting: Family Medicine

## 2022-01-21 DIAGNOSIS — B370 Candidal stomatitis: Secondary | ICD-10-CM

## 2022-01-21 MED ORDER — NYSTATIN 100000 UNIT/ML MT SUSP: 5.0000 mL | Freq: Four times a day (QID) | OROMUCOSAL | 0 refills | Status: AC

## 2022-01-21 NOTE — Telephone Encounter (Signed)
Recommend trying OTC Monistat for now due to risk of side effects and/or med interaction with oral medication. We will discuss other options, if needed, during hospital follow-up. Thanks, BJ

## 2022-01-21 NOTE — Telephone Encounter (Signed)
Hospital discharged her on Augmentin.

## 2022-01-21 NOTE — Telephone Encounter (Signed)
Pt taking a medication that caused a yeast infection requesting meds for treatment.

## 2022-01-21 NOTE — Telephone Encounter (Signed)
I spoke with patient. She was using an inhaler at the hospital & they didn't tell her to rinse her mouth afterwards, so she has developed thrush, not a yeast infection.

## 2022-01-21 NOTE — Telephone Encounter (Signed)
Patient is aware 

## 2022-01-21 NOTE — Telephone Encounter (Signed)
Rx for Nystatin sol sent to use for 14 days as instructed. Thanks, BJ

## 2022-01-24 ENCOUNTER — Other Ambulatory Visit: Payer: Self-pay | Admitting: Family Medicine

## 2022-01-28 ENCOUNTER — Inpatient Hospital Stay: Payer: Medicare Other | Admitting: Family Medicine

## 2022-02-06 ENCOUNTER — Other Ambulatory Visit: Payer: Self-pay | Admitting: Family Medicine

## 2022-02-11 ENCOUNTER — Inpatient Hospital Stay: Payer: Medicare Other | Admitting: Family Medicine

## 2022-02-15 NOTE — Progress Notes (Deleted)
HPI:  [name] is a 76 y.o. female, who is here today to follow on hospital visit.  Review of Systems Rest see pertinent positives and negatives per HPI.  Current Outpatient Medications on File Prior to Visit  Medication Sig Dispense Refill   ACCU-CHEK GUIDE test strip USE TO CHECK BLOOD SUGARS 4 TIMES DAILY 100 strip 1   Accu-Chek Softclix Lancets lancets Use to check blood sugars 1-2 times daily. Dx:e11.9 100 each 12   albuterol (VENTOLIN HFA) 108 (90 Base) MCG/ACT inhaler INHALE 1 PUFF EVERY 4 HOURS AS NEEDED FOR WHEEZE OR FOR SHORTNESS OF BREATH (Patient taking differently: Inhale 1 puff into the lungs every 4 (four) hours as needed for wheezing or shortness of breath.) 8.5 each 2   aspirin EC 81 MG EC tablet Take 1 tablet (81 mg total) by mouth daily.     atorvastatin (LIPITOR) 80 MG tablet TAKE 1 TABLET BY MOUTH EVERY DAY AT 6PM 90 tablet 0   Blood Glucose Monitoring Suppl (ACCU-CHEK AVIVA PLUS) w/Device KIT Use to test blood sugars 1-2 times daily. Dx: E11.9 1 kit 0   cetirizine (ZYRTEC) 10 MG tablet Take 10 mg by mouth daily.     escitalopram (LEXAPRO) 20 MG tablet Take 1 tablet (20 mg total) by mouth daily. 90 tablet 1   ezetimibe (ZETIA) 10 MG tablet TAKE 1 TABLET BY MOUTH EVERY DAY (Patient taking differently: Take 10 mg by mouth daily.) 90 tablet 1   fenofibrate 54 MG tablet TAKE 1 TABLET BY MOUTH EVERY DAY (Patient taking differently: Take 54 mg by mouth daily.) 90 tablet 1   ferrous gluconate (FERGON) 324 MG tablet TAKE 1 TABLET BY MOUTH TWICE A DAY WITH MEALS (Patient not taking: Reported on 01/14/2022) 749 tablet 1   folic acid (FOLVITE) 1 MG tablet TAKE 1 TABLET BY MOUTH EVERY DAY (Patient taking differently: Take 1 mg by mouth daily.) 90 tablet 1   furosemide (LASIX) 40 MG tablet TAKE 1 TABLET BY MOUTH EVERY DAY (Patient taking differently: Take 40 mg by mouth daily.) 90 tablet 1   glipiZIDE (GLUCOTROL) 5 MG tablet TAKE 1 TABLET BY MOUTH DAILY BEFORE  BREAKFAST. 20-30 MINUTES BEFORE MEAL. (Patient taking differently: Take 5 mg by mouth daily before breakfast.) 90 tablet 2   KLOR-CON M20 20 MEQ tablet TAKE 1 TABLET BY MOUTH EVERY DAY (Patient not taking: Reported on 01/14/2022) 90 tablet 0   metFORMIN (GLUCOPHAGE) 500 MG tablet TAKE 1 TABLET BY MOUTH 2 TIMES DAILY WITH A MEAL. (Patient taking differently: Take 500 mg by mouth 2 (two) times daily with a meal.) 180 tablet 1   montelukast (SINGULAIR) 10 MG tablet Take 1 tablet (10 mg total) by mouth at bedtime. 30 tablet 3   nebivolol (BYSTOLIC) 5 MG tablet TAKE 1 TABLET BY MOUTH EVERY DAY (Patient taking differently: Take 5 mg by mouth daily.) 90 tablet 1   nitroGLYCERIN (NITROSTAT) 0.4 MG SL tablet Place 1 tablet (0.4 mg total) under the tongue every 5 (five) minutes as needed for chest pain. 25 tablet 3   ranitidine (ZANTAC) 150 MG tablet Take 150 mg by mouth 2 (two) times daily.     tiotropium (SPIRIVA HANDIHALER) 18 MCG inhalation capsule INHALE 1 CAPSULE VIA HANDIHALER ONCE DAILY AT THE SAME TIME EVERY DAY (Patient taking differently: Place 18 mcg into inhaler and inhale daily.) 90 capsule 3   triamcinolone (NASACORT ALLERGY 24HR) 55 MCG/ACT AERO nasal inhaler Place 2 sprays into the nose daily.  vitamin B-12 1000 MCG tablet Take 1 tablet (1,000 mcg total) by mouth daily.     No current facility-administered medications on file prior to visit.    Past Medical History:  Diagnosis Date   Allergy    Asthma    CAD (coronary artery disease)    COPD (chronic obstructive pulmonary disease) (HCC)    Depression    GERD (gastroesophageal reflux disease)    Hyperlipidemia    Hypertension    Myocardial infarction ([place]) 10/16/2013   No Known Allergies  Social History   Socioeconomic History   Marital status: Widowed    Spouse name: Not on file   Number of children: Not on file   Years of education: Not on file   Highest education level: Not on file  Occupational History   Not on file   Tobacco Use   Smoking status: Former    Packs/day: 1.00    Years: 45.00    Total pack years: 45.00    Types: Cigarettes    Quit date: 10/16/2013    Years since quitting: 8.3   Smokeless tobacco: Never  Vaping Use   Vaping Use: Never used  Substance and Sexual Activity   Alcohol use: Yes    Comment: occasional   Drug use: No   Sexual activity: Not Currently  Other Topics Concern   Not on file  Social History Narrative   Not on file   Social Determinants of Health   Financial Resource Strain: Low Risk  (04/29/2021)   Overall Financial Resource Strain (CARDIA)    Difficulty of Paying Living Expenses: Not hard at all  Food Insecurity: No Food Insecurity (04/29/2021)   Hunger Vital Sign    Worried About Running Out of Food in the Last Year: Never true    [place] in the Last Year: Never true  Transportation Needs: No Transportation Needs (04/29/2021)   PRAPARE - Hydrologist (Medical): No    Lack of Transportation (Non-Medical): No  Physical Activity: Inactive (04/29/2021)   Exercise Vital Sign    Days of Exercise per Week: 0 days    Minutes of Exercise per Session: 0 min  Stress: No Stress Concern Present (04/29/2021)   [place]    Feeling of Stress : Not at all  Social Connections: Socially Isolated (04/29/2021)   Social Connection and Isolation Panel [NHANES]    Frequency of Communication with Friends and Family: Twice a week    Frequency of Social Gatherings with Friends and Family: Twice a week    Attends Religious Services: Never    Marine scientist or Organizations: No    Attends Archivist Meetings: Never    Marital Status: Widowed    There were no vitals filed for this visit. There is no height or weight on file to calculate BMI.  Physical Exam  ASSESSMENT AND PLAN:   There are no diagnoses linked to this encounter.  No orders of the  defined types were placed in this encounter.   No problem-specific Assessment & Plan notes found for this encounter.   No follow-ups on file.   Betty G. Martinique, MD  [facility]. [place] office.

## 2022-02-16 ENCOUNTER — Inpatient Hospital Stay: Payer: Medicare Other | Admitting: Family Medicine

## 2022-02-21 NOTE — Progress Notes (Deleted)
HPI:  Beverly Wheeler is a 76 y.o. female, who is here today to follow on recent hospital visit. Hospitalized from 01/14/22 and discharged on 01/16/22 for MS changed. Acute on chronic respiratory failure with hypoxia and hypercapnia: PH 7.29 and PCO2 103.9. Treated with BiPap, which was tapered to [place]. Solumedrol and Duoneb during hospitalization. CXR negative for pneumonia. COVID 19 negative. Discharged on Prednisone 40 mg daily x 3 days. Lab Results  Component Value Date   WBC 7.8 01/16/2022   HGB 10.2 (L) 01/16/2022   HCT 30.8 (L) 01/16/2022   MCV 96.9 01/16/2022   PLT 184 54/65/0354   Acute metabolic encephalopathy: Head CT negative for acute process. Elevated ammonia upon initial evaluation.  UTI: Treated with Rocephin IV x 2 d, discharged on Augmentin 875-125 mg bid x 3 d. Troponin mildly elevated on admission, flat.  Review of Systems Rest see pertinent positives and negatives per HPI.  Current Outpatient Medications on File Prior to Visit  Medication Sig Dispense Refill   ACCU-CHEK GUIDE test strip USE TO CHECK BLOOD SUGARS 4 TIMES DAILY 100 strip 1   Accu-Chek Softclix Lancets lancets Use to check blood sugars 1-2 times daily. Dx:e11.9 100 each 12   albuterol (VENTOLIN HFA) 108 (90 Base) MCG/ACT inhaler INHALE 1 PUFF EVERY 4 HOURS AS NEEDED FOR WHEEZE OR FOR SHORTNESS OF BREATH (Patient taking differently: Inhale 1 puff into the lungs every 4 (four) hours as needed for wheezing or shortness of breath.) 8.5 each 2   aspirin EC 81 MG EC tablet Take 1 tablet (81 mg total) by mouth daily.     atorvastatin (LIPITOR) 80 MG tablet TAKE 1 TABLET BY MOUTH EVERY DAY AT 6PM 90 tablet 0   Blood Glucose Monitoring Suppl (ACCU-CHEK AVIVA PLUS) w/Device KIT Use to test blood sugars 1-2 times daily. Dx: E11.9 1 kit 0   cetirizine (ZYRTEC) 10 MG tablet Take 10 mg by mouth daily.     escitalopram (LEXAPRO) 20 MG tablet Take 1 tablet (20 mg total) by mouth daily. 90 tablet 1    ezetimibe (ZETIA) 10 MG tablet TAKE 1 TABLET BY MOUTH EVERY DAY (Patient taking differently: Take 10 mg by mouth daily.) 90 tablet 1   fenofibrate 54 MG tablet TAKE 1 TABLET BY MOUTH EVERY DAY (Patient taking differently: Take 54 mg by mouth daily.) 90 tablet 1   ferrous gluconate (FERGON) 324 MG tablet TAKE 1 TABLET BY MOUTH TWICE A DAY WITH MEALS (Patient not taking: Reported on 01/14/2022) 656 tablet 1   folic acid (FOLVITE) 1 MG tablet TAKE 1 TABLET BY MOUTH EVERY DAY (Patient taking differently: Take 1 mg by mouth daily.) 90 tablet 1   furosemide (LASIX) 40 MG tablet TAKE 1 TABLET BY MOUTH EVERY DAY (Patient taking differently: Take 40 mg by mouth daily.) 90 tablet 1   glipiZIDE (GLUCOTROL) 5 MG tablet TAKE 1 TABLET BY MOUTH DAILY BEFORE BREAKFAST. 20-30 MINUTES BEFORE MEAL. (Patient taking differently: Take 5 mg by mouth daily before breakfast.) 90 tablet 2   KLOR-CON M20 20 MEQ tablet TAKE 1 TABLET BY MOUTH EVERY DAY (Patient not taking: Reported on 01/14/2022) 90 tablet 0   metFORMIN (GLUCOPHAGE) 500 MG tablet TAKE 1 TABLET BY MOUTH 2 TIMES DAILY WITH A MEAL. (Patient taking differently: Take 500 mg by mouth 2 (two) times daily with a meal.) 180 tablet 1   montelukast (SINGULAIR) 10 MG tablet Take 1 tablet (10 mg total) by mouth at bedtime. 30 tablet 3  nebivolol (BYSTOLIC) 5 MG tablet TAKE 1 TABLET BY MOUTH EVERY DAY (Patient taking differently: Take 5 mg by mouth daily.) 90 tablet 1   nitroGLYCERIN (NITROSTAT) 0.4 MG SL tablet Place 1 tablet (0.4 mg total) under the tongue every 5 (five) minutes as needed for chest pain. 25 tablet 3   ranitidine (ZANTAC) 150 MG tablet Take 150 mg by mouth 2 (two) times daily.     tiotropium (SPIRIVA HANDIHALER) 18 MCG inhalation capsule INHALE 1 CAPSULE VIA HANDIHALER ONCE DAILY AT THE SAME TIME EVERY DAY (Patient taking differently: Place 18 mcg into inhaler and inhale daily.) 90 capsule 3   triamcinolone (NASACORT ALLERGY 24HR) 55 MCG/ACT AERO nasal inhaler  Place 2 sprays into the nose daily.     vitamin B-12 1000 MCG tablet Take 1 tablet (1,000 mcg total) by mouth daily.     No current facility-administered medications on file prior to visit.    Past Medical History:  Diagnosis Date   Allergy    Asthma    CAD (coronary artery disease)    COPD (chronic obstructive pulmonary disease) (HCC)    Depression    GERD (gastroesophageal reflux disease)    Hyperlipidemia    Hypertension    Myocardial infarction ([place]) 10/16/2013   No Known Allergies  Social History   Socioeconomic History   Marital status: Widowed    Spouse name: Not on file   Number of children: Not on file   Years of education: Not on file   Highest education level: Not on file  Occupational History   Not on file  Tobacco Use   Smoking status: Former    Packs/day: 1.00    Years: 45.00    Total pack years: 45.00    Types: Cigarettes    Quit date: 10/16/2013    Years since quitting: 8.3   Smokeless tobacco: Never  Vaping Use   Vaping Use: Never used  Substance and Sexual Activity   Alcohol use: Yes    Comment: occasional   Drug use: No   Sexual activity: Not Currently  Other Topics Concern   Not on file  Social History Narrative   Not on file   Social Determinants of Health   Financial Resource Strain: Low Risk  (04/29/2021)   Overall Financial Resource Strain (CARDIA)    Difficulty of Paying Living Expenses: Not hard at all  Food Insecurity: No Food Insecurity (04/29/2021)   Hunger Vital Sign    Worried About Running Out of Food in the Last Year: Never true    [place] in the Last Year: Never true  Transportation Needs: No Transportation Needs (04/29/2021)   PRAPARE - Hydrologist (Medical): No    Lack of Transportation (Non-Medical): No  Physical Activity: Inactive (04/29/2021)   Exercise Vital Sign    Days of Exercise per Week: 0 days    Minutes of Exercise per Session: 0 min  Stress: No Stress Concern Present  (04/29/2021)   [place]    Feeling of Stress : Not at all  Social Connections: Socially Isolated (04/29/2021)   Social Connection and Isolation Panel [NHANES]    Frequency of Communication with Friends and Family: Twice a week    Frequency of Social Gatherings with Friends and Family: Twice a week    Attends Religious Services: Never    Marine scientist or Organizations: No    Attends Archivist Meetings:  Never    Marital Status: Widowed    There were no vitals filed for this visit. There is no height or weight on file to calculate BMI.  Physical Exam  ASSESSMENT AND PLAN:   There are no diagnoses linked to this encounter.  No orders of the defined types were placed in this encounter.   No problem-specific Assessment & Plan notes found for this encounter.   No follow-ups on file.   Betty G. Martinique, MD  [facility] [place] office.

## 2022-02-22 ENCOUNTER — Inpatient Hospital Stay: Payer: Medicare Other | Admitting: Family Medicine

## 2022-02-28 NOTE — Progress Notes (Deleted)
  HPI:  Beverly Wheeler is a 76 y.o. female, who is here today to follow on recent hospital visit. Hospitalized from 01/14/22 and discharged on 01/16/22 for MS changed. Acute on chronic respiratory failure with hypoxia and hypercapnia: PH 7.29 and PCO2 103.9. Treated with BiPap, which was tapered to [place]. Solumedrol and Duoneb during hospitalization. CXR negative for pneumonia. COVID 19 negative. Discharged on Prednisone 40 mg daily x 3 days. Lab Results  Component Value Date   WBC 7.8 01/16/2022   HGB 10.2 (L) 01/16/2022   HCT 30.8 (L) 01/16/2022   MCV 96.9 01/16/2022   PLT 184 01/16/2022   Acute metabolic encephalopathy: Head CT negative for acute process. Elevated ammonia upon initial evaluation.  UTI: Treated with Rocephin IV x 2 d, discharged on Augmentin 875-125 mg bid x 3 d. Troponin mildly elevated on admission, flat.  Review of Systems Rest see pertinent positives and negatives per HPI.  Current Outpatient Medications on File Prior to Visit  Medication Sig Dispense Refill   ACCU-CHEK GUIDE test strip USE TO CHECK BLOOD SUGARS 4 TIMES DAILY 100 strip 1   Accu-Chek Softclix Lancets lancets Use to check blood sugars 1-2 times daily. Dx:e11.9 100 each 12   albuterol (VENTOLIN HFA) 108 (90 Base) MCG/ACT inhaler INHALE 1 PUFF EVERY 4 HOURS AS NEEDED FOR WHEEZE OR FOR SHORTNESS OF BREATH (Patient taking differently: Inhale 1 puff into the lungs every 4 (four) hours as needed for wheezing or shortness of breath.) 8.5 each 2   aspirin EC 81 MG EC tablet Take 1 tablet (81 mg total) by mouth daily.     atorvastatin (LIPITOR) 80 MG tablet TAKE 1 TABLET BY MOUTH EVERY DAY AT 6PM 90 tablet 0   Blood Glucose Monitoring Suppl (ACCU-CHEK AVIVA PLUS) w/Device KIT Use to test blood sugars 1-2 times daily. Dx: E11.9 1 kit 0   cetirizine (ZYRTEC) 10 MG tablet Take 10 mg by mouth daily.     escitalopram (LEXAPRO) 20 MG tablet Take 1 tablet (20 mg total) by mouth daily. 90 tablet 1    ezetimibe (ZETIA) 10 MG tablet TAKE 1 TABLET BY MOUTH EVERY DAY (Patient taking differently: Take 10 mg by mouth daily.) 90 tablet 1   fenofibrate 54 MG tablet TAKE 1 TABLET BY MOUTH EVERY DAY (Patient taking differently: Take 54 mg by mouth daily.) 90 tablet 1   ferrous gluconate (FERGON) 324 MG tablet TAKE 1 TABLET BY MOUTH TWICE A DAY WITH MEALS (Patient not taking: Reported on 01/14/2022) 180 tablet 1   folic acid (FOLVITE) 1 MG tablet TAKE 1 TABLET BY MOUTH EVERY DAY (Patient taking differently: Take 1 mg by mouth daily.) 90 tablet 1   furosemide (LASIX) 40 MG tablet TAKE 1 TABLET BY MOUTH EVERY DAY (Patient taking differently: Take 40 mg by mouth daily.) 90 tablet 1   glipiZIDE (GLUCOTROL) 5 MG tablet TAKE 1 TABLET BY MOUTH DAILY BEFORE BREAKFAST. 20-30 MINUTES BEFORE MEAL. (Patient taking differently: Take 5 mg by mouth daily before breakfast.) 90 tablet 2   KLOR-CON M20 20 MEQ tablet TAKE 1 TABLET BY MOUTH EVERY DAY (Patient not taking: Reported on 01/14/2022) 90 tablet 0   metFORMIN (GLUCOPHAGE) 500 MG tablet TAKE 1 TABLET BY MOUTH 2 TIMES DAILY WITH A MEAL. (Patient taking differently: Take 500 mg by mouth 2 (two) times daily with a meal.) 180 tablet 1   montelukast (SINGULAIR) 10 MG tablet Take 1 tablet (10 mg total) by mouth at bedtime. 30 tablet 3     nebivolol (BYSTOLIC) 5 MG tablet TAKE 1 TABLET BY MOUTH EVERY DAY (Patient taking differently: Take 5 mg by mouth daily.) 90 tablet 1   nitroGLYCERIN (NITROSTAT) 0.4 MG SL tablet Place 1 tablet (0.4 mg total) under the tongue every 5 (five) minutes as needed for chest pain. 25 tablet 3   ranitidine (ZANTAC) 150 MG tablet Take 150 mg by mouth 2 (two) times daily.     tiotropium (SPIRIVA HANDIHALER) 18 MCG inhalation capsule INHALE 1 CAPSULE VIA HANDIHALER ONCE DAILY AT THE SAME TIME EVERY DAY (Patient taking differently: Place 18 mcg into inhaler and inhale daily.) 90 capsule 3   triamcinolone (NASACORT ALLERGY 24HR) 55 MCG/ACT AERO nasal inhaler  Place 2 sprays into the nose daily.     vitamin B-12 1000 MCG tablet Take 1 tablet (1,000 mcg total) by mouth daily.     No current facility-administered medications on file prior to visit.    Past Medical History:  Diagnosis Date   Allergy    Asthma    CAD (coronary artery disease)    COPD (chronic obstructive pulmonary disease) (HCC)    Depression    GERD (gastroesophageal reflux disease)    Hyperlipidemia    Hypertension    Myocardial infarction (HCC) 10/16/2013   No Known Allergies  Social History   Socioeconomic History   Marital status: Widowed    Spouse name: Not on file   Number of children: Not on file   Years of education: Not on file   Highest education level: Not on file  Occupational History   Not on file  Tobacco Use   Smoking status: Former    Packs/day: 1.00    Years: 45.00    Total pack years: 45.00    Types: Cigarettes    Quit date: 10/16/2013    Years since quitting: 8.3   Smokeless tobacco: Never  Vaping Use   Vaping Use: Never used  Substance and Sexual Activity   Alcohol use: Yes    Comment: occasional   Drug use: No   Sexual activity: Not Currently  Other Topics Concern   Not on file  Social History Narrative   Not on file   Social Determinants of Health   Financial Resource Strain: Low Risk  (04/29/2021)   Overall Financial Resource Strain (CARDIA)    Difficulty of Paying Living Expenses: Not hard at all  Food Insecurity: No Food Insecurity (04/29/2021)   Hunger Vital Sign    Worried About Running Out of Food in the Last Year: Never true    Ran Out of Food in the Last Year: Never true  Transportation Needs: No Transportation Needs (04/29/2021)   PRAPARE - Transportation    Lack of Transportation (Medical): No    Lack of Transportation (Non-Medical): No  Physical Activity: Inactive (04/29/2021)   Exercise Vital Sign    Days of Exercise per Week: 0 days    Minutes of Exercise per Session: 0 min  Stress: No Stress Concern Present  (04/29/2021)   [employer] - Occupational Stress Questionnaire    Feeling of Stress : Not at all  Social Connections: Socially Isolated (04/29/2021)   Social Connection and Isolation Panel [NHANES]    Frequency of Communication with Friends and Family: Twice a week    Frequency of Social Gatherings with Friends and Family: Twice a week    Attends Religious Services: Never    Active Member of Clubs or Organizations: No    Attends Club or Organization Meetings:   Never    Marital Status: Widowed    There were no vitals filed for this visit. There is no height or weight on file to calculate BMI.  Physical Exam  ASSESSMENT AND PLAN:   There are no diagnoses linked to this encounter.  No orders of the defined types were placed in this encounter.   No problem-specific Assessment & Plan notes found for this encounter.   No follow-ups on file.   Betty G. Jordan, MD  [facility]. [place] office.                

## 2022-03-01 ENCOUNTER — Inpatient Hospital Stay: Payer: Medicare Other | Admitting: Family Medicine

## 2022-03-01 DIAGNOSIS — E1169 Type 2 diabetes mellitus with other specified complication: Secondary | ICD-10-CM

## 2022-03-01 DIAGNOSIS — N1831 Chronic kidney disease, stage 3a: Secondary | ICD-10-CM

## 2022-03-14 NOTE — Progress Notes (Unsigned)
HPI: Beverly Wheeler is a 76 y.o. female, who is here today with her son to follow on hospitalization in 12/2021. She missed a few hospital follow-up appointments. Hospitalized from 01/14/22 and discharged on 01/16/22 for MS changed.  Acute on chronic respiratory failure with hypoxia and hypercapnia: PH 7.29 and PCO2 103.9. Treated with BiPap, which was tapered to [place]. Solumedrol and Duoneb during hospitalization. CXR negative for pneumonia.Mild patchy bibasilar airspace disease left greater than right likely atelectasis. No effusion. COVID 19 negative. Discharged on Prednisone 40 mg daily x 3 days and Dulera 200-5 mcg 2 puffs twice daily. No cough since hospital d/c. Albuterol inh last used a month ago for wheezing. Currently she is on supplemental O2, continuously, 2 to 3 L/min. Pulse ox at home 95 to 97%.  "Little" DOE with some chores around her house. Having intermittent nasal congestion, rhinorrhea, and sneezing. Currently she is on Nasacort 2 spray daily and Singulair 10 mg daily at bedtime.  Troponin mildly elevated on admission, flat.   CAD, she has not followed with cardiologist in a couple years, she tried to arrange appointment but was told that she needed a new referral.  -Iron deficiency anemia: Currently she is not on ferrous sulfate or folic acid. She has not noted gross hematuria, melena, or blood in the stool.  Lab Results  Component Value Date   WBC 7.8 01/16/2022   HGB 10.2 (L) 01/16/2022   HCT 30.8 (L) 01/16/2022   MCV 96.9 01/16/2022   PLT 184 81/27/5170   -Acute metabolic encephalopathy: Head CT negative for acute abnormality.Chronic infarct left occipital lobe.  Chronic microvascular ischemia in the white matter. Elevated ammonia upon initial evaluation. PT through home health after hospital discharge was not done.  -UTI: Treated with Rocephin IV x 2 d, discharged on Augmentin 875-125 mg bid x 3 d. She wonders if she is still has a UTI, she has  had intermittent burning sensation with urination since hospital discharge. Negative for changes in urinary frequency.  -DM 2: Dx'ed  in 03/2022 with HgA1C of 9.0. Currently she is on metformin 500 mg twice daily and Glipizide 5 mg daily. Fasting BS 140's, 1 to 2 hours postprandial 200. Negative for polydipsia,polyuria,polyphagia,or hypoglycemic events. Occasional feet numbness and tingling   Lab Results  Component Value Date   HGBA1C 6.1 (H) 01/15/2022   Lab Results  Component Value Date   MICROALBUR 1.7 09/17/2021   -Hypertension:  Medications: Bystolic 5 mg daily. BP readings at home: 120s/70s. She has tolerated medication well. Negative for unusual or severe headache, visual changes, exertional chest pain, focal weakness, or worsening edema. -Hypokalemia: She is no longer on K-Lor. She takes furosemide 40 mg daily. Lab Results  Component Value Date   CREATININE 1.01 (H) 01/16/2022   BUN 19 01/16/2022   NA 140 01/16/2022   K 4.5 01/16/2022   CL 91 (L) 01/16/2022   CO2 39 (H) 01/16/2022   -Hyperlipidemia: Currently on atorvastatin 80 mg daily and Zetia 10 mg daily. Side effects from medication: None.  Lab Results  Component Value Date   CHOL 204 (H) 09/17/2021   HDL 31.20 (L) 09/17/2021   LDLCALC 133 (H) 09/17/2021   LDLDIRECT 145.0 08/10/2018   TRIG 200.0 (H) 09/17/2021   CHOLHDL 7 09/17/2021   -Anxiety: Currently she is on escitalopram 20 mg daily, which she thinks is helping. Denies depression-like symptoms.     03/15/2022   12:59 PM 04/29/2021    1:16 PM 04/29/2021  1:12 PM  Depression screen PHQ 2/9  Decreased Interest 0 0 0  Down, Depressed, Hopeless 0 0 0  PHQ - 2 Score 0 0 0   Review of Systems  Constitutional:  Positive for fatigue. Negative for activity change, appetite change, fever and unexpected weight change.  HENT:  Positive for congestion. Negative for mouth sores, nosebleeds and sore throat.   Eyes:  Negative for redness and visual  disturbance.  Respiratory:  Negative for chest tightness and wheezing.   Gastrointestinal:  Negative for abdominal pain, nausea and vomiting.       Negative for changes in bowel habits.  Genitourinary:  Negative for decreased urine volume and difficulty urinating.  Musculoskeletal:  Positive for arthralgias and gait problem.  Skin:  Negative for rash.  Neurological:  Negative for syncope and facial asymmetry.  Rest see pertinent positives and negatives per HPI.  Current Outpatient Medications on File Prior to Visit  Medication Sig Dispense Refill   ACCU-CHEK GUIDE test strip USE TO CHECK BLOOD SUGARS 4 TIMES DAILY 100 strip 1   Accu-Chek Softclix Lancets lancets Use to check blood sugars 1-2 times daily. Dx:e11.9 100 each 12   albuterol (VENTOLIN HFA) 108 (90 Base) MCG/ACT inhaler INHALE 1 PUFF EVERY 4 HOURS AS NEEDED FOR WHEEZE OR FOR SHORTNESS OF BREATH (Patient taking differently: Inhale 1 puff into the lungs every 4 (four) hours as needed for wheezing or shortness of breath.) 8.5 each 2   aspirin EC 81 MG EC tablet Take 1 tablet (81 mg total) by mouth daily.     atorvastatin (LIPITOR) 80 MG tablet TAKE 1 TABLET BY MOUTH EVERY DAY AT 6PM 90 tablet 0   Blood Glucose Monitoring Suppl (ACCU-CHEK AVIVA PLUS) w/Device KIT Use to test blood sugars 1-2 times daily. Dx: E11.9 1 kit 0   cetirizine (ZYRTEC) 10 MG tablet Take 10 mg by mouth daily.     ezetimibe (ZETIA) 10 MG tablet TAKE 1 TABLET BY MOUTH EVERY DAY (Patient taking differently: Take 10 mg by mouth daily.) 90 tablet 1   furosemide (LASIX) 40 MG tablet TAKE 1 TABLET BY MOUTH EVERY DAY (Patient taking differently: Take 40 mg by mouth daily.) 90 tablet 1   glipiZIDE (GLUCOTROL) 5 MG tablet TAKE 1 TABLET BY MOUTH DAILY BEFORE BREAKFAST. 20-30 MINUTES BEFORE MEAL. (Patient taking differently: Take 5 mg by mouth daily before breakfast.) 90 tablet 2   montelukast (SINGULAIR) 10 MG tablet Take 1 tablet (10 mg total) by mouth at bedtime. 30 tablet  3   nebivolol (BYSTOLIC) 5 MG tablet TAKE 1 TABLET BY MOUTH EVERY DAY (Patient taking differently: Take 5 mg by mouth daily.) 90 tablet 1   nitroGLYCERIN (NITROSTAT) 0.4 MG SL tablet Place 1 tablet (0.4 mg total) under the tongue every 5 (five) minutes as needed for chest pain. 25 tablet 3   ranitidine (ZANTAC) 150 MG tablet Take 150 mg by mouth 2 (two) times daily.     triamcinolone (NASACORT ALLERGY 24HR) 55 MCG/ACT AERO nasal inhaler Place 2 sprays into the nose daily.     vitamin B-12 1000 MCG tablet Take 1 tablet (1,000 mcg total) by mouth daily.     ferrous gluconate (FERGON) 324 MG tablet TAKE 1 TABLET BY MOUTH TWICE A DAY WITH MEALS 180 tablet 1   KLOR-CON M20 20 MEQ tablet TAKE 1 TABLET BY MOUTH EVERY DAY 90 tablet 0   No current facility-administered medications on file prior to visit.   Past Medical History:  Diagnosis Date  Allergy    Asthma    CAD (coronary artery disease)    COPD (chronic obstructive pulmonary disease) (HCC)    Depression    GERD (gastroesophageal reflux disease)    Hyperlipidemia    Hypertension    Myocardial infarction ([place]) 10/16/2013   No Known Allergies  Social History   Socioeconomic History   Marital status: Widowed    Spouse name: Not on file   Number of children: Not on file   Years of education: Not on file   Highest education level: Not on file  Occupational History   Not on file  Tobacco Use   Smoking status: Former    Packs/day: 1.00    Years: 45.00    Total pack years: 45.00    Types: Cigarettes    Quit date: 10/16/2013    Years since quitting: 8.4   Smokeless tobacco: Never  Vaping Use   Vaping Use: Never used  Substance and Sexual Activity   Alcohol use: Yes    Comment: occasional   Drug use: No   Sexual activity: Not Currently  Other Topics Concern   Not on file  Social History Narrative   Not on file   Social Determinants of Health   Financial Resource Strain: Low Risk  (04/29/2021)   Overall Financial Resource  Strain (CARDIA)    Difficulty of Paying Living Expenses: Not hard at all  Food Insecurity: No Food Insecurity (04/29/2021)   Hunger Vital Sign    Worried About Running Out of Food in the Last Year: Never true    [place] in the Last Year: Never true  Transportation Needs: No Transportation Needs (04/29/2021)   PRAPARE - Hydrologist (Medical): No    Lack of Transportation (Non-Medical): No  Physical Activity: Inactive (04/29/2021)   Exercise Vital Sign    Days of Exercise per Week: 0 days    Minutes of Exercise per Session: 0 min  Stress: No Stress Concern Present (04/29/2021)   [place]    Feeling of Stress : Not at all  Social Connections: Socially Isolated (04/29/2021)   Social Connection and Isolation Panel [NHANES]    Frequency of Communication with Friends and Family: Twice a week    Frequency of Social Gatherings with Friends and Family: Twice a week    Attends Religious Services: Never    Marine scientist or Organizations: No    Attends Archivist Meetings: Never    Marital Status: Widowed    Vitals:   03/15/22 1158  BP: 120/70  Pulse: 93  Resp: 16  SpO2: 97%   Body mass index is 35.55 kg/m.  Physical Exam Vitals and nursing note reviewed.  Constitutional:      General: She is not in acute distress.    Appearance: She is well-developed.  HENT:     Head: Normocephalic and atraumatic.     Nose: Rhinorrhea present.     Mouth/Throat:     Mouth: Mucous membranes are moist.     Pharynx: Oropharynx is clear.     Comments: Edentulous. Eyes:     Conjunctiva/sclera: Conjunctivae normal.  Cardiovascular:     Rate and Rhythm: Normal rate and regular rhythm.     Pulses:          Dorsalis pedis pulses are 2+ on the right side and 2+ on the left side.     Heart sounds: No murmur  heard.    Comments: Trace pitting LE edema, bilateral. Pulmonary:      Effort: Pulmonary effort is normal. No respiratory distress.     Breath sounds: Normal breath sounds.  Abdominal:     Palpations: Abdomen is soft.     Tenderness: There is no abdominal tenderness.  Lymphadenopathy:     Cervical: No cervical adenopathy.  Skin:    General: Skin is warm.     Findings: No erythema or rash.  Neurological:     General: No focal deficit present.     Mental Status: She is alert and oriented to person, place, and time.     Comments: Gait assisted by a walker.  Psychiatric:     Comments: Well groomed, good eye contact.   ASSESSMENT AND PLAN:  Ms.Tanishi was seen today for follow-up.  Diagnoses and all orders for this visit:  Type 2 diabetes mellitus with other specified complication, without long-term current use of insulin ([place]) -     Basic metabolic panel; Future -     Basic metabolic panel -     metFORMIN (GLUCOPHAGE) 500 MG tablet; Take 1 tablet (500 mg total) by mouth 2 (two) times daily with a meal.  COPD  GOLD II based on fev1/VC of 58%  -     DG Chest 2 View; Future -     mometasone-formoterol (DULERA) 200-5 MCG/ACT AERO; Inhale 2 puffs into the lungs 2 (two) times daily. -     tiotropium (SPIRIVA HANDIHALER) 59 MCG inhalation capsule; INHALE 1 CAPSULE VIA HANDIHALER ONCE DAILY AT THE SAME TIME EVERY DAY  Essential hypertension  Stage 3a chronic kidney disease ([place]) -     CBC; Future -     CBC  Iron deficiency anemia, unspecified iron deficiency anemia type -     CBC; Future -     CBC  Anxiety disorder, unspecified type -     escitalopram (LEXAPRO) 20 MG tablet; Take 1 tablet (20 mg total) by mouth daily.  CAD, multiple vessel -     Ambulatory referral to Cardiology  Dysuria -     Urinalysis with Culture Reflex; Future -     Urinalysis with Culture Reflex  Need for pneumococcal 20-valent conjugate vaccination -     Pneumococcal conjugate vaccine 20-valent (Prevnar 20)  Hyperlipidemia, mixed  Hypokalemia  Chronic respiratory  failure with hypoxia and hypercapnia (HCC)  Non-seasonal allergic rhinitis due to other allergic trigger    Orders Placed This Encounter  Procedures   DG Chest 2 View   Pneumococcal conjugate vaccine 20-valent (Prevnar 20)   Basic metabolic panel   CBC   Urinalysis with Culture Reflex   Ambulatory referral to Cardiology    Essential hypertension BP adequately controlled. Continue Bystolic 5 mg daily. Continue monitoring BP regularly. Low-salt/DASH diet recommended.  Iron deficiency anemia Currently she is not on iron supplementation. Further recommendation will be given according to CBC results. She is not interested in having colonoscopy at this time.  Hyperlipidemia, mixed LDL is not at goal. Continue atorvastatin 80 mg daily and Zetia 10 mg daily. Low-fat diet also recommended. She is not fasting today, so we will plan on fasting lipid panel at next visit here in the office if not done during appointment with cardiologist.  Hypokalemia She is not on K-Lor at this time and is still taking furosemide 40 mg daily. Further recommendation will be given according to BMP results.   CKD (chronic kidney disease), stage III Problem has been stable.  Low-salt diet, adequate hydration,good glucose and BP control, as well as NSAID's avoidance recommended.   Chronic respiratory failure with hypoxia and hypercapnia (HCC) Currently she is on continuous O2 supplementation 3 LMP. Recommend trying to decrease dose to 2 LPM and to use during activity and nocturnal while asleep. Instructed about warning signs.  CAD, multiple vessel Continue atorvastatin 80 mg daily, Zetia 10 mg daily, and Bystolic 5 mg daily. Cardiology referral placed.  COPD  GOLD II based on fev1/VC of 58%  Problem is otherwise stable. Continue Dulera 200-5 mcg 2 puff twice daily and Spiriva 1 capsule daily. Continue albuterol inhaler 2 puff every 4-6 hours as needed. Decreasing O2 supplementation from 3 L/min  to 2 L/min prn with activity and through the night recommended.  Allergic rhinitis Still symptomatic, mild and stable. Continue Nasacort 2 sprays daily as needed and Singulair 10 mg daily. Nasal saline irrigations as needed.  I spent a total of 68 minutes in both face to face and non face to face activities for this visit on the date of this encounter. During this time history was obtained and documented, examination was performed, prior labs/imaging reviewed, and assessment/plan discussed.  Return in about 2 months (around 05/15/2022).  Aubreyanna Dorrough G. [place], MD  [facility] [place] office.

## 2022-03-15 ENCOUNTER — Ambulatory Visit (INDEPENDENT_AMBULATORY_CARE_PROVIDER_SITE_OTHER): Payer: Medicare Other | Admitting: Family Medicine

## 2022-03-15 ENCOUNTER — Encounter: Payer: Self-pay | Admitting: Family Medicine

## 2022-03-15 ENCOUNTER — Ambulatory Visit (INDEPENDENT_AMBULATORY_CARE_PROVIDER_SITE_OTHER): Payer: Medicare Other

## 2022-03-15 VITALS — BP 120/70 | HR 93 | Resp 16 | Ht 61.0 in | Wt 188.1 lb

## 2022-03-15 DIAGNOSIS — J9611 Chronic respiratory failure with hypoxia: Secondary | ICD-10-CM

## 2022-03-15 DIAGNOSIS — D509 Iron deficiency anemia, unspecified: Secondary | ICD-10-CM

## 2022-03-15 DIAGNOSIS — J449 Chronic obstructive pulmonary disease, unspecified: Secondary | ICD-10-CM | POA: Diagnosis not present

## 2022-03-15 DIAGNOSIS — Z23 Encounter for immunization: Secondary | ICD-10-CM

## 2022-03-15 DIAGNOSIS — E876 Hypokalemia: Secondary | ICD-10-CM | POA: Diagnosis not present

## 2022-03-15 DIAGNOSIS — N1831 Chronic kidney disease, stage 3a: Secondary | ICD-10-CM

## 2022-03-15 DIAGNOSIS — E1169 Type 2 diabetes mellitus with other specified complication: Secondary | ICD-10-CM

## 2022-03-15 DIAGNOSIS — E782 Mixed hyperlipidemia: Secondary | ICD-10-CM | POA: Diagnosis not present

## 2022-03-15 DIAGNOSIS — J9612 Chronic respiratory failure with hypercapnia: Secondary | ICD-10-CM

## 2022-03-15 DIAGNOSIS — J3089 Other allergic rhinitis: Secondary | ICD-10-CM | POA: Diagnosis not present

## 2022-03-15 DIAGNOSIS — I10 Essential (primary) hypertension: Secondary | ICD-10-CM

## 2022-03-15 DIAGNOSIS — J9811 Atelectasis: Secondary | ICD-10-CM | POA: Diagnosis not present

## 2022-03-15 DIAGNOSIS — I2510 Atherosclerotic heart disease of native coronary artery without angina pectoris: Secondary | ICD-10-CM

## 2022-03-15 DIAGNOSIS — F419 Anxiety disorder, unspecified: Secondary | ICD-10-CM

## 2022-03-15 DIAGNOSIS — R300 Dysuria: Secondary | ICD-10-CM

## 2022-03-15 LAB — CBC
HCT: 30.2 % — ABNORMAL LOW (ref 36.0–46.0)
Hemoglobin: 10.3 g/dL — ABNORMAL LOW (ref 12.0–15.0)
MCHC: 34 g/dL (ref 30.0–36.0)
MCV: 99 fl (ref 78.0–100.0)
Platelets: 184 10*3/uL (ref 150.0–400.0)
RBC: 3.05 Mil/uL — ABNORMAL LOW (ref 3.87–5.11)
RDW: 13.5 % (ref 11.5–15.5)
WBC: 9.3 10*3/uL (ref 4.0–10.5)

## 2022-03-15 LAB — BASIC METABOLIC PANEL
BUN: 11 mg/dL (ref 6–23)
CO2: 38 mEq/L — ABNORMAL HIGH (ref 19–32)
Calcium: 8.8 mg/dL (ref 8.4–10.5)
Chloride: 94 mEq/L — ABNORMAL LOW (ref 96–112)
Creatinine, Ser: 1.02 mg/dL (ref 0.40–1.20)
GFR: 54.43 mL/min — ABNORMAL LOW (ref >60.00)
Glucose, Bld: 230 mg/dL — ABNORMAL HIGH (ref 70–99)
Potassium: 3.5 mEq/L (ref 3.5–5.1)
Sodium: 142 mEq/L (ref 135–145)

## 2022-03-15 MED ORDER — SPIRIVA HANDIHALER 18 MCG IN CAPS: ORAL_CAPSULE | RESPIRATORY_TRACT | 3 refills | Status: DC

## 2022-03-15 MED ORDER — ESCITALOPRAM OXALATE 20 MG PO TABS: 20.0000 mg | ORAL_TABLET | Freq: Every day | ORAL | 2 refills | Status: DC

## 2022-03-15 MED ORDER — METFORMIN HCL 500 MG PO TABS: 500.0000 mg | ORAL_TABLET | Freq: Two times a day (BID) | ORAL | 1 refills | Status: DC

## 2022-03-15 MED ORDER — MOMETASONE FURO-FORMOTEROL FUM 200-5 MCG/ACT IN AERO: 2.0000 | INHALATION_SPRAY | Freq: Two times a day (BID) | RESPIRATORY_TRACT | 2 refills | Status: DC

## 2022-03-15 NOTE — Assessment & Plan Note (Signed)
Continue atorvastatin 80 mg daily, Zetia 10 mg daily, and Bystolic 5 mg daily. Cardiology referral placed.

## 2022-03-15 NOTE — Assessment & Plan Note (Signed)
Problem is otherwise stable. Continue Dulera 200-5 mcg 2 puff twice daily and Spiriva 1 capsule daily. Continue albuterol inhaler 2 puff every 4-6 hours as needed. Decreasing O2 supplementation from 3 L/min to 2 L/min prn with activity and through the night recommended.

## 2022-03-15 NOTE — Assessment & Plan Note (Signed)
LDL is not at goal. Continue atorvastatin 80 mg daily and Zetia 10 mg daily. Low-fat diet also recommended. She is not fasting today, so we will plan on fasting lipid panel at next visit here in the office if not done during appointment with cardiologist.

## 2022-03-15 NOTE — Assessment & Plan Note (Signed)
Currently she is not on iron supplementation. Further recommendation will be given according to CBC results. She is not interested in having colonoscopy at this time.

## 2022-03-15 NOTE — Assessment & Plan Note (Signed)
BP adequately controlled. Continue Bystolic 5 mg daily. Continue monitoring BP regularly. Low-salt/DASH diet recommended.

## 2022-03-15 NOTE — Assessment & Plan Note (Signed)
She is not on K-Lor at this time and is still taking furosemide 40 mg daily. Further recommendation will be given according to BMP results.

## 2022-03-15 NOTE — Assessment & Plan Note (Signed)
Currently she is on continuous O2 supplementation 3 LMP. Recommend trying to decrease dose to 2 LPM and to use during activity and nocturnal while asleep. Instructed about warning signs.

## 2022-03-15 NOTE — Assessment & Plan Note (Signed)
Problem has been stable. Low-salt diet, adequate hydration,good glucose and BP control, as well as NSAID's avoidance recommended.

## 2022-03-15 NOTE — Assessment & Plan Note (Addendum)
Still symptomatic, mild and stable. Continue Nasacort 2 sprays daily as needed and Singulair 10 mg daily. Nasal saline irrigations as needed.

## 2022-03-15 NOTE — Patient Instructions (Addendum)
A few things to remember from today's visit:   Type 2 diabetes mellitus with other specified complication, without long-term current use of insulin (HCC) - Plan: Basic metabolic panel  COPD  GOLD II based on fev1/VC of 58%  - Plan: DG Chest 2 View  Essential hypertension  Stage 3a chronic kidney disease (HCC) - Plan: CBC  Iron deficiency anemia, unspecified iron deficiency anemia type - Plan: CBC  Anxiety disorder, unspecified type  CAD, multiple vessel - Plan: Ambulatory referral to Cardiology  Dysuria - Plan: Urinalysis with Culture Reflex  If you need refills please call your pharmacy. Do not use My Chart to request refills or for acute issues that need immediate attention.   Please be sure medication list is accurate. If a new problem present, please set up appointment sooner than planned today.  Try to decrease supplemental oxygen, goal is to have oxygen saturation (with finger device) 89 or higher. Try oxygen with activity and night when in bed at 2 liters per minute.  No changes in rest. Please arrange appt with eye doctor.

## 2022-03-16 LAB — URINALYSIS W MICROSCOPIC + REFLEX CULTURE
Bacteria, UA: NONE SEEN /HPF
Bilirubin Urine: NEGATIVE
Glucose, UA: NEGATIVE
Hgb urine dipstick: NEGATIVE
Leukocyte Esterase: NEGATIVE
Nitrites, Initial: NEGATIVE
Specific Gravity, Urine: 1.037 — ABNORMAL HIGH (ref 1.001–1.035)
pH: <=5 (ref 5.0–8.0)

## 2022-03-16 LAB — NO CULTURE INDICATED

## 2022-03-29 ENCOUNTER — Other Ambulatory Visit: Payer: Self-pay | Admitting: Family Medicine

## 2022-04-29 ENCOUNTER — Ambulatory Visit: Payer: Medicare Other | Admitting: Cardiovascular Disease

## 2022-05-02 ENCOUNTER — Ambulatory Visit (INDEPENDENT_AMBULATORY_CARE_PROVIDER_SITE_OTHER): Payer: Medicare Other

## 2022-05-02 VITALS — Ht 61.0 in | Wt 185.0 lb

## 2022-05-02 DIAGNOSIS — Z0000 Encounter for general adult medical examination without abnormal findings: Secondary | ICD-10-CM

## 2022-05-02 NOTE — Progress Notes (Signed)
I connected with Beverly Wheeler today by telephone and verified that I am speaking with the correct person using two identifiers. Location patient: home Location provider: work Persons participating in the virtual visit: Beverly Wheeler, Zalenski LPN.   I discussed the limitations, risks, security and privacy concerns of performing an evaluation and management service by telephone and the availability of in person appointments. I also discussed with the patient that there may be a patient responsible charge related to this service. The patient expressed understanding and verbally consented to this telephonic visit.    Interactive audio and video telecommunications were attempted between this provider and patient, however failed, due to patient having technical difficulties OR patient did not have access to video capability.  We continued and completed visit with audio only.     Vital signs may be patient reported or missing.  Subjective:   Beverly Wheeler is a 76 y.o. female who presents for Medicare Annual (Subsequent) preventive examination.  Review of Systems     Cardiac Risk Factors include: advanced age (>64mn, >>58women);diabetes mellitus;dyslipidemia;hypertension;obesity (BMI >30kg/m2)     Objective:    Today's Vitals   05/02/22 1131  Weight: 185 lb (83.9 kg)  Height: _0  (1.549 m)   Body mass index is 34.96 kg/m.     05/02/2022   11:42 AM 01/14/2022    2:04 PM 04/29/2021    1:14 PM 11/24/2017    6:00 PM 11/23/2017    4:15 PM 10/16/2013   10:20 PM  Advanced Directives  Does Patient Have a Medical Advance Directive? _1  Patient does not have advance directive;Patient would not like information  Would patient like information on creating a medical advance directive?  No - Guardian declined No - Patient declined Yes (Inpatient - patient requests chaplain consult to create a medical advance directive) No - Patient declined   Pre-existing out of facility DNR  order (yellow form or pink MOST form)      No    Current Medications (verified) Outpatient Encounter Medications as of 05/02/2022  Medication Sig   ACCU-CHEK GUIDE test strip USE TO CHECK BLOOD SUGARS 4 TIMES DAILY   Accu-Chek Softclix Lancets lancets Use to check blood sugars 1-2 times daily. Dx:e11.9   albuterol (VENTOLIN HFA) 108 (90 Base) MCG/ACT inhaler INHALE 1 PUFF EVERY 4 HOURS AS NEEDED FOR WHEEZE OR FOR SHORTNESS OF BREATH (Patient taking differently: Inhale 1 puff into the lungs every 4 (four) hours as needed for wheezing or shortness of breath.)   aspirin EC 81 MG EC tablet Take 1 tablet (81 mg total) by mouth daily.   atorvastatin (LIPITOR) 80 MG tablet TAKE 1 TABLET BY MOUTH EVERY DAY AT 6PM   Blood Glucose Monitoring Suppl (ACCU-CHEK AVIVA PLUS) w/Device KIT Use to test blood sugars 1-2 times daily. Dx: E11.9   cetirizine (ZYRTEC) 10 MG tablet Take 10 mg by mouth daily.   escitalopram (LEXAPRO) 20 MG tablet Take 1 tablet (20 mg total) by mouth daily.   ezetimibe (ZETIA) 10 MG tablet TAKE 1 TABLET BY MOUTH EVERY DAY (Patient taking differently: Take 10 mg by mouth daily.)   furosemide (LASIX) 40 MG tablet TAKE 1 TABLET BY MOUTH EVERY DAY (Patient taking differently: Take 40 mg by mouth daily.)   glipiZIDE (GLUCOTROL) 5 MG tablet TAKE 1 TABLET BY MOUTH DAILY BEFORE BREAKFAST. 20-30 MINUTES BEFORE MEAL. (Patient taking differently: Take 5 mg by mouth daily before breakfast.)   KLOR-CON M20 20 MEQ tablet TAKE  1 TABLET BY MOUTH EVERY DAY   metFORMIN (GLUCOPHAGE) 500 MG tablet Take 1 tablet (500 mg total) by mouth 2 (two) times daily with a meal.   mometasone-formoterol (DULERA) 200-5 MCG/ACT AERO Inhale 2 puffs into the lungs 2 (two) times daily.   montelukast (SINGULAIR) 10 MG tablet Take 1 tablet (10 mg total) by mouth at bedtime.   nebivolol (BYSTOLIC) 5 MG tablet TAKE 1 TABLET BY MOUTH EVERY DAY   nitroGLYCERIN (NITROSTAT) 0.4 MG SL tablet Place 1 tablet (0.4 mg total) under the  tongue every 5 (five) minutes as needed for chest pain.   ranitidine (ZANTAC) 150 MG tablet Take 150 mg by mouth 2 (two) times daily.   tiotropium (SPIRIVA HANDIHALER) 18 MCG inhalation capsule INHALE 1 CAPSULE VIA HANDIHALER ONCE DAILY AT THE SAME TIME EVERY DAY   triamcinolone (NASACORT ALLERGY 24HR) 55 MCG/ACT AERO nasal inhaler Place 2 sprays into the nose daily.   vitamin B-12 1000 MCG tablet Take 1 tablet (1,000 mcg total) by mouth daily.   ferrous gluconate (FERGON) 324 MG tablet TAKE 1 TABLET BY MOUTH TWICE A DAY WITH MEALS (Patient not taking: Reported on 05/02/2022)   No facility-administered encounter medications on file as of 05/02/2022.    Allergies (verified) Patient has no known allergies.   History: Past Medical History:  Diagnosis Date   Allergy    Asthma    CAD (coronary artery disease)    COPD (chronic obstructive pulmonary disease) (HCC)    Depression    GERD (gastroesophageal reflux disease)    Hyperlipidemia    Hypertension    Myocardial infarction ([place]) 10/16/2013   Past Surgical History:  Procedure Laterality Date   CARDIAC CATHETERIZATION  10/17/13   significant 3 vessel disease   LEFT HEART CATHETERIZATION WITH CORONARY ANGIOGRAM N/A 10/17/2013   Procedure: LEFT HEART CATHETERIZATION WITH CORONARY ANGIOGRAM;  Surgeon: Sinclair Grooms, MD;  Location: [facility] CATH LAB;  Service: Cardiovascular;  Laterality: N/A;   Family History  Problem Relation Age of Onset   Allergies Mother    Breast cancer Mother    Arthritis Mother    Cancer Mother    Hyperlipidemia Mother    Hypertension Mother    Heart disease Father    Heart disease Brother    Arthritis Brother    Cancer Brother    Depression Brother    Drug abuse Brother    Hyperlipidemia Brother    Heart attack Brother    Rheum arthritis Brother    Cancer Sister    Dementia Sister    Social History   Socioeconomic History   Marital status: Widowed    Spouse name: Not on file   Number of children:  Not on file   Years of education: Not on file   Highest education level: Not on file  Occupational History   Not on file  Tobacco Use   Smoking status: Former    Packs/day: 1.00    Years: 45.00    Total pack years: 45.00    Types: Cigarettes    Quit date: 10/16/2013    Years since quitting: 8.5   Smokeless tobacco: Never  Vaping Use   Vaping Use: Never used  Substance and Sexual Activity   Alcohol use: Not Currently    Comment: occasional   Drug use: No   Sexual activity: Not Currently  Other Topics Concern   Not on file  Social History Narrative   Not on file   Social Determinants of Health  Financial Resource Strain: Low Risk  (05/02/2022)   Overall Financial Resource Strain (CARDIA)    Difficulty of Paying Living Expenses: Not hard at all  Food Insecurity: No Food Insecurity (05/02/2022)   Hunger Vital Sign    Worried About Running Out of Food in the Last Year: Never true    Ran Out of Food in the Last Year: Never true  Transportation Needs: No Transportation Needs (05/02/2022)   PRAPARE - Hydrologist (Medical): No    Lack of Transportation (Non-Medical): No  Physical Activity: Inactive (05/02/2022)   Exercise Vital Sign    Days of Exercise per Week: 0 days    Minutes of Exercise per Session: 0 min  Stress: No Stress Concern Present (05/02/2022)   [place]    Feeling of Stress : Not at all  Social Connections: Socially Isolated (04/29/2021)   Social Connection and Isolation Panel [NHANES]    Frequency of Communication with Friends and Family: Twice a week    Frequency of Social Gatherings with Friends and Family: Twice a week    Attends Religious Services: Never    Marine scientist or Organizations: No    Attends Archivist Meetings: Never    Marital Status: Widowed    Tobacco Counseling Counseling given: Not Answered   Clinical Intake:  Pre-visit  preparation completed: Yes  Pain : No/denies pain     Nutritional Status: BMI > 30  Obese Nutritional Risks: None Diabetes: Yes  How often do you need to have someone help you when you read instructions, pamphlets, or other written materials from your doctor or pharmacy?: 1 - Never 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 Yes Nutrition Risk Assessment:  Has the patient had any N/V/D within the last 2 months?  No  Does the patient have any non-healing wounds?  No  Has the patient had any unintentional weight loss or weight gain?  No   Diabetes:  Is the patient diabetic?  Yes  If diabetic, was a CBG obtained today?  No  Did the patient bring in their glucometer from home?  No  How often do you monitor your CBG's? 2-3 daily.   Financial Strains and Diabetes Management:  Are you having any financial strains with the device, your supplies or your medication? No .  Does the patient want to be seen by Chronic Care Management for management of their diabetes?  No  Would the patient like to be referred to a Nutritionist or for Diabetic Management?  No   Diabetic Exams:  Diabetic Eye Exam: Overdue for diabetic eye exam. Pt has been advised about the importance in completing this exam. Patient advised to call and schedule an eye exam. Diabetic Foot Exam: Completed 03/15/2022   Interpreter Needed?: No  Information entered by :: NAllen LPN   Activities of Daily Living    05/02/2022   11:44 AM 01/14/2022   10:15 PM  In your present state of health, do you have any difficulty performing the following activities:  Hearing? 1 0  Vision? 0 0  Difficulty concentrating or making decisions? 0 0  Walking or climbing stairs? 0 0  Dressing or bathing? 0 0  Doing errands, shopping? 1   Preparing Food and eating ? N   Using the Toilet? N   In the past six months, have you accidently leaked urine? Y   Do  you have problems with loss of bowel  control? Y   Managing your Medications? N   Managing your Finances? N   Housekeeping or managing your Housekeeping? N     Patient Care Team: [place], Betty G, MD as PCP - General (Family Medicine) Croitoru, Dani Gobble, MD as PCP - Cardiology (Cardiology)  Indicate any recent Medical Services you may have received from other than [facility] providers in the past year (date may be approximate).     Assessment:   This is a routine wellness examination for Maddilynn.  Hearing/Vision screen Vision Screening - Comments:: No regular eye exams  Dietary issues and exercise activities discussed: Current Exercise Habits: The patient does not participate in regular exercise at present   Goals Addressed             This Visit's Progress    Patient Stated       05/02/2022, wants to get blood sugar and BP down       Depression Screen    05/02/2022   11:43 AM 03/15/2022   12:59 PM 04/29/2021    1:16 PM 04/29/2021    1:12 PM  PHQ 2/9 Scores  PHQ - 2 Score 0 0 0 0    Fall Risk    05/02/2022   11:43 AM 04/29/2021    1:15 PM  Fall Risk   Falls in the past year? 1 0  Comment lost balance   Number falls in past yr: 0 0  Injury with Fall? 0 0  Comment  uses walker  Risk for fall due to : Impaired balance/gait;Impaired mobility;Medication side effect Impaired balance/gait  Follow up Falls evaluation completed;Education provided;Falls prevention discussed Falls evaluation completed;Education provided    FALL RISK PREVENTION PERTAINING TO THE HOME:  Any stairs in or around the home? Yes  If so, are there any without handrails? No  Home free of loose throw rugs in walkways, pet beds, electrical cords, etc? Yes  Adequate lighting in your home to reduce risk of falls? Yes   ASSISTIVE DEVICES UTILIZED TO PREVENT FALLS:  Life alert? No  Use of a cane, walker or w/c? Yes  Grab bars in the bathroom? No  Shower chair or bench in shower? Yes  Elevated toilet seat or a handicapped toilet? No   TIMED  UP AND GO:  Was the test performed? No .  .     Cognitive Function:        05/02/2022   11:46 AM  6CIT Screen  What Year? 0 points  What month? 0 points  What time? 0 points  Count back from 20 0 points  Months in reverse 0 points  Repeat phrase 0 points  Total Score 0 points    Immunizations Immunization History  Administered Date(s) Administered   Influenza Split 05/06/2013   Influenza-Unspecified 05/25/2018   PNEUMOCOCCAL CONJUGATE-20 03/15/2022   Pneumococcal Polysaccharide-23 11/27/2017    TDAP status: Due, Education has been provided regarding the importance of this vaccine. Advised may receive this vaccine at local pharmacy or Health Dept. Aware to provide a copy of the vaccination record if obtained from local pharmacy or Health Dept. Verbalized acceptance and understanding.  Flu Vaccine status: Due, Education has been provided regarding the importance of this vaccine. Advised may receive this vaccine at local pharmacy or Health Dept. Aware to provide a copy of the vaccination record if obtained from local pharmacy or Health Dept. Verbalized acceptance and understanding.  Pneumococcal vaccine status: Up to date  Covid-19 vaccine  status: Information provided on how to obtain vaccines.   Qualifies for Shingles Vaccine? Yes   Zostavax completed No   Shingrix Completed?: No.    Education has been provided regarding the importance of this vaccine. Patient has been advised to call insurance company to determine out of pocket expense if they have not yet received this vaccine. Advised may also receive vaccine at local pharmacy or Health Dept. Verbalized acceptance and understanding.  Screening Tests Health Maintenance  Topic Date Due   COVID-19 Vaccine (1) Never done   OPHTHALMOLOGY EXAM  Never done   MAMMOGRAM  Never done   Zoster Vaccines- Shingrix (1 of 2) Never done   DEXA SCAN  Never done   INFLUENZA VACCINE  02/22/2022   COLONOSCOPY (Pts 45-75yr Insurance  coverage will need to be confirmed)  03/16/2023 (Originally 04/22/1993)   HEMOGLOBIN A1C  07/17/2022   Diabetic kidney evaluation - Urine ACR  09/17/2022   Diabetic kidney evaluation - GFR measurement  03/16/2023   FOOT EXAM  03/16/2023   Pneumonia Vaccine 76 Years old  Completed   Hepatitis C Screening  Completed   HPV VACCINES  Aged Out   TETANUS/TDAP  Discontinued    Health Maintenance  Health Maintenance Due  Topic Date Due   COVID-19 Vaccine (1) Never done   OPHTHALMOLOGY EXAM  Never done   MAMMOGRAM  Never done   Zoster Vaccines- Shingrix (1 of 2) Never done   DEXA SCAN  Never done   INFLUENZA VACCINE  02/22/2022    Colorectal cancer screening: decline   Mammogram status: decline  Bone Density status: due  Lung Cancer Screening: (Low Dose CT Chest recommended if Age 16-80 years, 30 pack-year currently smoking OR have quit w/in 15years.) does not qualify.   Lung Cancer Screening Referral: no  Additional Screening:  Hepatitis C Screening: does qualify; Completed 04/16/2020  Vision Screening: Recommended annual ophthalmology exams for early detection of glaucoma and other disorders of the eye. Is the patient up to date with their annual eye exam?  No  Who is the provider or what is the name of the office in which the patient attends annual eye exams? none If pt is not established with a provider, would they like to be referred to a provider to establish care? No .   Dental Screening: Recommended annual dental exams for proper oral hygiene  Community Resource Referral / Chronic Care Management: CRR required this visit?  No   CCM required this visit?  No      Plan:     I have personally reviewed and noted the following in the patient's chart:   Medical and social history Use of alcohol, tobacco or illicit drugs  Current medications and supplements including opioid prescriptions. Patient is not currently taking opioid prescriptions. Functional ability and  status Nutritional status Physical activity Advanced directives List of other physicians Hospitalizations, surgeries, and ER visits in previous 12 months Vitals Screenings to include cognitive, depression, and falls Referrals and appointments  In addition, I have reviewed and discussed with patient certain preventive protocols, quality metrics, and best practice recommendations. A written personalized care plan for preventive services as well as general preventive health recommendations were provided to patient.     NKellie Simmering LPN   [id number]  Nurse Notes: none  Due to this being a virtual visit, the after visit summary with patients personalized plan was offered to patient via mail or my-chart.  to pick up at office at next  visit

## 2022-05-02 NOTE — Patient Instructions (Signed)
Beverly Wheeler , Thank you for taking time to come for your Medicare Wellness Visit. I appreciate your ongoing commitment to your health goals. Please review the following plan we discussed and let me know if I can assist you in the future.   Screening recommendations/referrals: Colonoscopy: decline Mammogram: decline Bone Density: due Recommended yearly ophthalmology/optometry visit for glaucoma screening and checkup Recommended yearly dental visit for hygiene and checkup  Vaccinations: Influenza vaccine: due Pneumococcal vaccine: completed 03/15/2022 Tdap vaccine: n/a Shingles vaccine: discussed    Covid-19: discussed  Advanced directives:  Advance directive discussed with you today.  Conditions/risks identified: none  Next appointment: Follow up in one year for your annual wellness visit    Preventive Care 65 Years and Older, Female Preventive care refers to lifestyle choices and visits with your health care provider that can promote health and wellness. What does preventive care include? A yearly physical exam. This is also called an annual well check. Dental exams once or twice a year. Routine eye exams. Ask your health care provider how often you should have your eyes checked. Personal lifestyle choices, including: Daily care of your teeth and gums. Regular physical activity. Eating a healthy diet. Avoiding tobacco and drug use. Limiting alcohol use. Practicing safe sex. Taking low-dose aspirin every day. Taking vitamin and mineral supplements as recommended by your health care provider. What happens during an annual well check? The services and screenings done by your health care provider during your annual well check will depend on your age, overall health, lifestyle risk factors, and family history of disease. Counseling  Your health care provider may ask you questions about your: Alcohol use. Tobacco use. Drug use. Emotional well-being. Home and relationship  well-being. Sexual activity. Eating habits. History of falls. Memory and ability to understand (cognition). Work and work Statistician. Reproductive health. Screening  You may have the following tests or measurements: Height, weight, and BMI. Blood pressure. Lipid and cholesterol levels. These may be checked every 5 years, or more frequently if you are over 74 years old. Skin check. Lung cancer screening. You may have this screening every year starting at age 74 if you have a 30-pack-year history of smoking and currently smoke or have quit within the past 15 years. Fecal occult blood test (FOBT) of the stool. You may have this test every year starting at age 74. Flexible sigmoidoscopy or colonoscopy. You may have a sigmoidoscopy every 5 years or a colonoscopy every 10 years starting at age 74. Hepatitis C blood test. Hepatitis B blood test. Sexually transmitted disease (STD) testing. Diabetes screening. This is done by checking your blood sugar (glucose) after you have not eaten for a while (fasting). You may have this done every 1-3 years. Bone density scan. This is done to screen for osteoporosis. You may have this done starting at age 74. Mammogram. This may be done every 1-2 years. Talk to your health care provider about how often you should have regular mammograms. Talk with your health care provider about your test results, treatment options, and if necessary, the need for more tests. Vaccines  Your health care provider may recommend certain vaccines, such as: Influenza vaccine. This is recommended every year. Tetanus, diphtheria, and acellular pertussis (Tdap, Td) vaccine. You may need a Td booster every 10 years. Zoster vaccine. You may need this after age 74. Pneumococcal 13-valent conjugate (PCV13) vaccine. One dose is recommended after age 74. Pneumococcal polysaccharide (PPSV23) vaccine. One dose is recommended after age 74. Talk to your  health care provider about which  screenings and vaccines you need and how often you need them. This information is not intended to replace advice given to you by your health care provider. Make sure you discuss any questions you have with your health care provider. Document Released: 08/07/2015 Document Revised: 03/30/2016 Document Reviewed: 05/12/2015 Elsevier Interactive Patient Education  2017 [employer] Prevention in the Home Falls can cause injuries. They can happen to people of all ages. There are many things you can do to make your home safe and to help prevent falls. What can I do on the outside of my home? Regularly fix the edges of walkways and driveways and fix any cracks. Remove anything that might make you trip as you walk through a door, such as a raised step or threshold. Trim any bushes or trees on the path to your home. Use bright outdoor lighting. Clear any walking paths of anything that might make someone trip, such as rocks or tools. Regularly check to see if handrails are loose or broken. Make sure that both sides of any steps have handrails. Any raised decks and porches should have guardrails on the edges. Have any leaves, snow, or ice cleared regularly. Use sand or salt on walking paths during winter. Clean up any spills in your garage right away. This includes oil or grease spills. What can I do in the bathroom? Use night lights. Install grab bars by the toilet and in the tub and shower. Do not use towel bars as grab bars. Use non-skid mats or decals in the tub or shower. If you need to sit down in the shower, use a plastic, non-slip stool. Keep the floor dry. Clean up any water that spills on the floor as soon as it happens. Remove soap buildup in the tub or shower regularly. Attach bath mats securely with double-sided non-slip rug tape. Do not have throw rugs and other things on the floor that can make you trip. What can I do in the bedroom? Use night lights. Make sure that you have a  light by your bed that is easy to reach. Do not use any sheets or blankets that are too big for your bed. They should not hang down onto the floor. Have a firm chair that has side arms. You can use this for support while you get dressed. Do not have throw rugs and other things on the floor that can make you trip. What can I do in the kitchen? Clean up any spills right away. Avoid walking on wet floors. Keep items that you use a lot in easy-to-reach places. If you need to reach something above you, use a strong step stool that has a grab bar. Keep electrical cords out of the way. Do not use floor polish or wax that makes floors slippery. If you must use wax, use non-skid floor wax. Do not have throw rugs and other things on the floor that can make you trip. What can I do with my stairs? Do not leave any items on the stairs. Make sure that there are handrails on both sides of the stairs and use them. Fix handrails that are broken or loose. Make sure that handrails are as long as the stairways. Check any carpeting to make sure that it is firmly attached to the stairs. Fix any carpet that is loose or worn. Avoid having throw rugs at the top or bottom of the stairs. If you do have throw rugs, attach them  to the floor with carpet tape. Make sure that you have a light switch at the top of the stairs and the bottom of the stairs. If you do not have them, ask someone to add them for you. What else can I do to help prevent falls? Wear shoes that: Do not have high heels. Have rubber bottoms. Are comfortable and fit you well. Are closed at the toe. Do not wear sandals. If you use a stepladder: Make sure that it is fully opened. Do not climb a closed stepladder. Make sure that both sides of the stepladder are locked into place. Ask someone to hold it for you, if possible. Clearly mark and make sure that you can see: Any grab bars or handrails. First and last steps. Where the edge of each step  is. Use tools that help you move around (mobility aids) if they are needed. These include: Canes. Walkers. Scooters. Crutches. Turn on the lights when you go into a dark area. Replace any light bulbs as soon as they burn out. Set up your furniture so you have a clear path. Avoid moving your furniture around. If any of your floors are uneven, fix them. If there are any pets around you, be aware of where they are. Review your medicines with your doctor. Some medicines can make you feel dizzy. This can increase your chance of falling. Ask your doctor what other things that you can do to help prevent falls. This information is not intended to replace advice given to you by your health care provider. Make sure you discuss any questions you have with your health care provider. Document Released: 05/07/2009 Document Revised: 12/17/2015 Document Reviewed: 08/15/2014 Elsevier Interactive Patient Education  2017 [employer].

## 2022-05-09 ENCOUNTER — Other Ambulatory Visit: Payer: Self-pay | Admitting: Family Medicine

## 2022-05-09 DIAGNOSIS — Z122 Encounter for screening for malignant neoplasm of respiratory organs: Secondary | ICD-10-CM

## 2022-06-02 ENCOUNTER — Ambulatory Visit: Payer: Medicare Other | Admitting: Cardiovascular Disease

## 2022-06-03 ENCOUNTER — Ambulatory Visit: Payer: Medicare Other | Admitting: Cardiovascular Disease

## 2022-06-23 ENCOUNTER — Ambulatory Visit: Payer: Medicare Other | Admitting: Cardiovascular Disease

## 2022-06-24 ENCOUNTER — Other Ambulatory Visit: Payer: Self-pay | Admitting: Family Medicine

## 2022-06-24 DIAGNOSIS — J449 Chronic obstructive pulmonary disease, unspecified: Secondary | ICD-10-CM

## 2022-07-09 ENCOUNTER — Other Ambulatory Visit: Payer: Self-pay | Admitting: Family Medicine

## 2022-07-17 ENCOUNTER — Other Ambulatory Visit: Payer: Self-pay | Admitting: Family Medicine

## 2022-07-17 DIAGNOSIS — E1169 Type 2 diabetes mellitus with other specified complication: Secondary | ICD-10-CM

## 2022-07-19 ENCOUNTER — Other Ambulatory Visit: Payer: Self-pay

## 2022-07-19 ENCOUNTER — Other Ambulatory Visit: Payer: Self-pay | Admitting: Family Medicine

## 2022-07-19 DIAGNOSIS — J449 Chronic obstructive pulmonary disease, unspecified: Secondary | ICD-10-CM

## 2022-07-19 MED ORDER — ALBUTEROL SULFATE HFA 108 (90 BASE) MCG/ACT IN AERS: INHALATION_SPRAY | RESPIRATORY_TRACT | 2 refills | Status: DC

## 2022-07-27 ENCOUNTER — Ambulatory Visit: Payer: Medicare Other | Admitting: Cardiovascular Disease

## 2022-09-02 ENCOUNTER — Ambulatory Visit: Payer: Medicare Other | Admitting: Cardiovascular Disease

## 2022-09-26 ENCOUNTER — Other Ambulatory Visit: Payer: Self-pay

## 2022-09-26 ENCOUNTER — Ambulatory Visit: Payer: Medicare Other | Admitting: Cardiovascular Disease

## 2022-09-26 DIAGNOSIS — I10 Essential (primary) hypertension: Secondary | ICD-10-CM

## 2022-09-30 ENCOUNTER — Other Ambulatory Visit: Payer: Self-pay | Admitting: Family Medicine

## 2022-10-13 ENCOUNTER — Other Ambulatory Visit: Payer: Self-pay | Admitting: Family Medicine

## 2022-10-13 DIAGNOSIS — E1169 Type 2 diabetes mellitus with other specified complication: Secondary | ICD-10-CM

## 2022-10-13 DIAGNOSIS — J449 Chronic obstructive pulmonary disease, unspecified: Secondary | ICD-10-CM

## 2022-10-18 ENCOUNTER — Other Ambulatory Visit: Payer: Self-pay

## 2022-10-18 MED ORDER — UMECLIDINIUM BROMIDE 62.5 MCG/ACT IN AEPB: 1.0000 | INHALATION_SPRAY | Freq: Every day | RESPIRATORY_TRACT | 3 refills | Status: DC

## 2022-11-07 ENCOUNTER — Ambulatory Visit: Payer: Medicare Other | Admitting: Cardiovascular Disease

## 2022-11-11 NOTE — Progress Notes (Unsigned)
Care Management & Coordination Services Pharmacy Note  11/11/2022 Name:  Beverly Wheeler MRN:  [id number] DOB:  11-11-1945  Summary: ***  Recommendations/Changes made from today's visit: ***  Follow up plan: ***   Subjective: Beverly Wheeler is an 77 y.o. year old female who is a primary patient of [place], Timoteo Expose, MD.  The care coordination team was consulted for assistance with disease management and care coordination needs.    {CCMTELEPHONEFACETOFACE:21091510} for initial visit.  Recent office visits: ***  Recent consult visits: ***  Hospital visits: {Hospital DC Yes/No:25215}   Objective:  Lab Results  Component Value Date   CREATININE 1.02 03/15/2022   BUN 11 03/15/2022   GFR 54.43 (L) 03/15/2022   GFRNONAA 59 (L) 01/16/2022   GFRAA 60 04/16/2020   NA 142 03/15/2022   K 3.5 03/15/2022   CALCIUM 8.8 03/15/2022   CO2 38 (H) 03/15/2022   GLUCOSE 230 (H) 03/15/2022    Lab Results  Component Value Date/Time   HGBA1C 6.1 (H) 01/15/2022 02:30 PM   HGBA1C 6.4 09/17/2021 08:20 AM   FRUCTOSAMINE 216 09/17/2021 08:20 AM   GFR 54.43 (L) 03/15/2022 12:58 PM   GFR 37.67 (L) 09/17/2021 08:20 AM   MICROALBUR 1.7 09/17/2021 08:20 AM    Last diabetic Eye exam: No results found for: "HMDIABEYEEXA"  Last diabetic Foot exam: No results found for: "HMDIABFOOTEX"   Lab Results  Component Value Date   CHOL 204 (H) 09/17/2021   HDL 31.20 (L) 09/17/2021   LDLCALC 133 (H) 09/17/2021   LDLDIRECT 145.0 08/10/2018   TRIG 200.0 (H) 09/17/2021   CHOLHDL 7 09/17/2021       Latest Ref Rng & Units 01/14/2022    3:29 PM 09/17/2021    8:20 AM 04/16/2020    9:01 AM  Hepatic Function  Total Protein 6.5 - 8.1 g/dL 6.6  7.1  6.4   Albumin 3.5 - 5.0 g/dL 3.4  4.0    AST 15 - 41 U/L 29  14  17    ALT 0 - 44 U/L 15  11  16    Alk Phosphatase 38 - 126 U/L 59  63    Total Bilirubin 0.3 - 1.2 mg/dL 0.5  0.4  0.3     Lab Results  Component Value Date/Time   TSH 1.299  11/23/2017 05:54 AM   TSH 1.37 12/11/2009 07:55 AM   TSH 1.63 05/26/2009 10:07 AM       Latest Ref Rng & Units 03/15/2022   12:58 PM 01/16/2022    1:15 AM 01/15/2022   12:44 AM  CBC  WBC 4.0 - 10.5 K/uL 9.3  7.8  8.2   Hemoglobin 12.0 - 15.0 g/dL 40.9  81.1  91.4   Hematocrit 36.0 - 46.0 % 30.2  30.8  32.3   Platelets 150.0 - 400.0 K/uL 184.0  184  163     Lab Results  Component Value Date/Time   VD25OH 39 12/11/2009 07:15 PM   VITAMINB12 293 11/29/2017 02:48 AM    Clinical ASCVD: Yes  The ASCVD Risk score (Arnett DK, et al., 2019) failed to calculate for the following reasons:   The patient has a prior MI or stroke diagnosis         05/02/2022   11:43 AM 03/15/2022   12:59 PM 04/29/2021    1:16 PM  Depression screen PHQ 2/9  Decreased Interest 0 0 0  Down, Depressed, Hopeless 0 0 0  PHQ - 2 Score 0 0 0  Social History   Tobacco Use  Smoking Status Former   Packs/day: 1.00   Years: 45.00   Additional pack years: 0.00   Total pack years: 45.00   Types: Cigarettes   Quit date: 10/16/2013   Years since quitting: 9.0  Smokeless Tobacco Never   BP Readings from Last 3 Encounters:  03/15/22 120/70  01/16/22 (!) 161/49  09/20/21 136/60   Pulse Readings from Last 3 Encounters:  03/15/22 93  01/16/22 69  08/10/18 78   Wt Readings from Last 3 Encounters:  05/02/22 185 lb (83.9 kg)  03/15/22 188 lb 2 oz (85.3 kg)  01/14/22 182 lb 5.1 oz (82.7 kg)   BMI Readings from Last 3 Encounters:  05/02/22 34.96 kg/m  03/15/22 35.55 kg/m  01/14/22 34.45 kg/m    No Known Allergies  Medications Reviewed Today     Reviewed by Barb Merino, LPN (Licensed Practical Nurse) on 05/02/22 at 1139  Med List Status: <None>   Medication Order Taking? Sig Documenting Provider Last Dose Status Informant  ACCU-CHEK GUIDE test strip [id number] Yes USE TO CHECK BLOOD SUGARS 4 TIMES DAILY [place], Betty G, MD Taking Active   Accu-Chek Softclix Lancets lancets [id number] Yes  Use to check blood sugars 1-2 times daily. Dx:e11.9 [place], Betty G, MD Taking Active Self  albuterol (VENTOLIN HFA) 108 (90 Base) MCG/ACT inhaler [id number] Yes INHALE 1 PUFF EVERY 4 HOURS AS NEEDED FOR WHEEZE OR FOR SHORTNESS OF BREATH  Patient taking differently: Inhale 1 puff into the lungs every 4 (four) hours as needed for wheezing or shortness of breath.   [place], Betty G, MD Taking Active Self  aspirin EC 81 MG EC tablet [id number] Yes Take 1 tablet (81 mg total) by mouth daily. Barrett, Joline Salt, PA-C Taking Active Self  atorvastatin (LIPITOR) 80 MG tablet [id number] Yes TAKE 1 TABLET BY MOUTH EVERY DAY AT 6PM [place], Betty G, MD Taking Active   Blood Glucose Monitoring Suppl (ACCU-CHEK AVIVA PLUS) w/Device KIT [id number] Yes Use to test blood sugars 1-2 times daily. Dx: E11.9 [place], Betty G, MD Taking Active Self  cetirizine (ZYRTEC) 10 MG tablet [id number] Yes Take 10 mg by mouth daily. [provider] Taking Active Self  escitalopram (LEXAPRO) 20 MG tablet [id number] Yes Take 1 tablet (20 mg total) by mouth daily. [place], Betty G, MD Taking Active   ezetimibe (ZETIA) 10 MG tablet [id number] Yes TAKE 1 TABLET BY MOUTH EVERY DAY  Patient taking differently: Take 10 mg by mouth daily.   [place], Betty G, MD Taking Active Self  ferrous gluconate (FERGON) 324 MG tablet [id number] No TAKE 1 TABLET BY MOUTH TWICE A DAY WITH MEALS  Patient not taking: Reported on 05/02/2022   [place], Betty G, MD Not Taking Active Self  furosemide (LASIX) 40 MG tablet [id number] Yes TAKE 1 TABLET BY MOUTH EVERY DAY  Patient taking differently: Take 40 mg by mouth daily.   [place], Betty G, MD Taking Active Self  glipiZIDE (GLUCOTROL) 5 MG tablet [id number] Yes TAKE 1 TABLET BY MOUTH DAILY BEFORE BREAKFAST. 20-30 MINUTES BEFORE MEAL.  Patient taking differently: Take 5 mg by mouth daily before breakfast.   [place], Betty G, MD Taking Active Self  KLOR-CON M20 20 MEQ tablet [id number] Yes TAKE 1 TABLET BY MOUTH  EVERY DAY [place], Betty G, MD Taking Active Self  metFORMIN (GLUCOPHAGE) 500 MG tablet [id number] Yes Take 1 tablet (500 mg total) by mouth 2 (two) times daily with a meal. [place], Betty G, MD Taking Active  mometasone-formoterol (DULERA) 200-5 MCG/ACT AERO [id number] Yes Inhale 2 puffs into the lungs 2 (two) times daily. [place], Betty G, MD Taking Active   montelukast (SINGULAIR) 10 MG tablet [id number] Yes Take 1 tablet (10 mg total) by mouth at bedtime. [place], Betty G, MD Taking Active Self  nebivolol (BYSTOLIC) 5 MG tablet [id number] Yes TAKE 1 TABLET BY MOUTH EVERY DAY [place], Betty G, MD Taking Active   nitroGLYCERIN (NITROSTAT) 0.4 MG SL tablet [id number] Yes Place 1 tablet (0.4 mg total) under the tongue every 5 (five) minutes as needed for chest pain. Croitoru, Mihai, MD Taking Active Self           Med Note Jomarie Longs, Elisabeth Pigeon   Fri Jan 14, 2022  9:03 PM)    ranitidine (ZANTAC) 150 MG tablet [id number] Yes Take 150 mg by mouth 2 (two) times daily. [provider] Taking Active Self  tiotropium (SPIRIVA HANDIHALER) 18 MCG inhalation capsule [id number] Yes INHALE 1 CAPSULE VIA HANDIHALER ONCE DAILY AT THE SAME TIME EVERY DAY [place], Betty G, MD Taking Active   triamcinolone (NASACORT ALLERGY 24HR) 55 MCG/ACT AERO nasal inhaler [id number] Yes Place 2 sprays into the nose daily. [provider] Taking Active Self  vitamin B-12 1000 MCG tablet [id number] Yes Take 1 tablet (1,000 mcg total) by mouth daily. Leroy Sea, MD Taking Active Self            SDOH:  (Social Determinants of Health) assessments and interventions performed: Yes SDOH Interventions    Flowsheet Row Clinical Support from 05/02/2022 in [facility] [place] Clinical Support from 04/29/2021 in [facility] [place]  SDOH Interventions    Food Insecurity Interventions Intervention Not Indicated Intervention Not Indicated  Housing Interventions -- Intervention Not  Indicated  Transportation Interventions Intervention Not Indicated Intervention Not Indicated  Financial Strain Interventions Intervention Not Indicated Intervention Not Indicated  Physical Activity Interventions Patient Refused, Other (Comments) Intervention Not Indicated  Stress Interventions Intervention Not Indicated Intervention Not Indicated  Social Connections Interventions -- Intervention Not Indicated       Medication Assistance: {MEDASSISTANCEINFO:25044}  Medication Access: Within the past 30 days, how often has patient missed a dose of medication? *** Is a pillbox or other method used to improve adherence? {YES/NO:21197} Factors that may affect medication adherence? {CHL DESC; BARRIERS:21522} Are meds synced by current pharmacy? {YES/NO:21197} Are meds delivered by current pharmacy? {YES/NO:21197} Does patient experience delays in picking up medications due to transportation concerns? {YES/NO:21197}  [facility] Services Reviewed: Is patient disadvantaged to use [facility]?: Yes  Current Rx insurance plan: [employer] Name and location of Current pharmacy:  [id number] - [place], [place] - [address] AT [address] OF [facility] [address] [place] [address] Phone: [phone] Fax: [phone]  [facility] services reviewed with patient today?: No  Patient requests to transfer care to [facility] Pharm[facility]?: No  Reason patient declined to change pharmacies: Disadvantaged due to insurance/mail order  Compliance/Adherence/Medication fill history: Care Gaps: ***  Star-Rating Drugs: ***   Assessment/Plan   Hypertension (BP goal <130/80) -{US controlled/uncontrolled:25276} -Current treatment: Lasix  1 qd Nebivolol  1 qd -Medications previously tried: Irbesartan, Valsartan/HCTZ -Current home readings: *** -Current dietary habits: *** -Current exercise habits: *** -{ACTIONS;DENIES/REPORTS:21021675::"Denies"}  hypotensive/hypertensive symptoms -Educated on {CCM BP Counseling:25124} -Counseled to monitor BP at home ***, document, and provide log at future appointments -{CCMPHARMDINTERVENTION:25122}  Hyperlipidemia: (LDL goal < 70) -{US controlled/uncontrolled:25276} -Current treatment: Atorvastatin  1 qd Appropriate, Query Effective Zetia  1  qd Appropriate, Query Effective -Medications previously tried: Fenofibrate  -Current dietary patterns: *** -Current exercise habits: *** -Educated on {CCM HLD Counseling:25126} -{CCMPHARMDINTERVENTION:25122}  Diabetes (A1c goal <7%) -Not ideally controlled -Current medications: Metformin  BID Glipizide  1 qd -Medications previously tried: None  -Current home glucose readings fasting glucose: *** post prandial glucose: *** -{ACTIONS;DENIES/REPORTS:21021675::"Denies"} hypoglycemic/hyperglycemic symptoms -Current meal patterns:  breakfast: ***  lunch: ***  dinner: *** snacks: *** drinks: *** -Current exercise: *** -Educated on {CCM DM COUNSELING:25123} -Counseled to check feet daily and get yearly eye exams -{CCMPHARMDINTERVENTION:25122}  COPD mixed type(Goal: control symptoms and prevent exacerbations) -{US controlled/uncontrolled:25276} -Current treatment  Dulera 200-5 2 puffs BID Incruse 1 qd Montelukast  1 qd Albuterol HFA 1 puff q 4h prn Nasacort 2 sprays into nose daily Zyrtec  1 qd -Medications previously tried: ***  -Gold Grade: Gold 2 (FEV1 50-79%) -Current COPD Classification:  A (low sx, 0-1 moderate exacerbations, no hospitalizations) -MMRC/CAT score: *** -Pulmonary function testing: 01/27/14 58% -Exacerbations requiring treatment in last 6 months: None -Patient {Actions; denies-reports:120008} consistent use of maintenance inhaler -Frequency of rescue inhaler use: *** -Counseled on {CCMINHALERCOUNSELING:25121} -{CCMPHARMDINTERVENTION:25122}  CAD (Goal: Slow progression of atherosclerosis (plaques /  blockages) throughout your body to reduce risk of heart attack and strokes) {US controlled/uncontrolled:25276:::1} Current Medication Therapy: Aspirin  1 qd Nitroglycerin 0.4mg  sl prn Medications Previously Tried: Chest Pain in last 3 months: {YES/NO} Any signs of bleed? Counseled on:  Chronic Kidney Disease Stage 3a  -All medications assessed for renal dosing and appropriateness in chronic kidney disease. -{CCMPHARMDINTERVENTION:25122}  GERD (Goal: minimize symptoms of reflux ) -{US controlled/uncontrolled:25276} -Current treatment  *** -Medications previously tried: none reported  -Triggering factors: *** -Hx of Bleeds/ulcers: {yes/no:20286} -Counseled on small meals, elevating head, and sleeping on left side -{CCMPHARMDINTERVENTION:25122}   Depression/Anxiety (Goal: Well controlled mood that still allows for ADLs) -Not assessed today -Current treatment: Escitalopram  1 qd  Hypokalemia (Goal: K WNL) -Not assessed today -Current treatment  Klor-Con 20 mEq 1 qd  Iron Deficiency Anemia  -Not assessed today -Current treatment  Ferrous gluconate  1 qd  OTC  -Current treatment  Vit B12 1 qd   Sherrill Raring Clinical Pharmacist [phone]

## 2022-11-17 ENCOUNTER — Telehealth: Payer: Self-pay

## 2022-11-17 NOTE — Progress Notes (Signed)
Care Management & Coordination Services Pharmacy Team  Reason for Encounter: Appointment Reminder  Contacted patient to confirm telephone appointment with Delano Metz, PharmD on 11/21/2022 at 10:00. Spoke with patient on 11/17/2022, patient requested appointment to be a telephone appointment and also needed to change the date, appointment is now scheduled for 11/28/2022 at 11:15  Have you seen any other providers since your last visit? **Patient denies  Any changes in your medications or health? Patient denies  Any side effects from any medications? Patient denies  Do you have an symptoms or problems not managed by your medications? Patient denies  Any concerns about your health right now? Patient denies  Has your provider asked that you check blood pressure, blood sugar, or follow special diet at home? Patient tries to follow a low carb/sugar diet, she checks her blood pressure and blood sugar at home.   Do you get any type of exercise on a regular basis? Patient tries to walk outside daily  Can you think of a goal you would like to reach for your health? Patient denies  Do you have any problems getting your medications? Patient denies, occasionally they are out of stock and she may have to wait a day for them to be restocked.   Is there anything that you would like to discuss during the appointment? Patient denies  Please bring medications and supplements to appointment   Chart review:  Recent office visits:  05/02/2022 Elisha Ponder LPN - Encounter for Medicare annual wellness exam   03/15/2022 [name] MD - Patient was seen for Type 2 diabetes mellitus with other specified complication, without long-term current use of insulin  and additional concerns. Changed Dulera 200-5 mcg/act to 2 puffs twice daily. Discontinued fenofibrate and folic acid.   Recent consult visits:  None  Hospital visits:  Admitted to [facility] on 01/14/2022 due to Acute on  chronic respiratory failure with hypoxia and hypercapnia and additional concersn . Discharge date was 01/16/2022.   New?Medications Started at [facility] Discharge:?? amoxicillin-clavulanate  predniSONE Medication Changes at Hospital Discharge: None Medications Discontinued at Hospital Discharge: atorvastatin 80 MG tablet Medications that remain the same after Hospital Discharge:??  -All other medications will remain the same.    Care Gaps: AWV - completed 05/02/2022 Last eye exam - never done Last foot exam - 03/15/2022 Last BP - 120/70 on 03/15/2022 Last A1C - 6.1 on 01/15/2022 Covid  - never done Tdap - never done Lung Cancer Screen - never done Mammogram - never done Shingrix - never done Dexa Scan - never done HGA1C - overdue Urine ACR - overdue Colonoscopy - postponed  Star Rating Drugs:  Lipitor 80 mg - last [facility] (this was d/c at hospital 01/14/2022) Glipizide 5 mg - last filled 10/14/2022 [facility] Metformin 500 mg - last filled 09/18/2022 [facility]  Inetta Fermo [facility]  Clinical Pharmacist Assistant [phone]

## 2022-11-24 NOTE — Progress Notes (Unsigned)
Care Management & Coordination Services Pharmacy Note  11/24/2022 Name:  Janellie Tennison MRN:  [id number] DOB:  1945-09-14  Summary: ***  Recommendations/Changes made from today's visit: ***  Follow up plan: ***   Subjective: Beverly Wheeler is an 77 y.o. year old female who is a primary patient of [place], Timoteo Expose, MD.  The care coordination team was consulted for assistance with disease management and care coordination needs.    {CCMTELEPHONEFACETOFACE:21091510} for initial visit.  Recent office visits: ***  Recent consult visits: ***  Hospital visits: {Hospital DC Yes/No:25215}   Objective:  Lab Results  Component Value Date   CREATININE 1.02 03/15/2022   BUN 11 03/15/2022   GFR 54.43 (L) 03/15/2022   GFRNONAA 59 (L) 01/16/2022   GFRAA 60 04/16/2020   NA 142 03/15/2022   K 3.5 03/15/2022   CALCIUM 8.8 03/15/2022   CO2 38 (H) 03/15/2022   GLUCOSE 230 (H) 03/15/2022    Lab Results  Component Value Date/Time   HGBA1C 6.1 (H) 01/15/2022 02:30 PM   HGBA1C 6.4 09/17/2021 08:20 AM   FRUCTOSAMINE 216 09/17/2021 08:20 AM   GFR 54.43 (L) 03/15/2022 12:58 PM   GFR 37.67 (L) 09/17/2021 08:20 AM   MICROALBUR 1.7 09/17/2021 08:20 AM    Last diabetic Eye exam: No results found for: "HMDIABEYEEXA"  Last diabetic Foot exam: No results found for: "HMDIABFOOTEX"   Lab Results  Component Value Date   CHOL 204 (H) 09/17/2021   HDL 31.20 (L) 09/17/2021   LDLCALC 133 (H) 09/17/2021   LDLDIRECT 145.0 08/10/2018   TRIG 200.0 (H) 09/17/2021   CHOLHDL 7 09/17/2021       Latest Ref Rng & Units 01/14/2022    3:29 PM 09/17/2021    8:20 AM 04/16/2020    9:01 AM  Hepatic Function  Total Protein 6.5 - 8.1 g/dL 6.6  7.1  6.4   Albumin 3.5 - 5.0 g/dL 3.4  4.0    AST 15 - 41 U/L 29  14  17    ALT 0 - 44 U/L 15  11  16    Alk Phosphatase 38 - 126 U/L 59  63    Total Bilirubin 0.3 - 1.2 mg/dL 0.5  0.4  0.3     Lab Results  Component Value Date/Time   TSH 1.299  11/23/2017 05:54 AM   TSH 1.37 12/11/2009 07:55 AM   TSH 1.63 05/26/2009 10:07 AM       Latest Ref Rng & Units 03/15/2022   12:58 PM 01/16/2022    1:15 AM 01/15/2022   12:44 AM  CBC  WBC 4.0 - 10.5 K/uL 9.3  7.8  8.2   Hemoglobin 12.0 - 15.0 g/dL 40.9  81.1  91.4   Hematocrit 36.0 - 46.0 % 30.2  30.8  32.3   Platelets 150.0 - 400.0 K/uL 184.0  184  163     Lab Results  Component Value Date/Time   VD25OH 39 12/11/2009 07:15 PM   VITAMINB12 293 11/29/2017 02:48 AM    Clinical ASCVD: Yes  The ASCVD Risk score (Arnett DK, et al., 2019) failed to calculate for the following reasons:   The patient has a prior MI or stroke diagnosis         05/02/2022   11:43 AM 03/15/2022   12:59 PM 04/29/2021    1:16 PM  Depression screen PHQ 2/9  Decreased Interest 0 0 0  Down, Depressed, Hopeless 0 0 0  PHQ - 2 Score 0 0 0  Social History   Tobacco Use  Smoking Status Former   Packs/day: 1.00   Years: 45.00   Additional pack years: 0.00   Total pack years: 45.00   Types: Cigarettes   Quit date: 10/16/2013   Years since quitting: 9.1  Smokeless Tobacco Never   BP Readings from Last 3 Encounters:  03/15/22 120/70  01/16/22 (!) 161/49  09/20/21 136/60   Pulse Readings from Last 3 Encounters:  03/15/22 93  01/16/22 69  08/10/18 78   Wt Readings from Last 3 Encounters:  05/02/22 185 lb (83.9 kg)  03/15/22 188 lb 2 oz (85.3 kg)  01/14/22 182 lb 5.1 oz (82.7 kg)   BMI Readings from Last 3 Encounters:  05/02/22 34.96 kg/m  03/15/22 35.55 kg/m  01/14/22 34.45 kg/m    No Known Allergies  Medications Reviewed Today     Reviewed by Barb Merino, LPN (Licensed Practical Nurse) on 05/02/22 at 1139  Med List Status: <None>   Medication Order Taking? Sig Documenting Provider Last Dose Status Informant  ACCU-CHEK GUIDE test strip [id number] Yes USE TO CHECK BLOOD SUGARS 4 TIMES DAILY [place], Betty G, MD Taking Active   Accu-Chek Softclix Lancets lancets [id number] Yes  Use to check blood sugars 1-2 times daily. Dx:e11.9 [place], Betty G, MD Taking Active Self  albuterol (VENTOLIN HFA) 108 (90 Base) MCG/ACT inhaler [id number] Yes INHALE 1 PUFF EVERY 4 HOURS AS NEEDED FOR WHEEZE OR FOR SHORTNESS OF BREATH  Patient taking differently: Inhale 1 puff into the lungs every 4 (four) hours as needed for wheezing or shortness of breath.   [place], Betty G, MD Taking Active Self  aspirin EC 81 MG EC tablet [id number] Yes Take 1 tablet (81 mg total) by mouth daily. Barrett, Joline Salt, PA-C Taking Active Self  atorvastatin (LIPITOR) 80 MG tablet [id number] Yes TAKE 1 TABLET BY MOUTH EVERY DAY AT 6PM [place], Betty G, MD Taking Active   Blood Glucose Monitoring Suppl (ACCU-CHEK AVIVA PLUS) w/Device KIT [id number] Yes Use to test blood sugars 1-2 times daily. Dx: E11.9 [place], Betty G, MD Taking Active Self  cetirizine (ZYRTEC) 10 MG tablet [id number] Yes Take 10 mg by mouth daily. [provider] Taking Active Self  escitalopram (LEXAPRO) 20 MG tablet [id number] Yes Take 1 tablet (20 mg total) by mouth daily. [place], Betty G, MD Taking Active   ezetimibe (ZETIA) 10 MG tablet [id number] Yes TAKE 1 TABLET BY MOUTH EVERY DAY  Patient taking differently: Take 10 mg by mouth daily.   [place], Betty G, MD Taking Active Self  ferrous gluconate (FERGON) 324 MG tablet [id number] No TAKE 1 TABLET BY MOUTH TWICE A DAY WITH MEALS  Patient not taking: Reported on 05/02/2022   [place], Betty G, MD Not Taking Active Self  furosemide (LASIX) 40 MG tablet [id number] Yes TAKE 1 TABLET BY MOUTH EVERY DAY  Patient taking differently: Take 40 mg by mouth daily.   [place], Betty G, MD Taking Active Self  glipiZIDE (GLUCOTROL) 5 MG tablet [id number] Yes TAKE 1 TABLET BY MOUTH DAILY BEFORE BREAKFAST. 20-30 MINUTES BEFORE MEAL.  Patient taking differently: Take 5 mg by mouth daily before breakfast.   [place], Betty G, MD Taking Active Self  KLOR-CON M20 20 MEQ tablet [id number] Yes TAKE 1 TABLET BY MOUTH  EVERY DAY [place], Betty G, MD Taking Active Self  metFORMIN (GLUCOPHAGE) 500 MG tablet [id number] Yes Take 1 tablet (500 mg total) by mouth 2 (two) times daily with a meal. [place], Betty G, MD Taking Active  mometasone-formoterol (DULERA) 200-5 MCG/ACT AERO [id number] Yes Inhale 2 puffs into the lungs 2 (two) times daily. [place], Betty G, MD Taking Active   montelukast (SINGULAIR) 10 MG tablet [id number] Yes Take 1 tablet (10 mg total) by mouth at bedtime. [place], Betty G, MD Taking Active Self  nebivolol (BYSTOLIC) 5 MG tablet [id number] Yes TAKE 1 TABLET BY MOUTH EVERY DAY [place], Betty G, MD Taking Active   nitroGLYCERIN (NITROSTAT) 0.4 MG SL tablet [id number] Yes Place 1 tablet (0.4 mg total) under the tongue every 5 (five) minutes as needed for chest pain. Croitoru, Mihai, MD Taking Active Self           Med Note Jomarie Longs, Elisabeth Pigeon   Fri Jan 14, 2022  9:03 PM)    ranitidine (ZANTAC) 150 MG tablet [id number] Yes Take 150 mg by mouth 2 (two) times daily. [provider] Taking Active Self  tiotropium (SPIRIVA HANDIHALER) 18 MCG inhalation capsule [id number] Yes INHALE 1 CAPSULE VIA HANDIHALER ONCE DAILY AT THE SAME TIME EVERY DAY [place], Betty G, MD Taking Active   triamcinolone (NASACORT ALLERGY 24HR) 55 MCG/ACT AERO nasal inhaler [id number] Yes Place 2 sprays into the nose daily. [provider] Taking Active Self  vitamin B-12 1000 MCG tablet [id number] Yes Take 1 tablet (1,000 mcg total) by mouth daily. Leroy Sea, MD Taking Active Self            SDOH:  (Social Determinants of Health) assessments and interventions performed: Yes SDOH Interventions    Flowsheet Row Clinical Support from 05/02/2022 in [facility] [place] Clinical Support from 04/29/2021 in [facility] [place]  SDOH Interventions    Food Insecurity Interventions Intervention Not Indicated Intervention Not Indicated  Housing Interventions -- Intervention Not  Indicated  Transportation Interventions Intervention Not Indicated Intervention Not Indicated  Financial Strain Interventions Intervention Not Indicated Intervention Not Indicated  Physical Activity Interventions Patient Refused, Other (Comments) Intervention Not Indicated  Stress Interventions Intervention Not Indicated Intervention Not Indicated  Social Connections Interventions -- Intervention Not Indicated       Medication Assistance: {MEDASSISTANCEINFO:25044}  Medication Access: Within the past 30 days, how often has patient missed a dose of medication? *** Is a pillbox or other method used to improve adherence? {YES/NO:21197} Factors that may affect medication adherence? {CHL DESC; BARRIERS:21522} Are meds synced by current pharmacy? {YES/NO:21197} Are meds delivered by current pharmacy? {YES/NO:21197} Does patient experience delays in picking up medications due to transportation concerns? {YES/NO:21197}  [facility] Services Reviewed: Is patient disadvantaged to use [facility]?: Yes  Current Rx insurance plan: [employer] Name and location of Current pharmacy:  [id number] [place], [place] - [address] AT [address] [facility] [address] [place] [address] Phone: [phone] Fax: [phone]  [facility] services reviewed with patient today?: No  Patient requests to transfer care to [facility] Pharmacy?: No  Reason patient declined to change pharmacies: Disadvantaged due to insurance/mail order  Compliance/Adherence/Medication fill history: Care Gaps: ***  Star-Rating Drugs: ***   Assessment/Plan   Hypertension (BP goal <130/80) -{US controlled/uncontrolled:25276} -Current treatment: Lasix  1 qd Nebivolol  1 qd -Medications previously tried: Irbesartan, Valsartan/HCTZ -Current home readings: *** -Current dietary habits: *** -Current exercise habits: *** -{ACTIONS;DENIES/REPORTS:21021675::"Denies"}  hypotensive/hypertensive symptoms -Educated on {CCM BP Counseling:25124} -Counseled to monitor BP at home ***, document, and provide log at future appointments -{CCMPHARMDINTERVENTION:25122}  Hyperlipidemia: (LDL goal < 70) -{US controlled/uncontrolled:25276} -Current treatment: Atorvastatin  1 qd Appropriate, Query Effective Zetia  1  qd Appropriate, Query Effective -Medications previously tried: Fenofibrate  -Current dietary patterns: *** -Current exercise habits: *** -Educated on {CCM HLD Counseling:25126} -{CCMPHARMDINTERVENTION:25122}  Diabetes (A1c goal <7%) -Not ideally controlled -Current medications: Metformin 500mg  BID Glipizide 5mg  1 qd -Medications previously tried: None  -Current home glucose readings fasting glucose: *** post prandial glucose: *** -{ACTIONS;DENIES/REPORTS:21021675::"Denies"} hypoglycemic/hyperglycemic symptoms -Current meal patterns:  breakfast: ***  lunch: ***  dinner: *** snacks: *** drinks: *** -Current exercise: *** -Educated on {CCM DM COUNSELING:25123} -Counseled to check feet daily and get yearly eye exams -{CCMPHARMDINTERVENTION:25122}  COPD mixed type(Goal: control symptoms and prevent exacerbations) -{US controlled/uncontrolled:25276} -Current treatment  Dulera 200-5 2 puffs BID Incruse 1 qd Montelukast 10mg  1 qd Albuterol HFA 1 puff q 4h prn Nasacort 2 sprays into nose daily Zyrtec 10mg  1 qd -Medications previously tried: ***  -Gold Grade: Gold 2 (FEV1 50-79%) -Current COPD Classification:  A (low sx, 0-1 moderate exacerbations, no hospitalizations) -MMRC/CAT score: *** -Pulmonary function testing: 01/27/14 58% -Exacerbations requiring treatment in last 6 months: None -Patient {Actions; denies-reports:120008} consistent use of maintenance inhaler -Frequency of rescue inhaler use: *** -Counseled on {CCMINHALERCOUNSELING:25121} -{CCMPHARMDINTERVENTION:25122}  CAD (Goal: Slow progression of atherosclerosis (plaques /  blockages) throughout your body to reduce risk of heart attack and strokes) {US controlled/uncontrolled:25276:::1} Current Medication Therapy: Aspirin 81mg  1 qd Nitroglycerin 0.4mg  sl prn Medications Previously Tried: Chest Pain in last 3 months: {YES/NO} Any signs of bleed? Counseled on:  Chronic Kidney Disease Stage 3a  -All medications assessed for renal dosing and appropriateness in chronic kidney disease. -{CCMPHARMDINTERVENTION:25122}  GERD (Goal: minimize symptoms of reflux ) -{US controlled/uncontrolled:25276} -Current treatment  *** -Medications previously tried: none reported  -Triggering factors: *** -Hx of Bleeds/ulcers: {yes/no:20286} -Counseled on small meals, elevating head, and sleeping on left side -{CCMPHARMDINTERVENTION:25122}   Depression/Anxiety (Goal: Well controlled mood that still allows for ADLs) -Not assessed today -Current treatment: Escitalopram 20mg  1 qd  Hypokalemia (Goal: K WNL) -Not assessed today -Current treatment  Klor-Con 20 mEq 1 qd  Iron Deficiency Anemia  -Not assessed today -Current treatment  Ferrous gluconate 324mg  1 qd  OTC  -Current treatment  Vit B12 1 qd   Sherrill Raring Clinical Pharmacist [phone]

## 2022-11-25 ENCOUNTER — Telehealth: Payer: Self-pay

## 2022-11-25 NOTE — Progress Notes (Signed)
Care Management & Coordination Services Pharmacy Team  Reason for Encounter: Appointment Reminder  Contacted patient to confirm telephone appointment with Delano Metz, PharmD on 11/28/2022 at 11:15. Unsuccessful outreach. Left voicemail for patient to return call.    Care Gaps: AWV - completed 05/02/2022 Last eye exam - never done Last foot exam - 03/15/2022 Last A1C - 6.1 on 01/15/2022 Covid  - never done Tdap - never done Lung Cancer Screen - never done Mammogram - never done Shingrix - never done Dexa Scan - never done [facility] - overdue Urine ACR - overdue Colonoscopy - postponed   Star Rating Drugs:  Lipitor 80 mg - last [facility] ([facility] was d[facility]c [facility] hospital 01/14/2022) Glipizide 5 mg - last filled 10[facility]14/2022 [facility] Metformin 500 mg - last [facility]   Inetta Fermo [facility]  Clinical Pharmacist Assistant [phone]

## 2022-11-26 ENCOUNTER — Other Ambulatory Visit: Payer: Self-pay | Admitting: Family Medicine

## 2022-11-30 ENCOUNTER — Other Ambulatory Visit: Payer: Self-pay | Admitting: Family Medicine

## 2022-11-30 DIAGNOSIS — E1169 Type 2 diabetes mellitus with other specified complication: Secondary | ICD-10-CM

## 2022-12-13 NOTE — Progress Notes (Signed)
Care Management & Coordination Services Pharmacy Note  12/16/2022 Name:  Beverly Wheeler MRN:  [id number] DOB:  1945/11/17  Summary: Past due for appt with PCP (last seen 02/2022) BP at goal <140/90 last OV, reports mostly at goal with home readings LDL not at goal <70 A1C at goal <7, due for recheck  Recommendations/Changes made from today's visit: -Scheduled f/u with PCP for July at patient request -Counseled to check BP daily and keep a log, continue to be mindful of sodium intake -Counseled on medication adherence, patient out of lipitor and zetia, refills requested from PCP -Counseled on low-carb diet and routine BG monitoring, reminded of A1C and sugar goals  Follow up plan: PCP visit in July, due for updated labs, noted for appt DM call in August Pharmacist visit in 4-5 months   Subjective: Beverly Wheeler is an 77 y.o. year old female who is a primary patient of Wheeler, Timoteo Expose, MD.  The care coordination team was consulted for assistance with disease management and care coordination needs.    Engaged with patient by telephone for initial visit. Has medication bottles with her for appt  Recent office visits: 05/02/2022 Beverly Ponder LPN - Encounter for Medicare annual wellness exam    03/15/2022 Beverly Swaziland MD - Patient was seen for Type 2 diabetes mellitus with other specified complication, without long-term current use of insulin  and additional concerns. Changed Dulera 200-5 mcg/act to 2 puffs twice daily. Discontinued fenofibrate and folic acid  Recent consult visits: None  Hospital visits: None in previous 6 months   Objective:  Lab Results  Component Value Date   CREATININE 1.02 03/15/2022   BUN 11 03/15/2022   GFR 54.43 (L) 03/15/2022   GFRNONAA 59 (L) 01/16/2022   GFRAA 60 04/16/2020   NA 142 03/15/2022   K 3.5 03/15/2022   CALCIUM 8.8 03/15/2022   CO2 38 (H) 03/15/2022   GLUCOSE 230 (H) 03/15/2022    Lab Results  Component Value  Date/Time   HGBA1C 6.1 (H) 01/15/2022 02:30 PM   HGBA1C 6.4 09/17/2021 08:20 AM   FRUCTOSAMINE 216 09/17/2021 08:20 AM   GFR 54.43 (L) 03/15/2022 12:58 PM   GFR 37.67 (L) 09/17/2021 08:20 AM   MICROALBUR 1.7 09/17/2021 08:20 AM    Last diabetic Eye exam: No results found for: "HMDIABEYEEXA"  Last diabetic Foot exam: No results found for: "HMDIABFOOTEX"   Lab Results  Component Value Date   CHOL 204 (H) 09/17/2021   HDL 31.20 (L) 09/17/2021   LDLCALC 133 (H) 09/17/2021   LDLDIRECT 145.0 08/10/2018   TRIG 200.0 (H) 09/17/2021   CHOLHDL 7 09/17/2021       Latest Ref Rng & Units 01/14/2022    3:29 PM 09/17/2021    8:20 AM 04/16/2020    9:01 AM  Hepatic Function  Total Protein 6.5 - 8.1 g/dL 6.6  7.1  6.4   Albumin 3.5 - 5.0 g/dL 3.4  4.0    AST 15 - 41 U/L 29  14  17    ALT 0 - 44 U/L 15  11  16    Alk Phosphatase 38 - 126 U/L 59  63    Total Bilirubin 0.3 - 1.2 mg/dL 0.5  0.4  0.3     Lab Results  Component Value Date/Time   TSH 1.299 11/23/2017 05:54 AM   TSH 1.37 12/11/2009 07:55 AM   TSH 1.63 05/26/2009 10:07 AM       Latest Ref Rng & Units 03/15/2022  12:58 PM 01/16/2022    1:15 AM 01/15/2022   12:44 AM  CBC  WBC 4.0 - 10.5 K/uL 9.3  7.8  8.2   Hemoglobin 12.0 - 15.0 g/dL 16.1  09.6  04.5   Hematocrit 36.0 - 46.0 % 30.2  30.8  32.3   Platelets 150.0 - 400.0 K/uL 184.0  184  163     Lab Results  Component Value Date/Time   VD25OH 39 12/11/2009 07:15 PM   VITAMINB12 293 11/29/2017 02:48 AM    Clinical ASCVD: Yes  The ASCVD Risk score (Arnett DK, et al., 2019) failed to calculate for the following reasons:   The patient has a prior MI or stroke diagnosis         05/02/2022   11:43 AM 03/15/2022   12:59 PM 04/29/2021    1:16 PM  Depression screen PHQ 2/9  Decreased Interest 0 0 0  Down, Depressed, Hopeless 0 0 0  PHQ - 2 Score 0 0 0     Social History   Tobacco Use  Smoking Status Former   Packs/day: 1.00   Years: 45.00   Additional pack years:  0.00   Total pack years: 45.00   Types: Cigarettes   Quit date: 10/16/2013   Years since quitting: 9.1  Smokeless Tobacco Never   BP Readings from Last 3 Encounters:  03/15/22 120/70  01/16/22 (!) 161/49  09/20/21 136/60   Pulse Readings from Last 3 Encounters:  03/15/22 93  01/16/22 69  08/10/18 78   Wt Readings from Last 3 Encounters:  05/02/22 185 lb (83.9 kg)  03/15/22 188 lb 2 oz (85.3 kg)  01/14/22 182 lb 5.1 oz (82.7 kg)   BMI Readings from Last 3 Encounters:  05/02/22 34.96 kg/m  03/15/22 35.55 kg/m  01/14/22 34.45 kg/m    No Known Allergies  Medications Reviewed Today     Reviewed by Sherrill Raring, RPH (Pharmacist) on 12/16/22 at 1443  Med List Status: <None>   Medication Order Taking? Sig Documenting Provider Last Dose Status Informant  ACCU-CHEK GUIDE test strip [id number]  USE TO CHECK BLOOD SUGARS 4 TIMES DAILY Wheeler, Beverly G, MD  Active   Accu-Chek Softclix Lancets lancets [id number]  Use to check blood sugars 1-2 times daily. Dx:e11.9 Wheeler, Beverly G, MD  Active Self  albuterol (VENTOLIN HFA) 108 (90 Base) MCG/ACT inhaler [id number] Yes Inhale 1 puff every 4 hours as needed for wheezing or shortness of breath. Wheeler, Beverly G, MD Taking Active   aspirin EC 81 MG EC tablet [id number] Yes Take 1 tablet (81 mg total) by mouth daily. Barrett, Joline Salt, PA-C Taking Active Self  atorvastatin (LIPITOR) 80 MG tablet [id number]  TAKE 1 TABLET BY MOUTH EVERY DAY AT 6PM Wheeler, Beverly G, MD  Active   Blood Glucose Monitoring Suppl (ACCU-CHEK AVIVA PLUS) w/Device KIT [id number]  Use to test blood sugars 1-2 times daily. Dx: E11.9 Wheeler, Beverly G, MD  Active Self  cetirizine (ZYRTEC) 10 MG tablet [id number] Yes Take 10 mg by mouth daily. [provider] Taking Active Self  DULERA 200-5 MCG/ACT Sandrea Matte [id number] Yes INHALE 2 PUFFS INTO THE LUNGS TWICE A DAY Wheeler, Beverly G, MD Taking Active   escitalopram (LEXAPRO) 20 MG tablet [id number] Yes Take 1 tablet (20 mg  total) by mouth daily. Wheeler, Beverly G, MD Taking Active   ezetimibe (ZETIA) 10 MG tablet [id number] Yes TAKE 1 TABLET BY MOUTH EVERY DAY  Patient taking differently: Take 10 mg by mouth daily.  Wheeler, Beverly G, MD Taking Active Self  ferrous gluconate (FERGON) 324 MG tablet [id number] Yes TAKE 1 TABLET BY MOUTH TWICE A DAY WITH MEALS Wheeler, Beverly G, MD Taking Active Self  furosemide (LASIX) 40 MG tablet [id number] Yes TAKE 1 TABLET BY MOUTH EVERY DAY Wheeler, Beverly G, MD Taking Active   glipiZIDE (GLUCOTROL) 5 MG tablet [id number] Yes TAKE 1 TABLET BY MOUTH DAILY BEFORE BREAKFAST. SCHEDULE APPOINTMENT FOR FUTURE REFILLS Wheeler, Beverly G, MD Taking Active   KLOR-CON M20 20 MEQ tablet [id number] Yes TAKE 1 TABLET BY MOUTH EVERY DAY Wheeler, Beverly G, MD Taking Active Self  metFORMIN (GLUCOPHAGE) 500 MG tablet [id number] Yes TAKE 1 TABLET BY MOUTH 2 TIMES DAILY WITH A MEAL. Wheeler, Beverly G, MD Taking Active   montelukast (SINGULAIR) 10 MG tablet [id number] Yes Take 1 tablet (10 mg total) by mouth at bedtime. Wheeler, Beverly G, MD Taking Active Self  nebivolol (BYSTOLIC) 5 MG tablet [id number] Yes TAKE 1 TABLET BY MOUTH EVERY DAY Wheeler, Beverly G, MD Taking Active   nitroGLYCERIN (NITROSTAT) 0.4 MG SL tablet [id number] Yes Place 1 tablet (0.4 mg total) under the tongue every 5 (five) minutes as needed for chest pain. Croitoru, Mihai, MD Taking Active Self           Med Note Jomarie Longs, Elisabeth Pigeon   Fri Jan 14, 2022  9:03 PM)    ranitidine (ZANTAC) 150 MG tablet [id number]  Take 150 mg by mouth 2 (two) times daily. [provider]  Active Self  triamcinolone (NASACORT ALLERGY 24HR) 55 MCG/ACT AERO nasal inhaler [id number] Yes Place 2 sprays into the nose daily. [provider] Taking Active Self  umeclidinium bromide (INCRUSE ELLIPTA) 62.5 MCG/ACT AEPB [id number] Yes Inhale 1 puff into the lungs daily. Wheeler, Beverly G, MD Taking Active   vitamin B-12 1000 MCG tablet [id number] Yes Take 1 tablet (1,000 mcg  total) by mouth daily. Leroy Sea, MD Taking Active Self            SDOH:  (Social Determinants of Health) assessments and interventions performed: Yes SDOH Interventions    Flowsheet Row Care Coordination from 12/16/2022 in [facility] Clinical Support from 05/02/2022 in [facility] Clinical Support from 04/29/2021 in [facility] [place]  SDOH Interventions     Food Insecurity Interventions Intervention Not Indicated Intervention Not Indicated Intervention Not Indicated  Housing Interventions -- -- Intervention Not Indicated  Transportation Interventions -- Intervention Not Indicated Intervention Not Indicated  Financial Strain Interventions -- Intervention Not Indicated Intervention Not Indicated  Physical Activity Interventions -- Patient Refused, Other (Comments) Intervention Not Indicated  Stress Interventions -- Intervention Not Indicated Intervention Not Indicated  Social Connections Interventions -- -- Intervention Not Indicated       Medication Assistance: None required.  Patient affirms current coverage meets needs.  Medication Access: Within the past 30 days, how often has patient missed a dose of medication? Reports none but fill dates suggest otherwise Is a pillbox or other method used to improve adherence? Yes  Factors that may affect medication adherence? nonadherence to medications and pill burden Are meds synced by current pharmacy? No  Are meds delivered by current pharmacy? No  Does patient experience delays in picking up medications due to transportation concerns? No    Compliance/Adherence/Medication fill history: Care Gaps: AWV - completed 05/02/2022 Last eye exam - never done Last foot exam - 03/15/2022 Last BP - 120/70 on 03/15/2022 Last A1C - 6.1 on 01/15/2022  Covid  - never done Tdap - never done Lung Cancer Screen - never done Mammogram - never done Shingrix - never  done Dexa Scan - never done [facility] - overdue Urine ACR - overdue Colonoscopy - postponed  Star-Rating Drugs: Atorvastatin 80mg  PDC 02/21/22 - stopped at hospital discharge in June Metformin 500mg  PDC 95% LF 09/18/22 90DS Glipizide 5mg  PDC 100% LF 10/14/22 90 DS   Assessment/Plan Hypertension (BP goal <130/80) -Controlled -Current treatment: Lasix 40mg  1 qd Appropriate, Effective, Safe, Accessible Nebivolol 5mg  1 qd Appropriate, Effective, Safe, Accessible -Medications previously tried: Irbesartan, Valsartan/HCTZ -Current home readings: 1-2x/day 152/67 this morning (had not taken her medications yet), 141/63 -Current dietary habits: mindful of salt intake -Current exercise habits: Reports none, admits to being very sedentary -Denies hypotensive/hypertensive symptoms -Educated on BP goals and benefits of medications for prevention of heart attack, stroke and kidney damage; Daily salt intake goal < 2300 mg; Importance of home blood pressure monitoring; Proper BP monitoring technique; -Counseled to monitor BP at home daily, document, and provide log at future appointments -Counseled on diet and exercise extensively Recommended to continue current medication  Hyperlipidemia: (LDL goal < 70) -Uncontrolled -Current treatment: Atorvastatin 80mg  1 qd Appropriate, Query Effective Zetia 10mg  1 qd Appropriate, Query Effective -Medications previously tried: Fenofibrate  -Current dietary patterns: admits to eating poorly as of recent and snacking quite a bit -Current exercise habits: see above -Educated on Cholesterol goals;  Benefits of statin for ASCVD risk reduction; Importance of limiting foods high in cholesterol; Exercise goal of 150 minutes per week; -Counseled on diet and exercise extensively Recommended to continue current medication  Diabetes (A1c goal <7%) -Controlled -Current medications: Metformin 500mg  BID Appropriate, Effective, Safe, Accessible Glipizide 5mg  1 qd  Appropriate, Effective, Safe, Accessible -Medications previously tried: None  -Current home glucose readings fasting glucose: 140 this morning post prandial glucose: 117 yesterday evening -Denies hypoglycemic/hyperglycemic symptoms -Current meal patterns:  snacks: admits to snacks late at night drinks: sweet tea with sugar -Current exercise: see above -Educated on A1c and blood sugar goals; Complications of diabetes including kidney damage, retinal damage, and cardiovascular disease; Exercise goal of 150 minutes per week; Benefits of routine self-monitoring of blood sugar; Carbohydrate counting and/or plate method -Counseled to check feet daily and get yearly eye exams -Recommended to continue current medication Recommended updated A1C at next PCP visit  COPD mixed type(Goal: control symptoms and prevent exacerbations) -Not assessed -Current treatment  Dulera 200-5 2 puffs BID Appropriate, Effective, Safe, Accessible Albuterol HFA 1 puff q 4h prn Appropriate, Effective, Safe, Accessible Nasacort 2 sprays into nose daily Appropriate, Effective, Safe, Accessible Zyrtec 10mg  1 qd Appropriate, Effective, Safe, Accessible   CAD (Goal: Slow progression of atherosclerosis (plaques / blockages) throughout your body to reduce risk of heart attack and strokes) -Not assessed today Current Medication Therapy: Aspirin 81mg  1 qd Appropriate, Effective, Safe, Accessible Nitroglycerin 0.4mg  sl prn Appropriate, Effective, Safe, Accessible   Depression/Anxiety (Goal: Well controlled mood that still allows for ADLs) -Not assessed today -Current treatment: Escitalopram 20mg  1 qd Appropriate, Effective, Safe, Accessible  Hypokalemia (Goal: K WNL) -Not assessed today -Current treatment  Klor-Con 20 mEq 1 qd Appropriate, Effective, Safe, Accessible  Iron Deficiency Anemia  -Not assessed today -Current treatment  Ferrous gluconate 324mg  1 qd Appropriate, Effective, Safe, Accessible  OTC   -Current treatment  Vit B12 1 qd Appropriate, Effective, Safe, Accessible   Sherrill Raring Clinical Pharmacist [phone]

## 2022-12-16 ENCOUNTER — Ambulatory Visit: Payer: Medicare Other

## 2022-12-16 ENCOUNTER — Other Ambulatory Visit: Payer: Self-pay

## 2022-12-16 MED ORDER — EZETIMIBE 10 MG PO TABS: 10.0000 mg | ORAL_TABLET | Freq: Every day | ORAL | 0 refills | Status: DC

## 2022-12-16 MED ORDER — ATORVASTATIN CALCIUM 80 MG PO TABS: ORAL_TABLET | ORAL | 0 refills | Status: DC

## 2022-12-16 NOTE — Telephone Encounter (Signed)
-----   Message from Sherrill Raring, [facility] sent at 12/16/2022  2:47 PM EDT ----- Regarding: Med Refills Pt requested refills on the following medications during her visit today:  Atorvastatin 80mg  1 tab once daily Ezetimibe 10mg  1 tab once daily Nitroglycerin 0.4mg  SL prn  Has not been seen in office since August. Sch f/u visit with PCP for July 24th at pt request (wanted to wait until after she sees new cardio doctor on 7/12).  Thank you, Sherrill Raring Clinical Pharmacist [phone]

## 2022-12-20 MED ORDER — NITROGLYCERIN 0.4 MG SL SUBL: 0.4000 mg | SUBLINGUAL_TABLET | SUBLINGUAL | 0 refills | Status: AC | PRN

## 2023-01-22 ENCOUNTER — Other Ambulatory Visit: Payer: Self-pay | Admitting: Family Medicine

## 2023-01-22 DIAGNOSIS — J449 Chronic obstructive pulmonary disease, unspecified: Secondary | ICD-10-CM

## 2023-02-03 ENCOUNTER — Ambulatory Visit: Payer: Medicare Other | Admitting: Cardiovascular Disease

## 2023-02-08 DIAGNOSIS — J449 Chronic obstructive pulmonary disease, unspecified: Secondary | ICD-10-CM | POA: Diagnosis not present

## 2023-02-08 DIAGNOSIS — R269 Unspecified abnormalities of gait and mobility: Secondary | ICD-10-CM | POA: Diagnosis not present

## 2023-02-08 DIAGNOSIS — M6281 Muscle weakness (generalized): Secondary | ICD-10-CM | POA: Diagnosis not present

## 2023-02-15 ENCOUNTER — Ambulatory Visit: Payer: Medicare Other | Admitting: Family Medicine

## 2023-02-21 ENCOUNTER — Ambulatory Visit: Payer: Medicare Other | Admitting: Family Medicine

## 2023-03-01 ENCOUNTER — Ambulatory Visit: Payer: Medicare Other | Admitting: Family Medicine

## 2023-03-11 DIAGNOSIS — J449 Chronic obstructive pulmonary disease, unspecified: Secondary | ICD-10-CM | POA: Diagnosis not present

## 2023-03-11 DIAGNOSIS — M6281 Muscle weakness (generalized): Secondary | ICD-10-CM | POA: Diagnosis not present

## 2023-03-11 DIAGNOSIS — R269 Unspecified abnormalities of gait and mobility: Secondary | ICD-10-CM | POA: Diagnosis not present

## 2023-03-15 NOTE — Progress Notes (Deleted)
HPI: [name] is a 77 y.o. female, who is here today for chronic disease management.  Last seen on 03/15/22.  Diabetes Mellitus II:  - Checking BG at home: *** - Medications: *** - Compliance: *** - Diet: *** - Exercise: *** - eye exam: *** - foot exam: *** - microalbumin: *** - Negative for symptoms of hypoglycemia, polyuria, polydipsia, numbness extremities, foot ulcers/trauma  Lab Results  Component Value Date   HGBA1C 6.1 (H) 01/15/2022   Lab Results  Component Value Date   MICROALBUR 1.7 09/17/2021    Review of Systems See other pertinent positives and negatives in HPI.  Current Outpatient Medications on File Prior to Visit  Medication Sig Dispense Refill   ACCU-CHEK GUIDE test strip USE TO CHECK BLOOD SUGARS 4 TIMES DAILY 100 strip 1   Accu-Chek Softclix Lancets lancets Use to check blood sugars 1-2 times daily. Dx:e11.9 100 each 12   albuterol (VENTOLIN HFA) 108 (90 Base) MCG/ACT inhaler Inhale 1 puff every 4 hours as needed for wheezing or shortness of breath. 18 each 2   aspirin EC 81 MG EC tablet Take 1 tablet (81 mg total) by mouth daily.     atorvastatin (LIPITOR) 80 MG tablet TAKE 1 TABLET BY MOUTH EVERY DAY AT 6PM 90 tablet 0   Blood Glucose Monitoring Suppl (ACCU-CHEK AVIVA PLUS) w/Device KIT Use to test blood sugars 1-2 times daily. Dx: E11.9 1 kit 0   cetirizine (ZYRTEC) 10 MG tablet Take 10 mg by mouth daily.     DULERA 200-5 MCG/ACT AERO INHALE 2 PUFFS INTO THE LUNGS TWICE A DAY 13 each 2   escitalopram (LEXAPRO) 20 MG tablet Take 1 tablet (20 mg total) by mouth daily. 90 tablet 2   ezetimibe (ZETIA) 10 MG tablet Take 1 tablet (10 mg total) by mouth daily. 90 tablet 0   ferrous gluconate (FERGON) 324 MG tablet TAKE 1 TABLET BY MOUTH TWICE A DAY WITH MEALS 180 tablet 1   furosemide (LASIX) 40 MG tablet TAKE 1 TABLET BY MOUTH EVERY DAY 90 tablet 1   glipiZIDE (GLUCOTROL) 5 MG tablet TAKE 1 TABLET BY MOUTH DAILY BEFORE BREAKFAST. SCHEDULE  APPOINTMENT FOR FUTURE REFILLS 90 tablet 0   KLOR-CON M20 20 MEQ tablet TAKE 1 TABLET BY MOUTH EVERY DAY 90 tablet 0   metFORMIN (GLUCOPHAGE) 500 MG tablet TAKE 1 TABLET BY MOUTH 2 TIMES DAILY WITH A MEAL. 180 tablet 1   montelukast (SINGULAIR) 10 MG tablet Take 1 tablet (10 mg total) by mouth at bedtime. 30 tablet 3   nebivolol (BYSTOLIC) 5 MG tablet TAKE 1 TABLET BY MOUTH EVERY DAY 90 tablet 1   nitroGLYCERIN (NITROSTAT) 0.4 MG SL tablet Place 1 tablet (0.4 mg total) under the tongue every 5 (five) minutes as needed for chest pain. 25 tablet 0   triamcinolone (NASACORT ALLERGY 24HR) 55 MCG/ACT AERO nasal inhaler Place 2 sprays into the nose daily.     vitamin B-12 1000 MCG tablet Take 1 tablet (1,000 mcg total) by mouth daily.     No current facility-administered medications on file prior to visit.    Past Medical History:  Diagnosis Date   Allergy    Asthma    CAD (coronary artery disease)    COPD (chronic obstructive pulmonary disease) (HCC)    Depression    GERD (gastroesophageal reflux disease)    Hyperlipidemia    Hypertension    Myocardial infarction (HCC) 10/16/2013   No Known Allergies  Social  History   Socioeconomic History   Marital status: Widowed    Spouse name: Not on file   Number of children: Not on file   Years of education: Not on file   Highest education level: Not on file  Occupational History   Not on file  Tobacco Use   Smoking status: Former    Current packs/day: 0.00    Average packs/day: 1 pack/day for 45.0 years (45.0 ttl pk-yrs)    Types: Cigarettes    Start date: 10/16/1968    Quit date: 10/16/2013    Years since quitting: 9.4   Smokeless tobacco: Never  Vaping Use   Vaping status: Never Used  Substance and Sexual Activity   Alcohol use: Not Currently    Comment: occasional   Drug use: No   Sexual activity: Not Currently  Other Topics Concern   Not on file  Social History Narrative   Not on file   Social Determinants of Health    Financial Resource Strain: Low Risk  (05/02/2022)   Overall Financial Resource Strain (CARDIA)    Difficulty of Paying Living Expenses: Not hard at all  Food Insecurity: No Food Insecurity (12/16/2022)   Hunger Vital Sign    Worried About Running Out of Food in the Last Year: Never true    Ran Out of Food in the Last Year: Never true  Transportation Needs: No Transportation Needs (05/02/2022)   PRAPARE - Administrator, Civil Service (Medical): No    Lack of Transportation (Non-Medical): No  Physical Activity: Inactive (05/02/2022)   Exercise Vital Sign    Days of Exercise per Week: 0 days    Minutes of Exercise per Session: 0 min  Stress: No Stress Concern Present (05/02/2022)   [employer] of Occupational Health - Occupational Stress Questionnaire    Feeling of Stress : Not at all  Social Connections: Socially Isolated (04/29/2021)   Social Connection and Isolation Panel [NHANES]    Frequency of Communication with Friends and Family: Twice a week    Frequency of Social Gatherings with Friends and Family: Twice a week    Attends Religious Services: Never    Database administrator or Organizations: No    Attends Banker Meetings: Never    Marital Status: Widowed    There were no vitals filed for this visit. There is no height or weight on file to calculate BMI.  Physical Exam  ASSESSMENT AND PLAN:  There are no diagnoses linked to this encounter.  No orders of the defined types were placed in this encounter.   No problem-specific Assessment & Plan notes found for this encounter.   No follow-ups on file.  Betty G. Swaziland, MD  [facility]. [place] office.

## 2023-03-17 ENCOUNTER — Ambulatory Visit: Payer: Medicare Other | Admitting: Family Medicine

## 2023-03-17 DIAGNOSIS — I10 Essential (primary) hypertension: Secondary | ICD-10-CM

## 2023-03-17 DIAGNOSIS — N1831 Chronic kidney disease, stage 3a: Secondary | ICD-10-CM

## 2023-03-17 DIAGNOSIS — E1169 Type 2 diabetes mellitus with other specified complication: Secondary | ICD-10-CM

## 2023-03-24 ENCOUNTER — Other Ambulatory Visit: Payer: Self-pay | Admitting: Family Medicine

## 2023-03-24 DIAGNOSIS — F419 Anxiety disorder, unspecified: Secondary | ICD-10-CM

## 2023-03-25 ENCOUNTER — Other Ambulatory Visit: Payer: Self-pay | Admitting: Family Medicine

## 2023-04-05 NOTE — Progress Notes (Deleted)
HPI: Beverly Wheeler is a 77 y.o. female, who is here today for chronic disease management.  Last seen on 03/15/22.  Diabetes Mellitus II:  - Checking BG at home: *** - Medications: *** - Compliance: *** - Diet: *** - Exercise: *** - eye exam: *** - foot exam: *** - microalbumin: *** - Negative for symptoms of hypoglycemia, polyuria, polydipsia, numbness extremities, foot ulcers/trauma  Lab Results  Component Value Date   HGBA1C 6.1 (H) 01/15/2022   Lab Results  Component Value Date   MICROALBUR 1.7 09/17/2021    Review of Systems See other pertinent positives and negatives in HPI.  Current Outpatient Medications on File Prior to Visit  Medication Sig Dispense Refill  . ACCU-CHEK GUIDE test strip USE TO CHECK BLOOD SUGARS 4 TIMES DAILY 100 strip 1  . Accu-Chek Softclix Lancets lancets Use to check blood sugars 1-2 times daily. Dx:e11.9 100 each 12  . albuterol (VENTOLIN HFA) 108 (90 Base) MCG/ACT inhaler Inhale 1 puff every 4 hours as needed for wheezing or shortness of breath. 18 each 2  . aspirin EC 81 MG EC tablet Take 1 tablet (81 mg total) by mouth daily.    [name] atorvastatin (LIPITOR) 80 MG tablet TAKE 1 TABLET BY MOUTH EVERY DAY AT 6PM 90 tablet 0  . Blood Glucose Monitoring Suppl (ACCU-CHEK AVIVA PLUS) w/Device KIT Use to test blood sugars 1-2 times daily. Dx: E11.9 1 kit 0  . cetirizine (ZYRTEC) 10 MG tablet Take 10 mg by mouth daily.    . DULERA 200-5 MCG/ACT AERO INHALE 2 PUFFS INTO THE LUNGS TWICE A DAY 13 each 2  . escitalopram (LEXAPRO) 20 MG tablet TAKE 1 TABLET BY MOUTH EVERY DAY 90 tablet 0  . ezetimibe (ZETIA) 10 MG tablet TAKE 1 TABLET BY MOUTH EVERY DAY 30 tablet 0  . ferrous gluconate (FERGON) 324 MG tablet TAKE 1 TABLET BY MOUTH TWICE A DAY WITH MEALS 180 tablet 1  . furosemide (LASIX) 40 MG tablet TAKE 1 TABLET BY MOUTH EVERY DAY 90 tablet 1  . glipiZIDE (GLUCOTROL) 5 MG tablet TAKE 1 TABLET BY MOUTH DAILY BEFORE BREAKFAST. SCHEDULE  APPOINTMENT FOR FUTURE REFILLS 90 tablet 0  . KLOR-CON M20 20 MEQ tablet TAKE 1 TABLET BY MOUTH EVERY DAY 90 tablet 0  . metFORMIN (GLUCOPHAGE) 500 MG tablet TAKE 1 TABLET BY MOUTH 2 TIMES DAILY WITH A MEAL. 180 tablet 1  . montelukast (SINGULAIR) 10 MG tablet Take 1 tablet (10 mg total) by mouth at bedtime. 30 tablet 3  . nebivolol (BYSTOLIC) 5 MG tablet TAKE 1 TABLET BY MOUTH EVERY DAY 90 tablet 1  . nitroGLYCERIN (NITROSTAT) 0.4 MG SL tablet Place 1 tablet (0.4 mg total) under the tongue every 5 (five) minutes as needed for chest pain. 25 tablet 0  . triamcinolone (NASACORT ALLERGY 24HR) 55 MCG/ACT AERO nasal inhaler Place 2 sprays into the nose daily.    . vitamin B-12 1000 MCG tablet Take 1 tablet (1,000 mcg total) by mouth daily.     No current facility-administered medications on file prior to visit.    Past Medical History:  Diagnosis Date  . Allergy   . Asthma   . CAD (coronary artery disease)   . COPD (chronic obstructive pulmonary disease) (HCC)   . Depression   . GERD (gastroesophageal reflux disease)   . Hyperlipidemia   . Hypertension   . Myocardial infarction (HCC) 10/16/2013   No Known Allergies  Social History   Socioeconomic  History  . Marital status: Widowed    Spouse name: Not on file  . Number of children: Not on file  . Years of education: Not on file  . Highest education level: Not on file  Occupational History  . Not on file  Tobacco Use  . Smoking status: Former    Current packs/day: 0.00    Average packs/day: 1 pack/day for 45.0 years (45.0 ttl pk-yrs)    Types: Cigarettes    Start date: 10/16/1968    Quit date: 10/16/2013    Years since quitting: 9.4  . Smokeless tobacco: Never  Vaping Use  . Vaping status: Never Used  Substance and Sexual Activity  . Alcohol use: Not Currently    Comment: occasional  . Drug use: No  . Sexual activity: Not Currently  Other Topics Concern  . Not on file  Social History Narrative  . Not on file   Social  Determinants of Health   Financial Resource Strain: Low Risk  (05/02/2022)   Overall Financial Resource Strain (CARDIA)   . Difficulty of Paying Living Expenses: Not hard at all  Food Insecurity: No Food Insecurity (12/16/2022)   Hunger Vital Sign   . Worried About Programme researcher, broadcasting/film/video in the Last Year: Never true   . Ran Out of Food in the Last Year: Never true  Transportation Needs: No Transportation Needs (05/02/2022)   PRAPARE - Transportation   . Lack of Transportation (Medical): No   . Lack of Transportation (Non-Medical): No  Physical Activity: Inactive (05/02/2022)   Exercise Vital Sign   . Days of Exercise per Week: 0 days   . Minutes of Exercise per Session: 0 min  Stress: No Stress Concern Present (05/02/2022)   [employer] of Occupational Health - Occupational Stress Questionnaire   . Feeling of Stress : Not at all  Social Connections: Socially Isolated (04/29/2021)   Social Connection and Isolation Panel [NHANES]   . Frequency of Communication with Friends and Family: Twice a week   . Frequency of Social Gatherings with Friends and Family: Twice a week   . Attends Religious Services: Never   . Active Member of Clubs or Organizations: No   . Attends Banker Meetings: Never   . Marital Status: Widowed    There were no vitals filed for this visit. There is no height or weight on file to calculate BMI.  Physical Exam  ASSESSMENT AND PLAN:  There are no diagnoses linked to this encounter.  No orders of the defined types were placed in this encounter.   No problem-specific Assessment & Plan notes found for this encounter.   No follow-ups on file.  Betty G. [place], MD  [facility]. [place] office.

## 2023-04-07 ENCOUNTER — Ambulatory Visit: Payer: Medicare Other | Admitting: Family Medicine

## 2023-04-08 ENCOUNTER — Other Ambulatory Visit: Payer: Self-pay

## 2023-04-08 ENCOUNTER — Emergency Department (HOSPITAL_COMMUNITY): Payer: Medicare Other

## 2023-04-08 ENCOUNTER — Inpatient Hospital Stay (HOSPITAL_COMMUNITY)
Admission: EM | Admit: 2023-04-08 | Discharge: 2023-04-14 | DRG: 291 | Disposition: A | Discharge status: Home Health | Payer: Medicare Other | Attending: Internal Medicine | Admitting: Internal Medicine

## 2023-04-08 ENCOUNTER — Encounter (HOSPITAL_COMMUNITY): Payer: Self-pay

## 2023-04-08 DIAGNOSIS — I272 Pulmonary hypertension, unspecified: Secondary | ICD-10-CM | POA: Diagnosis not present

## 2023-04-08 DIAGNOSIS — D509 Iron deficiency anemia, unspecified: Secondary | ICD-10-CM | POA: Diagnosis present

## 2023-04-08 DIAGNOSIS — Z83438 Family history of other disorder of lipoprotein metabolism and other lipidemia: Secondary | ICD-10-CM

## 2023-04-08 DIAGNOSIS — N1831 Chronic kidney disease, stage 3a: Secondary | ICD-10-CM | POA: Diagnosis present

## 2023-04-08 DIAGNOSIS — Z1152 Encounter for screening for COVID-19: Secondary | ICD-10-CM | POA: Diagnosis not present

## 2023-04-08 DIAGNOSIS — J9621 Acute and chronic respiratory failure with hypoxia: Secondary | ICD-10-CM | POA: Diagnosis present

## 2023-04-08 DIAGNOSIS — I130 Hypertensive heart and chronic kidney disease with heart failure and stage 1 through stage 4 chronic kidney disease, or unspecified chronic kidney disease: Principal | ICD-10-CM | POA: Diagnosis present

## 2023-04-08 DIAGNOSIS — Z79899 Other long term (current) drug therapy: Secondary | ICD-10-CM

## 2023-04-08 DIAGNOSIS — I255 Ischemic cardiomyopathy: Secondary | ICD-10-CM | POA: Diagnosis not present

## 2023-04-08 DIAGNOSIS — E1122 Type 2 diabetes mellitus with diabetic chronic kidney disease: Secondary | ICD-10-CM | POA: Diagnosis present

## 2023-04-08 DIAGNOSIS — F39 Unspecified mood [affective] disorder: Secondary | ICD-10-CM | POA: Diagnosis present

## 2023-04-08 DIAGNOSIS — Z818 Family history of other mental and behavioral disorders: Secondary | ICD-10-CM

## 2023-04-08 DIAGNOSIS — I2510 Atherosclerotic heart disease of native coronary artery without angina pectoris: Secondary | ICD-10-CM | POA: Diagnosis present

## 2023-04-08 DIAGNOSIS — J9622 Acute and chronic respiratory failure with hypercapnia: Secondary | ICD-10-CM | POA: Diagnosis present

## 2023-04-08 DIAGNOSIS — E669 Obesity, unspecified: Secondary | ICD-10-CM | POA: Diagnosis present

## 2023-04-08 DIAGNOSIS — Z951 Presence of aortocoronary bypass graft: Secondary | ICD-10-CM

## 2023-04-08 DIAGNOSIS — I10 Essential (primary) hypertension: Secondary | ICD-10-CM | POA: Diagnosis not present

## 2023-04-08 DIAGNOSIS — I5A Non-ischemic myocardial injury (non-traumatic): Secondary | ICD-10-CM | POA: Diagnosis present

## 2023-04-08 DIAGNOSIS — Z7982 Long term (current) use of aspirin: Secondary | ICD-10-CM

## 2023-04-08 DIAGNOSIS — Z809 Family history of malignant neoplasm, unspecified: Secondary | ICD-10-CM

## 2023-04-08 DIAGNOSIS — Z87891 Personal history of nicotine dependence: Secondary | ICD-10-CM

## 2023-04-08 DIAGNOSIS — I5043 Acute on chronic combined systolic (congestive) and diastolic (congestive) heart failure: Secondary | ICD-10-CM | POA: Diagnosis present

## 2023-04-08 DIAGNOSIS — R0689 Other abnormalities of breathing: Secondary | ICD-10-CM | POA: Diagnosis not present

## 2023-04-08 DIAGNOSIS — Z6836 Body mass index (BMI) 36.0-36.9, adult: Secondary | ICD-10-CM

## 2023-04-08 DIAGNOSIS — Z803 Family history of malignant neoplasm of breast: Secondary | ICD-10-CM

## 2023-04-08 DIAGNOSIS — J449 Chronic obstructive pulmonary disease, unspecified: Secondary | ICD-10-CM

## 2023-04-08 DIAGNOSIS — E1165 Type 2 diabetes mellitus with hyperglycemia: Secondary | ICD-10-CM | POA: Diagnosis not present

## 2023-04-08 DIAGNOSIS — N179 Acute kidney failure, unspecified: Secondary | ICD-10-CM | POA: Diagnosis present

## 2023-04-08 DIAGNOSIS — Z743 Need for continuous supervision: Secondary | ICD-10-CM | POA: Diagnosis not present

## 2023-04-08 DIAGNOSIS — Z9981 Dependence on supplemental oxygen: Secondary | ICD-10-CM

## 2023-04-08 DIAGNOSIS — Z043 Encounter for examination and observation following other accident: Secondary | ICD-10-CM | POA: Diagnosis not present

## 2023-04-08 DIAGNOSIS — Z813 Family history of other psychoactive substance abuse and dependence: Secondary | ICD-10-CM

## 2023-04-08 DIAGNOSIS — I517 Cardiomegaly: Secondary | ICD-10-CM | POA: Diagnosis not present

## 2023-04-08 DIAGNOSIS — Z7951 Long term (current) use of inhaled steroids: Secondary | ICD-10-CM | POA: Diagnosis not present

## 2023-04-08 DIAGNOSIS — E785 Hyperlipidemia, unspecified: Secondary | ICD-10-CM | POA: Diagnosis present

## 2023-04-08 DIAGNOSIS — Z8249 Family history of ischemic heart disease and other diseases of the circulatory system: Secondary | ICD-10-CM

## 2023-04-08 DIAGNOSIS — Z7401 Bed confinement status: Secondary | ICD-10-CM

## 2023-04-08 DIAGNOSIS — I5042 Chronic combined systolic (congestive) and diastolic (congestive) heart failure: Secondary | ICD-10-CM | POA: Diagnosis not present

## 2023-04-08 DIAGNOSIS — I5023 Acute on chronic systolic (congestive) heart failure: Secondary | ICD-10-CM | POA: Diagnosis not present

## 2023-04-08 DIAGNOSIS — R6889 Other general symptoms and signs: Secondary | ICD-10-CM | POA: Diagnosis not present

## 2023-04-08 DIAGNOSIS — Z8261 Family history of arthritis: Secondary | ICD-10-CM

## 2023-04-08 DIAGNOSIS — J441 Chronic obstructive pulmonary disease with (acute) exacerbation: Principal | ICD-10-CM | POA: Diagnosis present

## 2023-04-08 DIAGNOSIS — R531 Weakness: Secondary | ICD-10-CM | POA: Diagnosis not present

## 2023-04-08 DIAGNOSIS — J9611 Chronic respiratory failure with hypoxia: Secondary | ICD-10-CM | POA: Diagnosis not present

## 2023-04-08 DIAGNOSIS — R0989 Other specified symptoms and signs involving the circulatory and respiratory systems: Secondary | ICD-10-CM | POA: Diagnosis not present

## 2023-04-08 DIAGNOSIS — I252 Old myocardial infarction: Secondary | ICD-10-CM

## 2023-04-08 DIAGNOSIS — Z7984 Long term (current) use of oral hypoglycemic drugs: Secondary | ICD-10-CM

## 2023-04-08 LAB — CBC WITH DIFFERENTIAL/PLATELET
Abs Immature Granulocytes: 0.1 10*3/uL — ABNORMAL HIGH (ref 0.00–0.07)
Basophils Absolute: 0.1 10*3/uL (ref 0.0–0.1)
Basophils Relative: 0 %
Eosinophils Absolute: 0.1 10*3/uL (ref 0.0–0.5)
Eosinophils Relative: 1 %
HCT: 29.2 % — ABNORMAL LOW (ref 36.0–46.0)
Hemoglobin: 9.2 g/dL — ABNORMAL LOW (ref 12.0–15.0)
Immature Granulocytes: 1 %
Lymphocytes Relative: 13 %
Lymphs Abs: 1.4 10*3/uL (ref 0.7–4.0)
MCH: 31.2 pg (ref 26.0–34.0)
MCHC: 31.5 g/dL (ref 30.0–36.0)
MCV: 99 fL (ref 80.0–100.0)
Monocytes Absolute: 0.5 10*3/uL (ref 0.1–1.0)
Monocytes Relative: 4 %
Neutro Abs: 9.2 10*3/uL — ABNORMAL HIGH (ref 1.7–7.7)
Neutrophils Relative %: 81 %
Platelets: 200 10*3/uL (ref 150–400)
RBC: 2.95 MIL/uL — ABNORMAL LOW (ref 3.87–5.11)
RDW: 12.6 % (ref 11.5–15.5)
WBC: 11.3 10*3/uL — ABNORMAL HIGH (ref 4.0–10.5)
nRBC: 0 % (ref 0.0–0.2)

## 2023-04-08 LAB — I-STAT VENOUS BLOOD GAS, ED
Acid-Base Excess: 12 mmol/L — ABNORMAL HIGH (ref 0.0–2.0)
Bicarbonate: 41.5 mmol/L — ABNORMAL HIGH (ref 20.0–28.0)
Calcium, Ion: 1.18 mmol/L (ref 1.15–1.40)
HCT: 28 % — ABNORMAL LOW (ref 36.0–46.0)
Hemoglobin: 9.5 g/dL — ABNORMAL LOW (ref 12.0–15.0)
O2 Saturation: 46 %
Potassium: 4.6 mmol/L (ref 3.5–5.1)
Sodium: 139 mmol/L (ref 135–145)
TCO2: 44 mmol/L — ABNORMAL HIGH (ref 22–32)
pCO2, Ven: 92.2 mmHg (ref 44–60)
pH, Ven: 7.261 (ref 7.25–7.43)
pO2, Ven: 31 mmHg — CL (ref 32–45)

## 2023-04-08 LAB — URINALYSIS, ROUTINE W REFLEX MICROSCOPIC
Bilirubin Urine: NEGATIVE
Glucose, UA: NEGATIVE mg/dL
Ketones, ur: NEGATIVE mg/dL
Leukocytes,Ua: NEGATIVE
Nitrite: NEGATIVE
Protein, ur: NEGATIVE mg/dL
Specific Gravity, Urine: 1.015 (ref 1.005–1.030)
pH: 6 (ref 5.0–8.0)

## 2023-04-08 LAB — COMPREHENSIVE METABOLIC PANEL
ALT: 15 U/L (ref 0–44)
AST: 14 U/L — ABNORMAL LOW (ref 15–41)
Albumin: 3.1 g/dL — ABNORMAL LOW (ref 3.5–5.0)
Alkaline Phosphatase: 72 U/L (ref 38–126)
Anion gap: 12 (ref 5–15)
BUN: 17 mg/dL (ref 8–23)
CO2: 38 mmol/L — ABNORMAL HIGH (ref 22–32)
Calcium: 8.9 mg/dL (ref 8.9–10.3)
Chloride: 90 mmol/L — ABNORMAL LOW (ref 98–111)
Creatinine, Ser: 1.23 mg/dL — ABNORMAL HIGH (ref 0.44–1.00)
GFR, Estimated: 46 mL/min — ABNORMAL LOW (ref >60)
Glucose, Bld: 133 mg/dL — ABNORMAL HIGH (ref 70–99)
Potassium: 4.6 mmol/L (ref 3.5–5.1)
Sodium: 140 mmol/L (ref 135–145)
Total Bilirubin: 0.5 mg/dL (ref 0.3–1.2)
Total Protein: 6.3 g/dL — ABNORMAL LOW (ref 6.5–8.1)

## 2023-04-08 LAB — SARS CORONAVIRUS 2 BY RT PCR: SARS Coronavirus 2 by RT PCR: NEGATIVE

## 2023-04-08 LAB — TSH: TSH: 1.742 u[IU]/mL (ref 0.350–4.500)

## 2023-04-08 LAB — TROPONIN I (HIGH SENSITIVITY)
Troponin I (High Sensitivity): 81 ng/L — ABNORMAL HIGH (ref <18)
Troponin I (High Sensitivity): 68 ng/L — ABNORMAL HIGH (ref <18)

## 2023-04-08 LAB — URINALYSIS, MICROSCOPIC (REFLEX)

## 2023-04-08 LAB — BRAIN NATRIURETIC PEPTIDE: B Natriuretic Peptide: 626.5 pg/mL — ABNORMAL HIGH (ref 0.0–100.0)

## 2023-04-08 LAB — MAGNESIUM: Magnesium: 1.7 mg/dL (ref 1.7–2.4)

## 2023-04-08 MED ORDER — ATORVASTATIN CALCIUM 80 MG PO TABS
80.0000 mg | ORAL_TABLET | Freq: Every day | ORAL | Status: DC
  Administered 2023-04-09 – 2023-04-13 (×6): 80 mg via ORAL
  Filled 2023-04-08 (×6): qty 1

## 2023-04-08 MED ORDER — NEBIVOLOL HCL 5 MG PO TABS
5.0000 mg | ORAL_TABLET | Freq: Every day | ORAL | Status: DC
  Administered 2023-04-09 – 2023-04-14 (×6): 5 mg via ORAL
  Filled 2023-04-08 (×6): qty 1

## 2023-04-08 MED ORDER — MONTELUKAST SODIUM 10 MG PO TABS
10.0000 mg | ORAL_TABLET | Freq: Every day | ORAL | Status: DC
  Administered 2023-04-09 – 2023-04-13 (×6): 10 mg via ORAL
  Filled 2023-04-08 (×6): qty 1

## 2023-04-08 MED ORDER — INSULIN ASPART 100 UNIT/ML IJ SOLN
0.0000 [IU] | Freq: Three times a day (TID) | INTRAMUSCULAR | Status: DC
  Administered 2023-04-09: 2 [IU] via SUBCUTANEOUS
  Administered 2023-04-09: 3 [IU] via SUBCUTANEOUS
  Administered 2023-04-09: 2 [IU] via SUBCUTANEOUS
  Administered 2023-04-10 (×2): 1 [IU] via SUBCUTANEOUS
  Administered 2023-04-10: 2 [IU] via SUBCUTANEOUS
  Administered 2023-04-11: 1 [IU] via SUBCUTANEOUS
  Administered 2023-04-11: 2 [IU] via SUBCUTANEOUS
  Administered 2023-04-12: 3 [IU] via SUBCUTANEOUS
  Administered 2023-04-12 – 2023-04-13 (×2): 2 [IU] via SUBCUTANEOUS
  Administered 2023-04-13: 1 [IU] via SUBCUTANEOUS

## 2023-04-08 MED ORDER — AZITHROMYCIN 500 MG PO TABS
500.0000 mg | ORAL_TABLET | ORAL | Status: AC
  Administered 2023-04-09 – 2023-04-10 (×2): 500 mg via ORAL
  Filled 2023-04-08 (×2): qty 1

## 2023-04-08 MED ORDER — EZETIMIBE 10 MG PO TABS
10.0000 mg | ORAL_TABLET | Freq: Every day | ORAL | Status: DC
  Administered 2023-04-09 – 2023-04-14 (×6): 10 mg via ORAL
  Filled 2023-04-08 (×6): qty 1

## 2023-04-08 MED ORDER — ENOXAPARIN SODIUM 40 MG/0.4ML IJ SOSY
40.0000 mg | PREFILLED_SYRINGE | INTRAMUSCULAR | Status: DC
  Administered 2023-04-09 – 2023-04-10 (×2): 40 mg via SUBCUTANEOUS
  Filled 2023-04-08 (×2): qty 0.4

## 2023-04-08 MED ORDER — ACETAMINOPHEN 500 MG PO TABS
1000.0000 mg | ORAL_TABLET | Freq: Four times a day (QID) | ORAL | Status: DC | PRN
  Administered 2023-04-09 – 2023-04-13 (×4): 1000 mg via ORAL
  Filled 2023-04-08 (×4): qty 2

## 2023-04-08 MED ORDER — SODIUM CHLORIDE 0.9 % IV SOLN
1.0000 g | INTRAVENOUS | Status: AC
  Administered 2023-04-09 – 2023-04-12 (×4): 1 g via INTRAVENOUS
  Filled 2023-04-08 (×4): qty 10

## 2023-04-08 MED ORDER — FUROSEMIDE 10 MG/ML IJ SOLN
40.0000 mg | Freq: Once | INTRAMUSCULAR | Status: AC
  Administered 2023-04-09: 40 mg via INTRAVENOUS
  Filled 2023-04-08: qty 4

## 2023-04-08 MED ORDER — PREDNISONE 20 MG PO TABS
40.0000 mg | ORAL_TABLET | Freq: Every day | ORAL | Status: AC
  Administered 2023-04-09 – 2023-04-13 (×5): 40 mg via ORAL
  Filled 2023-04-08 (×5): qty 2

## 2023-04-08 MED ORDER — ESCITALOPRAM OXALATE 10 MG PO TABS
20.0000 mg | ORAL_TABLET | Freq: Every day | ORAL | Status: DC
  Administered 2023-04-09 – 2023-04-13 (×5): 20 mg via ORAL
  Filled 2023-04-08 (×5): qty 2

## 2023-04-08 MED ORDER — ALBUTEROL SULFATE (2.5 MG/3ML) 0.083% IN NEBU: 2.5000 mg | INHALATION_SOLUTION | RESPIRATORY_TRACT | Status: DC | PRN

## 2023-04-08 MED ORDER — IPRATROPIUM-ALBUTEROL 0.5-2.5 (3) MG/3ML IN SOLN
3.0000 mL | RESPIRATORY_TRACT | Status: AC
  Administered 2023-04-08 (×3): 3 mL via RESPIRATORY_TRACT
  Filled 2023-04-08: qty 6
  Filled 2023-04-08: qty 3

## 2023-04-08 MED ORDER — SODIUM CHLORIDE 0.9% FLUSH
3.0000 mL | Freq: Two times a day (BID) | INTRAVENOUS | Status: DC
  Administered 2023-04-09 – 2023-04-13 (×11): 3 mL via INTRAVENOUS

## 2023-04-08 MED ORDER — VITAMIN B-12 1000 MCG PO TABS
1000.0000 ug | ORAL_TABLET | Freq: Every day | ORAL | Status: DC
  Administered 2023-04-09 – 2023-04-14 (×6): 1000 ug via ORAL
  Filled 2023-04-08 (×6): qty 1

## 2023-04-08 MED ORDER — ASPIRIN 81 MG PO TBEC
81.0000 mg | DELAYED_RELEASE_TABLET | Freq: Every day | ORAL | Status: DC
  Administered 2023-04-09 – 2023-04-14 (×6): 81 mg via ORAL
  Filled 2023-04-08 (×6): qty 1

## 2023-04-08 MED ORDER — SODIUM CHLORIDE 0.9 % IV SOLN
1.0000 g | Freq: Once | INTRAVENOUS | Status: AC
  Administered 2023-04-08: 1 g via INTRAVENOUS
  Filled 2023-04-08: qty 10

## 2023-04-08 MED ORDER — FERROUS GLUCONATE 324 (38 FE) MG PO TABS
324.0000 mg | ORAL_TABLET | Freq: Two times a day (BID) | ORAL | Status: DC
  Administered 2023-04-09 – 2023-04-14 (×11): 324 mg via ORAL
  Filled 2023-04-08 (×12): qty 1

## 2023-04-08 MED ORDER — METHYLPREDNISOLONE SODIUM SUCC 125 MG IJ SOLR
125.0000 mg | Freq: Once | INTRAMUSCULAR | Status: AC
  Administered 2023-04-08: 125 mg via INTRAVENOUS
  Filled 2023-04-08: qty 2

## 2023-04-08 MED ORDER — SODIUM CHLORIDE 0.9 % IV SOLN
500.0000 mg | Freq: Once | INTRAVENOUS | Status: AC
  Administered 2023-04-08: 500 mg via INTRAVENOUS
  Filled 2023-04-08: qty 5

## 2023-04-08 MED ORDER — MOMETASONE FURO-FORMOTEROL FUM 200-5 MCG/ACT IN AERO
2.0000 | INHALATION_SPRAY | Freq: Two times a day (BID) | RESPIRATORY_TRACT | Status: DC
  Administered 2023-04-09: 2 via RESPIRATORY_TRACT
  Filled 2023-04-08: qty 8.8

## 2023-04-08 MED ORDER — IPRATROPIUM-ALBUTEROL 0.5-2.5 (3) MG/3ML IN SOLN
3.0000 mL | RESPIRATORY_TRACT | Status: DC
  Administered 2023-04-09: 3 mL via RESPIRATORY_TRACT
  Filled 2023-04-08: qty 3

## 2023-04-08 NOTE — ED Notes (Signed)
ED TO INPATIENT HANDOFF REPORT  ED Nurse Name and Phone #: Kavi Almquist [phone]  S Name/Age/Gender Beverly Wheeler 77 y.o. female Room/Bed: 022C/022C  Code Status   Code Status: Full Code  Home/SNF/Other Home Patient oriented to: self, place, time, and situation Is this baseline? Yes   Triage Complete: Triage complete  Chief Complaint COPD exacerbation [facility]) [J44.1]  Triage Note Patient arrives from home by EMS with c/o waking up this morning and stating "my knees and me legs wouldn't work".   Patient reports made it to the bathroom but her son had to help her back to bed.  Patient reports while walking to ambulance patient reports fell on her knees.   Patient reports at baseline walks with walker.     EMS VITALS 124/68 HR 70 94% 2L [place] (baseline for patient) CBG 179   Allergies No Known Allergies  Level of Care/Admitting Diagnosis ED Disposition     ED Disposition  Admit   Condition  --   Comment  Hospital Area: [facility] [[id number]]  Level of Care: Progressive [102]  Admit to Progressive based on following criteria: RESPIRATORY PROBLEMS hypoxemic/hypercapnic respiratory failure that is responsive to NIPPV (BiPAP) or High Flow Nasal Cannula (6-80 lpm). Frequent assessment/intervention, no > Q2 hrs < Q4 hrs, to maintain oxygenation and pulmonary hygiene.  May admit patient to [facility] or [facility] if equivalent level of care is available:: Yes  Covid Evaluation: Asymptomatic - no recent exposure (last 10 days) testing not required  Diagnosis: COPD exacerbation [facility]) [[id number]]  Admitting Physician: Dolly Rias [[id number]]  Attending Physician: Dolly Rias [[id number]]  Certification:: I certify this patient will need inpatient services for at least 2 midnights  Expected Medical Readiness: 04/11/2023          B Medical/Surgery History Past Medical History:  Diagnosis Date   Allergy    Asthma    CAD (coronary artery disease)    COPD  (chronic obstructive pulmonary disease) (HCC)    Depression    GERD (gastroesophageal reflux disease)    Hyperlipidemia    Hypertension    Myocardial infarction (HCC) 10/16/2013   Past Surgical History:  Procedure Laterality Date   CARDIAC CATHETERIZATION  10/17/13   significant 3 vessel disease   LEFT HEART CATHETERIZATION WITH CORONARY ANGIOGRAM N/A 10/17/2013   Procedure: LEFT HEART CATHETERIZATION WITH CORONARY ANGIOGRAM;  Surgeon: Lesleigh Noe, MD;  Location: [facility] CATH LAB;  Service: Cardiovascular;  Laterality: N/A;     A IV Location/Drains/Wounds Patient Lines/Drains/Airways Status     Active Line/Drains/Airways     Name Placement date Placement time Site Days   Peripheral IV Left Antecubital --  --  Antecubital  --   External Urinary Catheter 01/14/22  1925  --  449   Pressure Injury 11/23/17 Stage II -  Partial thickness loss of dermis presenting as a shallow open ulcer with a red, pink wound bed without slough. non healing 11/23/17  1600  -- 1962   Wound / Incision (Open or Dehisced) 11/22/17 Leg Bilateral;Lower venous stasis/cellulitic ulcers 11/22/17  0100  Leg  1963            Intake/Output Last 24 hours No intake or output data in the 24 hours ending 04/08/23 2347  Labs/Imaging Results for orders placed or performed during the hospital encounter of 04/08/23 (from the past 48 hour(s))  CBC with Differential     Status: Abnormal   Collection Time: 04/08/23  6:43 PM  Result Value  Ref Range   WBC 11.3 (H) 4.0 - 10.5 K/uL   RBC 2.95 (L) 3.87 - 5.11 MIL/uL   Hemoglobin 9.2 (L) 12.0 - 15.0 g/dL   HCT 43.3 (L) 29.5 - 18.8 %   MCV 99.0 80.0 - 100.0 fL   MCH 31.2 26.0 - 34.0 pg   MCHC 31.5 30.0 - 36.0 g/dL   RDW 41.6 60.6 - 30.1 %   Platelets 200 150 - 400 K/uL   nRBC 0.0 0.0 - 0.2 %   Neutrophils Relative % 81 %   Neutro Abs 9.2 (H) 1.7 - 7.7 K/uL   Lymphocytes Relative 13 %   Lymphs Abs 1.4 0.7 - 4.0 K/uL   Monocytes Relative 4 %   Monocytes Absolute 0.5  0.1 - 1.0 K/uL   Eosinophils Relative 1 %   Eosinophils Absolute 0.1 0.0 - 0.5 K/uL   Basophils Relative 0 %   Basophils Absolute 0.1 0.0 - 0.1 K/uL   Immature Granulocytes 1 %   Abs Immature Granulocytes 0.10 (H) 0.00 - 0.07 K/uL    Comment: Performed at [facility], [address], [place], [place] [address]  Comprehensive metabolic panel     Status: Abnormal   Collection Time: 04/08/23  6:43 PM  Result Value Ref Range   Sodium 140 135 - 145 mmol/L   Potassium 4.6 3.5 - 5.1 mmol/L   Chloride 90 (L) 98 - 111 mmol/L   CO2 38 (H) 22 - 32 mmol/L   Glucose, Bld 133 (H) 70 - 99 mg/dL    Comment: Glucose reference range applies only to samples taken after fasting for at least 8 hours.   BUN 17 8 - 23 mg/dL   Creatinine, Ser 3.23 (H) 0.44 - 1.00 mg/dL   Calcium 8.9 8.9 - 55.7 mg/dL   Total Protein 6.3 (L) 6.5 - 8.1 g/dL   Albumin 3.1 (L) 3.5 - 5.0 g/dL   AST 14 (L) 15 - 41 U/L   ALT 15 0 - 44 U/L   Alkaline Phosphatase 72 38 - 126 U/L   Total Bilirubin 0.5 0.3 - 1.2 mg/dL   GFR, Estimated 46 (L) >60 mL/min    Comment: (NOTE) Calculated using the CKD-EPI Creatinine Equation (2021)    Anion gap 12 5 - 15    Comment: Performed at [facility], [address] [place], [place] [address]  Magnesium     Status: None   Collection Time: 04/08/23  6:43 PM  Result Value Ref Range   Magnesium 1.7 1.7 - 2.4 mg/dL    Comment: Performed at [facility], [address], [place], [place] [address]  TSH     Status: None   Collection Time: 04/08/23  6:43 PM  Result Value Ref Range   TSH 1.742 0.350 - 4.500 uIU/mL    Comment: Performed by a 3rd Generation assay with a functional sensitivity of <=0.01 uIU/mL. Performed at [facility], [address] [place], [place] [address]   Troponin I (High Sensitivity)     Status: Abnormal   Collection Time: 04/08/23  6:43 PM  Result Value Ref Range   Troponin I (High Sensitivity) 81 (H) <18 ng/L    Comment: (NOTE) Elevated high  sensitivity troponin I (hsTnI) values and significant  changes across serial measurements may suggest ACS but many other  chronic and acute conditions are known to elevate hsTnI results.  Refer to the "Links" section for chest pain algorithms and additional  guidance. Performed at [facility], [address], [place], [place] [address]   Brain natriuretic peptide     Status: Abnormal   Collection Time: 04/08/23  6:43 PM  Result Value Ref Range   B Natriuretic Peptide 626.5 (H) 0.0 - 100.0 pg/mL    Comment: Performed at [facility], [address] [place], [place] [address]  SARS Coronavirus 2 by RT PCR (hospital order, performed in [facility] hospital lab) *cepheid single result test* Anterior Nasal Swab     Status: None   Collection Time: 04/08/23  6:43 PM   Specimen: Anterior Nasal Swab  Result Value Ref Range   SARS Coronavirus 2 by RT PCR NEGATIVE NEGATIVE    Comment: Performed at [facility], [address] [place], [place] [address]  I-Stat venous blood gas, [facility] ED, [facility], [facility])     Status: Abnormal   Collection Time: 04/08/23  6:52 PM  Result Value Ref Range   pH, Ven 7.261 7.25 - 7.43   pCO2, Ven 92.2 (HH) 44 - 60 mmHg   pO2, Ven 31 (LL) 32 - 45 mmHg   Bicarbonate 41.5 (H) 20.0 - 28.0 mmol/L   TCO2 44 (H) 22 - 32 mmol/L   O2 Saturation 46 %   Acid-Base Excess 12.0 (H) 0.0 - 2.0 mmol/L   Sodium 139 135 - 145 mmol/L   Potassium 4.6 3.5 - 5.1 mmol/L   Calcium, Ion 1.18 1.15 - 1.40 mmol/L   HCT 28.0 (L) 36.0 - 46.0 %   Hemoglobin 9.5 (L) 12.0 - 15.0 g/dL   Sample type VENOUS    Comment NOTIFIED PHYSICIAN   Urinalysis, Routine w reflex microscopic -Urine, Clean Catch     Status: Abnormal   Collection Time: 04/08/23  8:57 PM  Result Value Ref Range   Color, Urine YELLOW YELLOW   APPearance HAZY (A) CLEAR   Specific Gravity, Urine 1.015 1.005 - 1.030   pH 6.0 5.0 - 8.0   Glucose, UA NEGATIVE NEGATIVE mg/dL   Hgb urine dipstick TRACE (A) NEGATIVE    Bilirubin Urine NEGATIVE NEGATIVE   Ketones, ur NEGATIVE NEGATIVE mg/dL   Protein, ur NEGATIVE NEGATIVE mg/dL   Nitrite NEGATIVE NEGATIVE   Leukocytes,Ua NEGATIVE NEGATIVE    Comment: Performed at [facility], [address], [place], [place] [address]  Urinalysis, Microscopic (reflex)     Status: Abnormal   Collection Time: 04/08/23  8:57 PM  Result Value Ref Range   RBC / HPF 0-5 0 - 5 RBC/hpf   WBC, UA 0-5 0 - 5 WBC/hpf   Bacteria, UA RARE (A) NONE SEEN   Squamous Epithelial / HPF 11-20 0 - 5 /HPF    Comment: Performed at [facility], [address], [place], [place] [address]  Troponin I (High Sensitivity)     Status: Abnormal   Collection Time: 04/08/23  9:53 PM  Result Value Ref Range   Troponin I (High Sensitivity) 68 (H) <18 ng/L    Comment: (NOTE) Elevated high sensitivity troponin I (hsTnI) values and significant  changes across serial measurements may suggest ACS but many other  chronic and acute conditions are known to elevate hsTnI results.  Refer to the "Links" section for chest pain algorithms and additional  guidance. Performed at [facility], [address] [place], [place] [address]    DG Knee Complete 4 Views Right  Result Date: 04/08/2023 CLINICAL DATA:  Larey Seat EXAM: RIGHT KNEE -  COMPLETE 4+ VIEW COMPARISON:  None Available. FINDINGS: Frontal, bilateral oblique, lateral views of the right knee are obtained. No fracture, subluxation, or dislocation. Three compartmental osteoarthritis greatest in the medial and patellofemoral compartments with mild joint space narrowing and osteophyte formation. No joint effusion. Soft tissues are unremarkable. IMPRESSION: 1. Mild 3 compartmental osteoarthritis.  No acute fracture. Electronically Signed   By: Sharlet Salina M.D.   On: 04/08/2023 17:44   DG Knee Complete 4 Views Left  Result Date: 04/08/2023 CLINICAL DATA:  Larey Seat EXAM: LEFT KNEE - COMPLETE 4+ VIEW COMPARISON:  None Available. FINDINGS: Frontal,  bilateral oblique, and lateral views of the left knee are obtained. No fracture, subluxation, or dislocation. 3 compartmental osteoarthritis, most pronounced within the medial and patellofemoral compartment with mild joint space narrowing and osteophyte formation. No joint effusion. Soft tissues are unremarkable. IMPRESSION: 1. Mild 3 compartmental osteoarthritis.  No acute fracture. Electronically Signed   By: Sharlet Salina M.D.   On: 04/08/2023 17:44   DG Chest Portable 1 View  Result Date: 04/08/2023 CLINICAL DATA:  Larey Seat EXAM: PORTABLE CHEST 1 VIEW COMPARISON:  03/15/2022 FINDINGS: Single frontal view of the chest demonstrates an enlarged cardiac silhouette. Central pulmonary vascular congestion without acute airspace disease, effusion, or pneumothorax. No acute displaced fracture. IMPRESSION: 1. Stable cardiomegaly and central vascular congestion. No acute intrathoracic process. Electronically Signed   By: Sharlet Salina M.D.   On: 04/08/2023 17:43    Pending Labs Unresulted Labs (From admission, onward)     Start     Ordered   04/09/23 0500  Basic metabolic panel  Tomorrow morning,   R        04/08/23 2337   04/09/23 0500  Magnesium  Tomorrow morning,   R        04/08/23 2337   04/09/23 0500  Phosphorus  Tomorrow morning,   R        04/08/23 2337   04/09/23 0500  Ferritin  Tomorrow morning,   R        04/08/23 2337   04/09/23 0500  Iron and TIBC  Tomorrow morning,   R        04/08/23 2337   04/09/23 0500  Transferrin  Tomorrow morning,   R        04/08/23 2337   04/09/23 0500  Vitamin B12  Tomorrow morning,   R        04/08/23 2337   04/09/23 0500  Hemoglobin A1c  Tomorrow morning,   R        04/08/23 2341   04/09/23 0500  Lipid panel  Tomorrow morning,   R        04/08/23 2341   04/08/23 2337  CBC  Once,   R        04/08/23 2337   04/08/23 2337  Resp panel by RT-PCR (RSV, Flu A&B, Covid) Anterior Nasal Swab  (Tier 2 - SARS Coronavirus 2 by RT PCR (hospital order, performed in  [facility] hospital lab) *cepheid single result test*)  Once,   R        04/08/23 2337   04/08/23 2332  Expectorated Sputum Assessment w Gram Stain, Rflx to Resp Cult  (COPD / Pneumonia / Cellulitis / Lower Extremity Wound)  Once,   R        04/08/23 2337   04/08/23 2331  Blood gas, venous  ONCE - URGENT,   URGENT        04/08/23 2337  Vitals/Pain Today's Vitals   04/08/23 2130 04/08/23 2200 04/08/23 2230 04/08/23 2315  BP: (!) 109/58 (!) 120/50 127/85 (!) 136/49  Pulse: 84 83 92 85  Resp: 20 19 (!) 24 15  Temp: 98.9 F (37.2 C)     TempSrc:      SpO2: 95% 95% 95% 94%  Weight:      Height:        Isolation Precautions No active isolations  Medications Medications  cefTRIAXone (ROCEPHIN) 1 g in sodium chloride 0.9 % 100 mL IVPB (has no administration in time range)  azithromycin (ZITHROMAX) tablet 500 mg (has no administration in time range)  predniSONE (DELTASONE) tablet 40 mg (has no administration in time range)  ipratropium-albuterol (DUONEB) 0.5-2.5 (3) MG/3ML nebulizer solution 3 mL (has no administration in time range)  albuterol (PROVENTIL) (2.5 MG/3ML) 0.083% nebulizer solution 2.5 mg (has no administration in time range)  furosemide (LASIX) injection 40 mg (has no administration in time range)  enoxaparin (LOVENOX) injection 40 mg (has no administration in time range)  sodium chloride flush (NS) 0.9 % injection 3 mL (has no administration in time range)  acetaminophen (TYLENOL) tablet 1,000 mg (has no administration in time range)  aspirin EC tablet 81 mg (has no administration in time range)  atorvastatin (LIPITOR) tablet 80 mg (has no administration in time range)  ezetimibe (ZETIA) tablet 10 mg (has no administration in time range)  nebivolol (BYSTOLIC) tablet 5 mg (has no administration in time range)  escitalopram (LEXAPRO) tablet 20 mg (has no administration in time range)  ferrous gluconate (FERGON) tablet 324 mg (has no administration in time  range)  cyanocobalamin (VITAMIN B12) tablet 1,000 mcg (has no administration in time range)  mometasone-formoterol (DULERA) 200-5 MCG/ACT inhaler 2 puff (has no administration in time range)  montelukast (SINGULAIR) tablet 10 mg (has no administration in time range)  insulin aspart (novoLOG) injection 0-6 Units (has no administration in time range)  ipratropium-albuterol (DUONEB) 0.5-2.5 (3) MG/3ML nebulizer solution 3 mL (3 mLs Nebulization Given 04/08/23 2012)  methylPREDNISolone sodium succinate (SOLU-MEDROL) 125 mg/2 mL injection 125 mg (125 mg Intravenous Given 04/08/23 1956)  cefTRIAXone (ROCEPHIN) 1 g in sodium chloride 0.9 % 100 mL IVPB (0 g Intravenous Stopped 04/08/23 2101)  azithromycin (ZITHROMAX) 500 mg in sodium chloride 0.9 % 250 mL IVPB (0 mg Intravenous Stopped 04/08/23 2149)    Mobility walks with device       R Recommendations: See Admitting Provider Note  Report given to:   Additional Notes: A&Ox 4, pt c/o of weakness this morning and fell onto knee with EMS, hx COPD, 2LPM at baseline, had multiple NEBS, elevated trops, but they are trending down

## 2023-04-08 NOTE — Progress Notes (Signed)
Spoke with MD about BIPAP order, it was ordered for hypercarbia. I attempted to get patient to wear it and she stated she is severely claustrophobic and does not want to wear it. I notified the ordering MD and recommended that we monitor patient at this time. MD agrees with plan. Pt is stable and coherent, in NAD at this time.

## 2023-04-08 NOTE — ED Provider Notes (Signed)
[place] EMERGENCY DEPARTMENT AT [facility] Provider Note   CSN: [id number] Arrival date & time: 04/08/23  1614     History  Chief Complaint  Patient presents with   Weakness    Tyyonna Rollerson is a 77 y.o. female.  HPI Patient reports that she started feeling slightly unwell yesterday however this morning since waking up has been very weak.  She reports that she was able to walk to the bathroom however was unable to dress herself or walk back.  At 1 point she was unable to continue walking so she lowered herself to the ground and landed on her knees.  She denies head trauma or loss of consciousness.  She does report that she has been out of one of her inhalers for the past 2 days.    Home Medications Prior to Admission medications   Medication Sig Start Date End Date Taking? Authorizing Provider  albuterol (VENTOLIN HFA) 108 (90 Base) MCG/ACT inhaler Inhale 1 puff every 4 hours as needed for wheezing or shortness of breath. 07/19/22  Yes [place], Betty G, MD  aspirin EC 81 MG EC tablet Take 1 tablet (81 mg total) by mouth daily. 10/21/13  Yes Barrett, Joline Salt, PA-C  atorvastatin (LIPITOR) 80 MG tablet TAKE 1 TABLET BY MOUTH EVERY DAY AT 6PM 03/24/23  Yes [place], Betty G, MD  cetirizine (ZYRTEC) 10 MG tablet Take 10 mg by mouth daily.   Yes [provider]  DULERA 200-5 MCG/ACT AERO INHALE 2 PUFFS INTO THE LUNGS TWICE A DAY 01/23/23  Yes [place], Betty G, MD  escitalopram (LEXAPRO) 20 MG tablet TAKE 1 TABLET BY MOUTH EVERY DAY 03/24/23  Yes [place], Betty G, MD  ezetimibe (ZETIA) 10 MG tablet TAKE 1 TABLET BY MOUTH EVERY DAY 03/28/23  Yes [place], Betty G, MD  ferrous gluconate (FERGON) 324 MG tablet TAKE 1 TABLET BY MOUTH TWICE A DAY WITH MEALS 08/09/19  Yes [place], Betty G, MD  furosemide (LASIX) 40 MG tablet TAKE 1 TABLET BY MOUTH EVERY DAY 11/28/22  Yes [place], Betty G, MD  glipiZIDE (GLUCOTROL) 5 MG tablet TAKE 1 TABLET BY MOUTH DAILY BEFORE BREAKFAST. SCHEDULE  APPOINTMENT FOR FUTURE REFILLS 11/30/22  Yes [place], Betty G, MD  KLOR-CON M20 20 MEQ tablet TAKE 1 TABLET BY MOUTH EVERY DAY 12/25/20  Yes [place], Betty G, MD  metFORMIN (GLUCOPHAGE) 500 MG tablet TAKE 1 TABLET BY MOUTH 2 TIMES DAILY WITH A MEAL. 10/14/22  Yes [place], Betty G, MD  montelukast (SINGULAIR) 10 MG tablet Take 1 tablet (10 mg total) by mouth at bedtime. 03/25/20  Yes [place], Betty G, MD  nebivolol (BYSTOLIC) 5 MG tablet TAKE 1 TABLET BY MOUTH EVERY DAY 10/14/22  Yes [place], Betty G, MD  nitroGLYCERIN (NITROSTAT) 0.4 MG SL tablet Place 1 tablet (0.4 mg total) under the tongue every 5 (five) minutes as needed for chest pain. 12/20/22  Yes [place], Betty G, MD  triamcinolone (NASACORT ALLERGY 24HR) 55 MCG/ACT AERO nasal inhaler Place 2 sprays into the nose daily.   Yes [provider]  vitamin B-12 1000 MCG tablet Take 1 tablet (1,000 mcg total) by mouth daily. 12/06/17  Yes Leroy Sea, MD  ACCU-CHEK GUIDE test strip USE TO CHECK BLOOD SUGARS 4 TIMES DAILY 09/30/22   [place], Betty G, MD  Accu-Chek Softclix Lancets lancets Use to check blood sugars 1-2 times daily. Dx:e11.9 05/04/20   [place], Betty G, MD  Blood Glucose Monitoring Suppl (ACCU-CHEK AVIVA PLUS) w/Device KIT Use  to test blood sugars 1-2 times daily. Dx: E11.9 05/04/20   [place], Betty G, MD      Allergies    Patient has no known allergies.    Review of Systems   Review of Systems  Physical Exam Updated Vital Signs BP 127/85   Pulse 92   Temp 98.9 F (37.2 C)   Resp (!) 24   Ht 5' (1.524 m)   Wt 93 kg   SpO2 95%   BMI 40.04 kg/m  Physical Exam Vitals and nursing note reviewed.  Constitutional:      General: She is not in acute distress.    Appearance: She is well-developed.  HENT:     Head: Normocephalic and atraumatic.  Eyes:     Conjunctiva/sclera: Conjunctivae normal.  Cardiovascular:     Rate and Rhythm: Normal rate and regular rhythm.     Heart sounds: No murmur heard. Pulmonary:     Effort:  Accessory muscle usage present.     Breath sounds: Decreased air movement present. Wheezing present.  Abdominal:     Palpations: Abdomen is soft.     Tenderness: There is no abdominal tenderness.  Musculoskeletal:        General: No swelling.     Cervical back: Neck supple.  Skin:    General: Skin is warm and dry.     Capillary Refill: Capillary refill takes less than 2 seconds.  Neurological:     Mental Status: She is alert.  Psychiatric:        Mood and Affect: Mood normal.     ED Results / Procedures / Treatments   Labs (all labs ordered are listed, but only abnormal results are displayed) Labs Reviewed  CBC WITH DIFFERENTIAL/PLATELET - Abnormal; Notable for the following components:      Result Value   WBC 11.3 (*)    RBC 2.95 (*)    Hemoglobin 9.2 (*)    HCT 29.2 (*)    Neutro Abs 9.2 (*)    Abs Immature Granulocytes 0.10 (*)    All other components within normal limits  COMPREHENSIVE METABOLIC PANEL - Abnormal; Notable for the following components:   Chloride 90 (*)    CO2 38 (*)    Glucose, Bld 133 (*)    Creatinine, Ser 1.23 (*)    Total Protein 6.3 (*)    Albumin 3.1 (*)    AST 14 (*)    GFR, Estimated 46 (*)    All other components within normal limits  BRAIN NATRIURETIC PEPTIDE - Abnormal; Notable for the following components:   B Natriuretic Peptide 626.5 (*)    All other components within normal limits  URINALYSIS, ROUTINE W REFLEX MICROSCOPIC - Abnormal; Notable for the following components:   APPearance HAZY (*)    Hgb urine dipstick TRACE (*)    All other components within normal limits  URINALYSIS, MICROSCOPIC (REFLEX) - Abnormal; Notable for the following components:   Bacteria, UA RARE (*)    All other components within normal limits  I-STAT VENOUS BLOOD GAS, ED - Abnormal; Notable for the following components:   pCO2, Ven 92.2 (*)    pO2, Ven 31 (*)    Bicarbonate 41.5 (*)    TCO2 44 (*)    Acid-Base Excess 12.0 (*)    HCT 28.0 (*)     Hemoglobin 9.5 (*)    All other components within normal limits  TROPONIN I (HIGH SENSITIVITY) - Abnormal; Notable for the following components:   Troponin  I (High Sensitivity) 81 (*)    All other components within normal limits  TROPONIN I (HIGH SENSITIVITY) - Abnormal; Notable for the following components:   Troponin I (High Sensitivity) 68 (*)    All other components within normal limits  SARS CORONAVIRUS 2 BY RT PCR  MAGNESIUM  TSH  TROPONIN I (HIGH SENSITIVITY)    EKG EKG Interpretation Date/Time:  Saturday April 08 2023 16:47:43 EDT Ventricular Rate:  71 PR Interval:  171 QRS Duration:  113 QT Interval:  394 QTC Calculation: 429 R Axis:   23  Text Interpretation: Sinus rhythm Nonspecific ST abnormality No significant change since last tracing Confirmed by Cathren Laine ([id number]) on 04/08/2023 5:19:02 PM  Radiology DG Knee Complete 4 Views Right  Result Date: 04/08/2023 CLINICAL DATA:  Larey Seat EXAM: RIGHT KNEE - COMPLETE 4+ VIEW COMPARISON:  None Available. FINDINGS: Frontal, bilateral oblique, lateral views of the right knee are obtained. No fracture, subluxation, or dislocation. Three compartmental osteoarthritis greatest in the medial and patellofemoral compartments with mild joint space narrowing and osteophyte formation. No joint effusion. Soft tissues are unremarkable. IMPRESSION: 1. Mild 3 compartmental osteoarthritis.  No acute fracture. Electronically Signed   By: Sharlet Salina M.D.   On: 04/08/2023 17:44   DG Knee Complete 4 Views Left  Result Date: 04/08/2023 CLINICAL DATA:  Larey Seat EXAM: LEFT KNEE - COMPLETE 4+ VIEW COMPARISON:  None Available. FINDINGS: Frontal, bilateral oblique, and lateral views of the left knee are obtained. No fracture, subluxation, or dislocation. 3 compartmental osteoarthritis, most pronounced within the medial and patellofemoral compartment with mild joint space narrowing and osteophyte formation. No joint effusion. Soft tissues are  unremarkable. IMPRESSION: 1. Mild 3 compartmental osteoarthritis.  No acute fracture. Electronically Signed   By: Sharlet Salina M.D.   On: 04/08/2023 17:44   DG Chest Portable 1 View  Result Date: 04/08/2023 CLINICAL DATA:  Larey Seat EXAM: PORTABLE CHEST 1 VIEW COMPARISON:  03/15/2022 FINDINGS: Single frontal view of the chest demonstrates an enlarged cardiac silhouette. Central pulmonary vascular congestion without acute airspace disease, effusion, or pneumothorax. No acute displaced fracture. IMPRESSION: 1. Stable cardiomegaly and central vascular congestion. No acute intrathoracic process. Electronically Signed   By: Sharlet Salina M.D.   On: 04/08/2023 17:43    Procedures Procedures    Medications Ordered in ED Medications  ipratropium-albuterol (DUONEB) 0.5-2.5 (3) MG/3ML nebulizer solution 3 mL (3 mLs Nebulization Given 04/08/23 2012)  methylPREDNISolone sodium succinate (SOLU-MEDROL) 125 mg/2 mL injection 125 mg (125 mg Intravenous Given 04/08/23 1956)  cefTRIAXone (ROCEPHIN) 1 g in sodium chloride 0.9 % 100 mL IVPB (0 g Intravenous Stopped 04/08/23 2101)  azithromycin (ZITHROMAX) 500 mg in sodium chloride 0.9 % 250 mL IVPB (0 mg Intravenous Stopped 04/08/23 2149)    ED Course/ Medical Decision Making/ A&P                                 Medical Decision Making Amount and/or Complexity of Data Reviewed Labs: ordered. Radiology: ordered.  Risk Prescription drug management. Decision regarding hospitalization.   Patient is a 77 year old female with a history of hypertension, COPD, CKD, CAD, diabetes, hyperlipidemia presenting for weakness.  On my initial evaluation, she is afebrile, hemodynamically stable, with mildly increased work of breathing but otherwise in no acute distress.  Reports 2-day history of weakness that is limited her ability to walk and care for herself.  On exam, she does have diffusely diminished breath sounds  with some expiratory wheezing.  Presentation is most  concerning for COPD exacerbation.  Given nonspecific symptoms, will obtain broad workup to evaluate for underlying infection, electrolyte abnormality, metabolic abnormality, hypothyroidism.  Results reviewed.  CBC with mild leukocytosis to 11.3, anemia present with hemoglobin 9.2.  CMP with mild elevation in creatinine to 1.23, higher than baseline.  VBG with respiratory acidosis with pH 7.26, pCO2 92.  Her bicarb is elevated to 38, consistent with compensation for chronic respiratory acidosis.  Initial troponin 81, repeat 68.  BNP is mildly elevated to 626.  UA noninfectious.  Bilateral knees are atraumatic on x-ray.  Chest x-ray with stable cardiomegaly.  Overall, concern the presentation is most consistent with COPD exacerbation with secondary heart strain.  Administered antibiotics for CAP coverage as well as Solu-Medrol.  Recommended BiPAP given her degree of acidosis however patient declined.  As her work of breathing is not significantly increased, do feel that we can trial medical management before collating respiratory support.  Feel the patient benefit from admission for further management.  Discussed with hospitalist.  Patient admitted without further acute eval under my care in the emergency department.        Final Clinical Impression(s) / ED Diagnoses Final diagnoses:  COPD exacerbation [facility])    Rx / DC Orders ED Discharge Orders     None         Claretha Cooper, DO 04/08/23 2307    Cathren Laine, MD 04/09/23 [phone]

## 2023-04-08 NOTE — Progress Notes (Signed)
Pt ordered BIPAP, I assessed patient and she does not seem to be SOB, she has received neb treatments but states that she is breathing fine. RN aware. Will notify MD.

## 2023-04-08 NOTE — ED Triage Notes (Signed)
Patient arrives from home by EMS with c/o waking up this morning and stating "my knees and me legs wouldn't work".   Patient reports made it to the bathroom but her son had to help her back to bed.  Patient reports while walking to ambulance patient reports fell on her knees.   Patient reports at baseline walks with walker.     EMS VITALS 124/68 HR 70 94% 2L [place] (baseline for patient) CBG 179

## 2023-04-09 ENCOUNTER — Inpatient Hospital Stay (HOSPITAL_COMMUNITY): Payer: Medicare Other

## 2023-04-09 DIAGNOSIS — I5023 Acute on chronic systolic (congestive) heart failure: Secondary | ICD-10-CM | POA: Diagnosis not present

## 2023-04-09 DIAGNOSIS — J441 Chronic obstructive pulmonary disease with (acute) exacerbation: Secondary | ICD-10-CM | POA: Diagnosis not present

## 2023-04-09 DIAGNOSIS — I5042 Chronic combined systolic (congestive) and diastolic (congestive) heart failure: Secondary | ICD-10-CM | POA: Diagnosis not present

## 2023-04-09 LAB — FERRITIN: Ferritin: 35 ng/mL (ref 11–307)

## 2023-04-09 LAB — CBC
HCT: 28.5 % — ABNORMAL LOW (ref 36.0–46.0)
Hemoglobin: 9 g/dL — ABNORMAL LOW (ref 12.0–15.0)
MCH: 31.9 pg (ref 26.0–34.0)
MCHC: 31.6 g/dL (ref 30.0–36.0)
MCV: 101.1 fL — ABNORMAL HIGH (ref 80.0–100.0)
Platelets: 187 10*3/uL (ref 150–400)
RBC: 2.82 MIL/uL — ABNORMAL LOW (ref 3.87–5.11)
RDW: 12.8 % (ref 11.5–15.5)
WBC: 11.4 10*3/uL — ABNORMAL HIGH (ref 4.0–10.5)
nRBC: 0 % (ref 0.0–0.2)

## 2023-04-09 LAB — BASIC METABOLIC PANEL
Anion gap: 17 — ABNORMAL HIGH (ref 5–15)
BUN: 18 mg/dL (ref 8–23)
CO2: 32 mmol/L (ref 22–32)
Calcium: 8.8 mg/dL — ABNORMAL LOW (ref 8.9–10.3)
Chloride: 89 mmol/L — ABNORMAL LOW (ref 98–111)
Creatinine, Ser: 1.24 mg/dL — ABNORMAL HIGH (ref 0.44–1.00)
GFR, Estimated: 46 mL/min — ABNORMAL LOW (ref >60)
Glucose, Bld: 263 mg/dL — ABNORMAL HIGH (ref 70–99)
Potassium: 4.7 mmol/L (ref 3.5–5.1)
Sodium: 138 mmol/L (ref 135–145)

## 2023-04-09 LAB — LIPID PANEL
Cholesterol: 188 mg/dL (ref 0–200)
HDL: 24 mg/dL — ABNORMAL LOW (ref >40)
LDL Cholesterol: 129 mg/dL — ABNORMAL HIGH (ref 0–99)
Total CHOL/HDL Ratio: 7.8 ratio
Triglycerides: 176 mg/dL — ABNORMAL HIGH (ref <150)
VLDL: 35 mg/dL (ref 0–40)

## 2023-04-09 LAB — MRSA NEXT GEN BY PCR, NASAL: MRSA by PCR Next Gen: NOT DETECTED

## 2023-04-09 LAB — RESP PANEL BY RT-PCR (RSV, FLU A&B, COVID)  RVPGX2
Influenza A by PCR: NEGATIVE
Influenza B by PCR: NEGATIVE
Resp Syncytial Virus by PCR: NEGATIVE
SARS Coronavirus 2 by RT PCR: NEGATIVE

## 2023-04-09 LAB — GLUCOSE, CAPILLARY
Glucose-Capillary: 211 mg/dL — ABNORMAL HIGH (ref 70–99)
Glucose-Capillary: 231 mg/dL — ABNORMAL HIGH (ref 70–99)
Glucose-Capillary: 242 mg/dL — ABNORMAL HIGH (ref 70–99)
Glucose-Capillary: 255 mg/dL — ABNORMAL HIGH (ref 70–99)
Glucose-Capillary: 256 mg/dL — ABNORMAL HIGH (ref 70–99)
Glucose-Capillary: 280 mg/dL — ABNORMAL HIGH (ref 70–99)

## 2023-04-09 LAB — BLOOD GAS, VENOUS
Acid-Base Excess: 15 mmol/L — ABNORMAL HIGH (ref 0.0–2.0)
Bicarbonate: 42.8 mmol/L — ABNORMAL HIGH (ref 20.0–28.0)
Drawn by: 68774
O2 Saturation: 99.5 %
Patient temperature: 37
pCO2, Ven: 66 mmHg — ABNORMAL HIGH (ref 44–60)
pH, Ven: 7.42 (ref 7.25–7.43)
pO2, Ven: 99 mmHg — ABNORMAL HIGH (ref 32–45)

## 2023-04-09 LAB — IRON AND TIBC
Iron: 36 ug/dL (ref 28–170)
Saturation Ratios: 9 % — ABNORMAL LOW (ref 10.4–31.8)
TIBC: 403 ug/dL (ref 250–450)
UIBC: 367 ug/dL

## 2023-04-09 LAB — ECHOCARDIOGRAM COMPLETE
AR max vel: 1.65 cm2
AV Area VTI: 1.56 cm2
AV Area mean vel: 1.53 cm2
AV Mean grad: 12 mmHg
AV Peak grad: 17.6 mmHg
Ao pk vel: 2.1 m/s
Area-P 1/2: 2.87 cm2
Height: 61 in
MV M vel: 4.25 m/s
MV Peak grad: 72.1 mmHg
MV VTI: 1.63 cm2
S' Lateral: 4.5 cm
Weight: 3026.47 [oz_av]

## 2023-04-09 LAB — VITAMIN B12: Vitamin B-12: 990 pg/mL — ABNORMAL HIGH (ref 180–914)

## 2023-04-09 LAB — MAGNESIUM: Magnesium: 1.5 mg/dL — ABNORMAL LOW (ref 1.7–2.4)

## 2023-04-09 LAB — TROPONIN I (HIGH SENSITIVITY): Troponin I (High Sensitivity): 50 ng/L — ABNORMAL HIGH (ref <18)

## 2023-04-09 LAB — TRANSFERRIN: Transferrin: 300 mg/dL (ref 192–382)

## 2023-04-09 LAB — PHOSPHORUS: Phosphorus: 3.1 mg/dL (ref 2.5–4.6)

## 2023-04-09 MED ORDER — PANTOPRAZOLE SODIUM 40 MG PO TBEC
40.0000 mg | DELAYED_RELEASE_TABLET | Freq: Every day | ORAL | Status: DC
  Administered 2023-04-09 – 2023-04-14 (×6): 40 mg via ORAL
  Filled 2023-04-09 (×6): qty 1

## 2023-04-09 MED ORDER — ALUM & MAG HYDROXIDE-SIMETH 200-200-20 MG/5ML PO SUSP
30.0000 mL | ORAL | Status: DC | PRN
  Administered 2023-04-09 – 2023-04-14 (×4): 30 mL via ORAL
  Filled 2023-04-09 (×4): qty 30

## 2023-04-09 MED ORDER — ARFORMOTEROL TARTRATE 15 MCG/2ML IN NEBU
15.0000 ug | INHALATION_SOLUTION | Freq: Two times a day (BID) | RESPIRATORY_TRACT | Status: DC
  Administered 2023-04-09 – 2023-04-14 (×10): 15 ug via RESPIRATORY_TRACT
  Filled 2023-04-09 (×10): qty 2

## 2023-04-09 MED ORDER — BUDESONIDE 0.25 MG/2ML IN SUSP
0.2500 mg | Freq: Two times a day (BID) | RESPIRATORY_TRACT | Status: DC
  Administered 2023-04-09 – 2023-04-14 (×10): 0.25 mg via RESPIRATORY_TRACT
  Filled 2023-04-09 (×10): qty 2

## 2023-04-09 MED ORDER — FUROSEMIDE 10 MG/ML IJ SOLN
40.0000 mg | Freq: Two times a day (BID) | INTRAMUSCULAR | Status: DC
  Administered 2023-04-09 (×2): 40 mg via INTRAVENOUS
  Filled 2023-04-09 (×2): qty 4

## 2023-04-09 MED ORDER — INSULIN ASPART 100 UNIT/ML IJ SOLN
2.0000 [IU] | Freq: Once | INTRAMUSCULAR | Status: AC
  Administered 2023-04-09: 2 [IU] via SUBCUTANEOUS

## 2023-04-09 MED ORDER — PERFLUTREN LIPID MICROSPHERE
1.0000 mL | INTRAVENOUS | Status: AC | PRN
  Administered 2023-04-09: 2 mL via INTRAVENOUS

## 2023-04-09 MED ORDER — MAGNESIUM SULFATE 2 GM/50ML IV SOLN
2.0000 g | Freq: Once | INTRAVENOUS | Status: AC
  Administered 2023-04-09: 2 g via INTRAVENOUS
  Filled 2023-04-09: qty 50

## 2023-04-09 MED ORDER — IPRATROPIUM-ALBUTEROL 0.5-2.5 (3) MG/3ML IN SOLN
3.0000 mL | Freq: Four times a day (QID) | RESPIRATORY_TRACT | Status: DC
  Administered 2023-04-09 – 2023-04-10 (×5): 3 mL via RESPIRATORY_TRACT
  Filled 2023-04-09 (×5): qty 3

## 2023-04-09 NOTE — Plan of Care (Signed)
  Problem: Education: Goal: Ability to demonstrate management of disease process will improve Outcome: Progressing   Problem: Activity: Goal: Capacity to carry out activities will improve Outcome: Progressing   Problem: Cardiac: Goal: Ability to achieve and maintain adequate cardiopulmonary perfusion will improve Outcome: Progressing   Problem: Education: Goal: Knowledge of disease or condition will improve Outcome: Progressing   Problem: Respiratory: Goal: Ability to maintain a clear airway will improve Outcome: Progressing

## 2023-04-09 NOTE — Progress Notes (Signed)
PROGRESS NOTE  Beverly Wheeler XBJ:[id number] DOB: 1945-09-07 DOA: 04/08/2023 PCP: [place], Betty G, MD   HPI/Recap of past 24 hours: Beverly Wheeler is a 77 y.o. female with hx of COPD, chronic hypoxic respiratory failure on 2 L O2, CAD, CHF, CKD 3, HTN, HLD, IDA, who presents with few days of progressive fatigue, worsening SOB, and cough. Former smoker of 40 pack years. Has been using home inhalers and diuretics as prescribed.  Patient admitted for further management.    Today, patient still noted to be dyspneic, but reports some improvement overall.  Still with some nonproductive cough, denies any chest pain, abdominal pain, nausea/vomiting, fever/chills.    Assessment/Plan: Principal Problem:   COPD exacerbation (HCC)   Acute on chronic systolic and diastolic HF Noted pulmonary hypertension BNP 626 Chest x-ray with pulmonary vascular congestion Remote echo' 19 with LVEF 35 to 40%, repeat pending Continue IV Lasix Strict I's and O's, daily weights Telemetry  COPD exacerbation Acute on chronic hypoxic and hypercarbic respiratory failure  Currently saturating well on 2 L of O2, mentation intact Patient has declined BiPAP therapy Chest x-ray with pulm vascular congestion but no infiltrates S/p IV Solu-Medrol, continue p.o. prednisone Continue ceftriaxone 1 g IV every 24 hours, azithromycin 500 mg p.o DuoNebs, Brovana, Pulmicort Incentive spirometry, flutter valve BiPAP as needed, continue supplemental O2  Hypomagnesemia Replace prn   History of CAD HLD Chest pain free EKG without acute ischemic changes High-sensitivity troponin downtrending 81->68, most consistent with demand event in setting of respiratory failure LDL 129 Continue home aspirin, atorvastatin, Zetia  CKD 3a Cr around baseline Daily BMP  History IDA Baseline hgb around 10 Continue home iron and B12   DM type 2 with hyperglycemia Last A1c 6.1 in 2023, repeat pending Home regimen is  glipizide with breakfast, metformin twice daily SSI, Accu-Cheks, hypoglycemic protocol  Mood disorder Continue home Lexapro  Obesity Lifestyle modification advised    Estimated body mass index is 35.74 kg/m as calculated from the following:   Height as of this encounter: 5\' 1"  (1.549 m).   Weight as of this encounter: 85.8 kg.     Code Status: Full  Family Communication: None at bedside  Disposition Plan: Status is: Inpatient Remains inpatient appropriate because: Level of care      Consultants: None  Procedures: None  Antimicrobials: Ceftriaxone Azithromycin  DVT prophylaxis: Lovenox   Objective: Vitals:   04/09/23 0031 04/09/23 0259 04/09/23 0500 04/09/23 0727  BP: (!) 135/55 (!) 123/48  (!) 111/48  Pulse: 81 76 71 70  Resp: 15 19 17 18   Temp: 99.7 F (37.6 C) 98.5 F (36.9 C)  98.6 F (37 C)  TempSrc: Oral Oral  Oral  SpO2: 90% 92% 92% 93%  Weight: 87.3 kg  85.8 kg   Height: 5\' 1"  (1.549 m)       Intake/Output Summary (Last 24 hours) at 04/09/2023 1158 Last data filed at 04/09/2023 0800 Gross per 24 hour  Intake 410 ml  Output 450 ml  Net -40 ml   Filed Weights   04/08/23 1643 04/09/23 0031 04/09/23 0500  Weight: 93 kg 87.3 kg 85.8 kg    Exam: General: NAD  Cardiovascular: S1, S2 present Respiratory: Bibasilar crackles noted Abdomen: Soft, nontender, nondistended, bowel sounds present Musculoskeletal: No bilateral pedal edema noted Skin: Normal Psychiatry: Normal mood     Data Reviewed: CBC: Recent Labs  Lab 04/08/23 1843 04/08/23 1852 04/08/23 2353  WBC 11.3*  --  11.4*  NEUTROABS 9.2*  --   --  HGB 9.2* 9.5* 9.0*  HCT 29.2* 28.0* 28.5*  MCV 99.0  --  101.1*  PLT 200  --  187   Basic Metabolic Panel: Recent Labs  Lab 04/08/23 1843 04/08/23 1852 04/09/23 0244  NA 140 139 138  K 4.6 4.6 4.7  CL 90*  --  89*  CO2 38*  --  32  GLUCOSE 133*  --  263*  BUN 17  --  18  CREATININE 1.23*  --  1.24*  CALCIUM 8.9  --   8.8*  MG 1.7  --  1.5*  PHOS  --   --  3.1   GFR: Estimated Creatinine Clearance: 39.6 mL/min (A) (by C-G formula based on SCr of 1.24 mg/dL (H)). Liver Function Tests: Recent Labs  Lab 04/08/23 1843  AST 14*  ALT 15  ALKPHOS 72  BILITOT 0.5  PROT 6.3*  ALBUMIN 3.1*   No results for input(s): "LIPASE", "AMYLASE" in the last 168 hours. No results for input(s): "AMMONIA" in the last 168 hours. Coagulation Profile: No results for input(s): "INR", "PROTIME" in the last 168 hours. Cardiac Enzymes: No results for input(s): "CKTOTAL", "CKMB", "CKMBINDEX", "TROPONINI" in the last 168 hours. BNP (last 3 results) No results for input(s): "PROBNP" in the last 8760 hours. HbA1C: No results for input(s): "HGBA1C" in the last 72 hours. CBG: Recent Labs  Lab 04/09/23 0044 04/09/23 0601 04/09/23 1046  GLUCAP 280* 211* 255*   Lipid Profile: Recent Labs    04/09/23 0244  CHOL 188  HDL 24*  LDLCALC 129*  TRIG 176*  CHOLHDL 7.8   Thyroid Function Tests: Recent Labs    04/08/23 1843  TSH 1.742   Anemia Panel: Recent Labs    04/09/23 0244  VITAMINB12 990*  FERRITIN 35  TIBC 403  IRON 36   Urine analysis:    Component Value Date/Time   COLORURINE YELLOW 04/08/2023 2057   APPEARANCEUR HAZY (A) 04/08/2023 2057   LABSPEC 1.015 04/08/2023 2057   PHURINE 6.0 04/08/2023 2057   GLUCOSEU NEGATIVE 04/08/2023 2057   GLUCOSEU NEGATIVE 05/26/2009 1007   HGBUR TRACE (A) 04/08/2023 2057   BILIRUBINUR NEGATIVE 04/08/2023 2057   KETONESUR NEGATIVE 04/08/2023 2057   PROTEINUR NEGATIVE 04/08/2023 2057   UROBILINOGEN 0.2 10/16/2013 1735   NITRITE NEGATIVE 04/08/2023 2057   LEUKOCYTESUR NEGATIVE 04/08/2023 2057   Sepsis Labs: @LABRCNTIP (procalcitonin:4,lacticidven:4)  ) Recent Results (from the past 240 hour(s))  Resp panel by RT-PCR (RSV, Flu A&B, Covid) Anterior Nasal Swab     Status: None   Collection Time: 04/08/23  6:42 PM   Specimen: Anterior Nasal Swab  Result Value  Ref Range Status   SARS Coronavirus 2 by RT PCR NEGATIVE NEGATIVE Final   Influenza A by PCR NEGATIVE NEGATIVE Final   Influenza B by PCR NEGATIVE NEGATIVE Final    Comment: (NOTE) The Xpert Xpress SARS-CoV-2/FLU/RSV plus assay is intended as an aid in the diagnosis of influenza from Nasopharyngeal swab specimens and should not be used as a sole basis for treatment. Nasal washings and aspirates are unacceptable for Xpert Xpress SARS-CoV-2/FLU/RSV testing.  Fact Sheet for Patients: BloggerCourse.com  Fact Sheet for Healthcare Providers: SeriousBroker.it  This test is not yet approved or cleared by the [place] FDA and has been authorized for detection and/or diagnosis of SARS-CoV-2 by FDA under an Emergency Use Authorization (EUA). This EUA will remain in effect (meaning this test can be used) for the duration of the COVID-19 declaration under Section 564(b)(1) of the Act, 21 U.S.C.  section 360bbb-3(b)(1), unless the authorization is terminated or revoked.     Resp Syncytial Virus by PCR NEGATIVE NEGATIVE Final    Comment: (NOTE) Fact Sheet for Patients: BloggerCourse.com  Fact Sheet for Healthcare Providers: SeriousBroker.it  This test is not yet approved or cleared by the [place] FDA and has been authorized for detection and/or diagnosis of SARS-CoV-2 by FDA under an Emergency Use Authorization (EUA). This EUA will remain in effect (meaning this test can be used) for the duration of the COVID-19 declaration under Section 564(b)(1) of the Act, 21 U.S.C. section 360bbb-3(b)(1), unless the authorization is terminated or revoked.  Performed at [facility], [address] [place], [place] [address]   SARS Coronavirus 2 by RT PCR (hospital order, performed in [facility] hospital lab) *cepheid single result test* Anterior Nasal Swab     Status: None    Collection Time: 04/08/23  6:43 PM   Specimen: Anterior Nasal Swab  Result Value Ref Range Status   SARS Coronavirus 2 by RT PCR NEGATIVE NEGATIVE Final    Comment: Performed at [facility], [address] [place], [place] [address]  MRSA Next Gen by PCR, Nasal     Status: None   Collection Time: 04/09/23 12:24 AM   Specimen: Nasal Mucosa; Nasal Swab  Result Value Ref Range Status   MRSA by PCR Next Gen NOT DETECTED NOT DETECTED Final    Comment: (NOTE) The GeneXpert MRSA Assay (FDA approved for NASAL specimens only), is one component of a comprehensive MRSA colonization surveillance program. It is not intended to diagnose MRSA infection nor to guide or monitor treatment for MRSA infections. Test performance is not FDA approved in patients less than 44 years old. Performed at [facility], [address] [place], [place] [address]       Studies: DG Knee Complete 4 Views Right  Result Date: 04/08/2023 CLINICAL DATA:  Larey Seat EXAM: RIGHT KNEE - COMPLETE 4+ VIEW COMPARISON:  None Available. FINDINGS: Frontal, bilateral oblique, lateral views of the right knee are obtained. No fracture, subluxation, or dislocation. Three compartmental osteoarthritis greatest in the medial and patellofemoral compartments with mild joint space narrowing and osteophyte formation. No joint effusion. Soft tissues are unremarkable. IMPRESSION: 1. Mild 3 compartmental osteoarthritis.  No acute fracture. Electronically Signed   By: Sharlet Salina M.D.   On: 04/08/2023 17:44   DG Knee Complete 4 Views Left  Result Date: 04/08/2023 CLINICAL DATA:  Larey Seat EXAM: LEFT KNEE - COMPLETE 4+ VIEW COMPARISON:  None Available. FINDINGS: Frontal, bilateral oblique, and lateral views of the left knee are obtained. No fracture, subluxation, or dislocation. 3 compartmental osteoarthritis, most pronounced within the medial and patellofemoral compartment with mild joint space narrowing and osteophyte formation. No joint  effusion. Soft tissues are unremarkable. IMPRESSION: 1. Mild 3 compartmental osteoarthritis.  No acute fracture. Electronically Signed   By: Sharlet Salina M.D.   On: 04/08/2023 17:44   DG Chest Portable 1 View  Result Date: 04/08/2023 CLINICAL DATA:  Larey Seat EXAM: PORTABLE CHEST 1 VIEW COMPARISON:  03/15/2022 FINDINGS: Single frontal view of the chest demonstrates an enlarged cardiac silhouette. Central pulmonary vascular congestion without acute airspace disease, effusion, or pneumothorax. No acute displaced fracture. IMPRESSION: 1. Stable cardiomegaly and central vascular congestion. No acute intrathoracic process. Electronically Signed   By: Sharlet Salina M.D.   On: 04/08/2023 17:43    Scheduled Meds:  aspirin EC  81 mg Oral Daily   atorvastatin  80 mg  Oral QHS   azithromycin  500 mg Oral Q24H   cyanocobalamin  1,000 mcg Oral Daily   enoxaparin (LOVENOX) injection  40 mg Subcutaneous Q24H   escitalopram  20 mg Oral Daily   ezetimibe  10 mg Oral Daily   ferrous gluconate  324 mg Oral BID WC   furosemide  40 mg Intravenous BID   insulin aspart  0-6 Units Subcutaneous TID WC   ipratropium-albuterol  3 mL Nebulization Q6H   mometasone-formoterol  2 puff Inhalation BID   montelukast  10 mg Oral QHS   nebivolol  5 mg Oral Daily   pantoprazole  40 mg Oral Daily   predniSONE  40 mg Oral Q breakfast   sodium chloride flush  3 mL Intravenous Q12H    Continuous Infusions:  cefTRIAXone (ROCEPHIN)  IV       LOS: 1 day     Briant Cedar, MD [facility]  If 7PM-7AM, please contact night-coverage [email or web address] 04/09/2023, 11:58 AM

## 2023-04-09 NOTE — Progress Notes (Signed)
  Echocardiogram 2D Echocardiogram has been performed.  Beverly Wheeler 04/09/2023, 4:59 PM

## 2023-04-09 NOTE — H&P (Addendum)
History and Physical    Beverly Wheeler KGM:[id number] DOB: 1946/07/16 DOA: 04/08/2023  PCP: [place], Betty G, MD   Patient coming from: Home   Chief Complaint:  Chief Complaint  Patient presents with   Weakness    HPI:  Beverly Wheeler is a 77 y.o. female with hx of COPD, chronic hypoxic respiratory failure on 2 L O2, coronary artery disease, MI, heart failure with reduced ejection fraction, CKD 3, hypertension, diabetes, IDA, who presents with few days of progressive fatigue, new shortness of breath and cough today.  She was actually unaware of any underlying lung disease or diagnosis of COPD despite being on chronic oxygen.  Former smoker of 40 pack years.  No recent sick contacts.  No associated chest pain.  Has been using home inhalers as prescribed.  Has been taking home diuretics as prescribed.  Reported recent dysuria   Review of Systems:  ROS complete and negative except as marked above   No Known Allergies  Prior to Admission medications   Medication Sig Start Date End Date Taking? Authorizing Provider  albuterol (VENTOLIN HFA) 108 (90 Base) MCG/ACT inhaler Inhale 1 puff every 4 hours as needed for wheezing or shortness of breath. 07/19/22  Yes [place], Betty G, MD  aspirin EC 81 MG EC tablet Take 1 tablet (81 mg total) by mouth daily. 10/21/13  Yes Barrett, Joline Salt, PA-C  atorvastatin (LIPITOR) 80 MG tablet TAKE 1 TABLET BY MOUTH EVERY DAY AT 6PM 03/24/23  Yes [place], Betty G, MD  cetirizine (ZYRTEC) 10 MG tablet Take 10 mg by mouth daily.   Yes [provider]  DULERA 200-5 MCG/ACT AERO INHALE 2 PUFFS INTO THE LUNGS TWICE A DAY 01/23/23  Yes [place], Betty G, MD  escitalopram (LEXAPRO) 20 MG tablet TAKE 1 TABLET BY MOUTH EVERY DAY 03/24/23  Yes [place], Betty G, MD  ezetimibe (ZETIA) 10 MG tablet TAKE 1 TABLET BY MOUTH EVERY DAY 03/28/23  Yes [place], Betty G, MD  ferrous gluconate (FERGON) 324 MG tablet TAKE 1 TABLET BY MOUTH TWICE A DAY WITH MEALS 08/09/19  Yes  [place], Betty G, MD  furosemide (LASIX) 40 MG tablet TAKE 1 TABLET BY MOUTH EVERY DAY 11/28/22  Yes [place], Betty G, MD  glipiZIDE (GLUCOTROL) 5 MG tablet TAKE 1 TABLET BY MOUTH DAILY BEFORE BREAKFAST. SCHEDULE APPOINTMENT FOR FUTURE REFILLS 11/30/22  Yes [place], Betty G, MD  KLOR-CON M20 20 MEQ tablet TAKE 1 TABLET BY MOUTH EVERY DAY 12/25/20  Yes [place], Betty G, MD  metFORMIN (GLUCOPHAGE) 500 MG tablet TAKE 1 TABLET BY MOUTH 2 TIMES DAILY WITH A MEAL. 10/14/22  Yes [place], Betty G, MD  montelukast (SINGULAIR) 10 MG tablet Take 1 tablet (10 mg total) by mouth at bedtime. 03/25/20  Yes [place], Betty G, MD  nebivolol (BYSTOLIC) 5 MG tablet TAKE 1 TABLET BY MOUTH EVERY DAY 10/14/22  Yes [place], Betty G, MD  nitroGLYCERIN (NITROSTAT) 0.4 MG SL tablet Place 1 tablet (0.4 mg total) under the tongue every 5 (five) minutes as needed for chest pain. 12/20/22  Yes [place], Betty G, MD  triamcinolone (NASACORT ALLERGY 24HR) 55 MCG/ACT AERO nasal inhaler Place 2 sprays into the nose daily.   Yes [provider]  vitamin B-12 1000 MCG tablet Take 1 tablet (1,000 mcg total) by mouth daily. 12/06/17  Yes Leroy Sea, MD  ACCU-CHEK GUIDE test strip USE TO CHECK BLOOD SUGARS 4 TIMES DAILY 09/30/22   [place], Betty G, MD  Accu-Chek Softclix Lancets lancets  Use to check blood sugars 1-2 times daily. Dx:e11.9 05/04/20   [place], Betty G, MD  Blood Glucose Monitoring Suppl (ACCU-CHEK AVIVA PLUS) w/Device KIT Use to test blood sugars 1-2 times daily. Dx: E11.9 05/04/20   [place], Betty G, MD    Past Medical History:  Diagnosis Date   Allergy    Asthma    CAD (coronary artery disease)    COPD (chronic obstructive pulmonary disease) (HCC)    Depression    GERD (gastroesophageal reflux disease)    Hyperlipidemia    Hypertension    Myocardial infarction (HCC) 10/16/2013    Past Surgical History:  Procedure Laterality Date   CARDIAC CATHETERIZATION  10/17/13   significant 3 vessel disease   LEFT HEART  CATHETERIZATION WITH CORONARY ANGIOGRAM N/A 10/17/2013   Procedure: LEFT HEART CATHETERIZATION WITH CORONARY ANGIOGRAM;  Surgeon: Lesleigh Noe, MD;  Location: [facility] CATH LAB;  Service: Cardiovascular;  Laterality: N/A;     reports that she quit smoking about 9 years ago. Her smoking use included cigarettes. She started smoking about 54 years ago. She has a 45 pack-year smoking history. She has never used smokeless tobacco. She reports that she does not currently use alcohol. She reports that she does not use drugs.  Family History  Problem Relation Age of Onset   Allergies Mother    Breast cancer Mother    Arthritis Mother    Cancer Mother    Hyperlipidemia Mother    Hypertension Mother    Heart disease Father    Heart disease Brother    Arthritis Brother    Cancer Brother    Depression Brother    Drug abuse Brother    Hyperlipidemia Brother    Heart attack Brother    Rheum arthritis Brother    Cancer Sister    Dementia Sister      Physical Exam: Vitals:   04/08/23 2230 04/08/23 2315 04/08/23 2345 04/09/23 0031  BP: 127/85 (!) 136/49 (!) 131/54 (!) 135/55  Pulse: 92 85 83 81  Resp: (!) 24 15 16 15   Temp:    99.7 F (37.6 C)  TempSrc:    Oral  SpO2: 95% 94% 92% 90%  Weight:    87.3 kg  Height:    5\' 1"  (1.549 m)    Gen: Awake, alert, NAD, chronically ill-appearing CV: Regular, normal S1, S2, 1/6 SEM Resp: Tachypneic, on 2 L O2, very diminished breath sounds, with poor air movement minimal wheezing in that setting Abd: Obese normoactive, nontender MSK: Symmetric, trace pitting edema near the ankles Skin: No rashes or lesions to exposed skin  Neuro: Alert and interactive, fully oriented Psych: euthymic, appropriate, insight is poor   Data review:   Labs reviewed, notable for:    VBG pH 7.26/CO2 92 bicarb 38 Creatinine 1.2, baseline 1 WBC 11 Hemoglobin 9 Trope 81 down to 68 BNP 626 UA not consistent with infection, appears contaminated  Micro:  Results  for orders placed or performed during the hospital encounter of 04/08/23  Resp panel by RT-PCR (RSV, Flu A&B, Covid) Anterior Nasal Swab     Status: None   Collection Time: 04/08/23  6:42 PM   Specimen: Anterior Nasal Swab  Result Value Ref Range Status   SARS Coronavirus 2 by RT PCR NEGATIVE NEGATIVE Final   Influenza A by PCR NEGATIVE NEGATIVE Final   Influenza B by PCR NEGATIVE NEGATIVE Final    Comment: (NOTE) The Xpert Xpress SARS-CoV-2/FLU/RSV plus assay is intended as an aid  in the diagnosis of influenza from Nasopharyngeal swab specimens and should not be used as a sole basis for treatment. Nasal washings and aspirates are unacceptable for Xpert Xpress SARS-CoV-2/FLU/RSV testing.  Fact Sheet for Patients: BloggerCourse.com  Fact Sheet for Healthcare Providers: SeriousBroker.it  This test is not yet approved or cleared by the [place] FDA and has been authorized for detection and/or diagnosis of SARS-CoV-2 by FDA under an Emergency Use Authorization (EUA). This EUA will remain in effect (meaning this test can be used) for the duration of the COVID-19 declaration under Section 564(b)(1) of the Act, 21 U.S.C. section 360bbb-3(b)(1), unless the authorization is terminated or revoked.     Resp Syncytial Virus by PCR NEGATIVE NEGATIVE Final    Comment: (NOTE) Fact Sheet for Patients: BloggerCourse.com  Fact Sheet for Healthcare Providers: SeriousBroker.it  This test is not yet approved or cleared by the [place] FDA and has been authorized for detection and/or diagnosis of SARS-CoV-2 by FDA under an Emergency Use Authorization (EUA). This EUA will remain in effect (meaning this test can be used) for the duration of the COVID-19 declaration under Section 564(b)(1) of the Act, 21 U.S.C. section 360bbb-3(b)(1), unless the authorization is terminated  or revoked.  Performed at [facility] Lab, [address], [place], [place] [address]   SARS Coronavirus 2 by RT PCR (hospital order, performed in [facility] hospital lab) *cepheid single result test* Anterior Nasal Swab     Status: None   Collection Time: 04/08/23  6:43 PM   Specimen: Anterior Nasal Swab  Result Value Ref Range Status   SARS Coronavirus 2 by RT PCR NEGATIVE NEGATIVE Final    Comment: Performed at [facility], [address] [place], [place] [address]  MRSA Next Gen by PCR, Nasal     Status: None   Collection Time: 04/09/23 12:24 AM   Specimen: Nasal Mucosa; Nasal Swab  Result Value Ref Range Status   MRSA by PCR Next Gen NOT DETECTED NOT DETECTED Final    Comment: (NOTE) The GeneXpert MRSA Assay (FDA approved for NASAL specimens only), is one component of a comprehensive MRSA colonization surveillance program. It is not intended to diagnose MRSA infection nor to guide or monitor treatment for MRSA infections. Test performance is not FDA approved in patients less than 78 years old. Performed at [facility], [address], [place], [place] [address]     Imaging reviewed:  DG Knee Complete 4 Views Right  Result Date: 04/08/2023 CLINICAL DATA:  Larey Seat EXAM: RIGHT KNEE - COMPLETE 4+ VIEW COMPARISON:  None Available. FINDINGS: Frontal, bilateral oblique, lateral views of the right knee are obtained. No fracture, subluxation, or dislocation. Three compartmental osteoarthritis greatest in the medial and patellofemoral compartments with mild joint space narrowing and osteophyte formation. No joint effusion. Soft tissues are unremarkable. IMPRESSION: 1. Mild 3 compartmental osteoarthritis.  No acute fracture. Electronically Signed   By: Sharlet Salina M.D.   On: 04/08/2023 17:44   DG Knee Complete 4 Views Left  Result Date: 04/08/2023 CLINICAL DATA:  Larey Seat EXAM: LEFT KNEE - COMPLETE 4+ VIEW COMPARISON:  None Available. FINDINGS: Frontal, bilateral oblique,  and lateral views of the left knee are obtained. No fracture, subluxation, or dislocation. 3 compartmental osteoarthritis, most pronounced within the medial and patellofemoral compartment with mild joint space narrowing and osteophyte formation. No joint effusion. Soft tissues are unremarkable. IMPRESSION: 1. Mild 3 compartmental osteoarthritis.  No acute fracture. Electronically Signed   By: Casimiro Needle  Manson Passey M.D.   On: 04/08/2023 17:44   DG Chest Portable 1 View  Result Date: 04/08/2023 CLINICAL DATA:  Larey Seat EXAM: PORTABLE CHEST 1 VIEW COMPARISON:  03/15/2022 FINDINGS: Single frontal view of the chest demonstrates an enlarged cardiac silhouette. Central pulmonary vascular congestion without acute airspace disease, effusion, or pneumothorax. No acute displaced fracture. IMPRESSION: 1. Stable cardiomegaly and central vascular congestion. No acute intrathoracic process. Electronically Signed   By: Sharlet Salina M.D.   On: 04/08/2023 17:43    EKG: Reviewed, sinus rhythm, poor R wave progression, question repolarization changes in lateral leads   ED Course:  Was offered BiPAP due to hypercarbia, however patient declined and fully oriented.  Treated with nebs, methylprednisolone 125 mg IV, ceftriaxone, azithromycin.   Assessment/Plan:  77 y.o. female with hx of COPD, chronic hypoxic respiratory failure on 2 L O2, coronary artery disease, MI, heart failure with reduced ejection fraction, CKD 3, hypertension, diabetes, IDA, presents with few days of fatigue, acute respiratory symptoms. overall concern for COPD exacerbation.  COPD exacerbation Acute on chronic hypercarbic respiratory failure  Chronic hypoxic respiratory failure 1 day of SOB and productive cough. On 2L O2 at time of admission. Initial VBG with incomplete metabolic compensation for hypercarbia. However mental status is normal, and patient has declined BiPAP therapy.  Chest x-ray with pulm vascular congestion but no infiltrates. - Repeat VBG  to reevaluate hypercarbia  - S/p methylprednisolone 125 mg IV in ED.  Start prednisone 40 mg p.o. daily to complete 5-day course -Continue ceftriaxone 1 g IV every 24 hours, azithromycin 500 mg p.o. -DuoNebs scheduled every 4 hours, albuterol every 2 hours as needed, continue home Dulera -Incentive spirometry, encourage out of bed to chair -Progressive level of care due to her partially uncompensated hypercarbia  Suspect mild exacerbation, heart failure with reduced ejection fraction Remote echo' 19 with LVEF 35 to 40%.  Has pulm vascular congestion, worsening of her respiratory failure, BNP 626.  On Lasix 40 mg p.o. daily at home -Diuresis with Lasix 40 mg IV x 1 -On nebivolol 5 mg daily, needs to be started on additional GDMT -TTE -I/O, daily weights, heart failure order set placed  Acute myocardial injury History CAD, MI EKG without acute ischemic changes, suspect repolarization abnormality in lateral leads.  High-sensitivity troponin downtrending 81-68.  Most consistent with demand event in setting of respiratory failure. -Continue home aspirin, atorvastatin, Zetia -Check A1c, lipid panel  Acute on chronic anemia, borderline macrocytic History IDA -Check iron panel, B12.  -Continue home iron and B12  DM type II, with hyperglycemia Home regimen is glipizide with breakfast, metformin twice daily -SSI, check A1c per above  Chronic medical problems: CKD stage III: Creatinine near baseline of 1 Mood disorder: Continue home Lexapro  Body mass index is 36.36 kg/m.  Obesity class II  DVT prophylaxis:  Lovenox Code Status:  Full Code Diet:  Diet Orders (From admission, onward)     Start     Ordered   04/08/23 2336  Diet Carb Modified Fluid consistency: Thin; Room service appropriate? Yes  Diet effective now       Question Answer Comment  Diet-HS Snack? Nothing   Calorie Level Medium 1600-2000   Fluid consistency: Thin   Room service appropriate? Yes      04/08/23 2337            Family Communication: No Consults: None Admission status:   Inpatient, Step Down Unit  Severity of Illness: The appropriate patient status for this patient is  INPATIENT. Inpatient status is judged to be reasonable and necessary in order to provide the required intensity of service to ensure the patient's safety. The patient's presenting symptoms, physical exam findings, and initial radiographic and laboratory data in the context of their chronic comorbidities is felt to place them at high risk for further clinical deterioration. Furthermore, it is not anticipated that the patient will be medically stable for discharge from the hospital within 2 midnights of admission.   * I certify that at the point of admission it is my clinical judgment that the patient will require inpatient hospital care spanning beyond 2 midnights from the point of admission due to high intensity of service, high risk for further deterioration and high frequency of surveillance required.*   Dolly Rias, MD [facility]  How to contact the [facility] Attending or Consulting provider 7A - 7P or covering provider during after hours 7P -7A, for this patient.  Check the care team in [facility] and look for a) attending/consulting TR[facility] provider listed and b) the [facility] team listed Log into [email or web address] and use [place]'s universal password to access. If you do not have the password, please contact the hospital operator. Locate the [facility] provider you are looking for under [facility] and page to a number that you can be directly reached. If you still have difficulty reaching the provider, please page the [facility] (Director on Call) for the Hospitalists listed on amion for assistance.  04/09/2023, 2:19 AM

## 2023-04-10 ENCOUNTER — Ambulatory Visit: Payer: Medicare Other | Admitting: Cardiovascular Disease

## 2023-04-10 DIAGNOSIS — I255 Ischemic cardiomyopathy: Secondary | ICD-10-CM

## 2023-04-10 DIAGNOSIS — J9611 Chronic respiratory failure with hypoxia: Secondary | ICD-10-CM | POA: Diagnosis not present

## 2023-04-10 DIAGNOSIS — I5042 Chronic combined systolic (congestive) and diastolic (congestive) heart failure: Secondary | ICD-10-CM | POA: Diagnosis not present

## 2023-04-10 DIAGNOSIS — J441 Chronic obstructive pulmonary disease with (acute) exacerbation: Secondary | ICD-10-CM

## 2023-04-10 DIAGNOSIS — I2510 Atherosclerotic heart disease of native coronary artery without angina pectoris: Secondary | ICD-10-CM

## 2023-04-10 LAB — BASIC METABOLIC PANEL
Anion gap: 11 (ref 5–15)
BUN: 26 mg/dL — ABNORMAL HIGH (ref 8–23)
CO2: 35 mmol/L — ABNORMAL HIGH (ref 22–32)
Calcium: 8.1 mg/dL — ABNORMAL LOW (ref 8.9–10.3)
Chloride: 88 mmol/L — ABNORMAL LOW (ref 98–111)
Creatinine, Ser: 1.68 mg/dL — ABNORMAL HIGH (ref 0.44–1.00)
GFR, Estimated: 32 mL/min — ABNORMAL LOW (ref >60)
Glucose, Bld: 177 mg/dL — ABNORMAL HIGH (ref 70–99)
Potassium: 3.9 mmol/L (ref 3.5–5.1)
Sodium: 134 mmol/L — ABNORMAL LOW (ref 135–145)

## 2023-04-10 LAB — CBC WITH DIFFERENTIAL/PLATELET
Abs Immature Granulocytes: 0.09 10*3/uL — ABNORMAL HIGH (ref 0.00–0.07)
Basophils Absolute: 0 10*3/uL (ref 0.0–0.1)
Basophils Relative: 0 %
Eosinophils Absolute: 0 10*3/uL (ref 0.0–0.5)
Eosinophils Relative: 0 %
HCT: 24.4 % — ABNORMAL LOW (ref 36.0–46.0)
Hemoglobin: 8.2 g/dL — ABNORMAL LOW (ref 12.0–15.0)
Immature Granulocytes: 1 %
Lymphocytes Relative: 11 %
Lymphs Abs: 1.3 10*3/uL (ref 0.7–4.0)
MCH: 32.5 pg (ref 26.0–34.0)
MCHC: 33.6 g/dL (ref 30.0–36.0)
MCV: 96.8 fL (ref 80.0–100.0)
Monocytes Absolute: 0.8 10*3/uL (ref 0.1–1.0)
Monocytes Relative: 7 %
Neutro Abs: 8.9 10*3/uL — ABNORMAL HIGH (ref 1.7–7.7)
Neutrophils Relative %: 81 %
Platelets: 185 10*3/uL (ref 150–400)
RBC: 2.52 MIL/uL — ABNORMAL LOW (ref 3.87–5.11)
RDW: 12.5 % (ref 11.5–15.5)
WBC: 11 10*3/uL — ABNORMAL HIGH (ref 4.0–10.5)
nRBC: 0 % (ref 0.0–0.2)

## 2023-04-10 LAB — GLUCOSE, CAPILLARY
Glucose-Capillary: 158 mg/dL — ABNORMAL HIGH (ref 70–99)
Glucose-Capillary: 154 mg/dL — ABNORMAL HIGH (ref 70–99)
Glucose-Capillary: 217 mg/dL — ABNORMAL HIGH (ref 70–99)
Glucose-Capillary: 219 mg/dL — ABNORMAL HIGH (ref 70–99)

## 2023-04-10 LAB — MAGNESIUM: Magnesium: 1.9 mg/dL (ref 1.7–2.4)

## 2023-04-10 LAB — PROCALCITONIN: Procalcitonin: <0.1 ng/mL

## 2023-04-10 MED ORDER — ALBUTEROL SULFATE (2.5 MG/3ML) 0.083% IN NEBU: 2.5000 mg | INHALATION_SOLUTION | Freq: Four times a day (QID) | RESPIRATORY_TRACT | Status: DC | PRN

## 2023-04-10 MED ORDER — ENOXAPARIN SODIUM 30 MG/0.3ML IJ SOSY: 30.0000 mg | PREFILLED_SYRINGE | INTRAMUSCULAR | Status: DC

## 2023-04-10 NOTE — Progress Notes (Signed)
   Heart Failure Stewardship Pharmacist Progress Note   PCP: [place], Betty G, MD PCP-Cardiologist: Thurmon Fair, MD    HPI:  77 yo F with PMH of CAD, CHF, HTN, HLD, diabetes, CKD III, and COPD.  In 2015, admitted for STEMI. Underwent cath that showed significant 3 vessel disease. EF 30-35%. She declined CABG. ECHO 11/2017 showed EF 35-40%.   Admitted on 9/14 with weakness and shortness of breath. CXR with pulmonary vascular congestion. BNP elevated. ECHO on 9/15 showed LVEF reduced further to 25-30%, global hypokinesis, G2DD, RV normal, mild to moderate MR, mild AS. She is being treated for COPD and CHF exacerbation.   Current HF Medications: None  Prior to admission HF Medications: Diuretic: furosemide 40 mg daily  Pertinent Lab Values: Serum creatinine 1.68, BUN 26, Potassium 3.9, Sodium 134, BNP 626.5, Magnesium 1.9  Vital Signs: Weight: 197 lbs (admission weight: 189 lbs) Blood pressure: 130/50s  Heart rate: 60-70s  I/O: net -1.2L yesterday; net -1.4L since admission  Medication Assistance / Insurance Benefits Check: Does the patient have prescription insurance?  Yes Type of insurance plan: UHC Medicare + [facility]  Outpatient Pharmacy:  Prior to admission [facility] Is the patient willing to use [facility] TOC pharmacy at discharge? Yes Is the patient willing to transition their outpatient pharmacy to utilize a [facility] outpatient pharmacy?   Pending    Assessment: 1. Acute on chronic systolic CHF (LVEF 25-30%), due to ICM. NYHA class II symptoms. - Holding IV lasix with AKI today. Creatinine 1.24>1.68. Strict I/Os and daily weights. Keep K>4 and Mg>2. - BB limited with lung disease, on nebivolol - Consider starting ARB prior to discharge pending improvement in creatinine - Poor candidate for SGLT2i - GDMT limited in the setting of chronic respiratory failure, renal disease, and poor hygiene.    Plan: 1) Medication changes recommended at this time: - None  pending improvement in creatinine   2) Patient assistance: - None pending, has Medicare + [employer]  3)  Education  - To be completed prior to discharge  Sharen Hones, PharmD, BCPS Heart Failure Stewardship Pharmacist Phone [phone]

## 2023-04-10 NOTE — Plan of Care (Signed)
  Problem: Activity: Goal: Capacity to carry out activities will improve Outcome: Progressing   Problem: Cardiac: Goal: Ability to achieve and maintain adequate cardiopulmonary perfusion will improve Outcome: Progressing   Problem: Education: Goal: Knowledge of disease or condition will improve Outcome: Progressing Goal: Knowledge of the prescribed therapeutic regimen will improve Outcome: Progressing Goal: Individualized Educational Video(s) Outcome: Progressing   Problem: Respiratory: Goal: Ability to maintain a clear airway will improve Outcome: Progressing Goal: Levels of oxygenation will improve Outcome: Progressing Goal: Ability to maintain adequate ventilation will improve Outcome: Progressing   Problem: Coping: Goal: Ability to adjust to condition or change in health will improve Outcome: Progressing   Problem: Fluid Volume: Goal: Ability to maintain a balanced intake and output will improve Outcome: Progressing   Problem: Health Behavior/Discharge Planning: Goal: Ability to identify and utilize available resources and services will improve Outcome: Progressing Goal: Ability to manage health-related needs will improve Outcome: Progressing   Problem: Metabolic: Goal: Ability to maintain appropriate glucose levels will improve Outcome: Progressing   Problem: Nutritional: Goal: Maintenance of adequate nutrition will improve Outcome: Progressing Goal: Progress toward achieving an optimal weight will improve Outcome: Progressing   Problem: Skin Integrity: Goal: Risk for impaired skin integrity will decrease Outcome: Progressing   Problem: Tissue Perfusion: Goal: Adequacy of tissue perfusion will improve Outcome: Progressing   Problem: Clinical Measurements: Goal: Ability to maintain clinical measurements within normal limits will improve Outcome: Progressing Goal: Will remain free from infection Outcome: Progressing Goal: Diagnostic test results will  improve Outcome: Progressing Goal: Respiratory complications will improve Outcome: Progressing Goal: Cardiovascular complication will be avoided Outcome: Progressing

## 2023-04-10 NOTE — Plan of Care (Signed)
  Problem: Education: Goal: Knowledge of disease or condition will improve Outcome: Progressing   Problem: Respiratory: Goal: Levels of oxygenation will improve Outcome: Progressing   Problem: Skin Integrity: Goal: Risk for impaired skin integrity will decrease Outcome: Progressing

## 2023-04-10 NOTE — Consult Note (Signed)
Cardiology Consultation   Patient ID: Beverly Wheeler MRN: [id number]; DOB: 1945/11/11  Admit date: 04/08/2023 Date of Consult: 04/10/2023  PCP:  [place], Betty G, MD   [facility] Providers Cardiologist:  Thurmon Fair, MD     Patient Profile:   Beverly Wheeler is a 77 y.o. female with a hx of MI/CAD multivessel (previously recommended for bypass surgery), HFrEF/ICM, hypertension, hyperlipidemia, COPD who is being seen 04/10/2023 for the evaluation of CHF at the request of Dr. Sharolyn Douglas.  History of Present Illness:   Beverly Wheeler is a 77 year old female with past medical history noted above.  She initially presented in March 2015 with a STEMI and underwent cardiac catheterization which showed significant three-vessel disease.  LV gram showed LVEF of 30 to 35%, moderate to severe inferior wall hypokinesis, moderate anterior wall hypokinesis. It was felt there would be a low likelihood of success or benefit from percutaneous intervention.  LAD could be revascularized but there was little viability in the anterior wall, RCA not amendable to PCI and circumflex stenosis would be challenging due to large side branch at the location of large occlusion.  She was evaluated by CT surgery with recommendations to undergo bypass but she ultimately declined.   Echocardiogram 11/2017 with LVEF of 35 to 40%, septal apical and inferior wall hypokinesis.   She followed with pulmonology, Dr. Sherene Sires, wears home O2 at 2L at all times.  Currently lives at home with her son who helps are with ADLs.  Currently follows with her PCP.  States she takes Lasix 40 mg daily.  Uses a walker to get around.  Noted over the past several days she had become increasingly fatigued and short of breath with productive cough.  Denies any chest pain prior to admission.  In the ED her labs showed sodium 140, potassium 4.6, creatinine 1.23, albumin 3.1, BNP 626, high-sensitivity troponin 81>> 68>> 50, WBC 11.3,  hemoglobin 9.2, TSH 1.7.  Chest x-ray with cardiomegaly and vascular congestion.  EKG shows sinus rhythm, 71 bpm, nonspecific changes.  She was given IV Lasix, steroids and antibiotics in the ED.  Admitted to internal medicine for further evaluation.  Cardiology asked to evaluate in regards to CHF.  Echocardiogram this admission with LVEF of 25 to 30%, global hypokinesis, grade 2 diastolic dysfunction, normal RV, moderately reduced LA, mildly reduced RA, mild to moderate MR.  Worsening inferior and inferior lateral hypokinesis.   Past Medical History:  Diagnosis Date   Allergy    Asthma    CAD (coronary artery disease)    COPD (chronic obstructive pulmonary disease) (HCC)    Depression    GERD (gastroesophageal reflux disease)    Hyperlipidemia    Hypertension    Myocardial infarction (HCC) 10/16/2013    Past Surgical History:  Procedure Laterality Date   CARDIAC CATHETERIZATION  10/17/13   significant 3 vessel disease   LEFT HEART CATHETERIZATION WITH CORONARY ANGIOGRAM N/A 10/17/2013   Procedure: LEFT HEART CATHETERIZATION WITH CORONARY ANGIOGRAM;  Surgeon: Lesleigh Noe, MD;  Location: [facility] CATH LAB;  Service: Cardiovascular;  Laterality: N/A;    Inpatient Medications: Scheduled Meds:  arformoterol  15 mcg Nebulization BID   aspirin EC  81 mg Oral Daily   atorvastatin  80 mg Oral QHS   azithromycin  500 mg Oral Q24H   budesonide (PULMICORT) nebulizer solution  0.25 mg Nebulization BID   cyanocobalamin  1,000 mcg Oral Daily   [START ON 04/11/2023] enoxaparin (LOVENOX) injection  30 mg  46* 32*  ANIONGAP 12  --  17* 11    Recent Labs  Lab 04/08/23 1843  PROT 6.3*  ALBUMIN 3.1*  AST 14*  ALT 15  ALKPHOS 72  BILITOT 0.5   Lipids  Recent Labs  Lab 04/09/23 0244  CHOL 188  TRIG 176*  HDL 24*  LDLCALC 129*  CHOLHDL 7.8    Hematology Recent Labs  Lab 04/08/23 1843 04/08/23 1852 04/08/23 2353 04/10/23 0222  WBC 11.3*  --  11.4* 11.0*  RBC 2.95*  --  2.82* 2.52*   HGB 9.2* 9.5* 9.0* 8.2*  HCT 29.2* 28.0* 28.5* 24.4*  MCV 99.0  --  101.1* 96.8  MCH 31.2  --  31.9 32.5  MCHC 31.5  --  31.6 33.6  RDW 12.6  --  12.8 12.5  PLT 200  --  187 185   Thyroid  Recent Labs  Lab 04/08/23 1843  TSH 1.742    BNP Recent Labs  Lab 04/08/23 1843  BNP 626.5*    DDimer No results for input(s): "DDIMER" in the last 168 hours.   Radiology/Studies:  ECHOCARDIOGRAM COMPLETE  Result Date: 04/09/2023    ECHOCARDIOGRAM REPORT   Patient Name:   Beverly Wheeler Date of Exam: 04/09/2023 Medical Rec #:  [id number]       Height:       61.0 in Accession #:    [id number]      Weight:       189.2 lb Date of Birth:  01-15-1946       BSA:          1.845 m Patient Age:    76 years        BP:           124/45 mmHg Patient Gender: F               HR:           81 bpm. Exam Location:  Inpatient Procedure: 2D Echo, Cardiac Doppler, Color Doppler and Intracardiac            Opacification Agent Indications:    CHF  History:        Patient has prior history of Echocardiogram examinations, most                 recent 11/24/2017. Previous Myocardial Infarction and CAD, COPD;                 Risk Factors:Hypertension, Dyslipidemia and Former Smoker. At                 home oxygen therapy.  Sonographer:    Milda Smart Referring Phys: Dolly Rias  Sonographer Comments: Image acquisition challenging due to patient body habitus, Image acquisition challenging due to respiratory motion and Image acquisition challenging due to COPD. IMPRESSIONS  1. There is no left ventricular thrombus (Definity contrast was used). The apical myocardium is thin and hyperechoic, consistent with scar. Left ventricular ejection fraction, by estimation, is 25 to 30%. The left ventricle has severely decreased function. The left ventricle demonstrates global hypokinesis. The left ventricular internal cavity size was moderately dilated. Left ventricular diastolic parameters are consistent with Grade II diastolic  dysfunction (pseudonormalization). Elevated left atrial pressure. There is akinesis of the left ventricular, mid-apical anteroseptal wall, anterior wall and apical segment. There is severe hypokinesis of the left ventricular, entire inferior wall and inferolateral wall.  2. Right ventricular systolic function is normal. The right ventricular size is normal.  46* 32*  ANIONGAP 12  --  17* 11    Recent Labs  Lab 04/08/23 1843  PROT 6.3*  ALBUMIN 3.1*  AST 14*  ALT 15  ALKPHOS 72  BILITOT 0.5   Lipids  Recent Labs  Lab 04/09/23 0244  CHOL 188  TRIG 176*  HDL 24*  LDLCALC 129*  CHOLHDL 7.8    Hematology Recent Labs  Lab 04/08/23 1843 04/08/23 1852 04/08/23 2353 04/10/23 0222  WBC 11.3*  --  11.4* 11.0*  RBC 2.95*  --  2.82* 2.52*   HGB 9.2* 9.5* 9.0* 8.2*  HCT 29.2* 28.0* 28.5* 24.4*  MCV 99.0  --  101.1* 96.8  MCH 31.2  --  31.9 32.5  MCHC 31.5  --  31.6 33.6  RDW 12.6  --  12.8 12.5  PLT 200  --  187 185   Thyroid  Recent Labs  Lab 04/08/23 1843  TSH 1.742    BNP Recent Labs  Lab 04/08/23 1843  BNP 626.5*    DDimer No results for input(s): "DDIMER" in the last 168 hours.   Radiology/Studies:  ECHOCARDIOGRAM COMPLETE  Result Date: 04/09/2023    ECHOCARDIOGRAM REPORT   Patient Name:   Beverly Wheeler Date of Exam: 04/09/2023 Medical Rec #:  [id number]       Height:       61.0 in Accession #:    [id number]      Weight:       189.2 lb Date of Birth:  01-15-1946       BSA:          1.845 m Patient Age:    76 years        BP:           124/45 mmHg Patient Gender: F               HR:           81 bpm. Exam Location:  Inpatient Procedure: 2D Echo, Cardiac Doppler, Color Doppler and Intracardiac            Opacification Agent Indications:    CHF  History:        Patient has prior history of Echocardiogram examinations, most                 recent 11/24/2017. Previous Myocardial Infarction and CAD, COPD;                 Risk Factors:Hypertension, Dyslipidemia and Former Smoker. At                 home oxygen therapy.  Sonographer:    Milda Smart Referring Phys: Dolly Rias  Sonographer Comments: Image acquisition challenging due to patient body habitus, Image acquisition challenging due to respiratory motion and Image acquisition challenging due to COPD. IMPRESSIONS  1. There is no left ventricular thrombus (Definity contrast was used). The apical myocardium is thin and hyperechoic, consistent with scar. Left ventricular ejection fraction, by estimation, is 25 to 30%. The left ventricle has severely decreased function. The left ventricle demonstrates global hypokinesis. The left ventricular internal cavity size was moderately dilated. Left ventricular diastolic parameters are consistent with Grade II diastolic  dysfunction (pseudonormalization). Elevated left atrial pressure. There is akinesis of the left ventricular, mid-apical anteroseptal wall, anterior wall and apical segment. There is severe hypokinesis of the left ventricular, entire inferior wall and inferolateral wall.  2. Right ventricular systolic function is normal. The right ventricular size is normal.  Cardiology Consultation   Patient ID: Beverly Wheeler MRN: [id number]; DOB: 1945/11/11  Admit date: 04/08/2023 Date of Consult: 04/10/2023  PCP:  [place], Betty G, MD   [facility] Providers Cardiologist:  Thurmon Fair, MD     Patient Profile:   Beverly Wheeler is a 77 y.o. female with a hx of MI/CAD multivessel (previously recommended for bypass surgery), HFrEF/ICM, hypertension, hyperlipidemia, COPD who is being seen 04/10/2023 for the evaluation of CHF at the request of Dr. Sharolyn Douglas.  History of Present Illness:   Beverly Wheeler is a 77 year old female with past medical history noted above.  She initially presented in March 2015 with a STEMI and underwent cardiac catheterization which showed significant three-vessel disease.  LV gram showed LVEF of 30 to 35%, moderate to severe inferior wall hypokinesis, moderate anterior wall hypokinesis. It was felt there would be a low likelihood of success or benefit from percutaneous intervention.  LAD could be revascularized but there was little viability in the anterior wall, RCA not amendable to PCI and circumflex stenosis would be challenging due to large side branch at the location of large occlusion.  She was evaluated by CT surgery with recommendations to undergo bypass but she ultimately declined.   Echocardiogram 11/2017 with LVEF of 35 to 40%, septal apical and inferior wall hypokinesis.   She followed with pulmonology, Dr. Sherene Sires, wears home O2 at 2L at all times.  Currently lives at home with her son who helps are with ADLs.  Currently follows with her PCP.  States she takes Lasix 40 mg daily.  Uses a walker to get around.  Noted over the past several days she had become increasingly fatigued and short of breath with productive cough.  Denies any chest pain prior to admission.  In the ED her labs showed sodium 140, potassium 4.6, creatinine 1.23, albumin 3.1, BNP 626, high-sensitivity troponin 81>> 68>> 50, WBC 11.3,  hemoglobin 9.2, TSH 1.7.  Chest x-ray with cardiomegaly and vascular congestion.  EKG shows sinus rhythm, 71 bpm, nonspecific changes.  She was given IV Lasix, steroids and antibiotics in the ED.  Admitted to internal medicine for further evaluation.  Cardiology asked to evaluate in regards to CHF.  Echocardiogram this admission with LVEF of 25 to 30%, global hypokinesis, grade 2 diastolic dysfunction, normal RV, moderately reduced LA, mildly reduced RA, mild to moderate MR.  Worsening inferior and inferior lateral hypokinesis.   Past Medical History:  Diagnosis Date   Allergy    Asthma    CAD (coronary artery disease)    COPD (chronic obstructive pulmonary disease) (HCC)    Depression    GERD (gastroesophageal reflux disease)    Hyperlipidemia    Hypertension    Myocardial infarction (HCC) 10/16/2013    Past Surgical History:  Procedure Laterality Date   CARDIAC CATHETERIZATION  10/17/13   significant 3 vessel disease   LEFT HEART CATHETERIZATION WITH CORONARY ANGIOGRAM N/A 10/17/2013   Procedure: LEFT HEART CATHETERIZATION WITH CORONARY ANGIOGRAM;  Surgeon: Lesleigh Noe, MD;  Location: [facility] CATH LAB;  Service: Cardiovascular;  Laterality: N/A;    Inpatient Medications: Scheduled Meds:  arformoterol  15 mcg Nebulization BID   aspirin EC  81 mg Oral Daily   atorvastatin  80 mg Oral QHS   azithromycin  500 mg Oral Q24H   budesonide (PULMICORT) nebulizer solution  0.25 mg Nebulization BID   cyanocobalamin  1,000 mcg Oral Daily   [START ON 04/11/2023] enoxaparin (LOVENOX) injection  30 mg  46* 32*  ANIONGAP 12  --  17* 11    Recent Labs  Lab 04/08/23 1843  PROT 6.3*  ALBUMIN 3.1*  AST 14*  ALT 15  ALKPHOS 72  BILITOT 0.5   Lipids  Recent Labs  Lab 04/09/23 0244  CHOL 188  TRIG 176*  HDL 24*  LDLCALC 129*  CHOLHDL 7.8    Hematology Recent Labs  Lab 04/08/23 1843 04/08/23 1852 04/08/23 2353 04/10/23 0222  WBC 11.3*  --  11.4* 11.0*  RBC 2.95*  --  2.82* 2.52*   HGB 9.2* 9.5* 9.0* 8.2*  HCT 29.2* 28.0* 28.5* 24.4*  MCV 99.0  --  101.1* 96.8  MCH 31.2  --  31.9 32.5  MCHC 31.5  --  31.6 33.6  RDW 12.6  --  12.8 12.5  PLT 200  --  187 185   Thyroid  Recent Labs  Lab 04/08/23 1843  TSH 1.742    BNP Recent Labs  Lab 04/08/23 1843  BNP 626.5*    DDimer No results for input(s): "DDIMER" in the last 168 hours.   Radiology/Studies:  ECHOCARDIOGRAM COMPLETE  Result Date: 04/09/2023    ECHOCARDIOGRAM REPORT   Patient Name:   Beverly Wheeler Date of Exam: 04/09/2023 Medical Rec #:  [id number]       Height:       61.0 in Accession #:    [id number]      Weight:       189.2 lb Date of Birth:  01-15-1946       BSA:          1.845 m Patient Age:    76 years        BP:           124/45 mmHg Patient Gender: F               HR:           81 bpm. Exam Location:  Inpatient Procedure: 2D Echo, Cardiac Doppler, Color Doppler and Intracardiac            Opacification Agent Indications:    CHF  History:        Patient has prior history of Echocardiogram examinations, most                 recent 11/24/2017. Previous Myocardial Infarction and CAD, COPD;                 Risk Factors:Hypertension, Dyslipidemia and Former Smoker. At                 home oxygen therapy.  Sonographer:    Milda Smart Referring Phys: Dolly Rias  Sonographer Comments: Image acquisition challenging due to patient body habitus, Image acquisition challenging due to respiratory motion and Image acquisition challenging due to COPD. IMPRESSIONS  1. There is no left ventricular thrombus (Definity contrast was used). The apical myocardium is thin and hyperechoic, consistent with scar. Left ventricular ejection fraction, by estimation, is 25 to 30%. The left ventricle has severely decreased function. The left ventricle demonstrates global hypokinesis. The left ventricular internal cavity size was moderately dilated. Left ventricular diastolic parameters are consistent with Grade II diastolic  dysfunction (pseudonormalization). Elevated left atrial pressure. There is akinesis of the left ventricular, mid-apical anteroseptal wall, anterior wall and apical segment. There is severe hypokinesis of the left ventricular, entire inferior wall and inferolateral wall.  2. Right ventricular systolic function is normal. The right ventricular size is normal.  Cardiology Consultation   Patient ID: Beverly Wheeler MRN: [id number]; DOB: 1945/11/11  Admit date: 04/08/2023 Date of Consult: 04/10/2023  PCP:  [place], Betty G, MD   [facility] Providers Cardiologist:  Thurmon Fair, MD     Patient Profile:   Beverly Wheeler is a 77 y.o. female with a hx of MI/CAD multivessel (previously recommended for bypass surgery), HFrEF/ICM, hypertension, hyperlipidemia, COPD who is being seen 04/10/2023 for the evaluation of CHF at the request of Dr. Sharolyn Douglas.  History of Present Illness:   Beverly Wheeler is a 77 year old female with past medical history noted above.  She initially presented in March 2015 with a STEMI and underwent cardiac catheterization which showed significant three-vessel disease.  LV gram showed LVEF of 30 to 35%, moderate to severe inferior wall hypokinesis, moderate anterior wall hypokinesis. It was felt there would be a low likelihood of success or benefit from percutaneous intervention.  LAD could be revascularized but there was little viability in the anterior wall, RCA not amendable to PCI and circumflex stenosis would be challenging due to large side branch at the location of large occlusion.  She was evaluated by CT surgery with recommendations to undergo bypass but she ultimately declined.   Echocardiogram 11/2017 with LVEF of 35 to 40%, septal apical and inferior wall hypokinesis.   She followed with pulmonology, Dr. Sherene Sires, wears home O2 at 2L at all times.  Currently lives at home with her son who helps are with ADLs.  Currently follows with her PCP.  States she takes Lasix 40 mg daily.  Uses a walker to get around.  Noted over the past several days she had become increasingly fatigued and short of breath with productive cough.  Denies any chest pain prior to admission.  In the ED her labs showed sodium 140, potassium 4.6, creatinine 1.23, albumin 3.1, BNP 626, high-sensitivity troponin 81>> 68>> 50, WBC 11.3,  hemoglobin 9.2, TSH 1.7.  Chest x-ray with cardiomegaly and vascular congestion.  EKG shows sinus rhythm, 71 bpm, nonspecific changes.  She was given IV Lasix, steroids and antibiotics in the ED.  Admitted to internal medicine for further evaluation.  Cardiology asked to evaluate in regards to CHF.  Echocardiogram this admission with LVEF of 25 to 30%, global hypokinesis, grade 2 diastolic dysfunction, normal RV, moderately reduced LA, mildly reduced RA, mild to moderate MR.  Worsening inferior and inferior lateral hypokinesis.   Past Medical History:  Diagnosis Date   Allergy    Asthma    CAD (coronary artery disease)    COPD (chronic obstructive pulmonary disease) (HCC)    Depression    GERD (gastroesophageal reflux disease)    Hyperlipidemia    Hypertension    Myocardial infarction (HCC) 10/16/2013    Past Surgical History:  Procedure Laterality Date   CARDIAC CATHETERIZATION  10/17/13   significant 3 vessel disease   LEFT HEART CATHETERIZATION WITH CORONARY ANGIOGRAM N/A 10/17/2013   Procedure: LEFT HEART CATHETERIZATION WITH CORONARY ANGIOGRAM;  Surgeon: Lesleigh Noe, MD;  Location: [facility] CATH LAB;  Service: Cardiovascular;  Laterality: N/A;    Inpatient Medications: Scheduled Meds:  arformoterol  15 mcg Nebulization BID   aspirin EC  81 mg Oral Daily   atorvastatin  80 mg Oral QHS   azithromycin  500 mg Oral Q24H   budesonide (PULMICORT) nebulizer solution  0.25 mg Nebulization BID   cyanocobalamin  1,000 mcg Oral Daily   [START ON 04/11/2023] enoxaparin (LOVENOX) injection  30 mg  Cardiology Consultation   Patient ID: Beverly Wheeler MRN: [id number]; DOB: 1945/11/11  Admit date: 04/08/2023 Date of Consult: 04/10/2023  PCP:  [place], Betty G, MD   [facility] Providers Cardiologist:  Thurmon Fair, MD     Patient Profile:   Beverly Wheeler is a 77 y.o. female with a hx of MI/CAD multivessel (previously recommended for bypass surgery), HFrEF/ICM, hypertension, hyperlipidemia, COPD who is being seen 04/10/2023 for the evaluation of CHF at the request of Dr. Sharolyn Douglas.  History of Present Illness:   Beverly Wheeler is a 77 year old female with past medical history noted above.  She initially presented in March 2015 with a STEMI and underwent cardiac catheterization which showed significant three-vessel disease.  LV gram showed LVEF of 30 to 35%, moderate to severe inferior wall hypokinesis, moderate anterior wall hypokinesis. It was felt there would be a low likelihood of success or benefit from percutaneous intervention.  LAD could be revascularized but there was little viability in the anterior wall, RCA not amendable to PCI and circumflex stenosis would be challenging due to large side branch at the location of large occlusion.  She was evaluated by CT surgery with recommendations to undergo bypass but she ultimately declined.   Echocardiogram 11/2017 with LVEF of 35 to 40%, septal apical and inferior wall hypokinesis.   She followed with pulmonology, Dr. Sherene Sires, wears home O2 at 2L at all times.  Currently lives at home with her son who helps are with ADLs.  Currently follows with her PCP.  States she takes Lasix 40 mg daily.  Uses a walker to get around.  Noted over the past several days she had become increasingly fatigued and short of breath with productive cough.  Denies any chest pain prior to admission.  In the ED her labs showed sodium 140, potassium 4.6, creatinine 1.23, albumin 3.1, BNP 626, high-sensitivity troponin 81>> 68>> 50, WBC 11.3,  hemoglobin 9.2, TSH 1.7.  Chest x-ray with cardiomegaly and vascular congestion.  EKG shows sinus rhythm, 71 bpm, nonspecific changes.  She was given IV Lasix, steroids and antibiotics in the ED.  Admitted to internal medicine for further evaluation.  Cardiology asked to evaluate in regards to CHF.  Echocardiogram this admission with LVEF of 25 to 30%, global hypokinesis, grade 2 diastolic dysfunction, normal RV, moderately reduced LA, mildly reduced RA, mild to moderate MR.  Worsening inferior and inferior lateral hypokinesis.   Past Medical History:  Diagnosis Date   Allergy    Asthma    CAD (coronary artery disease)    COPD (chronic obstructive pulmonary disease) (HCC)    Depression    GERD (gastroesophageal reflux disease)    Hyperlipidemia    Hypertension    Myocardial infarction (HCC) 10/16/2013    Past Surgical History:  Procedure Laterality Date   CARDIAC CATHETERIZATION  10/17/13   significant 3 vessel disease   LEFT HEART CATHETERIZATION WITH CORONARY ANGIOGRAM N/A 10/17/2013   Procedure: LEFT HEART CATHETERIZATION WITH CORONARY ANGIOGRAM;  Surgeon: Lesleigh Noe, MD;  Location: [facility] CATH LAB;  Service: Cardiovascular;  Laterality: N/A;    Inpatient Medications: Scheduled Meds:  arformoterol  15 mcg Nebulization BID   aspirin EC  81 mg Oral Daily   atorvastatin  80 mg Oral QHS   azithromycin  500 mg Oral Q24H   budesonide (PULMICORT) nebulizer solution  0.25 mg Nebulization BID   cyanocobalamin  1,000 mcg Oral Daily   [START ON 04/11/2023] enoxaparin (LOVENOX) injection  30 mg  Cardiology Consultation   Patient ID: Beverly Wheeler MRN: [id number]; DOB: 1945/11/11  Admit date: 04/08/2023 Date of Consult: 04/10/2023  PCP:  [place], Betty G, MD   [facility] Providers Cardiologist:  Thurmon Fair, MD     Patient Profile:   Beverly Wheeler is a 77 y.o. female with a hx of MI/CAD multivessel (previously recommended for bypass surgery), HFrEF/ICM, hypertension, hyperlipidemia, COPD who is being seen 04/10/2023 for the evaluation of CHF at the request of Dr. Sharolyn Douglas.  History of Present Illness:   Beverly Wheeler is a 77 year old female with past medical history noted above.  She initially presented in March 2015 with a STEMI and underwent cardiac catheterization which showed significant three-vessel disease.  LV gram showed LVEF of 30 to 35%, moderate to severe inferior wall hypokinesis, moderate anterior wall hypokinesis. It was felt there would be a low likelihood of success or benefit from percutaneous intervention.  LAD could be revascularized but there was little viability in the anterior wall, RCA not amendable to PCI and circumflex stenosis would be challenging due to large side branch at the location of large occlusion.  She was evaluated by CT surgery with recommendations to undergo bypass but she ultimately declined.   Echocardiogram 11/2017 with LVEF of 35 to 40%, septal apical and inferior wall hypokinesis.   She followed with pulmonology, Dr. Sherene Sires, wears home O2 at 2L at all times.  Currently lives at home with her son who helps are with ADLs.  Currently follows with her PCP.  States she takes Lasix 40 mg daily.  Uses a walker to get around.  Noted over the past several days she had become increasingly fatigued and short of breath with productive cough.  Denies any chest pain prior to admission.  In the ED her labs showed sodium 140, potassium 4.6, creatinine 1.23, albumin 3.1, BNP 626, high-sensitivity troponin 81>> 68>> 50, WBC 11.3,  hemoglobin 9.2, TSH 1.7.  Chest x-ray with cardiomegaly and vascular congestion.  EKG shows sinus rhythm, 71 bpm, nonspecific changes.  She was given IV Lasix, steroids and antibiotics in the ED.  Admitted to internal medicine for further evaluation.  Cardiology asked to evaluate in regards to CHF.  Echocardiogram this admission with LVEF of 25 to 30%, global hypokinesis, grade 2 diastolic dysfunction, normal RV, moderately reduced LA, mildly reduced RA, mild to moderate MR.  Worsening inferior and inferior lateral hypokinesis.   Past Medical History:  Diagnosis Date   Allergy    Asthma    CAD (coronary artery disease)    COPD (chronic obstructive pulmonary disease) (HCC)    Depression    GERD (gastroesophageal reflux disease)    Hyperlipidemia    Hypertension    Myocardial infarction (HCC) 10/16/2013    Past Surgical History:  Procedure Laterality Date   CARDIAC CATHETERIZATION  10/17/13   significant 3 vessel disease   LEFT HEART CATHETERIZATION WITH CORONARY ANGIOGRAM N/A 10/17/2013   Procedure: LEFT HEART CATHETERIZATION WITH CORONARY ANGIOGRAM;  Surgeon: Lesleigh Noe, MD;  Location: [facility] CATH LAB;  Service: Cardiovascular;  Laterality: N/A;    Inpatient Medications: Scheduled Meds:  arformoterol  15 mcg Nebulization BID   aspirin EC  81 mg Oral Daily   atorvastatin  80 mg Oral QHS   azithromycin  500 mg Oral Q24H   budesonide (PULMICORT) nebulizer solution  0.25 mg Nebulization BID   cyanocobalamin  1,000 mcg Oral Daily   [START ON 04/11/2023] enoxaparin (LOVENOX) injection  30 mg

## 2023-04-10 NOTE — Progress Notes (Signed)
PROGRESS NOTE  Beverly Wheeler MVH:[id number] DOB: Sep 01, 1945 DOA: 04/08/2023 PCP: [place], Betty G, MD   HPI/Recap of past 24 hours: Beverly Wheeler is a 77 y.o. female with hx of COPD, chronic hypoxic respiratory failure on 2 L O2, CAD, CHF, CKD 3, HTN, HLD, IDA, who presents with few days of progressive fatigue, worsening SOB, and cough. Former smoker of 40 pack years. Has been using home inhalers and diuretics as prescribed.  Patient admitted for further management.    Today, pt still with coughing, denies any new complaints.    Assessment/Plan: Principal Problem:   COPD exacerbation (HCC)   Acute on chronic systolic and diastolic HF Noted pulmonary hypertension Ischemic cardiomyopathy BNP 626 Chest x-ray with pulmonary vascular congestion ECHO showed EF of 25 to 30%, global hypokinesis, grade 2 diastolic dysfunction, reduced EF from previous Cardiology consulted, was recommended for CABG back in 2015 due to extensive CAD, patient declined Hold IV Lasix, due to bump in creatinine Strict I's and O's, daily weights Daily BMP Telemetry  COPD exacerbation Acute on chronic hypoxic and hypercarbic respiratory failure  Currently saturating well on 2 L of O2, mentation intact Patient has declined BiPAP therapy Chest x-ray with pulm vascular congestion but no infiltrates S/p IV Solu-Medrol, continue p.o. prednisone Continue ceftriaxone 1 g IV every 24 hours, azithromycin 500 mg p.o DuoNebs, Brovana, Pulmicort Incentive spirometry, flutter valve BiPAP as needed, continue supplemental O2  Hypomagnesemia Replace prn   History of CAD HLD Chest pain free EKG without acute ischemic changes High-sensitivity troponin downtrending 81->68 Cardiology consulted, was recommended for CABG back in 2015, due to extensive CAD, patient declined LDL 129 Continue home aspirin, atorvastatin, Zetia  AKI on CKD 3a Rise in creatinine Hold Lasix Daily BMP  History IDA Baseline  hgb around 10 Continue home iron and B12   DM type 2 with hyperglycemia Last A1c 6.1 in 2023, repeat pending Home regimen is glipizide with breakfast, metformin twice daily SSI, Accu-Cheks, hypoglycemic protocol  Mood disorder Continue home Lexapro  Obesity Lifestyle modification advised    Estimated body mass index is 37.28 kg/m as calculated from the following:   Height as of this encounter: 5\' 1"  (1.549 m).   Weight as of this encounter: 89.5 kg.     Code Status: Full  Family Communication: None at bedside  Disposition Plan: Status is: Inpatient Remains inpatient appropriate because: Level of care      Consultants: Cardiology  Procedures: None  Antimicrobials: Ceftriaxone Azithromycin  DVT prophylaxis: Lovenox   Objective: Vitals:   04/10/23 0748 04/10/23 1101 04/10/23 1452 04/10/23 1546  BP:  138/70  (!) 148/66  Pulse: 69 83  65  Resp: 13 16  15   Temp:  98.6 F (37 C)  98.8 F (37.1 C)  TempSrc:  Oral  Oral  SpO2: 94% 93% 92% 98%  Weight:      Height:        Intake/Output Summary (Last 24 hours) at 04/10/2023 1835 Last data filed at 04/10/2023 8413 Gross per 24 hour  Intake 100 ml  Output 700 ml  Net -600 ml   Filed Weights   04/09/23 0031 04/09/23 0500 04/10/23 0400  Weight: 87.3 kg 85.8 kg 89.5 kg    Exam: General: NAD  Cardiovascular: S1, S2 present Respiratory: Bibasilar crackles noted Abdomen: Soft, nontender, nondistended, bowel sounds present Musculoskeletal: No bilateral pedal edema noted Skin: Normal Psychiatry: Normal mood     Data Reviewed: CBC: Recent Labs  Lab 04/08/23 1843 04/08/23  1852 04/08/23 2353 04/10/23 0222  WBC 11.3*  --  11.4* 11.0*  NEUTROABS 9.2*  --   --  8.9*  HGB 9.2* 9.5* 9.0* 8.2*  HCT 29.2* 28.0* 28.5* 24.4*  MCV 99.0  --  101.1* 96.8  PLT 200  --  187 185   Basic Metabolic Panel: Recent Labs  Lab 04/08/23 1843 04/08/23 1852 04/09/23 0244 04/10/23 0222  NA 140 139 138 134*  K  4.6 4.6 4.7 3.9  CL 90*  --  89* 88*  CO2 38*  --  32 35*  GLUCOSE 133*  --  263* 177*  BUN 17  --  18 26*  CREATININE 1.23*  --  1.24* 1.68*  CALCIUM 8.9  --  8.8* 8.1*  MG 1.7  --  1.5* 1.9  PHOS  --   --  3.1  --    GFR: Estimated Creatinine Clearance: 29.9 mL/min (A) (by C-G formula based on SCr of 1.68 mg/dL (H)). Liver Function Tests: Recent Labs  Lab 04/08/23 1843  AST 14*  ALT 15  ALKPHOS 72  BILITOT 0.5  PROT 6.3*  ALBUMIN 3.1*   No results for input(s): "LIPASE", "AMYLASE" in the last 168 hours. No results for input(s): "AMMONIA" in the last 168 hours. Coagulation Profile: No results for input(s): "INR", "PROTIME" in the last 168 hours. Cardiac Enzymes: No results for input(s): "CKTOTAL", "CKMB", "CKMBINDEX", "TROPONINI" in the last 168 hours. BNP (last 3 results) No results for input(s): "PROBNP" in the last 8760 hours. HbA1C: No results for input(s): "HGBA1C" in the last 72 hours. CBG: Recent Labs  Lab 04/09/23 1744 04/09/23 2140 04/10/23 0556 04/10/23 1100 04/10/23 1545  GLUCAP 231* 256* 158* 154* 217*   Lipid Profile: Recent Labs    04/09/23 0244  CHOL 188  HDL 24*  LDLCALC 129*  TRIG 176*  CHOLHDL 7.8   Thyroid Function Tests: Recent Labs    04/08/23 1843  TSH 1.742   Anemia Panel: Recent Labs    04/09/23 0244  VITAMINB12 990*  FERRITIN 35  TIBC 403  IRON 36   Urine analysis:    Component Value Date/Time   COLORURINE YELLOW 04/08/2023 2057   APPEARANCEUR HAZY (A) 04/08/2023 2057   LABSPEC 1.015 04/08/2023 2057   PHURINE 6.0 04/08/2023 2057   GLUCOSEU NEGATIVE 04/08/2023 2057   GLUCOSEU NEGATIVE 05/26/2009 1007   HGBUR TRACE (A) 04/08/2023 2057   BILIRUBINUR NEGATIVE 04/08/2023 2057   KETONESUR NEGATIVE 04/08/2023 2057   PROTEINUR NEGATIVE 04/08/2023 2057   UROBILINOGEN 0.2 10/16/2013 1735   NITRITE NEGATIVE 04/08/2023 2057   LEUKOCYTESUR NEGATIVE 04/08/2023 2057   Sepsis  Labs: @LABRCNTIP (procalcitonin:4,lacticidven:4)  ) Recent Results (from the past 240 hour(s))  Resp panel by RT-PCR (RSV, Flu A&B, Covid) Anterior Nasal Swab     Status: None   Collection Time: 04/08/23  6:42 PM   Specimen: Anterior Nasal Swab  Result Value Ref Range Status   SARS Coronavirus 2 by RT PCR NEGATIVE NEGATIVE Final   Influenza A by PCR NEGATIVE NEGATIVE Final   Influenza B by PCR NEGATIVE NEGATIVE Final    Comment: (NOTE) The Xpert Xpress SARS-CoV-2/FLU/RSV plus assay is intended as an aid in the diagnosis of influenza from Nasopharyngeal swab specimens and should not be used as a sole basis for treatment. Nasal washings and aspirates are unacceptable for Xpert Xpress SARS-CoV-2/FLU/RSV testing.  Fact Sheet for Patients: BloggerCourse.com  Fact Sheet for Healthcare Providers: SeriousBroker.it  This test is not yet approved or cleared by the  [place] [employer] trader and has been authorized for detection and/or diagnosis of SARS-CoV-2 by FDA under an TEFL teacher (EUA). This EUA will remain in effect (meaning this test can be used) for the duration of the COVID-19 declaration under Section 564(b)(1) of the Act, 21 U.S.C. section 360bbb-3(b)(1), unless the authorization is terminated or revoked.     Resp Syncytial Virus by PCR NEGATIVE NEGATIVE Final    Comment: (NOTE) Fact Sheet for Patients: BloggerCourse.com  Fact Sheet for Healthcare Providers: SeriousBroker.it  This test is not yet approved or cleared by the [place] FDA and has been authorized for detection and/or diagnosis of SARS-CoV-2 by FDA under an Emergency Use Authorization (EUA). This EUA will remain in effect (meaning this test can be used) for the duration of the COVID-19 declaration under Section 564(b)(1) of the Act, 21 U.S.C. section 360bbb-3(b)(1), unless the authorization is  terminated or revoked.  Performed at [facility], [address], [place], [place] [address]   SARS Coronavirus 2 by RT PCR (hospital order, performed in [facility] hospital lab) *cepheid single result test* Anterior Nasal Swab     Status: None   Collection Time: 04/08/23  6:43 PM   Specimen: Anterior Nasal Swab  Result Value Ref Range Status   SARS Coronavirus 2 by RT PCR NEGATIVE NEGATIVE Final    Comment: Performed at [facility], [address] [place], [place] [address]  MRSA Next Gen by PCR, Nasal     Status: None   Collection Time: 04/09/23 12:24 AM   Specimen: Nasal Mucosa; Nasal Swab  Result Value Ref Range Status   MRSA by PCR Next Gen NOT DETECTED NOT DETECTED Final    Comment: (NOTE) The GeneXpert MRSA Assay (FDA approved for NASAL specimens only), is one component of a comprehensive MRSA colonization surveillance program. It is not intended to diagnose MRSA infection nor to guide or monitor treatment for MRSA infections. Test performance is not FDA approved in patients less than 40 years old. Performed at [facility], [address] [place], [place] [address]       Studies: No results found.  Scheduled Meds:  arformoterol  15 mcg Nebulization BID   aspirin EC  81 mg Oral Daily   atorvastatin  80 mg Oral QHS   budesonide (PULMICORT) nebulizer solution  0.25 mg Nebulization BID   cyanocobalamin  1,000 mcg Oral Daily   [START ON 04/11/2023] enoxaparin (LOVENOX) injection  30 mg Subcutaneous Q24H   escitalopram  20 mg Oral Daily   ezetimibe  10 mg Oral Daily   ferrous gluconate  324 mg Oral BID WC   insulin aspart  0-6 Units Subcutaneous TID WC   montelukast  10 mg Oral QHS   nebivolol  5 mg Oral Daily   pantoprazole  40 mg Oral Daily   predniSONE  40 mg Oral Q breakfast   sodium chloride flush  3 mL Intravenous Q12H    Continuous Infusions:  cefTRIAXone (ROCEPHIN)  IV 1 g (04/10/23 1656)     LOS: 2 days     Briant Cedar, MD [facility]  If 7PM-7AM, please contact night-coverage [email or web address] 04/10/2023, 6:35 PM

## 2023-04-11 DIAGNOSIS — N179 Acute kidney failure, unspecified: Secondary | ICD-10-CM

## 2023-04-11 DIAGNOSIS — I5042 Chronic combined systolic (congestive) and diastolic (congestive) heart failure: Secondary | ICD-10-CM | POA: Diagnosis not present

## 2023-04-11 DIAGNOSIS — N1831 Chronic kidney disease, stage 3a: Secondary | ICD-10-CM

## 2023-04-11 DIAGNOSIS — I255 Ischemic cardiomyopathy: Secondary | ICD-10-CM | POA: Diagnosis not present

## 2023-04-11 DIAGNOSIS — I2510 Atherosclerotic heart disease of native coronary artery without angina pectoris: Secondary | ICD-10-CM | POA: Diagnosis not present

## 2023-04-11 DIAGNOSIS — J441 Chronic obstructive pulmonary disease with (acute) exacerbation: Secondary | ICD-10-CM | POA: Diagnosis not present

## 2023-04-11 LAB — BASIC METABOLIC PANEL
Anion gap: 8 (ref 5–15)
BUN: 25 mg/dL — ABNORMAL HIGH (ref 8–23)
CO2: 39 mmol/L — ABNORMAL HIGH (ref 22–32)
Calcium: 8.6 mg/dL — ABNORMAL LOW (ref 8.9–10.3)
Chloride: 90 mmol/L — ABNORMAL LOW (ref 98–111)
Creatinine, Ser: 1.37 mg/dL — ABNORMAL HIGH (ref 0.44–1.00)
GFR, Estimated: 41 mL/min — ABNORMAL LOW (ref >60)
Glucose, Bld: 145 mg/dL — ABNORMAL HIGH (ref 70–99)
Potassium: 4.4 mmol/L (ref 3.5–5.1)
Sodium: 137 mmol/L (ref 135–145)

## 2023-04-11 LAB — CBC WITH DIFFERENTIAL/PLATELET
Abs Immature Granulocytes: 0.06 10*3/uL (ref 0.00–0.07)
Basophils Absolute: 0 10*3/uL (ref 0.0–0.1)
Basophils Relative: 0 %
Eosinophils Absolute: 0 10*3/uL (ref 0.0–0.5)
Eosinophils Relative: 0 %
HCT: 26.1 % — ABNORMAL LOW (ref 36.0–46.0)
Hemoglobin: 8.5 g/dL — ABNORMAL LOW (ref 12.0–15.0)
Immature Granulocytes: 1 %
Lymphocytes Relative: 14 %
Lymphs Abs: 1.4 10*3/uL (ref 0.7–4.0)
MCH: 31.8 pg (ref 26.0–34.0)
MCHC: 32.6 g/dL (ref 30.0–36.0)
MCV: 97.8 fL (ref 80.0–100.0)
Monocytes Absolute: 0.7 10*3/uL (ref 0.1–1.0)
Monocytes Relative: 7 %
Neutro Abs: 8.1 10*3/uL — ABNORMAL HIGH (ref 1.7–7.7)
Neutrophils Relative %: 78 %
Platelets: 184 10*3/uL (ref 150–400)
RBC: 2.67 MIL/uL — ABNORMAL LOW (ref 3.87–5.11)
RDW: 12.4 % (ref 11.5–15.5)
WBC: 10.3 10*3/uL (ref 4.0–10.5)
nRBC: 0 % (ref 0.0–0.2)

## 2023-04-11 LAB — GLUCOSE, CAPILLARY
Glucose-Capillary: 132 mg/dL — ABNORMAL HIGH (ref 70–99)
Glucose-Capillary: 220 mg/dL — ABNORMAL HIGH (ref 70–99)
Glucose-Capillary: 177 mg/dL — ABNORMAL HIGH (ref 70–99)
Glucose-Capillary: 250 mg/dL — ABNORMAL HIGH (ref 70–99)

## 2023-04-11 MED ORDER — TRIAMCINOLONE ACETONIDE 55 MCG/ACT NA AERO
2.0000 | INHALATION_SPRAY | Freq: Every day | NASAL | Status: DC
  Administered 2023-04-11 – 2023-04-14 (×4): 2 via NASAL
  Filled 2023-04-11: qty 21.6

## 2023-04-11 MED ORDER — ENOXAPARIN SODIUM 40 MG/0.4ML IJ SOSY
40.0000 mg | PREFILLED_SYRINGE | INTRAMUSCULAR | Status: DC
  Administered 2023-04-11 – 2023-04-14 (×4): 40 mg via SUBCUTANEOUS
  Filled 2023-04-11 (×4): qty 0.4

## 2023-04-11 MED ORDER — LORATADINE 10 MG PO TABS
10.0000 mg | ORAL_TABLET | Freq: Every day | ORAL | Status: DC
  Administered 2023-04-11 – 2023-04-13 (×3): 10 mg via ORAL
  Filled 2023-04-11 (×3): qty 1

## 2023-04-11 NOTE — Progress Notes (Signed)
   Heart Failure Stewardship Pharmacist Progress Note   PCP: [place], Betty G, MD PCP-Cardiologist: Thurmon Fair, MD    HPI:  77 yo F with PMH of CAD, CHF, HTN, HLD, diabetes, CKD III, and COPD.  In 2015, admitted for STEMI. Underwent cath that showed significant 3 vessel disease. EF 30-35%. She declined CABG. ECHO 11/2017 showed EF 35-40%.   Admitted on 9/14 with weakness and shortness of breath. CXR with pulmonary vascular congestion. BNP elevated. ECHO on 9/15 showed LVEF reduced further to 25-30%, global hypokinesis, G2DD, RV normal, mild to moderate MR, mild AS. She is being treated for COPD and CHF exacerbation.   Current HF Medications: None  Prior to admission HF Medications: Diuretic: furosemide 40 mg daily  Pertinent Lab Values: Serum creatinine 1.37, BUN 25, Potassium 4.4, Sodium 137, BNP 626.5, Magnesium 1.9  Vital Signs: Weight: 195 lbs (admission weight: 189 lbs) Blood pressure: 130/50s  Heart rate: 60-70s  I/O: net -1.8L since admission  Medication Assistance / Insurance Benefits Check: Does the patient have prescription insurance?  Yes Type of insurance plan: UHC Medicare + [facility]  Outpatient Pharmacy:  Prior to admission [facility] Is the patient willing to use [facility] TOC pharmacy at discharge? Yes Is the patient willing to transition their outpatient pharmacy to utilize a [facility] outpatient pharmacy?   Pending    Assessment: 1. Acute on chronic systolic CHF (LVEF 25-30%), due to ICM. NYHA class II symptoms. - Holding IV lasix with AKI. Creatinine 1.24>1.68>1.37. Strict I/Os and daily weights. Keep K>4 and Mg>2. - BB limited with lung disease, on nebivolol - Consider starting ARB prior to discharge pending further improvement in creatinine - Poor candidate for SGLT2i - GDMT limited in the setting of chronic respiratory failure, renal disease, and poor hygiene.    Plan: 1) Medication changes recommended at this time: - None pending further  improvement in creatinine   2) Patient assistance: - None pending, has Medicare + [facility]  3)  Education  - To be completed prior to discharge  Sharen Hones, PharmD, BCPS Heart Failure Stewardship Pharmacist Phone [phone]

## 2023-04-11 NOTE — Care Management Important Message (Signed)
Important Message  Patient Details  Name: Beverly Wheeler MRN: [id number] Date of Birth: [birth year]   Medicare Important Message Given:  Yes     Dorena Bodo 04/11/2023, 2:48 PM

## 2023-04-11 NOTE — Progress Notes (Signed)
Rounding Note    Patient Name: Beverly Wheeler Date of Encounter: 04/11/2023  [facility] Cardiologist: Thurmon Fair, MD   Subjective   No acute events overnight. Denies chest pain. Wonders why she keeps having intermittent breathing issues, wonders if it is allergies.  Inpatient Medications    Scheduled Meds:  arformoterol  15 mcg Nebulization BID   aspirin EC  81 mg Oral Daily   atorvastatin  80 mg Oral QHS   budesonide (PULMICORT) nebulizer solution  0.25 mg Nebulization BID   cyanocobalamin  1,000 mcg Oral Daily   enoxaparin (LOVENOX) injection  40 mg Subcutaneous Q24H   escitalopram  20 mg Oral Daily   ezetimibe  10 mg Oral Daily   ferrous gluconate  324 mg Oral BID WC   insulin aspart  0-6 Units Subcutaneous TID WC   montelukast  10 mg Oral QHS   nebivolol  5 mg Oral Daily   pantoprazole  40 mg Oral Daily   predniSONE  40 mg Oral Q breakfast   sodium chloride flush  3 mL Intravenous Q12H   Continuous Infusions:  cefTRIAXone (ROCEPHIN)  IV Stopped (04/10/23 1736)   PRN Meds: acetaminophen, albuterol, alum & mag hydroxide-simeth   Vital Signs    Vitals:   04/10/23 2219 04/10/23 2304 04/11/23 0450 04/11/23 0700  BP:  128/62 (!) 130/55 101/71  Pulse: 61 67 62 67  Resp: 16 17 14 18   Temp:  98.6 F (37 C) 98.4 F (36.9 C) 98.7 F (37.1 C)  TempSrc:  Oral Oral Oral  SpO2: 96% 99% 98% 96%  Weight:   88.6 kg   Height:        Intake/Output Summary (Last 24 hours) at 04/11/2023 0926 Last data filed at 04/11/2023 0451 Gross per 24 hour  Intake 100 ml  Output 250 ml  Net -150 ml      04/11/2023    4:50 AM 04/10/2023    4:00 AM 04/09/2023    5:00 AM  Last 3 Weights  Weight (lbs) 195 lb 5.2 oz 197 lb 5 oz 189 lb 2.5 oz  Weight (kg) 88.6 kg 89.5 kg 85.8 kg      Telemetry    SR - Personally Reviewed  Physical Exam   GEN: No acute distress.  On O2 by nasal cannula Neck:  unable to assess 2/2 body habitus Cardiac: RRR, no murmurs, rubs,  or gallops.  Respiratory: distant breath sounds bilaterally GI: Soft, nontender, non-distended  MS: No edema; No deformity. Neuro:  Nonfocal  Psych: Normal affect   New pertinent results (labs, ECG, imaging, cardiac studies)    echo shows that EF now 25-30%, was 35-40% in 2019.   Patient Profile     77 y.o. female with complex PMH, including CAD recommended for CABG in 2015, chronic systolic and diastolic heart failure, chronic hypoxic respiratory failure on home O2 whom cardiology is asked to see for worsening EF on echo.   Assessment & Plan    CAD Chronic systolic and diastolic heart failure Ischemic cardiomyopathy -recommended for CABG in 2015, cath has been extensively reviewed previously and not recommended for PCI -she declined CABG at that time. Given her comorbidities, lack of symptoms, and prior preferences to avoid surgery, would manage medically. She is in agreement with this. -holding IV lasix given Cr bump, exam. Limited options for GDMT. On nebivolol, tolerating. If Cr continues to improve, may be able to add ARB if there is blood pressure room. Discussed with the  patient today. She notes that she has had issues with hypotension in the past, and she has had occasional blood pressures here around 100 systolic, so this may not be an option. -with risk of UTI, largely bedbound, would avoid SGLT2i at this time -continue aspirin, statin, ezetimibe  AKI on CKD stage 3a -Cr downtrending slightly today to 1.37. Was 1 a year ago and 1.23 on admission   Chronic hypoxemic respiratory failure, on home O2 COPD CAP -per primary team    Signed, Jodelle Red, MD  04/11/2023, 9:26 AM

## 2023-04-11 NOTE — Evaluation (Signed)
Occupational Therapy Evaluation Patient Details Name: Beverly Wheeler MRN: [id number] DOB: August 31, 1945 Today's Date: 04/11/2023   History of Present Illness 77 y.o. female with hx of COPD, chronic hypoxic respiratory failure on 2 L O2, coronary artery disease, MI, heart failure with reduced ejection fraction, CKD 3, hypertension, diabetes, IDA, who presents with few days of progressive fatigue, new shortness of breath and cough   Clinical Impression   Patient admitted for the diagnosis above.  PTA she lives at home with her son, who provides assist as needed.  Patient typically walks with a 4WRW and states she is above to complete her own ADL.  Patient would like to try for home and [facility] OT/PT, if the son can provide the needed 24 hour up to Min-Mod A this is a possibility.  She should progress with consistent mobility in the acute setting, but SNF may need to be considered.  OT will continue efforts in the acute setting to address deficits.         If plan is discharge home, recommend the following: Assist for transportation;Assistance with cooking/housework;A lot of help with bathing/dressing/bathroom;A lot of help with walking and/or transfers    Functional Status Assessment  Patient has had a recent decline in their functional status and demonstrates the ability to make significant improvements in function in a reasonable and predictable amount of time.  Equipment Recommendations  None recommended by OT    Recommendations for Other Services       Precautions / Restrictions Precautions Precautions: Fall Precaution Comments: Watch O2 Restrictions Weight Bearing Restrictions: No      Mobility Bed Mobility Overal bed mobility: Needs Assistance Bed Mobility: Supine to Sit     Supine to sit: Mod assist       Patient Response: Cooperative  Transfers Overall transfer level: Needs assistance   Transfers: Sit to/from Stand, Bed to chair/wheelchair/BSC Sit to Stand: Min  assist Stand pivot transfers: Min assist                Balance Overall balance assessment: Needs assistance Sitting-balance support: Feet supported Sitting balance-Leahy Scale: Fair     Standing balance support: Reliant on assistive device for balance Standing balance-Leahy Scale: Poor                             ADL either performed or assessed with clinical judgement   ADL Overall ADL's : Needs assistance/impaired     Grooming: Wash/dry hands;Wash/dry face;Supervision/safety;Sitting           Upper Body Dressing : Set up;Sitting   Lower Body Dressing: Moderate assistance;Sitting/lateral leans   Toilet Transfer: Minimal assistance;Stand-pivot;Rolling walker (2 wheels);BSC/3in1   Toileting- Clothing Manipulation and Hygiene: Maximal assistance;Sit to/from stand               Vision Patient Visual Report: No change from baseline       Perception Perception: Within Functional Limits       Praxis Praxis: [facility]       Pertinent Vitals/Pain Pain Assessment Pain Assessment: No/denies pain     Extremity/Trunk Assessment Upper Extremity Assessment Upper Extremity Assessment: Overall WFL for tasks assessed   Lower Extremity Assessment Lower Extremity Assessment: Defer to PT evaluation   Cervical / Trunk Assessment Cervical / Trunk Assessment: Kyphotic   Communication Communication Communication: No apparent difficulties   Cognition Arousal: Alert Behavior During Therapy: WFL for tasks assessed/performed Overall Cognitive Status: Within Functional Limits for tasks  assessed                                       General Comments   O2 to 88% on 4L with SPT    Exercises     Shoulder Instructions      Home Living Family/patient expects to be discharged to:: Private residence Living Arrangements: Children Available Help at Discharge: Family;Available 24 hours/day Type of Home: House Home Access: Stairs to  enter [employer] of Steps: 2 Entrance Stairs-Rails: None Home Layout: Multi-level;Able to live on main level with bedroom/bathroom Alternate Level Stairs-Number of Steps: 4-6 Alternate Level Stairs-Rails: Left Bathroom Shower/Tub: Chief Strategy Officer: Standard Bathroom Accessibility: Yes How Accessible: Accessible via walker Home Equipment: Rollator (4 wheels);Wheelchair - Sport and exercise psychologist Comments: 2-3L O2      Prior Functioning/Environment Prior Level of Function : Needs assist             Mobility Comments: Generally furniture surfs at home, but does use 4WRW at times ADLs Comments: Son assists with iADL, meds and meals and community mobility.  Patient takes baths, and needs occasional assist with lower body ADL        OT Problem List: Impaired balance (sitting and/or standing);Decreased activity tolerance      OT Treatment/Interventions: Self-care/ADL training;Therapeutic activities;Patient/family education;Balance training;DME and/or AE instruction    OT Goals(Current goals can be found in the care plan section) Acute Rehab OT Goals Patient Stated Goal: Return home OT Goal Formulation: With patient Time For Goal Achievement: 04/25/23 Potential to Achieve Goals: Good ADL Goals Pt Will Perform Grooming: with set-up;sitting;standing Pt Will Perform Lower Body Dressing: with min assist;sit to/from stand Pt Will Transfer to Toilet: with supervision;stand pivot transfer;bedside commode  OT Frequency:      Co-evaluation              AM-PAC OT "6 Clicks" Daily Activity     Outcome Measure Help from another person eating meals?: None Help from another person taking care of personal grooming?: A Little Help from another person toileting, which includes using toliet, bedpan, or urinal?: A Lot Help from another person bathing (including washing, rinsing, drying)?: A Lot Help from another person to put on and taking off  regular upper body clothing?: A Little Help from another person to put on and taking off regular lower body clothing?: A Lot 6 Click Score: 16   End of Session Equipment Utilized During Treatment: Rolling walker (2 wheels);Oxygen Nurse Communication: Mobility status  Activity Tolerance: Patient tolerated treatment well Patient left: in chair;with call bell/phone within reach;with chair alarm set  OT Visit Diagnosis: Unsteadiness on feet (R26.81);Muscle weakness (generalized) (M62.81)                Time: 4098-1191 OT Time Calculation (min): 23 min Charges:  OT General Charges $OT Visit: 1 Visit OT Evaluation $OT Eval Moderate Complexity: 1 Mod OT Treatments $Self Care/Home Management : 8-22 mins  04/11/2023  RP, OTR/L  Acute Rehabilitation Services  Office:  [phone]   Suzanna Obey 04/11/2023, 11:05 AM

## 2023-04-11 NOTE — Plan of Care (Signed)
  Problem: Education: Goal: Ability to demonstrate management of disease process will improve Outcome: Progressing Goal: Ability to verbalize understanding of medication therapies will improve Outcome: Progressing Goal: Individualized Educational Video(s) Outcome: Progressing   Problem: Activity: Goal: Capacity to carry out activities will improve Outcome: Progressing   Problem: Cardiac: Goal: Ability to achieve and maintain adequate cardiopulmonary perfusion will improve Outcome: Progressing   Problem: Education: Goal: Knowledge of disease or condition will improve Outcome: Progressing Goal: Knowledge of the prescribed therapeutic regimen will improve Outcome: Progressing Goal: Individualized Educational Video(s) Outcome: Progressing   Problem: Activity: Goal: Ability to tolerate increased activity will improve Outcome: Progressing Goal: Will verbalize the importance of balancing activity with adequate rest periods Outcome: Progressing   Problem: Respiratory: Goal: Ability to maintain a clear airway will improve Outcome: Progressing Goal: Levels of oxygenation will improve Outcome: Progressing Goal: Ability to maintain adequate ventilation will improve Outcome: Progressing   Problem: Coping: Goal: Ability to adjust to condition or change in health will improve Outcome: Progressing   Problem: Fluid Volume: Goal: Ability to maintain a balanced intake and output will improve Outcome: Progressing   Problem: Health Behavior/Discharge Planning: Goal: Ability to identify and utilize available resources and services will improve Outcome: Progressing Goal: Ability to manage health-related needs will improve Outcome: Progressing   Problem: Metabolic: Goal: Ability to maintain appropriate glucose levels will improve Outcome: Progressing   Problem: Nutritional: Goal: Maintenance of adequate nutrition will improve Outcome: Progressing Goal: Progress toward achieving an  optimal weight will improve Outcome: Progressing   Problem: Skin Integrity: Goal: Risk for impaired skin integrity will decrease Outcome: Progressing   Problem: Tissue Perfusion: Goal: Adequacy of tissue perfusion will improve Outcome: Progressing   Problem: Clinical Measurements: Goal: Ability to maintain clinical measurements within normal limits will improve Outcome: Progressing Goal: Will remain free from infection Outcome: Progressing Goal: Diagnostic test results will improve Outcome: Progressing Goal: Respiratory complications will improve Outcome: Progressing Goal: Cardiovascular complication will be avoided Outcome: Progressing

## 2023-04-11 NOTE — Progress Notes (Signed)
PROGRESS NOTE  Beverly Wheeler XBJ:[id number] DOB: Mar 05, 1946 DOA: 04/08/2023 PCP: [place], Betty G, MD   HPI/Recap of past 24 hours: Beverly Wheeler is a 77 y.o. female with hx of COPD, chronic hypoxic respiratory failure on 2 L O2, CAD, CHF, CKD 3, HTN, HLD, IDA, who presents with few days of progressive fatigue, worsening SOB, and cough. Former smoker of 40 pack years. Has been using home inhalers and diuretics as prescribed.  Patient admitted for further management.    Today, pt denies any worsening SOB, chest pain    Assessment/Plan: Principal Problem:   COPD exacerbation (HCC)   Acute on chronic systolic and diastolic HF Noted pulmonary hypertension Ischemic cardiomyopathy BNP 626 Chest x-ray with pulmonary vascular congestion ECHO showed EF of 25 to 30%, global hypokinesis, grade 2 diastolic dysfunction, reduced EF from previous Cardiology consulted, was recommended for CABG back in 2015 due to extensive CAD, patient declined. May add ARB pending BP and renal fxn Hold IV Lasix, due to bump in creatinine Strict I's and O's, daily weights Daily BMP Telemetry  COPD exacerbation Acute on chronic hypoxic and hypercarbic respiratory failure  Currently saturating well on 2 L of O2, mentation intact Patient has declined BiPAP therapy Chest x-ray with pulm vascular congestion but no infiltrates S/p IV Solu-Medrol, continue p.o. prednisone Continue ceftriaxone 1 g IV every 24 hours, completed azithromycin 500 mg p.o DuoNebs, Brovana, Pulmicort Incentive spirometry, flutter valve BiPAP as needed, continue supplemental O2  Hypomagnesemia Replace prn   History of CAD HLD Chest pain free EKG without acute ischemic changes High-sensitivity troponin downtrending 81->68 Cardiology consulted, was recommended for CABG back in 2015, due to extensive CAD, patient declined LDL 129 Continue home aspirin, atorvastatin, Zetia  AKI on CKD 3a Rise in creatinine Hold  Lasix Daily BMP  History IDA Baseline hgb around 10 Continue home iron and B12   DM type 2 with hyperglycemia Last A1c 6.1 in 2023, repeat pending Home regimen is glipizide with breakfast, metformin twice daily SSI, Accu-Cheks, hypoglycemic protocol  Mood disorder Continue home Lexapro  Obesity Lifestyle modification advised    Estimated body mass index is 36.91 kg/m as calculated from the following:   Height as of this encounter: 5\' 1"  (1.549 m).   Weight as of this encounter: 88.6 kg.     Code Status: Full  Family Communication: None at bedside  Disposition Plan: Status is: Inpatient Remains inpatient appropriate because: Level of care      Consultants: Cardiology  Procedures: None  Antimicrobials: Ceftriaxone Azithromycin  DVT prophylaxis: Lovenox   Objective: Vitals:   04/11/23 0700 04/11/23 0744 04/11/23 1109 04/11/23 1616  BP: 101/71  (!) 128/56   Pulse: 67 70 69   Resp: 18 18 16    Temp: 98.7 F (37.1 C)  98.6 F (37 C) (P) 98.5 F (36.9 C)  TempSrc: Oral  Oral (P) Oral  SpO2: 96% 96% (!) 84%   Weight:      Height:        Intake/Output Summary (Last 24 hours) at 04/11/2023 1817 Last data filed at 04/11/2023 1249 Gross per 24 hour  Intake 100 ml  Output 500 ml  Net -400 ml   Filed Weights   04/09/23 0500 04/10/23 0400 04/11/23 0450  Weight: 85.8 kg 89.5 kg 88.6 kg    Exam: General: NAD  Cardiovascular: S1, S2 present Respiratory: Diminished BS Abdomen: Soft, nontender, nondistended, bowel sounds present Musculoskeletal: No bilateral pedal edema noted Skin: Normal Psychiatry: Normal mood  Data Reviewed: CBC: Recent Labs  Lab 04/08/23 1843 04/08/23 1852 04/08/23 2353 04/10/23 0222 04/11/23 0233  WBC 11.3*  --  11.4* 11.0* 10.3  NEUTROABS 9.2*  --   --  8.9* 8.1*  HGB 9.2* 9.5* 9.0* 8.2* 8.5*  HCT 29.2* 28.0* 28.5* 24.4* 26.1*  MCV 99.0  --  101.1* 96.8 97.8  PLT 200  --  187 185 184   Basic Metabolic  Panel: Recent Labs  Lab 04/08/23 1843 04/08/23 1852 04/09/23 0244 04/10/23 0222 04/11/23 0233  NA 140 139 138 134* 137  K 4.6 4.6 4.7 3.9 4.4  CL 90*  --  89* 88* 90*  CO2 38*  --  32 35* 39*  GLUCOSE 133*  --  263* 177* 145*  BUN 17  --  18 26* 25*  CREATININE 1.23*  --  1.24* 1.68* 1.37*  CALCIUM 8.9  --  8.8* 8.1* 8.6*  MG 1.7  --  1.5* 1.9  --   PHOS  --   --  3.1  --   --    GFR: Estimated Creatinine Clearance: 36.5 mL/min (A) (by C-G formula based on SCr of 1.37 mg/dL (H)). Liver Function Tests: Recent Labs  Lab 04/08/23 1843  AST 14*  ALT 15  ALKPHOS 72  BILITOT 0.5  PROT 6.3*  ALBUMIN 3.1*   No results for input(s): "LIPASE", "AMYLASE" in the last 168 hours. No results for input(s): "AMMONIA" in the last 168 hours. Coagulation Profile: No results for input(s): "INR", "PROTIME" in the last 168 hours. Cardiac Enzymes: No results for input(s): "CKTOTAL", "CKMB", "CKMBINDEX", "TROPONINI" in the last 168 hours. BNP (last 3 results) No results for input(s): "PROBNP" in the last 8760 hours. HbA1C: No results for input(s): "HGBA1C" in the last 72 hours. CBG: Recent Labs  Lab 04/10/23 1545 04/10/23 2108 04/11/23 0626 04/11/23 1118 04/11/23 1627  GLUCAP 217* 219* 132* 220* 177*   Lipid Profile: Recent Labs    04/09/23 0244  CHOL 188  HDL 24*  LDLCALC 129*  TRIG 176*  CHOLHDL 7.8   Thyroid Function Tests: Recent Labs    04/08/23 1843  TSH 1.742   Anemia Panel: Recent Labs    04/09/23 0244  VITAMINB12 990*  FERRITIN 35  TIBC 403  IRON 36   Urine analysis:    Component Value Date/Time   COLORURINE YELLOW 04/08/2023 2057   APPEARANCEUR HAZY (A) 04/08/2023 2057   LABSPEC 1.015 04/08/2023 2057   PHURINE 6.0 04/08/2023 2057   GLUCOSEU NEGATIVE 04/08/2023 2057   GLUCOSEU NEGATIVE 05/26/2009 1007   HGBUR TRACE (A) 04/08/2023 2057   BILIRUBINUR NEGATIVE 04/08/2023 2057   KETONESUR NEGATIVE 04/08/2023 2057   PROTEINUR NEGATIVE 04/08/2023  2057   UROBILINOGEN 0.2 10/16/2013 1735   NITRITE NEGATIVE 04/08/2023 2057   LEUKOCYTESUR NEGATIVE 04/08/2023 2057   Sepsis Labs: @LABRCNTIP (procalcitonin:4,lacticidven:4)  ) Recent Results (from the past 240 hour(s))  Resp panel by RT-PCR (RSV, Flu A&B, Covid) Anterior Nasal Swab     Status: None   Collection Time: 04/08/23  6:42 PM   Specimen: Anterior Nasal Swab  Result Value Ref Range Status   SARS Coronavirus 2 by RT PCR NEGATIVE NEGATIVE Final   Influenza A by PCR NEGATIVE NEGATIVE Final   Influenza B by PCR NEGATIVE NEGATIVE Final    Comment: (NOTE) The Xpert Xpress SARS-CoV-2/FLU/RSV plus assay is intended as an aid in the diagnosis of influenza from Nasopharyngeal swab specimens and should not be used as a sole basis for treatment. Nasal  washings and aspirates are unacceptable for Xpert Xpress SARS-CoV-2/FLU/RSV testing.  Fact Sheet for Patients: BloggerCourse.com  Fact Sheet for Healthcare Providers: SeriousBroker.it  This test is not yet approved or cleared by the [place] FDA and has been authorized for detection and/or diagnosis of SARS-CoV-2 by FDA under an Emergency Use Authorization (EUA). This EUA will remain in effect (meaning this test can be used) for the duration of the COVID-19 declaration under Section 564(b)(1) of the Act, 21 U.S.C. section 360bbb-3(b)(1), unless the authorization is terminated or revoked.     Resp Syncytial Virus by PCR NEGATIVE NEGATIVE Final    Comment: (NOTE) Fact Sheet for Patients: BloggerCourse.com  Fact Sheet for Healthcare Providers: SeriousBroker.it  This test is not yet approved or cleared by the [place] FDA and has been authorized for detection and/or diagnosis of SARS-CoV-2 by FDA under an Emergency Use Authorization (EUA). This EUA will remain in effect (meaning this test can be used) for the duration of  the COVID-19 declaration under Section 564(b)(1) of the Act, 21 U.S.C. section 360bbb-3(b)(1), unless the authorization is terminated or revoked.  Performed at [facility], [address], [place], [place] [address]   SARS Coronavirus 2 by RT PCR (hospital order, performed in [facility] hospital lab) *cepheid single result test* Anterior Nasal Swab     Status: None   Collection Time: 04/08/23  6:43 PM   Specimen: Anterior Nasal Swab  Result Value Ref Range Status   SARS Coronavirus 2 by RT PCR NEGATIVE NEGATIVE Final    Comment: Performed at [facility], [address] [place], [place] [address]  MRSA Next Gen by PCR, Nasal     Status: None   Collection Time: 04/09/23 12:24 AM   Specimen: Nasal Mucosa; Nasal Swab  Result Value Ref Range Status   MRSA by PCR Next Gen NOT DETECTED NOT DETECTED Final    Comment: (NOTE) The GeneXpert MRSA Assay (FDA approved for NASAL specimens only), is one component of a comprehensive MRSA colonization surveillance program. It is not intended to diagnose MRSA infection nor to guide or monitor treatment for MRSA infections. Test performance is not FDA approved in patients less than 68 years old. Performed at [facility], [address] [place], [place] [address]       Studies: No results found.  Scheduled Meds:  arformoterol  15 mcg Nebulization BID   aspirin EC  81 mg Oral Daily   atorvastatin  80 mg Oral QHS   budesonide (PULMICORT) nebulizer solution  0.25 mg Nebulization BID   cyanocobalamin  1,000 mcg Oral Daily   enoxaparin (LOVENOX) injection  40 mg Subcutaneous Q24H   escitalopram  20 mg Oral Daily   ezetimibe  10 mg Oral Daily   ferrous gluconate  324 mg Oral BID WC   insulin aspart  0-6 Units Subcutaneous TID WC   loratadine  10 mg Oral Daily   montelukast  10 mg Oral QHS   nebivolol  5 mg Oral Daily   pantoprazole  40 mg Oral Daily   predniSONE  40 mg Oral Q breakfast   sodium chloride flush  3 mL  Intravenous Q12H   triamcinolone  2 spray Nasal Daily    Continuous Infusions:  cefTRIAXone (ROCEPHIN)  IV 1 g (04/11/23 1747)     LOS: 3 days     Briant Cedar, MD [facility]  If 7PM-7AM, please contact night-coverage [email or web address] 04/11/2023, 6:17 PM

## 2023-04-12 ENCOUNTER — Other Ambulatory Visit (HOSPITAL_COMMUNITY): Payer: Self-pay

## 2023-04-12 ENCOUNTER — Encounter (HOSPITAL_COMMUNITY): Payer: Self-pay | Admitting: Internal Medicine

## 2023-04-12 DIAGNOSIS — I255 Ischemic cardiomyopathy: Secondary | ICD-10-CM | POA: Diagnosis not present

## 2023-04-12 DIAGNOSIS — J441 Chronic obstructive pulmonary disease with (acute) exacerbation: Secondary | ICD-10-CM | POA: Diagnosis not present

## 2023-04-12 LAB — GLUCOSE, CAPILLARY
Glucose-Capillary: 114 mg/dL — ABNORMAL HIGH (ref 70–99)
Glucose-Capillary: 251 mg/dL — ABNORMAL HIGH (ref 70–99)
Glucose-Capillary: 241 mg/dL — ABNORMAL HIGH (ref 70–99)
Glucose-Capillary: 183 mg/dL — ABNORMAL HIGH (ref 70–99)

## 2023-04-12 MED ORDER — GUAIFENESIN ER 600 MG PO TB12
600.0000 mg | ORAL_TABLET | Freq: Two times a day (BID) | ORAL | Status: DC
  Administered 2023-04-12 – 2023-04-14 (×5): 600 mg via ORAL
  Filled 2023-04-12 (×5): qty 1

## 2023-04-12 MED ORDER — LOSARTAN POTASSIUM 25 MG PO TABS
12.5000 mg | ORAL_TABLET | Freq: Every day | ORAL | Status: DC
  Administered 2023-04-12 – 2023-04-14 (×3): 12.5 mg via ORAL
  Filled 2023-04-12 (×3): qty 1

## 2023-04-12 NOTE — Progress Notes (Signed)
Mobility Specialist Progress Note:   04/12/23 1549  Mobility  Activity Ambulated with assistance in hallway  Level of Assistance Contact guard assist, steadying assist  Assistive Device Four wheel walker  Distance Ambulated (ft) 160 ft  Activity Response Tolerated well  Mobility Referral Yes  $Mobility charge 1 Mobility  Mobility Specialist Start Time (ACUTE ONLY) 1507  Mobility Specialist Stop Time (ACUTE ONLY) 1528  Mobility Specialist Time Calculation (min) (ACUTE ONLY) 21 min    Pre Mobility: 67 HR,  97% SpO2 During Mobility: 106 HR,  90% SpO2 Post Mobility:  75 HR,  90% SpO2  Pt received in chair, agreeable to mobility. Took x1 seated rest break d/t fatigue. VSS throughout. Pt left in chair with call bell and all needs met.  D'Vante Earlene Plater Mobility Specialist Please contact via Special educational needs teacher or Rehab office at [phone]

## 2023-04-12 NOTE — Progress Notes (Signed)
PROGRESS NOTE    Beverly Wheeler  ZOX:[id number] DOB: Sep 02, 1945 DOA: 04/08/2023 PCP: [place], Betty G, MD   Brief Narrative: 77 year old with past medical history significant for COPD, chronic hypoxic respiratory failure on 2 L of oxygen, CAD, CHF, CKD stage III, hypertension, hyperlipidemia, IDA who presents with progressive fatigue, worsening shortness of breath and cough.  Former smoker 40 pack years.  She has been using home inhalers and diuretic as prescribed.  Patient admitted with acute on chronic systolic and diastolic heart failure exacerbation, ischemic cardiomyopathy, COPD exacerbation.  She was started on diuretics and IV steroids.  Subsequently transition to oral prednisone.  Cardiology consulted and has been assisting with management of heart failure exacerbation.   Assessment & Plan:   Principal Problem:   COPD exacerbation (HCC)  1-Acute on Chronic Systolic and Diastolic heart failure exacerbation Pulmonary hypertension Ischemic cardiomyopathy, refused CABG in the past Patient presented with shortness of breath, elevation BNP 626, chest x-ray with pulmonary vascular congestion. -Echo showed ejection fraction 25 to 30%, global hypokinesis, grade 2 diastolic dysfunction, reduced ejection fraction from previous. -Cardiology recommended CABG in 2015, refused surgery at that time. -She received IV Lasix, held due to increased creatinine Appreciate cardiology recommendation.  Plan to start ARB today --1.3 L. -Weight : 205----190  2-COPD exacerbation, acute on chronic hypoxic hypercarbic respiratory failure -Back on 2 L of oxygen He had declined BiPAP Treated initially with IV Solu-Medrol, now on prednisone for total 5 days.  Complete her azithromycin. Continue DuoNeb, Brovana Pulmicort Plan to complete ceftriaxone for 5 days.   Hypomagnesemia: Replace  CAD, hyperlipidemia Elevation of troponin LDL 129 Continue aspirin, atorvastatin, Zetia -Was recommended back in  2015, patient declined surgery. Medical management  AKI on CKD 3A: In the setting of diuretics   History of iron deficiency anemia: Continue with iron supplement  Diabetes type 2, hyperglycemia:  SSI.   Mood disorder: Continue with Lexapro  Obesity: Need lifestyle modification      Estimated body mass index is 35.95 kg/m as calculated from the following:   Height as of this encounter: 5\' 1"  (1.549 m).   Weight as of this encounter: 86.3 kg.   DVT prophylaxis: Lovenox Code Status: Full code Family Communication: care discussed with patient.  Disposition Plan:  Status is: Inpatient Remains inpatient appropriate because: management of HF exacerbation, home soon when clear by cardiology     Consultants:  Cardiology   Procedures:  ECHO  Antimicrobials:    Subjective: She is alert and conversant, she is feeling better, breathing has improved.  She reports mild cough  Objective: Vitals:   04/11/23 1949 04/11/23 2310 04/12/23 0411 04/12/23 0714  BP: (!) 142/54 (!) 126/52 (!) 116/49 (!) 129/51  Pulse: 62  61 (!) 192  Resp: 20  14 14   Temp: 98.2 F (36.8 C) 98.2 F (36.8 C) 97.7 F (36.5 C) 97.8 F (36.6 C)  TempSrc: Oral Oral Oral Oral  SpO2: 99%  100% 95%  Weight:   86.3 kg   Height:        Intake/Output Summary (Last 24 hours) at 04/12/2023 0919 Last data filed at 04/12/2023 0800 Gross per 24 hour  Intake 458.28 ml  Output 550 ml  Net -91.72 ml   Filed Weights   04/10/23 0400 04/11/23 0450 04/12/23 0411  Weight: 89.5 kg 88.6 kg 86.3 kg    Examination:  General exam: Appears calm and comfortable  Respiratory system: Clear to auscultation. Respiratory effort normal. Cardiovascular system: S1 & S2 heard,  RRR.  Gastrointestinal system: Abdomen is nondistended, soft and nontender. No organomegaly or masses felt. Normal bowel sounds heard. Central nervous system: Alert and oriented.  Extremities: Symmetric 5 x 5 power.    Data Reviewed: I have  personally reviewed following labs and imaging studies  CBC: Recent Labs  Lab 04/08/23 1843 04/08/23 1852 04/08/23 2353 04/10/23 0222 04/11/23 0233 04/12/23 0247  WBC 11.3*  --  11.4* 11.0* 10.3 10.2  NEUTROABS 9.2*  --   --  8.9* 8.1* 7.2  HGB 9.2* 9.5* 9.0* 8.2* 8.5* 8.9*  HCT 29.2* 28.0* 28.5* 24.4* 26.1* 27.5*  MCV 99.0  --  101.1* 96.8 97.8 98.2  PLT 200  --  187 185 184 184   Basic Metabolic Panel: Recent Labs  Lab 04/08/23 1843 04/08/23 1852 04/09/23 0244 04/10/23 0222 04/11/23 0233 04/12/23 0247  NA 140 139 138 134* 137 139  K 4.6 4.6 4.7 3.9 4.4 4.0  CL 90*  --  89* 88* 90* 91*  CO2 38*  --  32 35* 39* 39*  GLUCOSE 133*  --  263* 177* 145* 129*  BUN 17  --  18 26* 25* 26*  CREATININE 1.23*  --  1.24* 1.68* 1.37* 1.34*  CALCIUM 8.9  --  8.8* 8.1* 8.6* 8.7*  MG 1.7  --  1.5* 1.9  --   --   PHOS  --   --  3.1  --   --   --    GFR: Estimated Creatinine Clearance: 36.7 mL/min (A) (by C-G formula based on SCr of 1.34 mg/dL (H)). Liver Function Tests: Recent Labs  Lab 04/08/23 1843  AST 14*  ALT 15  ALKPHOS 72  BILITOT 0.5  PROT 6.3*  ALBUMIN 3.1*   No results for input(s): "LIPASE", "AMYLASE" in the last 168 hours. No results for input(s): "AMMONIA" in the last 168 hours. Coagulation Profile: No results for input(s): "INR", "PROTIME" in the last 168 hours. Cardiac Enzymes: No results for input(s): "CKTOTAL", "CKMB", "CKMBINDEX", "TROPONINI" in the last 168 hours. BNP (last 3 results) No results for input(s): "PROBNP" in the last 8760 hours. HbA1C: No results for input(s): "HGBA1C" in the last 72 hours. CBG: Recent Labs  Lab 04/11/23 0626 04/11/23 1118 04/11/23 1627 04/11/23 2112 04/12/23 0609  GLUCAP 132* 220* 177* 250* 114*   Lipid Profile: No results for input(s): "CHOL", "HDL", "LDLCALC", "TRIG", "CHOLHDL", "LDLDIRECT" in the last 72 hours. Thyroid Function Tests: No results for input(s): "TSH", "T4TOTAL", "FREET4", "T3FREE", "THYROIDAB"  in the last 72 hours. Anemia Panel: No results for input(s): "VITAMINB12", "FOLATE", "FERRITIN", "TIBC", "IRON", "RETICCTPCT" in the last 72 hours. Sepsis Labs: Recent Labs  Lab 04/10/23 0222  PROCALCITON <0.10    Recent Results (from the past 240 hour(s))  Resp panel by RT-PCR (RSV, Flu A&B, Covid) Anterior Nasal Swab     Status: None   Collection Time: 04/08/23  6:42 PM   Specimen: Anterior Nasal Swab  Result Value Ref Range Status   SARS Coronavirus 2 by RT PCR NEGATIVE NEGATIVE Final   Influenza A by PCR NEGATIVE NEGATIVE Final   Influenza B by PCR NEGATIVE NEGATIVE Final    Comment: (NOTE) The Xpert Xpress SARS-CoV-2/FLU/RSV plus assay is intended as an aid in the diagnosis of influenza from Nasopharyngeal swab specimens and should not be used as a sole basis for treatment. Nasal washings and aspirates are unacceptable for Xpert Xpress SARS-CoV-2/FLU/RSV testing.  Fact Sheet for Patients: BloggerCourse.com  Fact Sheet for Healthcare Providers: SeriousBroker.it  This  test is not yet approved or cleared by the [place] and has been authorized for detection and/or diagnosis of SARS-CoV-2 by FDA under an Emergency Use Authorization (EUA). This EUA will remain in effect (meaning this test can be used) for the duration of the COVID-19 declaration under Section 564(b)(1) of the Act, 21 U.S.C. section 360bbb-3(b)(1), unless the authorization is terminated or revoked.     Resp Syncytial Virus by PCR NEGATIVE NEGATIVE Final    Comment: (NOTE) Fact Sheet for Patients: BloggerCourse.com  Fact Sheet for Healthcare Providers: SeriousBroker.it  This test is not yet approved or cleared by the [place] FDA and has been authorized for detection and/or diagnosis of SARS-CoV-2 by FDA under an Emergency Use Authorization (EUA). This EUA will remain in effect (meaning this  test can be used) for the duration of the COVID-19 declaration under Section 564(b)(1) of the Act, 21 U.S.C. section 360bbb-3(b)(1), unless the authorization is terminated or revoked.  Performed at [facility], [address] [place], [place] [address]   SARS Coronavirus 2 by RT PCR (hospital order, performed in [facility] hospital lab) *cepheid single result test* Anterior Nasal Swab     Status: None   Collection Time: 04/08/23  6:43 PM   Specimen: Anterior Nasal Swab  Result Value Ref Range Status   SARS Coronavirus 2 by RT PCR NEGATIVE NEGATIVE Final    Comment: Performed at [facility], [address] [place], [place] [address]  MRSA Next Gen by PCR, Nasal     Status: None   Collection Time: 04/09/23 12:24 AM   Specimen: Nasal Mucosa; Nasal Swab  Result Value Ref Range Status   MRSA by PCR Next Gen NOT DETECTED NOT DETECTED Final    Comment: (NOTE) The GeneXpert MRSA Assay (FDA approved for NASAL specimens only), is one component of a comprehensive MRSA colonization surveillance program. It is not intended to diagnose MRSA infection nor to guide or monitor treatment for MRSA infections. Test performance is not FDA approved in patients less than 71 years old. Performed at [facility], [address], [place], [place] [address]          Radiology Studies: No results found.      Scheduled Meds:  arformoterol  15 mcg Nebulization BID   aspirin EC  81 mg Oral Daily   atorvastatin  80 mg Oral QHS   budesonide (PULMICORT) nebulizer solution  0.25 mg Nebulization BID   cyanocobalamin  1,000 mcg Oral Daily   enoxaparin (LOVENOX) injection  40 mg Subcutaneous Q24H   escitalopram  20 mg Oral Daily   ezetimibe  10 mg Oral Daily   ferrous gluconate  324 mg Oral BID WC   insulin aspart  0-6 Units Subcutaneous TID WC   loratadine  10 mg Oral Daily   losartan  12.5 mg Oral Daily   montelukast  10 mg Oral QHS   nebivolol  5 mg Oral Daily    pantoprazole  40 mg Oral Daily   predniSONE  40 mg Oral Q breakfast   sodium chloride flush  3 mL Intravenous Q12H   triamcinolone  2 spray Nasal Daily   Continuous Infusions:  cefTRIAXone (ROCEPHIN)  IV Stopped (04/11/23 1820)     LOS: 4 days    Time spent: 35 minutes.     Alba Cory, MD [facility]   If 7PM-7AM, please contact night-coverage [email or web address]  04/12/2023, 9:19 AM

## 2023-04-12 NOTE — Progress Notes (Signed)
   Heart Failure Stewardship Pharmacist Progress Note   PCP: [place], Betty G, MD PCP-Cardiologist: Thurmon Fair, MD    HPI:  77 yo F with PMH of CAD, CHF, HTN, HLD, diabetes, CKD III, and COPD.  In 2015, admitted for STEMI. Underwent cath that showed significant 3 vessel disease. EF 30-35%. She declined CABG. ECHO 11/2017 showed EF 35-40%.   Admitted on 9/14 with weakness and shortness of breath. CXR with pulmonary vascular congestion. BNP elevated. ECHO on 9/15 showed LVEF reduced further to 25-30%, global hypokinesis, G2DD, RV normal, mild to moderate MR, mild AS. She is being treated for COPD and CHF exacerbation.   Current HF Medications: ACE/ARB/ARNI: losartan 12.5 mg daily  Prior to admission HF Medications: Diuretic: furosemide 40 mg daily  Pertinent Lab Values: Serum creatinine 1.34, BUN 26, Potassium 4.0, Sodium 139, BNP 626.5, Magnesium 1.9  Vital Signs: Weight: 190 lbs (admission weight: 189 lbs) Blood pressure: 120/50s  Heart rate: 60s  I/O: net -1.6L since admission  Medication Assistance / Insurance Benefits Check: Does the patient have prescription insurance?  Yes Type of insurance plan: UHC Medicare + [employer]  Outpatient Pharmacy:  [facility] the patient willing to use [facility] TOC pharmacy at discharge? Yes Is the patient willing to transition their outpatient pharmacy to utilize a [facility] outpatient pharmacy?   Yes - transitioning to [facility] mail order    Assessment: 1. Acute on chronic systolic CHF (LVEF 25-30%), due to ICM. NYHA class II symptoms. - Holding IV lasix with AKI. Creatinine 1.24>1.68>1.37>1.34. Strict I/Os and daily weights. Keep K>4 and Mg>2. - BB limited with lung disease, on nebivolol - Agree with starting losartan 12.5 mg daily - Poor candidate for SGLT2i - GDMT limited in the setting of chronic respiratory failure, renal disease, and poor hygiene.    Plan: 1) Medication changes recommended at this  time: - Agree with changes  2) Patient assistance: - None pending, has Medicare + [employer] - States that sometimes it is difficult to pay for 90 day prescriptions and relies on her son to pick up her medications from the pharmacy - Agreeable to switch to [facility] mail order pharmacy. Address and phone number confirmed.  - [facility] and [facility] they can [facility] the more expensive prescriptions to her home.  3)  Education  - Patient has been educated on current HF medications and potential additions to HF medication regimen - Patient verbalizes understanding that over the next few months, these medication doses may change and more medications may be added to optimize HF regimen - Patient has been educated on basic disease state pathophysiology and goals of therapy   Sharen Hones, PharmD, BCPS Heart Failure Stewardship Pharmacist Phone [phone]

## 2023-04-12 NOTE — TOC Initial Note (Addendum)
Transition of Care [facility]) - Initial/Assessment Note    Patient Details  Name: Beverly Wheeler MRN: [id number] Date of Birth: 06/19/1946  Transition of [facility]) CM/SW Contact:    Kingsley Plan, RN Phone Number: 04/12/2023, 2:32 PM  Clinical Narrative:                  Spoke to patient at bedside.   Patient from home with son.   Orders for HHRN,PT,SW patient has had [facility] in the past and would like them again. Left message for [facility] with [facility]. Kandee Keen with [facility] accepted referral   Patient has Rollator and oxygen at home.   Patient unsure of name of oxygen company but has portable oxygen DME .   NCM asked if patient wanted NCM to call son , patient declined  Expected Discharge Plan: Home w Home Health Services Barriers to Discharge: Continued Medical Work up   Patient Goals and CMS Choice Patient states their goals for this hospitalization and ongoing recovery are:: to return to home CMS Medicare.gov Compare Post Acute Care list provided to:: Patient Choice offered to / list presented to : Patient [place] ownership interest in [facility]provided to:: Patient    Expected Discharge Plan and Services   Discharge Planning Services: CM Consult Post Acute Care Choice: Home Health Living arrangements for the past 2 months: Single Family Home                 DME Arranged: N/A         HH Arranged: RN, PT, Social Work (see note)          Prior Living Arrangements/Services Living arrangements for the past 2 months: Single Family Home Lives with:: Adult Children Patient language and need for interpreter reviewed:: Yes Do you feel safe going back to the place where you live?: Yes      Need for Family Participation in Patient Care: Yes (Comment) Care giver support system in place?: Yes (comment) Current home services: DME Criminal Activity/Legal Involvement Pertinent to Current Situation/Hospitalization: No - Comment as needed  Activities of Daily  Living Home Assistive Devices/Equipment: Eyeglasses, Oxygen, Walker (specify type) ADL Screening (condition at time of admission) Patient's cognitive ability adequate to safely complete daily activities?: Yes Is the patient deaf or have difficulty hearing?: No Does the patient have difficulty seeing, even when wearing glasses/contacts?: No Does the patient have difficulty concentrating, remembering, or making decisions?: No Patient able to express need for assistance with ADLs?: Yes Does the patient have difficulty dressing or bathing?: Yes Independently performs ADLs?: No Communication: Independent Dressing (OT): Needs assistance Is this a change from baseline?: Change from baseline, expected to last <3days Grooming: Needs assistance Is this a change from baseline?: Change from baseline, expected to last <3 days Feeding: Independent Bathing: Needs assistance Is this a change from baseline?: Change from baseline, expected to last >3 days Toileting: Needs assistance Is this a change from baseline?: Change from baseline, expected to last >3days In/Out Bed: Needs assistance Is this a change from baseline?: Change from baseline, expected to last >3 days Walks in Home: Independent with device (comment) (walker) Does the patient have difficulty walking or climbing stairs?: Yes Weakness of Legs: Both Weakness of Arms/Hands: Both  Permission Sought/Granted   Permission granted to share information with : Yes, Verbal Permission Granted     Permission granted to share info w AGENCY: [facility]        Emotional Assessment Appearance:: Appears stated age Attitude/Demeanor/Rapport:  Engaged Affect (typically observed): Accepting Orientation: : Oriented to Self, Oriented to Place, Oriented to  Time, Oriented to Situation Alcohol / Substance Use: Not Applicable Psych Involvement: No (comment)  Admission diagnosis:  COPD exacerbation (HCC) [J44.1] Patient Active Problem List   Diagnosis Date  Noted   COPD exacerbation (HCC) 04/08/2023   Pyuria 01/14/2022   Chronic respiratory failure with hypoxia and hypercapnia (HCC) 01/14/2022   Hyperammonemia (HCC) 01/14/2022   Diabetes mellitus (HCC) 05/19/2020   Venous stasis dermatitis of both lower extremities 01/08/2018   Iron deficiency anemia 01/08/2018   Acute respiratory failure with hypoxia (HCC)    Hypokalemia    Pressure injury of skin 11/24/2017   Acute kidney injury (HCC)    Cellulitis of left lower extremity    Sepsis (HCC)    Septic shock (HCC)    Hypovolemic shock (HCC)    Hypotension 11/23/2017   CKD (chronic kidney disease), stage III 11/22/2017   CAD, multiple vessel 10/30/2013   Pre-op evaluation 10/17/2013   Myocardial infarction (HCC) 10/16/2013   Acute MI (HCC) 10/16/2013   OVERWEIGHT 12/10/2009   CIGARETTE SMOKER, has stopped smoking 12/10/2009   Essential hypertension 12/10/2009   DEGENERATIVE JOINT DISEASE 12/10/2009   HOARSENESS, CHRONIC 12/10/2009   CHEST PAIN UNSPECIFIED 12/10/2009   Hyperlipidemia, mixed 01/24/2008   Anxiety disorder, unspecified 01/24/2008   Allergic rhinitis 01/24/2008   COPD  GOLD II based on fev1/VC of 58%  01/24/2008   GERD 01/24/2008   POSITIVE PPD 01/24/2008   PCP:  [place], Betty G, MD [facility]acy[facility]   [phone] [place] [place] - [address] AT [address] [facility] [place] [address] Phone: [phone] Fax: [phone]  [facility] [address] [place] [address] Phone: [phone] Fax: [phone]     Social Determinants of Health (SDOH) Social History: SDOH Screenings   Food Insecurity: No Food Insecurity (04/09/2023)  Housing: Patient Declined (04/12/2023)  Transportation Needs: No Transportation Needs (04/12/2023)  Utilities: At Risk (04/09/2023)  Alcohol Screen: Low Risk  (04/12/2023)  Depression (PHQ2-9): Low Risk  (05/02/2022)  Financial Resource Strain: Low Risk   (04/12/2023)  Physical Activity: Inactive (05/02/2022)  Social Connections: Socially Isolated (04/29/2021)  Stress: No Stress Concern Present (05/02/2022)  Tobacco Use: Medium Risk (04/08/2023)   SDOH Interventions: Housing Interventions: Intervention Not Indicated Transportation Interventions: Intervention Not Indicated Alcohol Usage Interventions: Intervention Not Indicated (Score <7) Financial Strain Interventions: Intervention Not Indicated   Readmission Risk Interventions     No data to display

## 2023-04-12 NOTE — Progress Notes (Signed)
Rounding Note    Patient Name: Beverly Wheeler Date of Encounter: 04/12/2023  [facility] Cardiologist: Thurmon Fair, MD   Subjective   No acute events, no chest pain.  Inpatient Medications    Scheduled Meds:  arformoterol  15 mcg Nebulization BID   aspirin EC  81 mg Oral Daily   atorvastatin  80 mg Oral QHS   budesonide (PULMICORT) nebulizer solution  0.25 mg Nebulization BID   cyanocobalamin  1,000 mcg Oral Daily   enoxaparin (LOVENOX) injection  40 mg Subcutaneous Q24H   escitalopram  20 mg Oral Daily   ezetimibe  10 mg Oral Daily   ferrous gluconate  324 mg Oral BID WC   insulin aspart  0-6 Units Subcutaneous TID WC   loratadine  10 mg Oral Daily   losartan  12.5 mg Oral Daily   montelukast  10 mg Oral QHS   nebivolol  5 mg Oral Daily   pantoprazole  40 mg Oral Daily   predniSONE  40 mg Oral Q breakfast   sodium chloride flush  3 mL Intravenous Q12H   triamcinolone  2 spray Nasal Daily   Continuous Infusions:  cefTRIAXone (ROCEPHIN)  IV Stopped (04/11/23 1820)   PRN Meds: acetaminophen, albuterol, alum & mag hydroxide-simeth   Vital Signs    Vitals:   04/11/23 1949 04/11/23 2310 04/12/23 0411 04/12/23 0714  BP: (!) 142/54 (!) 126/52 (!) 116/49 (!) 129/51  Pulse: 62  61 (!) 192  Resp: 20  14 14   Temp: 98.2 F (36.8 C) 98.2 F (36.8 C) 97.7 F (36.5 C) 97.8 F (36.6 C)  TempSrc: Oral Oral Oral Oral  SpO2: 99%  100% 95%  Weight:   86.3 kg   Height:        Intake/Output Summary (Last 24 hours) at 04/12/2023 0851 Last data filed at 04/12/2023 0413 Gross per 24 hour  Intake 218.28 ml  Output 550 ml  Net -331.72 ml      04/12/2023    4:11 AM 04/11/2023    4:50 AM 04/10/2023    4:00 AM  Last 3 Weights  Weight (lbs) 190 lb 4.1 oz 195 lb 5.2 oz 197 lb 5 oz  Weight (kg) 86.3 kg 88.6 kg 89.5 kg      Telemetry    SR - Personally Reviewed  Physical Exam   GEN: No acute distress.  On O2 by nasal cannula Neck:  unable to assess 2/2  body habitus Cardiac: RRR, no murmurs, rubs, or gallops.  Respiratory: distant breath sounds bilaterally GI: Soft, nontender, non-distended  MS: No edema; No deformity. Neuro:  Nonfocal  Psych: Normal affect   New pertinent results (labs, ECG, imaging, cardiac studies)    echo shows that EF now 25-30%, was 35-40% in 2019.   Patient Profile     77 y.o. female with complex PMH, including CAD recommended for CABG in 2015, chronic systolic and diastolic heart failure, chronic hypoxic respiratory failure on home O2 whom cardiology is asked to see for worsening EF on echo.   Assessment & Plan    CAD Chronic systolic and diastolic heart failure Ischemic cardiomyopathy -recommended for CABG in 2015, cath has been extensively reviewed previously and not recommended for PCI -she declined CABG at that time. Given her comorbidities, lack of symptoms, and prior preferences to avoid surgery, would manage medically. She is in agreement with this. -no lasix given Cr bump, exam. Limited options for GDMT. On nebivolol, tolerating.  -will trial very  low dose of losartan today. Monitor Cr, BP -with risk of UTI, largely bedbound, would avoid SGLT2i at this time -continue aspirin, statin, ezetimibe  AKI on CKD stage 3a -Cr downtrending slightly today to 1.34. Was 1 a year ago and 1.23 on admission   Chronic hypoxemic respiratory failure, on home O2 COPD CAP -per primary team    Signed, Jodelle Red, MD  04/12/2023, 8:51 AM

## 2023-04-12 NOTE — Progress Notes (Signed)
No indication for PRN BIPAP at this time. Machine not in room.

## 2023-04-12 NOTE — Plan of Care (Signed)
  Problem: Education: Goal: Ability to demonstrate management of disease process will improve Outcome: Progressing Goal: Ability to verbalize understanding of medication therapies will improve Outcome: Progressing Goal: Individualized Educational Video(s) Outcome: Progressing   Problem: Cardiac: Goal: Ability to achieve and maintain adequate cardiopulmonary perfusion will improve Outcome: Progressing   Problem: Education: Goal: Knowledge of disease or condition will improve Outcome: Progressing Goal: Knowledge of the prescribed therapeutic regimen will improve Outcome: Progressing Goal: Individualized Educational Video(s) Outcome: Progressing   Problem: Activity: Goal: Ability to tolerate increased activity will improve Outcome: Progressing Goal: Will verbalize the importance of balancing activity with adequate rest periods Outcome: Progressing   Problem: Respiratory: Goal: Ability to maintain a clear airway will improve Outcome: Progressing Goal: Levels of oxygenation will improve Outcome: Progressing Goal: Ability to maintain adequate ventilation will improve Outcome: Progressing   Problem: Coping: Goal: Ability to adjust to condition or change in health will improve Outcome: Progressing   Problem: Health Behavior/Discharge Planning: Goal: Ability to identify and utilize available resources and services will improve Outcome: Progressing Goal: Ability to manage health-related needs will improve Outcome: Progressing   Problem: Metabolic: Goal: Ability to maintain appropriate glucose levels will improve Outcome: Progressing   Problem: Nutritional: Goal: Maintenance of adequate nutrition will improve Outcome: Progressing Goal: Progress toward achieving an optimal weight will improve Outcome: Progressing   Problem: Skin Integrity: Goal: Risk for impaired skin integrity will decrease Outcome: Progressing   Problem: Tissue Perfusion: Goal: Adequacy of tissue  perfusion will improve Outcome: Progressing   Problem: Health Behavior/Discharge Planning: Goal: Ability to manage health-related needs will improve Outcome: Progressing   Problem: Clinical Measurements: Goal: Ability to maintain clinical measurements within normal limits will improve Outcome: Progressing Goal: Will remain free from infection Outcome: Progressing Goal: Diagnostic test results will improve Outcome: Progressing Goal: Respiratory complications will improve Outcome: Progressing Goal: Cardiovascular complication will be avoided Outcome: Progressing   Problem: Activity: Goal: Risk for activity intolerance will decrease Outcome: Progressing

## 2023-04-12 NOTE — Progress Notes (Signed)
Heart Failure Nurse Navigator Progress Note  PCP: [place], Betty G, MD PCP-Cardiologist: [place] Admission Diagnosis: COPD exacerbation Admitted from: Home via EMS  Presentation:   Beverly Wheeler presented with upon waking in the morning her legs wouldn't work". And shortness of breath, BNP 626.5, CXR with pulmonary congestion, Patient was educated on the sign and symptoms of heart failure, daily weights, when to call her doctor or go to the ED, Diet/ fluid restrictions, taking all medications as prescribed, increasing he r mobility and attending all medical appointments. Patient verbalized her understanding. A HF TOC appointment was scheduled for 04/27/2023 @ 2 pm.   ECHO/ LVEF: 25-30% G2DD  Clinical Course:  Past Medical History:  Diagnosis Date   Allergy    Asthma    CAD (coronary artery disease)    COPD (chronic obstructive pulmonary disease) (HCC)    Depression    GERD (gastroesophageal reflux disease)    Hyperlipidemia    Hypertension    Myocardial infarction (HCC) 10/16/2013     Social History   Socioeconomic History   Marital status: Widowed    Spouse name: Not on file   Number of children: 1   Years of education: Not on file   Highest education level: High school graduate  Occupational History   Occupation: retired  Tobacco Use   Smoking status: Former    Current packs/day: 0.00    Average packs/day: 1 pack/day for 45.0 years (45.0 ttl pk-yrs)    Types: Cigarettes    Start date: 10/16/1968    Quit date: 10/16/2013    Years since quitting: 9.4   Smokeless tobacco: Never  Vaping Use   Vaping status: Never Used  Substance and Sexual Activity   Alcohol use: Not Currently    Comment: occasional   Drug use: No   Sexual activity: Not Currently  Other Topics Concern   Not on file  Social History Narrative   Not on file   Social Determinants of Health   Financial Resource Strain: Low Risk  (04/12/2023)   Overall Financial Resource Strain (CARDIA)     Difficulty of Paying Living Expenses: Not hard at all  Food Insecurity: No Food Insecurity (04/09/2023)   Hunger Vital Sign    Worried About Running Out of Food in the Last Year: Never true    Ran Out of Food in the Last Year: Never true  Transportation Needs: No Transportation Needs (04/12/2023)   PRAPARE - Administrator, Civil Service (Medical): No    Lack of Transportation (Non-Medical): No  Physical Activity: Inactive (05/02/2022)   Exercise Vital Sign    Days of Exercise per Week: 0 days    Minutes of Exercise per Session: 0 min  Stress: No Stress Concern Present (05/02/2022)   [employer] of Occupational Health - Occupational Stress Questionnaire    Feeling of Stress : Not at all  Social Connections: Socially Isolated (04/29/2021)   Social Connection and Isolation Panel [NHANES]    Frequency of Communication with Friends and Family: Twice a week    Frequency of Social Gatherings with Friends and Family: Twice a week    Attends Religious Services: Never    Database administrator or Organizations: No    Attends Banker Meetings: Never    Marital Status: Widowed   Education Assessment and Provision:  Detailed education and instructions provided on heart failure disease management including the following:  Signs and symptoms of Heart Failure When to call the physician Importance  of daily weights Low sodium diet Fluid restriction Medication management Anticipated future follow-up appointments  Patient education given on each of the above topics.  Patient acknowledges understanding via teach back method and acceptance of all instructions.  Education Materials:  "Living Better With Heart Failure" Booklet, HF zone tool, & Daily Weight Tracker Tool.  Patient has scale at home: Yes Patient has pill box at home: Yes    High Risk Criteria for Readmission and/or Poor Patient Outcomes: Heart failure hospital admissions (last 6 months): 0  No Show rate:  13% Difficult social situation: No Demonstrates medication adherence: Yes Primary Language: English Literacy level: Reading, writting, and comprehension  Barriers of Care:   Diet/ fluid restrictions Daily weights Mobility ( needs encouraging)   Considerations/Referrals:   Referral made to Heart Failure Pharmacist Stewardship: Yes Referral made to Heart Failure CSW/NCM TOC: No  referral made to Heart & Vascular TOC clinic: Yes, 04/27/2023 @ 2pm.   Items for Follow-up on DC/TOC: Diet/ fluid restrictions/ daily weights Continued HF education   Rhae Hammock, BSN, RN Heart Failure Print production planner Chat Only

## 2023-04-12 NOTE — Evaluation (Signed)
Physical Therapy Evaluation Patient Details Name: Beverly Wheeler MRN: [id number] DOB: 01-Aug-1945 Today's Date: 04/12/2023  History of Present Illness  77 y.o. female with hx of COPD, chronic hypoxic respiratory failure on 2 L O2, coronary artery disease, MI, heart failure with reduced ejection fraction, CKD 3, hypertension, diabetes, IDA, who presents with few days of progressive fatigue, new shortness of breath and cough   Clinical Impression  PTA amb within home without AD and rollator for community, was on 2LO2 via [place], needed some help for transfer in/out of tub and lower body dressing. Pt now with generalized weakness, decreased activity tolerance, deconditioning, DOE with activity and SPO2 dec to 84% during 20' of amb with rollator on 3LO2 via [place]. Pt requires use of rollator at this time. Acute PT to cont to follow. Suspect pt sedentary at baseline however was amb without AD PTA. PT to continue to assess d/c recommendations as we may need to look into SNF to allow for increased time to address above deficits.        If plan is discharge home, recommend the following: A little help with walking and/or transfers;A little help with bathing/dressing/bathroom;Assistance with cooking/housework;Assist for transportation;Help with stairs or ramp for entrance   Can travel by private vehicle        Equipment Recommendations None recommended by PT  Recommendations for Other Services       Functional Status Assessment Patient has had a recent decline in their functional status and demonstrates the ability to make significant improvements in function in a reasonable and predictable amount of time.     Precautions / Restrictions Precautions Precautions: Fall Precaution Comments: Watch O2 Restrictions Weight Bearing Restrictions: No      Mobility  Bed Mobility Overal bed mobility: Needs Assistance Bed Mobility: Supine to Sit     Supine to sit: Mod assist     General bed mobility  comments: pt able to bring LEs off EOB with cues, pt requesting help to "pull her up", modA for trunk elevation and to scoot to EOB. Pt with noted DOE, SpO2 at 90% on 3LO2 via [place]    Transfers Overall transfer level: Needs assistance Equipment used: Rollator (4 wheels) Transfers: Sit to/from Stand, Bed to chair/wheelchair/BSC Sit to Stand: Min assist Stand pivot transfers: Min assist         General transfer comment: minA to power up, verbal cues to push up from bed not pull up on walker, verbal cues to lock breaks on rollator prior to standing up to it, completed 4 sit to stands    Ambulation/Gait Ambulation/Gait assistance: Min assist Gait Distance (Feet): 20 Feet Assistive device: Rollator (4 wheels) Gait Pattern/deviations: Step-to pattern, Decreased stride length, Narrow base of support Gait velocity: dec Gait velocity interpretation: <1.31 ft/sec, indicative of household ambulator   General Gait Details: pt with noted instability and reports "I just feel weak and unsteady" Pts rollator way to high for pts, PT adjust height to optimal level to support pt, minA to manage rollator due to increased motion with 4 wheels, SpO2 dec to 84% on 3Lo2 via [place]  Stairs            Wheelchair Mobility     Tilt Bed    Modified Rankin (Stroke Patients Only)       Balance Overall balance assessment: Needs assistance Sitting-balance support: Feet supported Sitting balance-Leahy Scale: Fair     Standing balance support: Reliant on assistive device for balance Standing balance-Leahy Scale: Poor Standing  balance comment: pt fatigues quickly                             Pertinent Vitals/Pain Pain Assessment Pain Assessment: No/denies pain    Home Living Family/patient expects to be discharged to:: Private residence Living Arrangements: Children Available Help at Discharge: Family;Available 24 hours/day Type of Home: House Home Access: Stairs to enter Entrance  Stairs-Rails: Right Entrance Stairs-Number of Steps: 3 Alternate Level Stairs-Number of Steps: 4-6 Home Layout: Multi-level;Able to live on main level with bedroom/bathroom Home Equipment: Rollator (4 wheels);Wheelchair - Lawyer Comments: 2-3L O2    Prior Function Prior Level of Function : Needs assist             Mobility Comments: Generally furniture surfs at home, but does use 4WRW at times, doesn't drive ADLs Comments: Son assists with iADL, meds and meals and community mobility.  Patient takes baths, and needs occasional assist with lower body ADL and to step into tub     Extremity/Trunk Assessment   Upper Extremity Assessment Upper Extremity Assessment: Defer to OT evaluation (pt with noted decresaed bilat shld flexion as noted when raising UEs up to RW)    Lower Extremity Assessment Lower Extremity Assessment: Generalized weakness    Cervical / Trunk Assessment Cervical / Trunk Assessment: Kyphotic  Communication   Communication Communication: No apparent difficulties (pt with no teeth and muffled speech)  Cognition Arousal: Alert Behavior During Therapy: WFL for tasks assessed/performed Overall Cognitive Status: Within Functional Limits for tasks assessed                                 General Comments: pt with decreased insight to safety however suspect this to be baseline        General Comments General comments (skin integrity, edema, etc.): SpO2 dec to 84% on 3lO2 via [place]    Exercises     Assessment/Plan    PT Assessment Patient needs continued PT services  PT Problem List Decreased strength;Decreased activity tolerance;Decreased balance;Decreased mobility;Decreased coordination;Decreased cognition       PT Treatment Interventions DME instruction;Gait training;Stair training;Functional mobility training;Therapeutic activities;Therapeutic exercise;Balance training;Neuromuscular re-education    PT Goals (Current  goals can be found in the Care Plan section)  Acute Rehab PT Goals Patient Stated Goal: breath better PT Goal Formulation: With patient Time For Goal Achievement: 04/26/23 Potential to Achieve Goals: Good    Frequency Min 1X/week     Co-evaluation               AM-PAC PT "6 Clicks" Mobility  Outcome Measure Help needed turning from your back to your side while in a flat bed without using bedrails?: A Little Help needed moving from lying on your back to sitting on the side of a flat bed without using bedrails?: A Little Help needed moving to and from a bed to a chair (including a wheelchair)?: A Little Help needed standing up from a chair using your arms (e.g., wheelchair or bedside chair)?: A Little Help needed to walk in hospital room?: A Lot Help needed climbing 3-5 steps with a railing? : A Lot 6 Click Score: 16    End of Session Equipment Utilized During Treatment: Gait belt;Oxygen (3LO2 via [place]) Activity Tolerance: Patient limited by fatigue Patient left: in chair;with call bell/phone within reach;with chair alarm set Nurse Communication: Mobility status (SpO2 saturation) PT  Visit Diagnosis: Unsteadiness on feet (R26.81);Muscle weakness (generalized) (M62.81);Difficulty in walking, not elsewhere classified (R26.2)    Time: 8119-1478 PT Time Calculation (min) (ACUTE ONLY): 31 min   Charges:   PT Evaluation $PT Eval Moderate Complexity: 1 Mod PT Treatments $Gait Training: 8-22 mins PT General Charges $$ ACUTE PT VISIT: 1 Visit         Lewis Shock, PT, DPT Acute Rehabilitation Services Secure chat preferred Office #: [phone]   Iona Hansen 04/12/2023, 11:34 AM

## 2023-04-13 DIAGNOSIS — I255 Ischemic cardiomyopathy: Secondary | ICD-10-CM | POA: Diagnosis not present

## 2023-04-13 LAB — GLUCOSE, CAPILLARY
Glucose-Capillary: 121 mg/dL — ABNORMAL HIGH (ref 70–99)
Glucose-Capillary: 215 mg/dL — ABNORMAL HIGH (ref 70–99)
Glucose-Capillary: 195 mg/dL — ABNORMAL HIGH (ref 70–99)
Glucose-Capillary: 186 mg/dL — ABNORMAL HIGH (ref 70–99)

## 2023-04-13 LAB — BASIC METABOLIC PANEL
Anion gap: 11 (ref 5–15)
BUN: 26 mg/dL — ABNORMAL HIGH (ref 8–23)
CO2: 35 mmol/L — ABNORMAL HIGH (ref 22–32)
Calcium: 8.7 mg/dL — ABNORMAL LOW (ref 8.9–10.3)
Chloride: 90 mmol/L — ABNORMAL LOW (ref 98–111)
Creatinine, Ser: 1.42 mg/dL — ABNORMAL HIGH (ref 0.44–1.00)
GFR, Estimated: 39 mL/min — ABNORMAL LOW (ref >60)
Glucose, Bld: 206 mg/dL — ABNORMAL HIGH (ref 70–99)
Potassium: 4.5 mmol/L (ref 3.5–5.1)
Sodium: 136 mmol/L (ref 135–145)

## 2023-04-13 MED ORDER — ESCITALOPRAM OXALATE 10 MG PO TABS: 20.0000 mg | ORAL_TABLET | Freq: Every day | ORAL | Status: DC

## 2023-04-13 MED ORDER — LORATADINE 10 MG PO TABS
10.0000 mg | ORAL_TABLET | Freq: Every day | ORAL | Status: DC
  Administered 2023-04-14: 10 mg via ORAL
  Filled 2023-04-13: qty 1

## 2023-04-13 NOTE — Progress Notes (Signed)
Physical Therapy Treatment Patient Details Name: Beverly Wheeler MRN: [id number] DOB: [birth year] Today's Date: 04/13/2023   History of Present Illness 77 y.o. female with hx of COPD, chronic hypoxic respiratory failure on 2 L O2, coronary artery disease, MI, heart failure with reduced ejection fraction, CKD 3, hypertension, diabetes, IDA, who presents with few days of progressive fatigue, new shortness of breath and cough    PT Comments  Pt progressing toward meeting goals to go home.  Emphasis on transition to EO chair, sit to stand from multiple surfaces, safe use of the rollator and progression of gait stability/stamina.     If plan is discharge home, recommend the following: A little help with walking and/or transfers;A little help with bathing/dressing/bathroom;Assistance with cooking/housework;Assist for transportation;Help with stairs or ramp for entrance   Can travel by private vehicle        Equipment Recommendations  None recommended by PT    Recommendations for Other Services       Precautions / Restrictions Precautions Precautions: Fall Precaution Comments: Watch O2     Mobility  Bed Mobility               General bed mobility comments: OOB on arrival    Transfers Overall transfer level: Needs assistance Equipment used: Rollator (4 wheels) Transfers: Sit to/from Stand Sit to Stand: Contact guard assist           General transfer comment: cues to set brakes, but CGA only for sit to stand    Ambulation/Gait Ambulation/Gait assistance: Contact guard assist Gait Distance (Feet): 120 Feet (then 160 feet after sitting rest break in the rollator.  cues to set brakes.) Assistive device: Rollator (4 wheels) Gait Pattern/deviations: Step-through pattern   Gait velocity interpretation: <1.8 ft/sec, indicate of risk for recurrent falls   General Gait Details: pt generally steady with the rollator, keeping it under control.  Parked chair against the  wall with intention of sitting, but didn't lock the braks.  Continued another 160 feet back to the room.  Notable fatigue, but stay in control though tired.   Stairs             Wheelchair Mobility     Tilt Bed    Modified Rankin (Stroke Patients Only)       Balance Overall balance assessment: Needs assistance Sitting-balance support: Feet supported Sitting balance-Leahy Scale: Good     Standing balance support: Reliant on assistive device for balance Standing balance-Leahy Scale: Poor Standing balance comment: pt fatigues quickly                            Cognition Arousal: Alert Behavior During Therapy: WFL for tasks assessed/performed Overall Cognitive Status: Within Functional Limits for tasks assessed                                          Exercises      General Comments General comments (skin integrity, edema, etc.): on 2-3 L [place] SpO2 maintatined mid 90's to upper 90's HR in the upper 80's,      Pertinent Vitals/Pain Pain Assessment Pain Assessment: No/denies pain    Home Living                          Prior Function  PT Goals (current goals can now be found in the care plan section) Acute Rehab PT Goals Patient Stated Goal: breath better PT Goal Formulation: With patient Time For Goal Achievement: 04/26/23 Potential to Achieve Goals: Good Progress towards PT goals: Progressing toward goals    Frequency    Min 1X/week      PT Plan      Co-evaluation              AM-PAC PT "6 Clicks" Mobility   Outcome Measure  Help needed turning from your back to your side while in a flat bed without using bedrails?: A Little Help needed moving from lying on your back to sitting on the side of a flat bed without using bedrails?: A Little Help needed moving to and from a bed to a chair (including a wheelchair)?: A Little Help needed standing up from a chair using your arms (e.g.,  wheelchair or bedside chair)?: A Little Help needed to walk in hospital room?: A Little Help needed climbing 3-5 steps with a railing? : A Lot 6 Click Score: 17    End of Session   Activity Tolerance: Patient limited by fatigue Patient left: in chair;with call bell/phone within reach;with chair alarm set Nurse Communication: Mobility status PT Visit Diagnosis: Unsteadiness on feet (R26.81);Muscle weakness (generalized) (M62.81);Difficulty in walking, not elsewhere classified (R26.2)     Time: 1610-9604 PT Time Calculation (min) (ACUTE ONLY): 22 min  Charges:    $Gait Training: 8-22 mins PT General Charges $$ ACUTE PT VISIT: 1 Visit                     04/13/2023  Jacinto Halim., PT Acute Rehabilitation Services [phone]  (office)   Beverly Wheeler 04/13/2023, 5:06 PM

## 2023-04-13 NOTE — NC FL2 (Signed)
[place] MEDICAID FL2 LEVEL OF CARE FORM     IDENTIFICATION  Patient Name: Beverly Wheeler Birthdate: [birth year] Sex: female Admission Date (Current Location): 04/08/2023  [facility] and [place] Number:  Producer, television/film/video and Address:  [facility], [address] [place], [place] [address]      Provider Number: [id number]  Attending Physician Name and Address:  Alba Cory, MD  Relative Name and Phone Number:       Current Level of Care: Hospital Recommended Level of Care: Skilled Nursing Facility Prior Approval Number:    Date Approved/Denied:   PASRR Number:    Discharge Plan: SNF    Current Diagnoses: Patient Active Problem List   Diagnosis Date Noted   Ischemic cardiomyopathy 04/12/2023   COPD exacerbation (HCC) 04/08/2023   Pyuria 01/14/2022   Chronic respiratory failure with hypoxia and hypercapnia (HCC) 01/14/2022   Hyperammonemia (HCC) 01/14/2022   Diabetes mellitus (HCC) 05/19/2020   Venous stasis dermatitis of both lower extremities 01/08/2018   Iron deficiency anemia 01/08/2018   Acute respiratory failure with hypoxia (HCC)    Hypokalemia    Pressure injury of skin 11/24/2017   Acute kidney injury (HCC)    Cellulitis of left lower extremity    Sepsis (HCC)    Septic shock (HCC)    Hypovolemic shock (HCC)    Hypotension 11/23/2017   CKD (chronic kidney disease), stage III 11/22/2017   CAD, multiple vessel 10/30/2013   Pre-op evaluation 10/17/2013   Myocardial infarction (HCC) 10/16/2013   Acute MI (HCC) 10/16/2013   OVERWEIGHT 12/10/2009   CIGARETTE SMOKER, has stopped smoking 12/10/2009   Essential hypertension 12/10/2009   DEGENERATIVE JOINT DISEASE 12/10/2009   HOARSENESS, CHRONIC 12/10/2009   CHEST PAIN UNSPECIFIED 12/10/2009   Hyperlipidemia, mixed 01/24/2008   Anxiety disorder, unspecified 01/24/2008   Allergic rhinitis 01/24/2008   COPD  GOLD II based on fev1/VC of 58%  01/24/2008   GERD 01/24/2008    POSITIVE PPD 01/24/2008    Orientation RESPIRATION BLADDER Height & Weight     Self, Time, Situation, Place  O2 ([place] 2.5) Incontinent Weight: 191 lb 9.3 oz (86.9 kg) Height:  5\' 1"  (154.9 cm)  BEHAVIORAL SYMPTOMS/MOOD NEUROLOGICAL BOWEL NUTRITION STATUS      Continent Diet (See DC summary)  AMBULATORY STATUS COMMUNICATION OF NEEDS Skin   Limited Assist Verbally Normal                       Personal Care Assistance Level of Assistance  Bathing, Feeding, Dressing Bathing Assistance: Limited assistance Feeding assistance: Independent Dressing Assistance: Limited assistance     Functional Limitations Info  Sight, Hearing, Speech Sight Info: Impaired Hearing Info: Adequate Speech Info: Adequate    SPECIAL CARE FACTORS FREQUENCY  PT (By licensed PT), OT (By licensed OT)     PT Frequency: 5x week OT Frequency: 5x week            Contractures Contractures Info: Not present    Additional Factors Info  Code Status, Allergies, Psychotropic, Insulin Sliding Scale Code Status Info: Full Allergies Info: NKA Psychotropic Info: Escitalopram Insulin Sliding Scale Info: See DC summary       Current Medications (04/13/2023):  This is the current hospital active medication list Current Facility-Administered Medications  Medication Dose Route Frequency Provider Last Rate Last Admin   acetaminophen (TYLENOL) tablet 1,000 mg  1,000 mg Oral Q6H PRN Dolly Rias, MD   1,000 mg at 04/12/23 2142  albuterol (PROVENTIL) (2.5 MG/3ML) 0.083% nebulizer solution 2.5 mg  2.5 mg Nebulization Q6H PRN Briant Cedar, MD       alum & mag hydroxide-simeth (MAALOX/MYLANTA) 200-200-20 MG/5ML suspension 30 mL  30 mL Oral Q4H PRN Hillary Bow, DO   30 mL at 04/13/23 0659   arformoterol (BROVANA) nebulizer solution 15 mcg  15 mcg Nebulization BID Briant Cedar, MD   15 mcg at 04/13/23 1610   aspirin EC tablet 81 mg  81 mg Oral Daily Dolly Rias, MD   81 mg at 04/13/23  9604   atorvastatin (LIPITOR) tablet 80 mg  80 mg Oral QHS Dolly Rias, MD   80 mg at 04/12/23 2131   budesonide (PULMICORT) nebulizer solution 0.25 mg  0.25 mg Nebulization BID Briant Cedar, MD   0.25 mg at 04/13/23 5409   cyanocobalamin (VITAMIN B12) tablet 1,000 mcg  1,000 mcg Oral Daily Dolly Rias, MD   1,000 mcg at 04/13/23 0922   enoxaparin (LOVENOX) injection 40 mg  40 mg Subcutaneous Q24H Mosetta Anis, RPH   40 mg at 04/13/23 8119   escitalopram (LEXAPRO) tablet 20 mg  20 mg Oral Daily Dolly Rias, MD   20 mg at 04/13/23 1478   ezetimibe (ZETIA) tablet 10 mg  10 mg Oral Daily Dolly Rias, MD   10 mg at 04/13/23 0923   ferrous gluconate (FERGON) tablet 324 mg  324 mg Oral BID WC Dolly Rias, MD   324 mg at 04/13/23 0617   guaiFENesin (MUCINEX) 12 hr tablet 600 mg  600 mg Oral BID Regalado, Belkys A, MD   600 mg at 04/13/23 0923   insulin aspart (novoLOG) injection 0-6 Units  0-6 Units Subcutaneous TID WC Dolly Rias, MD   2 Units at 04/12/23 1630   loratadine (CLARITIN) tablet 10 mg  10 mg Oral Daily Briant Cedar, MD   10 mg at 04/13/23 0918   losartan (COZAAR) tablet 12.5 mg  12.5 mg Oral Daily Jodelle Red, MD   12.5 mg at 04/13/23 0918   montelukast (SINGULAIR) tablet 10 mg  10 mg Oral QHS Dolly Rias, MD   10 mg at 04/12/23 2131   nebivolol (BYSTOLIC) tablet 5 mg  5 mg Oral Daily Dolly Rias, MD   5 mg at 04/13/23 0922   pantoprazole (PROTONIX) EC tablet 40 mg  40 mg Oral Daily Briant Cedar, MD   40 mg at 04/13/23 2956   sodium chloride flush (NS) 0.9 % injection 3 mL  3 mL Intravenous Q12H Dolly Rias, MD   3 mL at 04/13/23 0921   triamcinolone (NASACORT) nasal inhaler 2 spray  2 spray Nasal Daily Briant Cedar, MD   2 spray at 04/13/23 2130     Discharge Medications: Please see discharge summary for a list of discharge medications.  Relevant Imaging Results:  Relevant Lab  Results:   Additional Information SS# [id number]  Carley Hammed, LCSW

## 2023-04-13 NOTE — TOC Progression Note (Signed)
Transition of Care [facility] - Progression Note    Patient Details  Name: Beverly Wheeler MRN: [id number] Date of Birth: April 29, 1946  [facility]) CM/SW Contact  Carley Hammed, LCSW Phone Number: 04/13/2023, 11:28 AM  Clinical Narrative:     CSW was notified by medical team that pt was now considering SNF after DC. CSW met with pt at bedside and explained recommendation. Pt notes understanding and is agreeable. She notes she has been to a facility before but cannot remember the name, it was close by. She is agreeable to a fax out for options. She declines CSW speaking with her son, she will update him when he gets there to visit. CSW to complete workup, TOC will continue to follow.   Expected Discharge Plan: Home w Home Health Services Barriers to Discharge: Continued Medical Work up  Expected Discharge Plan and Services   Discharge Planning Services: CM Consult Post Acute Care Choice: Home Health Living arrangements for the past 2 months: Single Family Home                 DME Arranged: N/A         HH Arranged: RN, PT, Social Work (see note)           Social Determinants of Health (SDOH) Interventions SDOH Screenings   Food Insecurity: No Food Insecurity (04/09/2023)  Housing: Patient Declined (04/12/2023)  Transportation Needs: No Transportation Needs (04/12/2023)  Utilities: At Risk (04/09/2023)  Alcohol Screen: Low Risk  (04/12/2023)  Depression (PHQ2-9): Low Risk  (05/02/2022)  Financial Resource Strain: Low Risk  (04/12/2023)  Physical Activity: Inactive (05/02/2022)  Social Connections: Socially Isolated (04/29/2021)  Stress: No Stress Concern Present (05/02/2022)  Tobacco Use: Medium Risk (04/08/2023)    Readmission Risk Interventions     No data to display

## 2023-04-13 NOTE — Progress Notes (Signed)
   Heart Failure Stewardship Pharmacist Progress Note   PCP: [place], Betty G, MD PCP-Cardiologist: Thurmon Fair, MD    HPI:  77 yo F with PMH of CAD, CHF, HTN, HLD, diabetes, CKD III, and COPD.  In 2015, admitted for STEMI. Underwent cath that showed significant 3 vessel disease. EF 30-35%. She declined CABG. ECHO 11/2017 showed EF 35-40%.   Admitted on 9/14 with weakness and shortness of breath. CXR with pulmonary vascular congestion. BNP elevated. ECHO on 9/15 showed LVEF reduced further to 25-30%, global hypokinesis, G2DD, RV normal, mild to moderate MR, mild AS. She is being treated for COPD and CHF exacerbation.   Current HF Medications: ACE/ARB/ARNI: losartan 12.5 mg daily  Prior to admission HF Medications: Diuretic: furosemide 40 mg daily  Pertinent Lab Values: Serum creatinine 1.42, BUN 26, Potassium 4.5, Sodium 136, BNP 626.5, Magnesium 1.9  Vital Signs: Weight: 191 lbs (admission weight: 189 lbs) Blood pressure: 130/50s  Heart rate: 60s  I/O: net -1.4L since admission  Medication Assistance / Insurance Benefits Check: Does the patient have prescription insurance?  Yes Type of insurance plan: UHC Medicare + [employer]  Outpatient Pharmacy:  Prior to admission [facility] Is the patient willing to use [facility] TOC pharmacy at discharge? Yes Is the patient willing to transition their outpatient pharmacy to utilize a [facility] outpatient pharmacy?   Yes - transitioning to [facility] mail order    Assessment: 1. Acute on chronic systolic CHF (LVEF 25-30%), due to ICM. NYHA class II symptoms. - Held IV lasix with AKI. Creatinine improved and is relatively stable. Strict I/Os and daily weights. Keep K>4 and Mg>2. - BB limited with lung disease, on nebivolol - Continue losartan 12.5 mg daily - Consider starting spironolactone 12.5 mg daily  - Poor candidate for SGLT2i - GDMT limited in the setting of chronic respiratory failure, renal disease, and poor hygiene.     Plan: 1) Medication changes recommended at this time: - Start spironolactone 12.5 mg daily   2) Patient assistance: - None pending, has Medicare + [employer] - States that sometimes it is difficult to pay for 90 day prescriptions and relies on her son to pick up her medications from the pharmacy - Agreeable to switch to [facility] mail order pharmacy. Address and phone number confirmed.  - [facility] the more [facility] prescriptions to her home.  3)  Education  - Patient has been educated on current HF medications and potential additions to HF medication regimen - Patient verbalizes understanding that over the next few months, these medication doses may change and more medications may be added to optimize HF regimen - Patient has been educated on basic disease state pathophysiology and goals of therapy   Sharen Hones, PharmD, BCPS Heart Failure Stewardship Pharmacist Phone [phone]

## 2023-04-13 NOTE — Progress Notes (Signed)
Rounding Note    Patient Name: Beverly Wheeler Date of Encounter: 04/13/2023  [facility] Cardiologist: Thurmon Fair, MD   Subjective   Eyes feel dry, has some congestion that she is concerned may be allergies. Breathing stable. No chest pain.  Inpatient Medications    Scheduled Meds:  arformoterol  15 mcg Nebulization BID   aspirin EC  81 mg Oral Daily   atorvastatin  80 mg Oral QHS   budesonide (PULMICORT) nebulizer solution  0.25 mg Nebulization BID   cyanocobalamin  1,000 mcg Oral Daily   enoxaparin (LOVENOX) injection  40 mg Subcutaneous Q24H   escitalopram  20 mg Oral Daily   ezetimibe  10 mg Oral Daily   ferrous gluconate  324 mg Oral BID WC   guaiFENesin  600 mg Oral BID   insulin aspart  0-6 Units Subcutaneous TID WC   loratadine  10 mg Oral Daily   losartan  12.5 mg Oral Daily   montelukast  10 mg Oral QHS   nebivolol  5 mg Oral Daily   pantoprazole  40 mg Oral Daily   sodium chloride flush  3 mL Intravenous Q12H   triamcinolone  2 spray Nasal Daily   Continuous Infusions:   PRN Meds: acetaminophen, albuterol, alum & mag hydroxide-simeth   Vital Signs    Vitals:   04/13/23 0324 04/13/23 0745 04/13/23 0819 04/13/23 0822  BP: (!) 143/58 135/86    Pulse: 60 64    Resp: 16 18    Temp: 98.1 F (36.7 C) 98.2 F (36.8 C)    TempSrc: Oral Oral    SpO2: 93% 96% 97% 97%  Weight: 86.9 kg     Height:        Intake/Output Summary (Last 24 hours) at 04/13/2023 0916 Last data filed at 04/13/2023 0800 Gross per 24 hour  Intake 840 ml  Output 650 ml  Net 190 ml      04/13/2023    3:24 AM 04/12/2023    4:11 AM 04/11/2023    4:50 AM  Last 3 Weights  Weight (lbs) 191 lb 9.3 oz 190 lb 4.1 oz 195 lb 5.2 oz  Weight (kg) 86.9 kg 86.3 kg 88.6 kg      Telemetry    SR - Personally Reviewed  Physical Exam   GEN: No acute distress.  On O2 by nasal cannula Neck:  unable to assess 2/2 body habitus Cardiac: RRR, no murmurs, rubs, or gallops.   Respiratory: distant breath sounds bilaterally GI: Soft, nontender, non-distended  MS: No edema; No deformity. Neuro:  Nonfocal  Psych: Normal affect   New pertinent results (labs, ECG, imaging, cardiac studies)    echo w/EF now 25-30%, was 35-40% in 2019.   Patient Profile     77 y.o. female with complex PMH, including CAD recommended for CABG in 2015, chronic systolic and diastolic heart failure, chronic hypoxic respiratory failure on home O2 whom cardiology is asked to see for worsening EF on echo.   Assessment & Plan    CAD Chronic systolic and diastolic heart failure Ischemic cardiomyopathy -recommended for CABG in 2015, cath has been extensively reviewed previously and not recommended for PCI -she declined CABG at that time. Given her comorbidities, lack of symptoms, and prior preferences to avoid surgery, would manage medically. She is in agreement with this. -no lasix given Cr bump, exam. Limited options for GDMT. On nebivolol, tolerating.  -tolerating low dose losartan, Cr/BP stable -with risk of UTI, largely bedbound,  would avoid SGLT2i at this time -continue aspirin, statin, ezetimibe  AKI on CKD stage 3a -Cr downtrending slightly today to 1.34. Was 1 a year ago and 1.23 on admission   Chronic hypoxemic respiratory failure, on home O2 COPD CAP -per primary team  [facility] will sign off.   Medication Recommendations:  losartan, nebivolol as currently ordered. Would make oral furosemide as needed until repeat BMET/follow up in HF clinic Other recommendations (labs, testing, etc):  BMET 1 week after discharge Follow up as an outpatient:  has appt with HF impact clinic on 04/27/23 and Dr. Royann Shivers on 06/14/23     Signed, Jodelle Red, MD  04/13/2023, 9:16 AM

## 2023-04-13 NOTE — Progress Notes (Signed)
Mobility Specialist Progress Note:   04/13/23 1000  Mobility  Activity Ambulated with assistance to bathroom;Ambulated with assistance in room  Level of Assistance Contact guard assist, steadying assist  Assistive Device Four wheel walker  Distance Ambulated (ft) 30 ft (15+15)  Activity Response Tolerated well  Mobility Referral Yes  $Mobility charge 1 Mobility  Mobility Specialist Start Time (ACUTE ONLY) 1000  Mobility Specialist Stop Time (ACUTE ONLY) 1025  Mobility Specialist Time Calculation (min) (ACUTE ONLY) 25 min    Pre Mobility: 69 HR,  94% SpO2 During Mobility: 78 HR,  90% SpO2 Post Mobility:  72 HR,  91% SpO2  Pt received in chair, requesting to go to BR. Void successful. Assisted w/ peri care.C/o of dizziness after voiding and denied further ambulation. VSS throughout. Pt left in chair with call bell and all needs met.  [place]ante Earlene Plater Mobility Specialist Please contact via Special educational needs teacher or Rehab office at [phone]

## 2023-04-13 NOTE — Progress Notes (Signed)
PROGRESS NOTE    Beverly Wheeler  ZOX:[id number] DOB: [birth year] DOA: 04/08/2023 PCP: [place], Betty G, MD   Brief Narrative: 77 year old with past medical history significant for COPD, chronic hypoxic respiratory failure on 2 L of oxygen, CAD, CHF, CKD stage III, hypertension, hyperlipidemia, IDA who presents with progressive fatigue, worsening shortness of breath and cough.  Former smoker 40 pack years.  She has been using home inhalers and diuretic as prescribed.  Patient admitted with acute on chronic systolic and diastolic heart failure exacerbation, ischemic cardiomyopathy, COPD exacerbation.  She was started on diuretics and IV steroids.  Subsequently transition to oral prednisone.  Cardiology consulted and has been assisting with management of heart failure exacerbation.   Assessment & Plan:   Principal Problem:   COPD exacerbation (HCC) Active Problems:   Ischemic cardiomyopathy  1-Acute on Chronic Systolic and Diastolic heart failure exacerbation Pulmonary hypertension Ischemic cardiomyopathy, refused CABG in the past Patient presented with shortness of breath, elevation BNP 626, chest x-ray with pulmonary vascular congestion. -Echo showed ejection fraction 25 to 30%, global hypokinesis, grade 2 diastolic dysfunction, reduced ejection fraction from previous. -Cardiology recommended CABG in 2015, refused surgery at that time. -She received IV Lasix, held due to increased creatinine -Appreciate cardiology recommendation.  Started on ARB.  --1.4 L. -Weight : 205----190 Waiting Bmet results.   2-COPD exacerbation, acute on chronic hypoxic hypercarbic respiratory failure -Back on 2 L of oxygen He had declined BiPAP Treated initially with IV Solu-Medrol, now on prednisone for total 5 days.  Completed  azithromycin. Continue DuoNeb, Brovana Pulmicort Completed ceftriaxone for 5 days.   Hypomagnesemia: Replaced  CAD, hyperlipidemia Elevation of troponin LDL 129 Continue  aspirin, atorvastatin, Zetia -Was recommended back in 2015, patient declined surgery. Medical management  AKI on CKD 3A: In the setting of diuretics   History of iron deficiency anemia: Continue with iron supplement  Diabetes type 2, hyperglycemia:  SSI.   Mood disorder: Continue with Lexapro  Obesity: Need lifestyle modification      Estimated body mass index is 36.2 kg/m as calculated from the following:   Height as of this encounter: 5\' 1"  (1.549 m).   Weight as of this encounter: 86.9 kg.   DVT prophylaxis: Lovenox Code Status: Full code Family Communication: care discussed with patient.  Disposition Plan:  Status is: Inpatient Remains inpatient appropriate because: needs Rehab.    Consultants:  Cardiology   Procedures:  ECHO  Antimicrobials:    Subjective: She is alert, breathing better. She is weak,. PT is recommending rehab.  Patient has now agreed to rehab.   Objective: Vitals:   04/13/23 0745 04/13/23 0819 04/13/23 0822 04/13/23 1055  BP: 135/86   (!) 120/52  Pulse: 64   65  Resp: 18   20  Temp: 98.2 F (36.8 C)   98.1 F (36.7 C)  TempSrc: Oral   Oral  SpO2: 96% 97% 97% 95%  Weight:      Height:        Intake/Output Summary (Last 24 hours) at 04/13/2023 1146 Last data filed at 04/13/2023 0800 Gross per 24 hour  Intake 840 ml  Output 650 ml  Net 190 ml   Filed Weights   04/11/23 0450 04/12/23 0411 04/13/23 0324  Weight: 88.6 kg 86.3 kg 86.9 kg    Examination:  General exam: NAD Respiratory system: CTA Cardiovascular system: S 1. S 2 RRR Gastrointestinal system: BS present, soft, nt Central nervous system: alert Extremities trace edema    Data Reviewed:  I have personally reviewed following labs and imaging studies  CBC: Recent Labs  Lab 04/08/23 1843 04/08/23 1852 04/08/23 2353 04/10/23 0222 04/11/23 0233 04/12/23 0247  WBC 11.3*  --  11.4* 11.0* 10.3 10.2  NEUTROABS 9.2*  --   --  8.9* 8.1* 7.2  HGB 9.2* 9.5*  9.0* 8.2* 8.5* 8.9*  HCT 29.2* 28.0* 28.5* 24.4* 26.1* 27.5*  MCV 99.0  --  101.1* 96.8 97.8 98.2  PLT 200  --  187 185 184 184   Basic Metabolic Panel: Recent Labs  Lab 04/08/23 1843 04/08/23 1852 04/09/23 0244 04/10/23 0222 04/11/23 0233 04/12/23 0247  NA 140 139 138 134* 137 139  K 4.6 4.6 4.7 3.9 4.4 4.0  CL 90*  --  89* 88* 90* 91*  CO2 38*  --  32 35* 39* 39*  GLUCOSE 133*  --  263* 177* 145* 129*  BUN 17  --  18 26* 25* 26*  CREATININE 1.23*  --  1.24* 1.68* 1.37* 1.34*  CALCIUM 8.9  --  8.8* 8.1* 8.6* 8.7*  MG 1.7  --  1.5* 1.9  --   --   PHOS  --   --  3.1  --   --   --    GFR: Estimated Creatinine Clearance: 36.9 mL/min (A) (by C-G formula based on SCr of 1.34 mg/dL (H)). Liver Function Tests: Recent Labs  Lab 04/08/23 1843  AST 14*  ALT 15  ALKPHOS 72  BILITOT 0.5  PROT 6.3*  ALBUMIN 3.1*   No results for input(s): "LIPASE", "AMYLASE" in the last 168 hours. No results for input(s): "AMMONIA" in the last 168 hours. Coagulation Profile: No results for input(s): "INR", "PROTIME" in the last 168 hours. Cardiac Enzymes: No results for input(s): "CKTOTAL", "CKMB", "CKMBINDEX", "TROPONINI" in the last 168 hours. BNP (last 3 results) No results for input(s): "PROBNP" in the last 8760 hours. HbA1C: No results for input(s): "HGBA1C" in the last 72 hours. CBG: Recent Labs  Lab 04/12/23 1100 04/12/23 1614 04/12/23 2104 04/13/23 0613 04/13/23 1136  GLUCAP 251* 241* 183* 121* 215*   Lipid Profile: No results for input(s): "CHOL", "HDL", "LDLCALC", "TRIG", "CHOLHDL", "LDLDIRECT" in the last 72 hours. Thyroid Function Tests: No results for input(s): "TSH", "T4TOTAL", "FREET4", "T3FREE", "THYROIDAB" in the last 72 hours. Anemia Panel: No results for input(s): "VITAMINB12", "FOLATE", "FERRITIN", "TIBC", "IRON", "RETICCTPCT" in the last 72 hours. Sepsis Labs: Recent Labs  Lab 04/10/23 0222  PROCALCITON <0.10    Recent Results (from the past 240 hour(s))   Resp panel by RT-PCR (RSV, Flu A&B, Covid) Anterior Nasal Swab     Status: None   Collection Time: 04/08/23  6:42 PM   Specimen: Anterior Nasal Swab  Result Value Ref Range Status   SARS Coronavirus 2 by RT PCR NEGATIVE NEGATIVE Final   Influenza A by PCR NEGATIVE NEGATIVE Final   Influenza B by PCR NEGATIVE NEGATIVE Final    Comment: (NOTE) The Xpert Xpress SARS-CoV-2/FLU/RSV plus assay is intended as an aid in the diagnosis of influenza from Nasopharyngeal swab specimens and should not be used as a sole basis for treatment. Nasal washings and aspirates are unacceptable for Xpert Xpress SARS-CoV-2/FLU/RSV testing.  Fact Sheet for Patients: BloggerCourse.com  Fact Sheet for Healthcare Providers: SeriousBroker.it  This test is not yet approved or cleared by the [place] FDA and has been authorized for detection and/or diagnosis of SARS-CoV-2 by FDA under an Emergency Use Authorization (EUA). This EUA will remain in effect (meaning  this test can be used) for the duration of the COVID-19 declaration under Section 564(b)(1) of the Act, 21 U.S.C. section 360bbb-3(b)(1), unless the authorization is terminated or revoked.     Resp Syncytial Virus by PCR NEGATIVE NEGATIVE Final    Comment: (NOTE) Fact Sheet for Patients: BloggerCourse.com  Fact Sheet for Healthcare Providers: SeriousBroker.it  This test is not yet approved or cleared by the [place] FDA and has been authorized for detection and/or diagnosis of SARS-CoV-2 by FDA under an Emergency Use Authorization (EUA). This EUA will remain in effect (meaning this test can be used) for the duration of the COVID-19 declaration under Section 564(b)(1) of the Act, 21 U.S.C. section 360bbb-3(b)(1), unless the authorization is terminated or revoked.  Performed at [facility], [address], [place],  [place] [address]   SARS Coronavirus 2 by RT PCR (hospital order, performed in [facility] hospital lab) *cepheid single result test* Anterior Nasal Swab     Status: None   Collection Time: 04/08/23  6:43 PM   Specimen: Anterior Nasal Swab  Result Value Ref Range Status   SARS Coronavirus 2 by RT PCR NEGATIVE NEGATIVE Final    Comment: Performed at [facility], [address] [place], [place] [address]  MRSA Next Gen by PCR, Nasal     Status: None   Collection Time: 04/09/23 12:24 AM   Specimen: Nasal Mucosa; Nasal Swab  Result Value Ref Range Status   MRSA by PCR Next Gen NOT DETECTED NOT DETECTED Final    Comment: (NOTE) The GeneXpert MRSA Assay (FDA approved for NASAL specimens only), is one component of a comprehensive MRSA colonization surveillance program. It is not intended to diagnose MRSA infection nor to guide or monitor treatment for MRSA infections. Test performance is not FDA approved in patients less than 24 years old. Performed at [facility], [address] [place], [place] [address]          Radiology Studies: No results found.      Scheduled Meds:  arformoterol  15 mcg Nebulization BID   aspirin EC  81 mg Oral Daily   atorvastatin  80 mg Oral QHS   budesonide (PULMICORT) nebulizer solution  0.25 mg Nebulization BID   cyanocobalamin  1,000 mcg Oral Daily   enoxaparin (LOVENOX) injection  40 mg Subcutaneous Q24H   escitalopram  20 mg Oral Daily   ezetimibe  10 mg Oral Daily   ferrous gluconate  324 mg Oral BID WC   guaiFENesin  600 mg Oral BID   insulin aspart  0-6 Units Subcutaneous TID WC   loratadine  10 mg Oral Daily   losartan  12.5 mg Oral Daily   montelukast  10 mg Oral QHS   nebivolol  5 mg Oral Daily   pantoprazole  40 mg Oral Daily   sodium chloride flush  3 mL Intravenous Q12H   triamcinolone  2 spray Nasal Daily   Continuous Infusions:     LOS: 5 days    Time spent: 35 minutes.     Alba Cory, MD [facility]   If 7PM-7AM, please contact night-coverage [email or web address]  04/13/2023, 11:46 AM

## 2023-04-13 NOTE — Progress Notes (Signed)
Occupational Therapy Treatment Patient Details Name: Beverly Wheeler MRN: [id number] DOB: 12-Aug-1945 Today's Date: 04/13/2023   History of present illness 77 y.o. female with hx of COPD, chronic hypoxic respiratory failure on 2 L O2, coronary artery disease, MI, heart failure with reduced ejection fraction, CKD 3, hypertension, diabetes, IDA, who presents with few days of progressive fatigue, new shortness of breath and cough   OT comments  Patient with good progress toward patient focused goals.  Patient needing less assist for transfers, CGA and improved lower body ADL, Min A from a sit to stand level.  Better balance noted with transfers at [facility] level.  OT to continue efforts in the acute setting, with [facility] OT.        If plan is discharge home, recommend the following:  Assist for transportation;Assistance with cooking/housework;A lot of help with bathing/dressing/bathroom;A lot of help with walking and/or transfers   Equipment Recommendations  None recommended by OT    Recommendations for Other Services      Precautions / Restrictions Precautions Precautions: Fall Precaution Comments: Watch O2 Restrictions Weight Bearing Restrictions: No       Mobility Bed Mobility Overal bed mobility: Needs Assistance Bed Mobility: Supine to Sit     Supine to sit: Min assist       Patient Response: Cooperative  Transfers Overall transfer level: Needs assistance Equipment used: Rollator (4 wheels) Transfers: Sit to/from Stand, Bed to chair/wheelchair/BSC Sit to Stand: Contact guard assist     Step pivot transfers: Contact guard assist           Balance Overall balance assessment: Needs assistance Sitting-balance support: Feet supported Sitting balance-Leahy Scale: Good     Standing balance support: Reliant on assistive device for balance Standing balance-Leahy Scale: Poor                             ADL either performed or assessed with clinical  judgement   ADL       Grooming: Wash/dry hands;Wash/dry face;Sitting;Set up           Upper Body Dressing : Set up;Sitting   Lower Body Dressing: Minimal assistance;Sit to/from stand;Moderate assistance   Toilet Transfer: BSC/3in1;Stand-pivot;Contact guard assist;Rollator (4 wheels)                  Extremity/Trunk Assessment Upper Extremity Assessment Upper Extremity Assessment: Overall WFL for tasks assessed   Lower Extremity Assessment Lower Extremity Assessment: Defer to PT evaluation   Cervical / Trunk Assessment Cervical / Trunk Assessment: Kyphotic    Vision Patient Visual Report: No change from baseline     Perception Perception Perception: Not tested   Praxis Praxis Praxis: Not tested    Cognition Arousal: Alert Behavior During Therapy: WFL for tasks assessed/performed Overall Cognitive Status: Within Functional Limits for tasks assessed                                                             Pertinent Vitals/ Pain       Pain Assessment Pain Assessment: No/denies pain  Frequency  Min 1X/week        Progress Toward Goals  OT Goals(current goals can now be found in the care plan section)  Progress towards OT goals: Progressing toward goals  Acute Rehab OT Goals OT Goal Formulation: With patient Time For Goal Achievement: 04/25/23 Potential to Achieve Goals: Good  Plan      Co-evaluation                 AM-PAC OT "6 Clicks" Daily Activity     Outcome Measure   Help from another person eating meals?: None Help from another person taking care of personal grooming?: A Little Help from another person toileting, which includes using toliet, bedpan, or urinal?: A Little Help from another person bathing (including washing, rinsing, drying)?: A Little Help from another person to put on and taking off regular upper body  clothing?: None Help from another person to put on and taking off regular lower body clothing?: A Little 6 Click Score: 20    End of Session Equipment Utilized During Treatment: Rollator (4 wheels)  OT Visit Diagnosis: Unsteadiness on feet (R26.81);Muscle weakness (generalized) (M62.81)   Activity Tolerance Patient tolerated treatment well   Patient Left in chair;with call bell/phone within reach;with chair alarm set   Nurse Communication Mobility status        Time: [phone] OT Time Calculation (min): 15 min  Charges: OT General Charges $OT Visit: 1 Visit OT Treatments $Self Care/Home Management : 8-22 mins  04/13/2023  RP, OTR/L  Acute Rehabilitation Services  Office:  [phone]   Suzanna Obey 04/13/2023, 9:13 AM

## 2023-04-13 NOTE — Plan of Care (Signed)
  Problem: Education: Goal: Ability to demonstrate management of disease process will improve Outcome: Progressing Goal: Ability to verbalize understanding of medication therapies will improve Outcome: Progressing Goal: Individualized Educational Video(s) Outcome: Progressing   Problem: Activity: Goal: Capacity to carry out activities will improve Outcome: Progressing   Problem: Cardiac: Goal: Ability to achieve and maintain adequate cardiopulmonary perfusion will improve Outcome: Progressing   Problem: Education: Goal: Knowledge of disease or condition will improve Outcome: Progressing Goal: Knowledge of the prescribed therapeutic regimen will improve Outcome: Progressing Goal: Individualized Educational Video(s) Outcome: Progressing   Problem: Activity: Goal: Ability to tolerate increased activity will improve Outcome: Progressing Goal: Will verbalize the importance of balancing activity with adequate rest periods Outcome: Progressing   Problem: Respiratory: Goal: Ability to maintain a clear airway will improve Outcome: Progressing Goal: Levels of oxygenation will improve Outcome: Progressing Goal: Ability to maintain adequate ventilation will improve Outcome: Progressing   Problem: Education: Goal: Ability to describe self-care measures that may prevent or decrease complications (Diabetes Survival Skills Education) will improve Outcome: Progressing Goal: Individualized Educational Video(s) Outcome: Progressing   Problem: Coping: Goal: Ability to adjust to condition or change in health will improve Outcome: Progressing   Problem: Fluid Volume: Goal: Ability to maintain a balanced intake and output will improve Outcome: Progressing   Problem: Health Behavior/Discharge Planning: Goal: Ability to identify and utilize available resources and services will improve Outcome: Progressing Goal: Ability to manage health-related needs will improve Outcome: Progressing    Problem: Metabolic: Goal: Ability to maintain appropriate glucose levels will improve Outcome: Progressing   Problem: Nutritional: Goal: Maintenance of adequate nutrition will improve Outcome: Progressing Goal: Progress toward achieving an optimal weight will improve Outcome: Progressing   Problem: Skin Integrity: Goal: Risk for impaired skin integrity will decrease Outcome: Progressing   Problem: Tissue Perfusion: Goal: Adequacy of tissue perfusion will improve Outcome: Progressing   Problem: Education: Goal: Knowledge of General Education information will improve Description: Including pain rating scale, medication(s)/side effects and non-pharmacologic comfort measures Outcome: Progressing   Problem: Health Behavior/Discharge Planning: Goal: Ability to manage health-related needs will improve Outcome: Progressing   Problem: Clinical Measurements: Goal: Ability to maintain clinical measurements within normal limits will improve Outcome: Progressing Goal: Will remain free from infection Outcome: Progressing Goal: Diagnostic test results will improve Outcome: Progressing Goal: Respiratory complications will improve Outcome: Progressing Goal: Cardiovascular complication will be avoided Outcome: Progressing   Problem: Activity: Goal: Risk for activity intolerance will decrease Outcome: Progressing   Problem: Nutrition: Goal: Adequate nutrition will be maintained Outcome: Progressing   Problem: Coping: Goal: Level of anxiety will decrease Outcome: Progressing   Problem: Elimination: Goal: Will not experience complications related to bowel motility Outcome: Progressing Goal: Will not experience complications related to urinary retention Outcome: Progressing   Problem: Pain Managment: Goal: General experience of comfort will improve Outcome: Progressing   Problem: Safety: Goal: Ability to remain free from injury will improve Outcome: Progressing   Problem:  Skin Integrity: Goal: Risk for impaired skin integrity will decrease Outcome: Progressing

## 2023-04-13 NOTE — Plan of Care (Signed)
  Problem: Education: Goal: Ability to demonstrate management of disease process will improve Outcome: Progressing Goal: Ability to verbalize understanding of medication therapies will improve Outcome: Progressing Goal: Individualized Educational Video(s) Outcome: Progressing   Problem: Activity: Goal: Capacity to carry out activities will improve Outcome: Progressing   Problem: Cardiac: Goal: Ability to achieve and maintain adequate cardiopulmonary perfusion will improve Outcome: Progressing   Problem: Education: Goal: Knowledge of disease or condition will improve Outcome: Progressing Goal: Knowledge of the prescribed therapeutic regimen will improve Outcome: Progressing Goal: Individualized Educational Video(s) Outcome: Progressing   Problem: Activity: Goal: Ability to tolerate increased activity will improve Outcome: Progressing Goal: Will verbalize the importance of balancing activity with adequate rest periods Outcome: Progressing   Problem: Respiratory: Goal: Ability to maintain a clear airway will improve Outcome: Progressing Goal: Levels of oxygenation will improve Outcome: Progressing Goal: Ability to maintain adequate ventilation will improve Outcome: Progressing   Problem: Education: Goal: Ability to describe self-care measures that may prevent or decrease complications (Diabetes Survival Skills Education) will improve Outcome: Progressing Goal: Individualized Educational Video(s) Outcome: Progressing   Problem: Coping: Goal: Ability to adjust to condition or change in health will improve Outcome: Progressing   Problem: Fluid Volume: Goal: Ability to maintain a balanced intake and output will improve Outcome: Progressing   Problem: Health Behavior/Discharge Planning: Goal: Ability to identify and utilize available resources and services will improve Outcome: Progressing Goal: Ability to manage health-related needs will improve Outcome: Progressing    Problem: Metabolic: Goal: Ability to maintain appropriate glucose levels will improve Outcome: Progressing   Problem: Nutritional: Goal: Maintenance of adequate nutrition will improve Outcome: Progressing Goal: Progress toward achieving an optimal weight will improve Outcome: Progressing   Problem: Skin Integrity: Goal: Risk for impaired skin integrity will decrease Outcome: Progressing   Problem: Tissue Perfusion: Goal: Adequacy of tissue perfusion will improve Outcome: Progressing   Problem: Health Behavior/Discharge Planning: Goal: Ability to manage health-related needs will improve Outcome: Progressing   Problem: Clinical Measurements: Goal: Ability to maintain clinical measurements within normal limits will improve Outcome: Progressing Goal: Will remain free from infection Outcome: Progressing Goal: Diagnostic test results will improve Outcome: Progressing Goal: Respiratory complications will improve Outcome: Progressing Goal: Cardiovascular complication will be avoided Outcome: Progressing

## 2023-04-14 ENCOUNTER — Other Ambulatory Visit (HOSPITAL_COMMUNITY): Payer: Self-pay

## 2023-04-14 DIAGNOSIS — J441 Chronic obstructive pulmonary disease with (acute) exacerbation: Secondary | ICD-10-CM | POA: Diagnosis not present

## 2023-04-14 LAB — BASIC METABOLIC PANEL
Anion gap: 10 (ref 5–15)
BUN: 26 mg/dL — ABNORMAL HIGH (ref 8–23)
CO2: 36 mmol/L — ABNORMAL HIGH (ref 22–32)
Calcium: 8.7 mg/dL — ABNORMAL LOW (ref 8.9–10.3)
Chloride: 91 mmol/L — ABNORMAL LOW (ref 98–111)
Creatinine, Ser: 1.35 mg/dL — ABNORMAL HIGH (ref 0.44–1.00)
GFR, Estimated: 41 mL/min — ABNORMAL LOW (ref >60)
Glucose, Bld: 156 mg/dL — ABNORMAL HIGH (ref 70–99)
Potassium: 3.8 mmol/L (ref 3.5–5.1)
Sodium: 137 mmol/L (ref 135–145)

## 2023-04-14 LAB — GLUCOSE, CAPILLARY
Glucose-Capillary: 117 mg/dL — ABNORMAL HIGH (ref 70–99)
Glucose-Capillary: 148 mg/dL — ABNORMAL HIGH (ref 70–99)

## 2023-04-14 MED ORDER — FUROSEMIDE 40 MG PO TABS
40.0000 mg | ORAL_TABLET | Freq: Every day | ORAL | 1 refills | Status: DC | PRN
  Filled 2023-04-14: qty 90, 90d supply, fill #0

## 2023-04-14 MED ORDER — LOSARTAN POTASSIUM 25 MG PO TABS
12.5000 mg | ORAL_TABLET | Freq: Every day | ORAL | 0 refills | Status: DC
  Filled 2023-04-14: qty 30, 60d supply, fill #0

## 2023-04-14 MED ORDER — DULERA 200-5 MCG/ACT IN AERO
2.0000 | INHALATION_SPRAY | Freq: Every day | RESPIRATORY_TRACT | 2 refills | Status: DC
  Filled 2023-04-14: qty 13, 30d supply, fill #0

## 2023-04-14 MED ORDER — PANTOPRAZOLE SODIUM 40 MG PO TBEC
40.0000 mg | DELAYED_RELEASE_TABLET | Freq: Every day | ORAL | 0 refills | Status: DC
  Filled 2023-04-14: qty 30, 30d supply, fill #0

## 2023-04-14 MED ORDER — GUAIFENESIN ER 600 MG PO TB12
600.0000 mg | ORAL_TABLET | Freq: Two times a day (BID) | ORAL | 0 refills | Status: DC
  Filled 2023-04-14: qty 14, 7d supply, fill #0

## 2023-04-14 NOTE — TOC Progression Note (Signed)
Transition of Care (TOC) - Progression Note   SW and NCM spoke to patient at bedside. Discussed plan . Patient has decided to go home at discharge with home health with [facility]. Patient called son he will come pick patient up and bring her portable oxygen tank .   Kandee Keen with [facility] aware discharge is today  Patient Details  Name: Beverly Wheeler MRN: [id number] Date of Birth: [birth year]  Transition of Care [facility] CM/SW Contact  Aviyah Swetz, Adria Devon, RN Phone Number: 04/14/2023, 12:11 PM  Clinical Narrative:       Expected Discharge Plan: Home w Home Health Services Barriers to Discharge: Continued Medical Work up  Expected Discharge Plan and Services   Discharge Planning Services: CM Consult Post Acute Care Choice: Home Health Living arrangements for the past 2 months: Single Family Home Expected Discharge Date: 04/14/23               DME Arranged: N/A         HH Arranged: RN, PT, Social Work (see note)           Social Determinants of Health (SDOH) Interventions SDOH Screenings   Food Insecurity: No Food Insecurity (04/09/2023)  Housing: Patient Declined (04/12/2023)  Transportation Needs: No Transportation Needs (04/12/2023)  Utilities: At Risk (04/09/2023)  Alcohol Screen: Low Risk  (04/12/2023)  Depression (PHQ2-9): Low Risk  (05/02/2022)  Financial Resource Strain: Low Risk  (04/12/2023)  Physical Activity: Inactive (05/02/2022)  Social Connections: Socially Isolated (04/29/2021)  Stress: No Stress Concern Present (05/02/2022)  Tobacco Use: Medium Risk (04/08/2023)    Readmission Risk Interventions     No data to display

## 2023-04-14 NOTE — Plan of Care (Signed)
  Problem: Education: Goal: Ability to demonstrate management of disease process will improve Outcome: Progressing Goal: Ability to verbalize understanding of medication therapies will improve Outcome: Progressing Goal: Individualized Educational Video(s) Outcome: Progressing   Problem: Activity: Goal: Capacity to carry out activities will improve Outcome: Progressing   Problem: Cardiac: Goal: Ability to achieve and maintain adequate cardiopulmonary perfusion will improve Outcome: Progressing   Problem: Education: Goal: Knowledge of disease or condition will improve Outcome: Progressing Goal: Knowledge of the prescribed therapeutic regimen will improve Outcome: Progressing Goal: Individualized Educational Video(s) Outcome: Progressing   Problem: Activity: Goal: Ability to tolerate increased activity will improve Outcome: Progressing Goal: Will verbalize the importance of balancing activity with adequate rest periods Outcome: Progressing   Problem: Respiratory: Goal: Ability to maintain a clear airway will improve Outcome: Progressing Goal: Levels of oxygenation will improve Outcome: Progressing Goal: Ability to maintain adequate ventilation will improve Outcome: Progressing   Problem: Education: Goal: Ability to describe self-care measures that may prevent or decrease complications (Diabetes Survival Skills Education) will improve Outcome: Progressing Goal: Individualized Educational Video(s) Outcome: Progressing   Problem: Coping: Goal: Ability to adjust to condition or change in health will improve Outcome: Progressing   Problem: Fluid Volume: Goal: Ability to maintain a balanced intake and output will improve Outcome: Progressing   Problem: Health Behavior/Discharge Planning: Goal: Ability to identify and utilize available resources and services will improve Outcome: Progressing Goal: Ability to manage health-related needs will improve Outcome: Progressing    Problem: Metabolic: Goal: Ability to maintain appropriate glucose levels will improve Outcome: Progressing   Problem: Nutritional: Goal: Maintenance of adequate nutrition will improve Outcome: Progressing Goal: Progress toward achieving an optimal weight will improve Outcome: Progressing   Problem: Skin Integrity: Goal: Risk for impaired skin integrity will decrease Outcome: Progressing   Problem: Tissue Perfusion: Goal: Adequacy of tissue perfusion will improve Outcome: Progressing   Problem: Education: Goal: Knowledge of General Education information will improve Description: Including pain rating scale, medication(s)/side effects and non-pharmacologic comfort measures Outcome: Progressing   Problem: Health Behavior/Discharge Planning: Goal: Ability to manage health-related needs will improve Outcome: Progressing   Problem: Clinical Measurements: Goal: Ability to maintain clinical measurements within normal limits will improve Outcome: Progressing Goal: Will remain free from infection Outcome: Progressing Goal: Diagnostic test results will improve Outcome: Progressing Goal: Respiratory complications will improve Outcome: Progressing Goal: Cardiovascular complication will be avoided Outcome: Progressing   Problem: Activity: Goal: Risk for activity intolerance will decrease Outcome: Progressing   Problem: Nutrition: Goal: Adequate nutrition will be maintained Outcome: Progressing   Problem: Coping: Goal: Level of anxiety will decrease Outcome: Progressing   Problem: Elimination: Goal: Will not experience complications related to bowel motility Outcome: Progressing Goal: Will not experience complications related to urinary retention Outcome: Progressing   Problem: Pain Managment: Goal: General experience of comfort will improve Outcome: Progressing   Problem: Safety: Goal: Ability to remain free from injury will improve Outcome: Progressing   Problem:  Skin Integrity: Goal: Risk for impaired skin integrity will decrease Outcome: Progressing

## 2023-04-14 NOTE — Discharge Summary (Signed)
Physician Discharge Summary   Patient: Beverly Wheeler MRN: [id number] DOB: 02-Jul-1946  Admit date:     04/08/2023  Discharge date: 04/14/23  Discharge Physician: Alba Cory   PCP: [place], Betty G, MD   Recommendations at discharge:    Needs Bmet to follow K level and renal function.  Close follow up with cardiology for adjustment of HF medications.  Needs repeat ECHO to reassess EF>   Discharge Diagnoses: Principal Problem:   COPD exacerbation (HCC) Active Problems:   Ischemic cardiomyopathy  Resolved Problems:   * No resolved hospital problems. *  Hospital Course: 77 year old with past medical history significant for COPD, chronic hypoxic respiratory failure on 2 L of oxygen, CAD, CHF, CKD stage III, hypertension, hyperlipidemia, IDA who presents with progressive fatigue, worsening shortness of breath and cough. Former smoker 40 pack years. She has been using home inhalers and diuretic as prescribed. Patient admitted with acute on chronic systolic and diastolic heart failure exacerbation, ischemic cardiomyopathy, COPD exacerbation. She was started on diuretics and IV steroids. Subsequently transition to oral prednisone. Cardiology consulted and has been assisting with management of heart failure exacerbation.   Assessment and Plan: 1-Acute on Chronic Systolic and Diastolic heart failure exacerbation Pulmonary hypertension Ischemic cardiomyopathy, refused CABG in the past Patient presented with shortness of breath, elevation BNP 626, chest x-ray with pulmonary vascular congestion. -Echo showed ejection fraction 25 to 30%, global hypokinesis, grade 2 diastolic dysfunction, reduced ejection fraction from previous. -Cardiology recommended CABG in 2015, refused surgery at that time. -She received IV Lasix, held due to increased creatinine -Appreciate cardiology recommendation.  Started on ARB.  --1.4 L. -Weight : 205----190 Renal function stable. Cardiology recommend  lasix PRN at discharge. Continue with losartan.    2-COPD exacerbation, acute on chronic hypoxic hypercarbic respiratory failure -Back on 2 L of oxygen He had declined BiPAP Treated initially with IV Solu-Medrol, Completed prednisone.  Completed  azithromycin. Continue DuoNeb, Brovana Pulmicort Completed ceftriaxone for 5 days.    Hypomagnesemia: Replaced   CAD, hyperlipidemia Elevation of troponin LDL 129 Continue aspirin, atorvastatin, Zetia -Was recommended back in 2015, patient declined surgery. Medical management   AKI on CKD 3A: In the setting of diuretics     History of iron deficiency anemia: Continue with iron supplement   Diabetes type 2, hyperglycemia:  SSI.    Mood disorder: Continue with Lexapro   Obesity: Need lifestyle modification           Consultants: cardiology  Procedures performed: ECHO Disposition: Home Diet recommendation:  Discharge Diet Orders (From admission, onward)     Start     Ordered   04/14/23 0000  Diet - low sodium heart healthy        04/14/23 0856           Cardiac diet DISCHARGE MEDICATION: Allergies as of 04/14/2023   No Known Allergies      Medication List     STOP taking these medications    Klor-Con M20 20 MEQ tablet Generic drug: potassium chloride SA       TAKE these medications    Accu-Chek Aviva Plus w/Device Kit Use to test blood sugars 1-2 times daily. Dx: E11.9   Accu-Chek Guide test strip Generic drug: glucose blood USE TO CHECK BLOOD SUGARS 4 TIMES DAILY   Accu-Chek Softclix Lancets lancets Use to check blood sugars 1-2 times daily. Dx:e11.9   albuterol 108 (90 Base) MCG/ACT inhaler Commonly known as: VENTOLIN HFA Inhale 1 puff every 4 hours  Height:       61.0 in Accession #:    [id number]      Weight:       189.2 lb Date of Birth:  [birth year]       BSA:          1.845 m Patient Age:    76 years        BP:           124/45 mmHg Patient Gender: F               HR:           81 bpm. Exam Location:  Inpatient Procedure: 2D Echo, Cardiac Doppler, Color Doppler and Intracardiac            Opacification Agent Indications:    CHF  History:        Patient has prior history of Echocardiogram examinations, most                 recent 11/24/2017. Previous Myocardial Infarction and CAD, COPD;                 Risk Factors:Hypertension, Dyslipidemia and Former Smoker. At                 home oxygen therapy.  Sonographer:    Milda Smart Referring Phys: Dolly Rias  Sonographer Comments: Image acquisition challenging due to patient body habitus, Image  acquisition challenging due to respiratory motion and Image acquisition challenging due to COPD. IMPRESSIONS  1. There is no left ventricular thrombus (Definity contrast was used). The apical myocardium is thin and hyperechoic, consistent with scar. Left ventricular ejection fraction, by estimation, is 25 to 30%. The left ventricle has severely decreased function. The left ventricle demonstrates global hypokinesis. The left ventricular internal cavity size was moderately dilated. Left ventricular diastolic parameters are consistent with Grade II diastolic dysfunction (pseudonormalization). Elevated left atrial pressure. There is akinesis of the left ventricular, mid-apical anteroseptal wall, anterior wall and apical segment. There is severe hypokinesis of the left ventricular, entire inferior wall and inferolateral wall.  2. Right ventricular systolic function is normal. The right ventricular size is normal. Tricuspid regurgitation signal is inadequate for assessing PA pressure.  3. Left atrial size was moderately dilated.  4. Right atrial size was mildly dilated.  5. The mitral valve is possibly rheumatic. Mild to moderate mitral valve regurgitation. Mild mitral stenosis. The mean mitral valve gradient is 5.5 mmHg with average heart rate of 77 bpm.  6. The aortic valve is tricuspid. Aortic valve regurgitation is not visualized. Mild aortic valve stenosis. Aortic valve area, by VTI measures 1.56 cm. Aortic valve mean gradient measures 12.0 mmHg. Aortic valve Vmax measures 2.10 m/s.  7. The inferior vena cava is dilated in size with <50% respiratory variability, suggesting right atrial pressure of 15 mmHg. Comparison(s): Prior images reviewed side by side. The left ventricular function is worsened. The anteroapical akinesis is unchanged. There is worsened inferior and inferolateral hypokinesis. FINDINGS  Left Ventricle: There is no left ventricular thrombus (Definity contrast was used). The apical myocardium is thin  and hyperechoic, consistent with scar. Left ventricular ejection fraction, by estimation, is 25 to 30%. The left ventricle has severely decreased function. The left ventricle demonstrates global hypokinesis. Severe hypokinesis of the left ventricular, entire inferior wall and inferolateral wall. Definity contrast agent was given IV to delineate the left ventricular endocardial borders. The left ventricular internal cavity size was moderately dilated. There is borderline  Height:       61.0 in Accession #:    [id number]      Weight:       189.2 lb Date of Birth:  [birth year]       BSA:          1.845 m Patient Age:    76 years        BP:           124/45 mmHg Patient Gender: F               HR:           81 bpm. Exam Location:  Inpatient Procedure: 2D Echo, Cardiac Doppler, Color Doppler and Intracardiac            Opacification Agent Indications:    CHF  History:        Patient has prior history of Echocardiogram examinations, most                 recent 11/24/2017. Previous Myocardial Infarction and CAD, COPD;                 Risk Factors:Hypertension, Dyslipidemia and Former Smoker. At                 home oxygen therapy.  Sonographer:    Milda Smart Referring Phys: Dolly Rias  Sonographer Comments: Image acquisition challenging due to patient body habitus, Image  acquisition challenging due to respiratory motion and Image acquisition challenging due to COPD. IMPRESSIONS  1. There is no left ventricular thrombus (Definity contrast was used). The apical myocardium is thin and hyperechoic, consistent with scar. Left ventricular ejection fraction, by estimation, is 25 to 30%. The left ventricle has severely decreased function. The left ventricle demonstrates global hypokinesis. The left ventricular internal cavity size was moderately dilated. Left ventricular diastolic parameters are consistent with Grade II diastolic dysfunction (pseudonormalization). Elevated left atrial pressure. There is akinesis of the left ventricular, mid-apical anteroseptal wall, anterior wall and apical segment. There is severe hypokinesis of the left ventricular, entire inferior wall and inferolateral wall.  2. Right ventricular systolic function is normal. The right ventricular size is normal. Tricuspid regurgitation signal is inadequate for assessing PA pressure.  3. Left atrial size was moderately dilated.  4. Right atrial size was mildly dilated.  5. The mitral valve is possibly rheumatic. Mild to moderate mitral valve regurgitation. Mild mitral stenosis. The mean mitral valve gradient is 5.5 mmHg with average heart rate of 77 bpm.  6. The aortic valve is tricuspid. Aortic valve regurgitation is not visualized. Mild aortic valve stenosis. Aortic valve area, by VTI measures 1.56 cm. Aortic valve mean gradient measures 12.0 mmHg. Aortic valve Vmax measures 2.10 m/s.  7. The inferior vena cava is dilated in size with <50% respiratory variability, suggesting right atrial pressure of 15 mmHg. Comparison(s): Prior images reviewed side by side. The left ventricular function is worsened. The anteroapical akinesis is unchanged. There is worsened inferior and inferolateral hypokinesis. FINDINGS  Left Ventricle: There is no left ventricular thrombus (Definity contrast was used). The apical myocardium is thin  and hyperechoic, consistent with scar. Left ventricular ejection fraction, by estimation, is 25 to 30%. The left ventricle has severely decreased function. The left ventricle demonstrates global hypokinesis. Severe hypokinesis of the left ventricular, entire inferior wall and inferolateral wall. Definity contrast agent was given IV to delineate the left ventricular endocardial borders. The left ventricular internal cavity size was moderately dilated. There is borderline  Height:       61.0 in Accession #:    [id number]      Weight:       189.2 lb Date of Birth:  [birth year]       BSA:          1.845 m Patient Age:    76 years        BP:           124/45 mmHg Patient Gender: F               HR:           81 bpm. Exam Location:  Inpatient Procedure: 2D Echo, Cardiac Doppler, Color Doppler and Intracardiac            Opacification Agent Indications:    CHF  History:        Patient has prior history of Echocardiogram examinations, most                 recent 11/24/2017. Previous Myocardial Infarction and CAD, COPD;                 Risk Factors:Hypertension, Dyslipidemia and Former Smoker. At                 home oxygen therapy.  Sonographer:    Milda Smart Referring Phys: Dolly Rias  Sonographer Comments: Image acquisition challenging due to patient body habitus, Image  acquisition challenging due to respiratory motion and Image acquisition challenging due to COPD. IMPRESSIONS  1. There is no left ventricular thrombus (Definity contrast was used). The apical myocardium is thin and hyperechoic, consistent with scar. Left ventricular ejection fraction, by estimation, is 25 to 30%. The left ventricle has severely decreased function. The left ventricle demonstrates global hypokinesis. The left ventricular internal cavity size was moderately dilated. Left ventricular diastolic parameters are consistent with Grade II diastolic dysfunction (pseudonormalization). Elevated left atrial pressure. There is akinesis of the left ventricular, mid-apical anteroseptal wall, anterior wall and apical segment. There is severe hypokinesis of the left ventricular, entire inferior wall and inferolateral wall.  2. Right ventricular systolic function is normal. The right ventricular size is normal. Tricuspid regurgitation signal is inadequate for assessing PA pressure.  3. Left atrial size was moderately dilated.  4. Right atrial size was mildly dilated.  5. The mitral valve is possibly rheumatic. Mild to moderate mitral valve regurgitation. Mild mitral stenosis. The mean mitral valve gradient is 5.5 mmHg with average heart rate of 77 bpm.  6. The aortic valve is tricuspid. Aortic valve regurgitation is not visualized. Mild aortic valve stenosis. Aortic valve area, by VTI measures 1.56 cm. Aortic valve mean gradient measures 12.0 mmHg. Aortic valve Vmax measures 2.10 m/s.  7. The inferior vena cava is dilated in size with <50% respiratory variability, suggesting right atrial pressure of 15 mmHg. Comparison(s): Prior images reviewed side by side. The left ventricular function is worsened. The anteroapical akinesis is unchanged. There is worsened inferior and inferolateral hypokinesis. FINDINGS  Left Ventricle: There is no left ventricular thrombus (Definity contrast was used). The apical myocardium is thin  and hyperechoic, consistent with scar. Left ventricular ejection fraction, by estimation, is 25 to 30%. The left ventricle has severely decreased function. The left ventricle demonstrates global hypokinesis. Severe hypokinesis of the left ventricular, entire inferior wall and inferolateral wall. Definity contrast agent was given IV to delineate the left ventricular endocardial borders. The left ventricular internal cavity size was moderately dilated. There is borderline  Height:       61.0 in Accession #:    [id number]      Weight:       189.2 lb Date of Birth:  [birth year]       BSA:          1.845 m Patient Age:    76 years        BP:           124/45 mmHg Patient Gender: F               HR:           81 bpm. Exam Location:  Inpatient Procedure: 2D Echo, Cardiac Doppler, Color Doppler and Intracardiac            Opacification Agent Indications:    CHF  History:        Patient has prior history of Echocardiogram examinations, most                 recent 11/24/2017. Previous Myocardial Infarction and CAD, COPD;                 Risk Factors:Hypertension, Dyslipidemia and Former Smoker. At                 home oxygen therapy.  Sonographer:    Milda Smart Referring Phys: Dolly Rias  Sonographer Comments: Image acquisition challenging due to patient body habitus, Image  acquisition challenging due to respiratory motion and Image acquisition challenging due to COPD. IMPRESSIONS  1. There is no left ventricular thrombus (Definity contrast was used). The apical myocardium is thin and hyperechoic, consistent with scar. Left ventricular ejection fraction, by estimation, is 25 to 30%. The left ventricle has severely decreased function. The left ventricle demonstrates global hypokinesis. The left ventricular internal cavity size was moderately dilated. Left ventricular diastolic parameters are consistent with Grade II diastolic dysfunction (pseudonormalization). Elevated left atrial pressure. There is akinesis of the left ventricular, mid-apical anteroseptal wall, anterior wall and apical segment. There is severe hypokinesis of the left ventricular, entire inferior wall and inferolateral wall.  2. Right ventricular systolic function is normal. The right ventricular size is normal. Tricuspid regurgitation signal is inadequate for assessing PA pressure.  3. Left atrial size was moderately dilated.  4. Right atrial size was mildly dilated.  5. The mitral valve is possibly rheumatic. Mild to moderate mitral valve regurgitation. Mild mitral stenosis. The mean mitral valve gradient is 5.5 mmHg with average heart rate of 77 bpm.  6. The aortic valve is tricuspid. Aortic valve regurgitation is not visualized. Mild aortic valve stenosis. Aortic valve area, by VTI measures 1.56 cm. Aortic valve mean gradient measures 12.0 mmHg. Aortic valve Vmax measures 2.10 m/s.  7. The inferior vena cava is dilated in size with <50% respiratory variability, suggesting right atrial pressure of 15 mmHg. Comparison(s): Prior images reviewed side by side. The left ventricular function is worsened. The anteroapical akinesis is unchanged. There is worsened inferior and inferolateral hypokinesis. FINDINGS  Left Ventricle: There is no left ventricular thrombus (Definity contrast was used). The apical myocardium is thin  and hyperechoic, consistent with scar. Left ventricular ejection fraction, by estimation, is 25 to 30%. The left ventricle has severely decreased function. The left ventricle demonstrates global hypokinesis. Severe hypokinesis of the left ventricular, entire inferior wall and inferolateral wall. Definity contrast agent was given IV to delineate the left ventricular endocardial borders. The left ventricular internal cavity size was moderately dilated. There is borderline  Physician Discharge Summary   Patient: Beverly Wheeler MRN: [id number] DOB: 02-Jul-1946  Admit date:     04/08/2023  Discharge date: 04/14/23  Discharge Physician: Alba Cory   PCP: [place], Betty G, MD   Recommendations at discharge:    Needs Bmet to follow K level and renal function.  Close follow up with cardiology for adjustment of HF medications.  Needs repeat ECHO to reassess EF>   Discharge Diagnoses: Principal Problem:   COPD exacerbation (HCC) Active Problems:   Ischemic cardiomyopathy  Resolved Problems:   * No resolved hospital problems. *  Hospital Course: 77 year old with past medical history significant for COPD, chronic hypoxic respiratory failure on 2 L of oxygen, CAD, CHF, CKD stage III, hypertension, hyperlipidemia, IDA who presents with progressive fatigue, worsening shortness of breath and cough. Former smoker 40 pack years. She has been using home inhalers and diuretic as prescribed. Patient admitted with acute on chronic systolic and diastolic heart failure exacerbation, ischemic cardiomyopathy, COPD exacerbation. She was started on diuretics and IV steroids. Subsequently transition to oral prednisone. Cardiology consulted and has been assisting with management of heart failure exacerbation.   Assessment and Plan: 1-Acute on Chronic Systolic and Diastolic heart failure exacerbation Pulmonary hypertension Ischemic cardiomyopathy, refused CABG in the past Patient presented with shortness of breath, elevation BNP 626, chest x-ray with pulmonary vascular congestion. -Echo showed ejection fraction 25 to 30%, global hypokinesis, grade 2 diastolic dysfunction, reduced ejection fraction from previous. -Cardiology recommended CABG in 2015, refused surgery at that time. -She received IV Lasix, held due to increased creatinine -Appreciate cardiology recommendation.  Started on ARB.  --1.4 L. -Weight : 205----190 Renal function stable. Cardiology recommend  lasix PRN at discharge. Continue with losartan.    2-COPD exacerbation, acute on chronic hypoxic hypercarbic respiratory failure -Back on 2 L of oxygen He had declined BiPAP Treated initially with IV Solu-Medrol, Completed prednisone.  Completed  azithromycin. Continue DuoNeb, Brovana Pulmicort Completed ceftriaxone for 5 days.    Hypomagnesemia: Replaced   CAD, hyperlipidemia Elevation of troponin LDL 129 Continue aspirin, atorvastatin, Zetia -Was recommended back in 2015, patient declined surgery. Medical management   AKI on CKD 3A: In the setting of diuretics     History of iron deficiency anemia: Continue with iron supplement   Diabetes type 2, hyperglycemia:  SSI.    Mood disorder: Continue with Lexapro   Obesity: Need lifestyle modification           Consultants: cardiology  Procedures performed: ECHO Disposition: Home Diet recommendation:  Discharge Diet Orders (From admission, onward)     Start     Ordered   04/14/23 0000  Diet - low sodium heart healthy        04/14/23 0856           Cardiac diet DISCHARGE MEDICATION: Allergies as of 04/14/2023   No Known Allergies      Medication List     STOP taking these medications    Klor-Con M20 20 MEQ tablet Generic drug: potassium chloride SA       TAKE these medications    Accu-Chek Aviva Plus w/Device Kit Use to test blood sugars 1-2 times daily. Dx: E11.9   Accu-Chek Guide test strip Generic drug: glucose blood USE TO CHECK BLOOD SUGARS 4 TIMES DAILY   Accu-Chek Softclix Lancets lancets Use to check blood sugars 1-2 times daily. Dx:e11.9   albuterol 108 (90 Base) MCG/ACT inhaler Commonly known as: VENTOLIN HFA Inhale 1 puff every 4 hours  Height:       61.0 in Accession #:    [id number]      Weight:       189.2 lb Date of Birth:  [birth year]       BSA:          1.845 m Patient Age:    76 years        BP:           124/45 mmHg Patient Gender: F               HR:           81 bpm. Exam Location:  Inpatient Procedure: 2D Echo, Cardiac Doppler, Color Doppler and Intracardiac            Opacification Agent Indications:    CHF  History:        Patient has prior history of Echocardiogram examinations, most                 recent 11/24/2017. Previous Myocardial Infarction and CAD, COPD;                 Risk Factors:Hypertension, Dyslipidemia and Former Smoker. At                 home oxygen therapy.  Sonographer:    Milda Smart Referring Phys: Dolly Rias  Sonographer Comments: Image acquisition challenging due to patient body habitus, Image  acquisition challenging due to respiratory motion and Image acquisition challenging due to COPD. IMPRESSIONS  1. There is no left ventricular thrombus (Definity contrast was used). The apical myocardium is thin and hyperechoic, consistent with scar. Left ventricular ejection fraction, by estimation, is 25 to 30%. The left ventricle has severely decreased function. The left ventricle demonstrates global hypokinesis. The left ventricular internal cavity size was moderately dilated. Left ventricular diastolic parameters are consistent with Grade II diastolic dysfunction (pseudonormalization). Elevated left atrial pressure. There is akinesis of the left ventricular, mid-apical anteroseptal wall, anterior wall and apical segment. There is severe hypokinesis of the left ventricular, entire inferior wall and inferolateral wall.  2. Right ventricular systolic function is normal. The right ventricular size is normal. Tricuspid regurgitation signal is inadequate for assessing PA pressure.  3. Left atrial size was moderately dilated.  4. Right atrial size was mildly dilated.  5. The mitral valve is possibly rheumatic. Mild to moderate mitral valve regurgitation. Mild mitral stenosis. The mean mitral valve gradient is 5.5 mmHg with average heart rate of 77 bpm.  6. The aortic valve is tricuspid. Aortic valve regurgitation is not visualized. Mild aortic valve stenosis. Aortic valve area, by VTI measures 1.56 cm. Aortic valve mean gradient measures 12.0 mmHg. Aortic valve Vmax measures 2.10 m/s.  7. The inferior vena cava is dilated in size with <50% respiratory variability, suggesting right atrial pressure of 15 mmHg. Comparison(s): Prior images reviewed side by side. The left ventricular function is worsened. The anteroapical akinesis is unchanged. There is worsened inferior and inferolateral hypokinesis. FINDINGS  Left Ventricle: There is no left ventricular thrombus (Definity contrast was used). The apical myocardium is thin  and hyperechoic, consistent with scar. Left ventricular ejection fraction, by estimation, is 25 to 30%. The left ventricle has severely decreased function. The left ventricle demonstrates global hypokinesis. Severe hypokinesis of the left ventricular, entire inferior wall and inferolateral wall. Definity contrast agent was given IV to delineate the left ventricular endocardial borders. The left ventricular internal cavity size was moderately dilated. There is borderline

## 2023-04-14 NOTE — Progress Notes (Signed)
Physical Therapy Treatment Patient Details Name: Beverly Wheeler MRN: [id number] DOB: [birth year] Today's Date: 04/14/2023   History of Present Illness 77 y.o. female with hx of COPD, chronic hypoxic respiratory failure on 2 L O2, coronary artery disease, MI, heart failure with reduced ejection fraction, CKD 3, hypertension, diabetes, IDA, who presents with few days of progressive fatigue, new shortness of breath and cough    PT Comments  Pt received in recliner.  CGA transfers and amb 150' x 2 with rollator. Seated rest break between gait trials. SpO2 95% at rest on 2L. SpO2 92% on 3L during amb. Pt returned to recliner at end of session. Pt reporting her desire to return home. She relates that her son is able to provide needed level of support. Recommending HHPT.    If plan is discharge home, recommend the following: A little help with walking and/or transfers;A little help with bathing/dressing/bathroom;Assistance with cooking/housework;Assist for transportation;Help with stairs or ramp for entrance   Can travel by private vehicle        Equipment Recommendations  None recommended by PT    Recommendations for Other Services       Precautions / Restrictions Precautions Precautions: Fall;Other (comment) Precaution Comments: Watch O2 Restrictions Weight Bearing Restrictions: No     Mobility  Bed Mobility               General bed mobility comments: Pt up in recliner.    Transfers Overall transfer level: Needs assistance Equipment used: Rollator (4 wheels) Transfers: Sit to/from Stand, Bed to chair/wheelchair/BSC Sit to Stand: Contact guard assist   Step pivot transfers: Contact guard assist            Ambulation/Gait Ambulation/Gait assistance: Contact guard assist Gait Distance (Feet): 150 Feet (x 2) Assistive device: Rollator (4 wheels) Gait Pattern/deviations: Step-through pattern Gait velocity: decreased Gait velocity interpretation: <1.31 ft/sec,  indicative of household ambulator   General Gait Details: Seated rest break on rollator between gait trials. Amb on 2L first trial with desat to 89%. Increased to 3L for 2nd gait trial with SpO2 92%. Returned to 2L in room at rest with SpO2 95%.   Stairs             Wheelchair Mobility     Tilt Bed    Modified Rankin (Stroke Patients Only)       Balance Overall balance assessment: Needs assistance Sitting-balance support: Feet supported Sitting balance-Leahy Scale: Good     Standing balance support: Reliant on assistive device for balance, Bilateral upper extremity supported Standing balance-Leahy Scale: Poor                              Cognition Arousal: Alert Behavior During Therapy: WFL for tasks assessed/performed Overall Cognitive Status: Within Functional Limits for tasks assessed                                          Exercises      General Comments        Pertinent Vitals/Pain Pain Assessment Pain Assessment: No/denies pain    Home Living                          Prior Function            PT Goals (current goals  can now be found in the care plan section) Acute Rehab PT Goals Patient Stated Goal: home Progress towards PT goals: Progressing toward goals    Frequency    Min 1X/week      PT Plan      Co-evaluation              AM-PAC PT "6 Clicks" Mobility   Outcome Measure  Help needed turning from your back to your side while in a flat bed without using bedrails?: A Little Help needed moving from lying on your back to sitting on the side of a flat bed without using bedrails?: A Little Help needed moving to and from a bed to a chair (including a wheelchair)?: A Little Help needed standing up from a chair using your arms (e.g., wheelchair or bedside chair)?: A Little Help needed to walk in hospital room?: A Little Help needed climbing 3-5 steps with a railing? : A Lot 6 Click Score:  17    End of Session Equipment Utilized During Treatment: Gait belt;Oxygen Activity Tolerance: Patient tolerated treatment well Patient left: in chair;with call bell/phone within reach Nurse Communication: Mobility status PT Visit Diagnosis: Unsteadiness on feet (R26.81);Muscle weakness (generalized) (M62.81);Difficulty in walking, not elsewhere classified (R26.2)     Time: 0912-0927 PT Time Calculation (min) (ACUTE ONLY): 15 min  Charges:    $Gait Training: 8-22 mins PT General Charges $$ ACUTE PT VISIT: 1 Visit                     Ferd Glassing., PT  Office # [phone]    Ilda Foil 04/14/2023, 10:30 AM

## 2023-04-14 NOTE — Progress Notes (Signed)
   Heart Failure Stewardship Pharmacist Progress Note   PCP: [place], Betty G, MD PCP-Cardiologist: Thurmon Fair, MD    HPI:  77 yo F with PMH of CAD, CHF, HTN, HLD, diabetes, CKD III, and COPD.  In 2015, admitted for STEMI. Underwent cath that showed significant 3 vessel disease. EF 30-35%. She declined CABG. ECHO 11/2017 showed EF 35-40%.   Admitted on 9/14 with weakness and shortness of breath. CXR with pulmonary vascular congestion. BNP elevated. ECHO on 9/15 showed LVEF reduced further to 25-30%, global hypokinesis, G2DD, RV normal, mild to moderate MR, mild AS. She is being treated for COPD and CHF exacerbation.   Discharge HF Medications: Diuretic: furosemide 40 mg daily PRN ACE/ARB/ARNI: losartan 12.5 mg daily  Prior to admission HF Medications: Diuretic: furosemide 40 mg daily  Pertinent Lab Values: As of 91/19: Serum creatinine 1.42, BUN 26, Potassium 4.5, Sodium 136, BNP 626.5, Magnesium 1.9  Vital Signs: Weight: 190 lbs (admission weight: 189 lbs) Blood pressure: 120/50s  Heart rate: 60s  I/O: net -1L since admission  Medication Assistance / Insurance Benefits Check: Does the patient have prescription insurance?  Yes Type of insurance plan: UHC Medicare + [facility]  Outpatient Pharmacy:  [facility] Is the patient willing to use [facility] TOC pharmacy at discharge? Yes Is the patient willing to transition their outpatient pharmacy to utilize a [facility] outpatient pharmacy?   Yes - transitioning to [facility] mail order    Assessment: 1. Acute on chronic systolic CHF (LVEF 25-30%), due to ICM. NYHA class II symptoms. - Held IV lasix with AKI. Creatinine improved and is relatively stable. Agree with furosemide 40 mg daily PRN at discharge. Strict I/Os and daily weights. Keep K>4 and Mg>2. - BB limited with lung disease, on nebivolol - Continue losartan 12.5 mg daily - Consider starting spironolactone 12.5 mg daily  - Poor candidate for SGLT2i -  GDMT limited in the setting of chronic respiratory failure, renal disease, and poor hygiene.    Plan: 1) Medication changes recommended at this time: - Start spironolactone 12.5 mg daily   2) Patient assistance: - None pending, has Medicare + [facility] - States that sometimes it is difficult to pay for 90 day prescriptions and relies on her son to pick up her medications from the pharmacy - Agreeable to switch to [facility] mail order pharmacy. Address and phone number confirmed.  - [facility] the more expensive prescriptions to her home.  3)  Education  - Patient has been educated on current HF medications and potential additions to HF medication regimen - Patient verbalizes understanding that over the next few months, these medication doses may change and more medications may be added to optimize HF regimen - Patient has been educated on basic disease state pathophysiology and goals of therapy   Sharen Hones, PharmD, BCPS Heart Failure Stewardship Pharmacist Phone [phone]

## 2023-04-18 ENCOUNTER — Telehealth: Payer: Self-pay | Admitting: Family Medicine

## 2023-04-18 NOTE — Telephone Encounter (Signed)
Pt requested to delay start of care until 04/27/23

## 2023-04-18 NOTE — Telephone Encounter (Signed)
Noted  

## 2023-04-19 ENCOUNTER — Other Ambulatory Visit: Payer: Self-pay | Admitting: Family Medicine

## 2023-04-19 DIAGNOSIS — E1169 Type 2 diabetes mellitus with other specified complication: Secondary | ICD-10-CM

## 2023-04-23 ENCOUNTER — Other Ambulatory Visit: Payer: Self-pay | Admitting: Family Medicine

## 2023-04-25 ENCOUNTER — Ambulatory Visit: Payer: Medicare Other | Admitting: Family Medicine

## 2023-04-25 ENCOUNTER — Other Ambulatory Visit: Payer: Self-pay | Admitting: Family Medicine

## 2023-04-27 ENCOUNTER — Encounter (HOSPITAL_COMMUNITY): Payer: Medicare Other

## 2023-05-02 ENCOUNTER — Ambulatory Visit: Payer: Medicare Other | Admitting: Family Medicine

## 2023-05-08 NOTE — Progress Notes (Signed)
[facility] Transitions of Care Clinic Heart Failure Pharmacist Encounter  PCP: [place], Betty G, MD PCP-Cardiologist: Thurmon Fair, MD  HPI: [phone] with PMH of CAD, CHF, HTN, HLD, T2DM, CKD III, and COPD  Recently admitted 04/08/23 to 04/14/23 after presenting with weakness and SOB. CXR showed pulmonary vascular congestion. BNP elevated. Echo on 9/15 showed LVEF 25-30% (previously 30-35% in 2015 in the setting of STEMI, noted to have 3v CAD - declined CABG), global hypokinesis, G2DD, RV normal, mild to moderate MR, mild AS. Was treated for COPD and CHF exacerbation.   Today, Ariele Vidrio presents to the Heart Failure [facility] for follow up with her son, who currently lives with her. Pt reports that she is doing OK since hospital discharge. She reports reduced functional independence for the past few years. Currently she is limited by SOB with walking short distances, but would like to be able to get strong enough to go outside of the house, shop, etc. At home she is mostly sedentary, gets up to go to the bathroom. Attempting to limit salt - son helps her with buying food. Reports occasional canned soup. Does not smoke (quit after MI in 2015) or use alcohol. She is monitoring her weight daily (stable 186-188 lbs). Bilateral LE edema noted on exam today. She reports it has been ~4 days since she last took furosemide - is currently dosing when she notices ~2 lbs weight gain during the day. On 2L O2 at home.   Reports that she is taking medications as prescribed- denies missed doses. Requests refill of her Elwin Sleight- appears refills are on file at [facility]. [facility] vs[facility] to [facility] mail order pharmacy.   HF Medications: Diuretic: furosemide 40 mg PO daily PRN Beta Blocker: nebivolol 5 mg PO daily ACE/ARB/ARNI: losartan 12.5 mg PO daily  Has the patient been experiencing any side effects to the medications prescribed?  no  Does the patient have  any problems obtaining medications due to transportation or finances?   Yes - [facility] They report this is working well at the moment.   Understanding of regimen: fair Understanding of indications: fair Potential of compliance: fair Patient understands to avoid NSAIDs. Patient understands to avoid decongestants.   Pertinent Lab Values: CMP in progress 05/09/23  Vital Signs: Weight: 189 lbs (discharge weight: 190 lbs) Blood pressure: 110/60 mmHg Heart rate: 73  Medication Assistance / Insurance Benefits Check: Does the patient have prescription insurance?  Yes Type of insurance plan: Medicare + [facility]  Outpatient Pharmacy:  [facility] Was the [facility] pharmacy used to supply discharge medications? yes  If TOC pharmacy was used, were the refills transferred out to current pharmacy yet? no  Is the patient willing to transition their outpatient pharmacy to utilize a [facility] outpatient pharmacy with or without mail order?   No - considered transferring to [facility] mail order while admitted, but today states that [facility].  Assessment: 1) Chronic systolic CHF (EF 60-45%), likely due to ischemic causes (severe 3v CABG) with symptoms complicated by severe COPD. NYHA class IV symptoms. - Ability to titrate/optimize beta-blocker limited by COPD/lung disease. Stable on nebivolol - GDMT noted to be overall limited in setting of chronic respiratory failure, renal disease, and poor hygiene. Not a good candidate for SGLT2i given risk of UTI. - Bilateral edema noted on exam, despite weight being stable since discharge. Addition  of MRA has potential to help with diuresis and improvement in heart pumping function. K was ULN during most recent hospitalization, but would be appropriate to initiate with close f/u. BP low normal, without symptoms of hypotension.  Plan: 1) Medication changes: -  Instructed patient to take furosemide  40 mg PO daily x 1-2 doses for diuresis - Start spironolactone 12.5 mg PO daily pending CMP - Continue nebivolol 5 mg PO daily - Continue losartan 12.5 mg PO daily  2) Patient Assistance: - No patient assistance needs noted at this time.  - Transferred refills of Dulera (mometasone-formoterol) from [facility] to preferred pharmacy  3) Follow up: - Next appointment with Dr. Royann Shivers on 06/14/23 - requesting to switch to Dr. Shearon Balo, PharmD PGY1 Pharmacy Resident

## 2023-05-08 NOTE — Progress Notes (Incomplete)
HEART & VASCULAR TRANSITION OF CARE CONSULT NOTE     Referring Physician: Dr. Sunnie Nielsen Primary Care: Primary Cardiologist: Dr. Royann Shivers  HPI: Referred to clinic by Dr. Sunnie Nielsen with [facility] for heart failure consultation. 77 y.o. female with history of ***     Past Medical History:  Diagnosis Date   Allergy    Asthma    CAD (coronary artery disease)    COPD (chronic obstructive pulmonary disease) (HCC)    Depression    GERD (gastroesophageal reflux disease)    Hyperlipidemia    Hypertension    Myocardial infarction (HCC) 10/16/2013    Current Outpatient Medications  Medication Sig Dispense Refill   ACCU-CHEK GUIDE test strip USE TO CHECK BLOOD SUGARS 4 TIMES DAILY 100 strip 1   Accu-Chek Softclix Lancets lancets Use to check blood sugars 1-2 times daily. Dx:e11.9 100 each 12   albuterol (VENTOLIN HFA) 108 (90 Base) MCG/ACT inhaler Inhale 1 puff every 4 hours as needed for wheezing or shortness of breath. 18 each 2   aspirin EC 81 MG EC tablet Take 1 tablet (81 mg total) by mouth daily.     atorvastatin (LIPITOR) 80 MG tablet TAKE 1 TABLET BY MOUTH EVERY DAY AT 6PM 90 tablet 0   Blood Glucose Monitoring Suppl (ACCU-CHEK AVIVA PLUS) w/Device KIT Use to test blood sugars 1-2 times daily. Dx: E11.9 1 kit 0   cetirizine (ZYRTEC) 10 MG tablet Take 10 mg by mouth daily.     escitalopram (LEXAPRO) 20 MG tablet TAKE 1 TABLET BY MOUTH EVERY DAY 90 tablet 0   ezetimibe (ZETIA) 10 MG tablet TAKE 1 TABLET BY MOUTH EVERY DAY 30 tablet 0   ferrous gluconate (FERGON) 324 MG tablet TAKE 1 TABLET BY MOUTH TWICE A DAY WITH MEALS 180 tablet 1   furosemide (LASIX) 40 MG tablet Take 1 tablet (40 mg total) by mouth daily as needed. Take one tablet of lasix if your weight increases by 2 pounds in 24 hours 90 tablet 1   glipiZIDE (GLUCOTROL) 5 MG tablet TAKE 1 TABLET BY MOUTH DAILY BEFORE BREAKFAST. SCHEDULE APPOINTMENT FOR FUTURE REFILLS 90 tablet 0   guaiFENesin (MUCINEX) 600 MG 12 hr tablet Take  1 tablet (600 mg total) by mouth 2 (two) times daily. 30 tablet 0   losartan (COZAAR) 25 MG tablet Take 0.5 tablets (12.5 mg total) by mouth daily. 30 tablet 0   metFORMIN (GLUCOPHAGE) 500 MG tablet TAKE 1 TABLET BY MOUTH 2 TIMES DAILY WITH A MEAL. 180 tablet 1   mometasone-formoterol (DULERA) 200-5 MCG/ACT AERO Inhale 2 puffs into the lungs daily. 13 g 2   montelukast (SINGULAIR) 10 MG tablet Take 1 tablet (10 mg total) by mouth at bedtime. 30 tablet 3   nebivolol (BYSTOLIC) 5 MG tablet TAKE 1 TABLET BY MOUTH EVERY DAY 90 tablet 1   nitroGLYCERIN (NITROSTAT) 0.4 MG SL tablet Place 1 tablet (0.4 mg total) under the tongue every 5 (five) minutes as needed for chest pain. 25 tablet 0   pantoprazole (PROTONIX) 40 MG tablet Take 1 tablet (40 mg total) by mouth daily. 30 tablet 0   triamcinolone (NASACORT ALLERGY 24HR) 55 MCG/ACT AERO nasal inhaler Place 2 sprays into the nose daily.     vitamin B-12 1000 MCG tablet Take 1 tablet (1,000 mcg total) by mouth daily.     No current facility-administered medications for this visit.    No Known Allergies    Social History   Socioeconomic History   Marital  status: Widowed    Spouse name: Not on file   Number of children: 1   Years of education: Not on file   Highest education level: High school graduate  Occupational History   Occupation: retired  Tobacco Use   Smoking status: Former    Current packs/day: 0.00    Average packs/day: 1 pack/day for 45.0 years (45.0 ttl pk-yrs)    Types: Cigarettes    Start date: 10/16/1968    Quit date: 10/16/2013    Years since quitting: 9.5   Smokeless tobacco: Never  Vaping Use   Vaping status: Never Used  Substance and Sexual Activity   Alcohol use: Not Currently    Comment: occasional   Drug use: No   Sexual activity: Not Currently  Other Topics Concern   Not on file  Social History Narrative   Not on file   Social Determinants of Health   Financial Resource Strain: Low Risk  (04/12/2023)    Overall Financial Resource Strain (CARDIA)    Difficulty of Paying Living Expenses: Not hard at all  Food Insecurity: No Food Insecurity (04/09/2023)   Hunger Vital Sign    Worried About Running Out of Food in the Last Year: Never true    Ran Out of Food in the Last Year: Never true  Transportation Needs: No Transportation Needs (04/12/2023)   PRAPARE - Administrator, Civil Service (Medical): No    Lack of Transportation (Non-Medical): No  Physical Activity: Inactive (05/02/2022)   Exercise Vital Sign    Days of Exercise per Week: 0 days    Minutes of Exercise per Session: 0 min  Stress: No Stress Concern Present (05/02/2022)   [employer] of Occupational Health - Occupational Stress Questionnaire    Feeling of Stress : Not at all  Social Connections: Socially Isolated (04/29/2021)   Social Connection and Isolation Panel [NHANES]    Frequency of Communication with Friends and Family: Twice a week    Frequency of Social Gatherings with Friends and Family: Twice a week    Attends Religious Services: Never    Database administrator or Organizations: No    Attends Banker Meetings: Never    Marital Status: Widowed  Intimate Partner Violence: Not At Risk (04/09/2023)   Humiliation, Afraid, Rape, and Kick questionnaire    Fear of Current or Ex-Partner: No    Emotionally Abused: No    Physically Abused: No    Sexually Abused: No      Family History  Problem Relation Age of Onset   Allergies Mother    Breast cancer Mother    Arthritis Mother    Cancer Mother    Hyperlipidemia Mother    Hypertension Mother    Heart disease Father    Heart disease Brother    Arthritis Brother    Cancer Brother    Depression Brother    Drug abuse Brother    Hyperlipidemia Brother    Heart attack Brother    Rheum arthritis Brother    Cancer Sister    Dementia Sister     There were no vitals filed for this visit.  PHYSICAL EXAM: General:  Well appearing. No  respiratory difficulty HEENT: normal Neck: supple. no JVD. Carotids 2+ bilat; no bruits. No lymphadenopathy or thryomegaly appreciated. Cor: PMI nondisplaced. Regular rate & rhythm. No rubs, gallops or murmurs. Lungs: clear Abdomen: soft, nontender, nondistended. No hepatosplenomegaly. No bruits or masses. Good bowel sounds. Extremities: no cyanosis, clubbing, rash,  edema Neuro: alert & oriented x 3, cranial nerves grossly intact. moves all 4 extremities w/o difficulty. Affect pleasant.  ECG:   ASSESSMENT & PLAN:  NYHA *** GDMT  Diuretic- BB- Ace/ARB/ARNI MRA SGLT2i    Referred to HFSW (PCP, Medications, Transportation, ETOH Abuse, Drug Abuse, Insurance, Financial ): Yes or No Refer to Pharmacy: Yes or No Refer to Home Health: Yes on No Refer to Advanced Heart Failure Clinic: Yes or no  Refer to General Cardiology: Yes or No  Follow up

## 2023-05-09 ENCOUNTER — Other Ambulatory Visit (HOSPITAL_COMMUNITY): Payer: Self-pay

## 2023-05-09 ENCOUNTER — Ambulatory Visit (HOSPITAL_COMMUNITY)
Admission: RE | Admit: 2023-05-09 | Discharge: 2023-05-09 | Disposition: A | Discharge status: Home / Self Care | Payer: Medicare Other | Source: Ambulatory Visit | Attending: Physician Assistant | Admitting: Physician Assistant

## 2023-05-09 ENCOUNTER — Encounter (HOSPITAL_COMMUNITY): Payer: Self-pay

## 2023-05-09 VITALS — BP 110/60 | HR 73 | Wt 189.2 lb

## 2023-05-09 DIAGNOSIS — I5020 Unspecified systolic (congestive) heart failure: Secondary | ICD-10-CM

## 2023-05-09 DIAGNOSIS — I255 Ischemic cardiomyopathy: Secondary | ICD-10-CM | POA: Insufficient documentation

## 2023-05-09 DIAGNOSIS — I2510 Atherosclerotic heart disease of native coronary artery without angina pectoris: Secondary | ICD-10-CM | POA: Diagnosis not present

## 2023-05-09 DIAGNOSIS — J9612 Chronic respiratory failure with hypercapnia: Secondary | ICD-10-CM | POA: Insufficient documentation

## 2023-05-09 DIAGNOSIS — I130 Hypertensive heart and chronic kidney disease with heart failure and stage 1 through stage 4 chronic kidney disease, or unspecified chronic kidney disease: Secondary | ICD-10-CM | POA: Insufficient documentation

## 2023-05-09 DIAGNOSIS — Z7951 Long term (current) use of inhaled steroids: Secondary | ICD-10-CM | POA: Insufficient documentation

## 2023-05-09 DIAGNOSIS — Z7984 Long term (current) use of oral hypoglycemic drugs: Secondary | ICD-10-CM | POA: Diagnosis not present

## 2023-05-09 DIAGNOSIS — I5022 Chronic systolic (congestive) heart failure: Secondary | ICD-10-CM | POA: Diagnosis not present

## 2023-05-09 DIAGNOSIS — Z79899 Other long term (current) drug therapy: Secondary | ICD-10-CM | POA: Insufficient documentation

## 2023-05-09 DIAGNOSIS — N1832 Chronic kidney disease, stage 3b: Secondary | ICD-10-CM | POA: Insufficient documentation

## 2023-05-09 DIAGNOSIS — I252 Old myocardial infarction: Secondary | ICD-10-CM | POA: Diagnosis not present

## 2023-05-09 DIAGNOSIS — Z7982 Long term (current) use of aspirin: Secondary | ICD-10-CM | POA: Diagnosis not present

## 2023-05-09 DIAGNOSIS — E1122 Type 2 diabetes mellitus with diabetic chronic kidney disease: Secondary | ICD-10-CM | POA: Diagnosis not present

## 2023-05-09 DIAGNOSIS — Z9981 Dependence on supplemental oxygen: Secondary | ICD-10-CM | POA: Insufficient documentation

## 2023-05-09 DIAGNOSIS — J449 Chronic obstructive pulmonary disease, unspecified: Secondary | ICD-10-CM | POA: Diagnosis not present

## 2023-05-09 DIAGNOSIS — Z87891 Personal history of nicotine dependence: Secondary | ICD-10-CM | POA: Insufficient documentation

## 2023-05-09 DIAGNOSIS — E785 Hyperlipidemia, unspecified: Secondary | ICD-10-CM | POA: Diagnosis not present

## 2023-05-09 LAB — COMPREHENSIVE METABOLIC PANEL
ALT: 17 U/L (ref 0–44)
AST: 20 U/L (ref 15–41)
Albumin: 3.3 g/dL — ABNORMAL LOW (ref 3.5–5.0)
Alkaline Phosphatase: 81 U/L (ref 38–126)
Anion gap: 12 (ref 5–15)
BUN: 16 mg/dL (ref 8–23)
CO2: 35 mmol/L — ABNORMAL HIGH (ref 22–32)
Calcium: 9.2 mg/dL (ref 8.9–10.3)
Chloride: 92 mmol/L — ABNORMAL LOW (ref 98–111)
Creatinine, Ser: 1.31 mg/dL — ABNORMAL HIGH (ref 0.44–1.00)
GFR, Estimated: 42 mL/min — ABNORMAL LOW (ref >60)
Glucose, Bld: 125 mg/dL — ABNORMAL HIGH (ref 70–99)
Potassium: 5.2 mmol/L — ABNORMAL HIGH (ref 3.5–5.1)
Sodium: 139 mmol/L (ref 135–145)
Total Bilirubin: <0.1 mg/dL — ABNORMAL LOW (ref 0.3–1.2)
Total Protein: 6.6 g/dL (ref 6.5–8.1)

## 2023-05-09 LAB — BRAIN NATRIURETIC PEPTIDE: B Natriuretic Peptide: 415.5 pg/mL — ABNORMAL HIGH (ref 0.0–100.0)

## 2023-05-09 MED ORDER — SPIRONOLACTONE 25 MG PO TABS: 12.5000 mg | ORAL_TABLET | Freq: Every day | ORAL | 3 refills | Status: DC

## 2023-05-09 MED ORDER — LOSARTAN POTASSIUM 25 MG PO TABS: 12.5000 mg | ORAL_TABLET | Freq: Every day | ORAL | 3 refills | Status: DC

## 2023-05-09 NOTE — Patient Instructions (Signed)
START Spironolactone 12.5 mg ( 1/2 Tab) daily.  Labs done today, your results will be available in MyChart, we will contact you for abnormal readings.  Repeat blood work in a week.  Thank you for allowing [place] to provider your heart failure care after your recent hospitalization. Please follow-up in 2 weeks.  If you have any questions, issues, or concerns before your next appointment please call our office at [phone], opt. 2 and leave a message for the triage nurse.

## 2023-05-10 ENCOUNTER — Ambulatory Visit: Payer: Medicare Other | Admitting: Family Medicine

## 2023-05-10 DIAGNOSIS — E1169 Type 2 diabetes mellitus with other specified complication: Secondary | ICD-10-CM

## 2023-05-10 DIAGNOSIS — N1831 Chronic kidney disease, stage 3a: Secondary | ICD-10-CM

## 2023-05-10 NOTE — Addendum Note (Signed)
Encounter addended by: Andrey Farmer, PA-C on: 05/10/2023 2:01 PM  Actions taken: Order Reconciliation Section accessed, Clinical Note Signed

## 2023-05-11 ENCOUNTER — Telehealth (HOSPITAL_COMMUNITY): Payer: Self-pay

## 2023-05-11 NOTE — Telephone Encounter (Signed)
Spoke with patient regarding the following results. Patient made aware and patient verbalized understanding.   Patient has started the spironolactone but will hold going forward- will come back for labs next week as scheduled.   Educated patient on potassium rich foods, and things to avoid, patient verbalized understanding.   Will forward to make provider aware.

## 2023-05-11 NOTE — Telephone Encounter (Signed)
-----   Message from [facility] [place] N sent at 05/09/2023  5:55 PM EDT ----- Kidney function stable but K is elevated. She should not start the spironolactone that was ordered at today's visit. Limit potassium in diet. Hasn't had high K before, if K is okay on repeat labs next week we can plan to start the spiro then

## 2023-05-12 ENCOUNTER — Ambulatory Visit: Payer: Medicare Other | Admitting: Family Medicine

## 2023-05-12 ENCOUNTER — Encounter: Payer: Self-pay | Admitting: Family Medicine

## 2023-05-12 VITALS — BP 120/70 | HR 85 | Resp 16 | Ht 61.0 in | Wt 187.5 lb

## 2023-05-12 DIAGNOSIS — J9611 Chronic respiratory failure with hypoxia: Secondary | ICD-10-CM | POA: Diagnosis not present

## 2023-05-12 DIAGNOSIS — I10 Essential (primary) hypertension: Secondary | ICD-10-CM

## 2023-05-12 DIAGNOSIS — E875 Hyperkalemia: Secondary | ICD-10-CM | POA: Diagnosis not present

## 2023-05-12 DIAGNOSIS — Z7984 Long term (current) use of oral hypoglycemic drugs: Secondary | ICD-10-CM | POA: Diagnosis not present

## 2023-05-12 DIAGNOSIS — R5381 Other malaise: Secondary | ICD-10-CM

## 2023-05-12 DIAGNOSIS — E1169 Type 2 diabetes mellitus with other specified complication: Secondary | ICD-10-CM

## 2023-05-12 DIAGNOSIS — J9612 Chronic respiratory failure with hypercapnia: Secondary | ICD-10-CM | POA: Diagnosis not present

## 2023-05-12 DIAGNOSIS — I5020 Unspecified systolic (congestive) heart failure: Secondary | ICD-10-CM | POA: Diagnosis not present

## 2023-05-12 DIAGNOSIS — N1832 Chronic kidney disease, stage 3b: Secondary | ICD-10-CM

## 2023-05-12 DIAGNOSIS — F419 Anxiety disorder, unspecified: Secondary | ICD-10-CM

## 2023-05-12 DIAGNOSIS — J449 Chronic obstructive pulmonary disease, unspecified: Secondary | ICD-10-CM

## 2023-05-12 LAB — POCT GLYCOSYLATED HEMOGLOBIN (HGB A1C): HbA1c, POC (controlled diabetic range): 6.8 % (ref 0.0–7.0)

## 2023-05-12 MED ORDER — EMPAGLIFLOZIN 10 MG PO TABS
10.0000 mg | ORAL_TABLET | Freq: Every day | ORAL | 1 refills | Status: DC
Start: 2023-05-12 — End: 2023-11-06

## 2023-05-12 NOTE — Patient Instructions (Addendum)
A few things to remember from today's visit:  Type 2 diabetes mellitus with other specified complication, without long-term current use of insulin (HCC) - Plan: POC HgB A1c, empagliflozin (JARDIANCE) 10 MG TABS tablet  Hyperkalemia  Physical deconditioning  HFrEF (heart failure with reduced ejection fraction) (HCC) - Plan: empagliflozin (JARDIANCE) 10 MG TABS tablet  Jardiance started today, samples given today, take 1/2 tab until gone then 1 tab of Jardiance 10 mg daily.  If you need refills for medications you take chronically, please call your pharmacy. Do not use My Chart to request refills or for acute issues that need immediate attention. If you send a my chart message, it may take a few days to be addressed, specially if I am not in the office.  Please be sure medication list is accurate. If a new problem present, please set up appointment sooner than planned today.

## 2023-05-12 NOTE — Progress Notes (Signed)
HPI: [name] is a 77 y.o. female with a PMHx significant for DM II,CAD, COPD, CKD III, and seasonal allergies, who is here today with her grandson for chronic disease management.  Last seen on 03/15/2022 She was hospitalized for a COPD and CHF exacerbation from 9/14-9/20/2024.  Chronic respiratory failure: She has been on oxygen since 12/2021. States that she has decreased O2 supplementation since hospital discharge and now at her baseline with 2 LPM continuously. If she takes it off for more than an hour, her O2 sat drops to around 70%. Per records reviewed she declined BiPaP.  She is on Dulera 200-5 mcg 2 puff daily.  She is requesting referral to outpt PT. States that since hospital discharge she cannot walk long distances, gets tired easily and feels more unbalance. She did som PT at home. She is using a walker for transfer.  -Diabetes Mellitus II:Dx'ed  in 03/2022 with HgA1C of 9.0.  - Checking BG at home: She is checking at home. She says the highest her readings have been is approximately 200. She has some readings as low as the 70s when she is drowsy. Medications: She is currently taking metformin 500 bid and glipizide 5 mg daily.  She has not had an eye exam for some time.   Negative for symptoms of hypoglycemia, polyuria, polydipsia, foot ulcers/trauma  Last HgA1C was 6.1 in 12/2021.  Lab Results  Component Value Date   MICROALBUR 1.7 09/17/2021   -Anxiety: She has been taking Lexapro 20 mg daily for 2-3 years. She believes it is helping with her mood.   -Hypertension: She is currently taking losartan 12.5 mg daily.  She was taking spironolactone 12.5 mg daily, but was instructed to stop it because elevated potassium. She would like a list of foods she needs to avoid.  Negative for unusual or severe headache, visual changes, exertional chest pain,  focal weakness, or worsening edema. Echo 04/09/23: LVEF 25-30% and grade II diastolic dysfunction. Negative for  orthopnea or PND. She is not longer on Furosemide 40 mg.  -CKD III: She has not noted gross hematuria,foam in urine,or decreased urine output.  Lab Results  Component Value Date   NA 139 05/09/2023   CL 92 (L) 05/09/2023   K 5.2 (H) 05/09/2023   CO2 35 (H) 05/09/2023   BUN 16 05/09/2023   CREATININE 1.31 (H) 05/09/2023   GFRNONAA 42 (L) 05/09/2023   CALCIUM 9.2 05/09/2023   PHOS 3.1 04/09/2023   ALBUMIN 3.3 (L) 05/09/2023   GLUCOSE 125 (H) 05/09/2023   Review of Systems  Constitutional:  Positive for activity change and fatigue. Negative for appetite change, chills and fever.  HENT:  Negative for mouth sores, sore throat and trouble swallowing.   Respiratory:  Negative for cough and wheezing.   Gastrointestinal:  Negative for abdominal pain, nausea and vomiting.  Genitourinary:  Negative for dysuria.  Musculoskeletal:  Positive for arthralgias and gait problem.  Skin:  Negative for rash.  Neurological:  Negative for syncope and facial asymmetry.  Psychiatric/Behavioral:  Negative for confusion and hallucinations.   See other pertinent positives and negatives in HPI.  Current Outpatient Medications on File Prior to Visit  Medication Sig Dispense Refill   ACCU-CHEK GUIDE test strip USE TO CHECK BLOOD SUGARS 4 TIMES DAILY 100 strip 1   Accu-Chek Softclix Lancets lancets Use to check blood sugars 1-2 times daily. Dx:e11.9 100 each 12   albuterol (VENTOLIN HFA) 108 (90 Base) MCG/ACT inhaler Inhale 1 puff every  4 hours as needed for wheezing or shortness of breath. 18 each 2   aspirin EC 81 MG EC tablet Take 1 tablet (81 mg total) by mouth daily.     atorvastatin (LIPITOR) 80 MG tablet TAKE 1 TABLET BY MOUTH EVERY DAY AT 6PM 90 tablet 0   Blood Glucose Monitoring Suppl (ACCU-CHEK AVIVA PLUS) w/Device KIT Use to test blood sugars 1-2 times daily. Dx: E11.9 1 kit 0   cetirizine (ZYRTEC) 10 MG tablet Take 10 mg by mouth daily.     escitalopram (LEXAPRO) 20 MG tablet TAKE 1 TABLET BY  MOUTH EVERY DAY 90 tablet 0   ezetimibe (ZETIA) 10 MG tablet TAKE 1 TABLET BY MOUTH EVERY DAY 30 tablet 0   Famotidine (PEPCID PO) Take 1 tablet by mouth daily.     ferrous gluconate (FERGON) 324 MG tablet TAKE 1 TABLET BY MOUTH TWICE A DAY WITH MEALS 180 tablet 1   furosemide (LASIX) 40 MG tablet Take 1 tablet (40 mg total) by mouth daily as needed. Take one tablet of lasix if your weight increases by 2 pounds in 24 hours 90 tablet 1   guaiFENesin (MUCINEX) 600 MG 12 hr tablet Take 1 tablet (600 mg total) by mouth 2 (two) times daily. 30 tablet 0   losartan (COZAAR) 25 MG tablet Take 0.5 tablets (12.5 mg total) by mouth daily. 45 tablet 3   metFORMIN (GLUCOPHAGE) 500 MG tablet TAKE 1 TABLET BY MOUTH 2 TIMES DAILY WITH A MEAL. 180 tablet 1   mometasone-formoterol (DULERA) 200-5 MCG/ACT AERO Inhale 2 puffs into the lungs daily. 13 g 2   montelukast (SINGULAIR) 10 MG tablet Take 1 tablet (10 mg total) by mouth at bedtime. 30 tablet 3   nebivolol (BYSTOLIC) 5 MG tablet TAKE 1 TABLET BY MOUTH EVERY DAY 90 tablet 1   nitroGLYCERIN (NITROSTAT) 0.4 MG SL tablet Place 1 tablet (0.4 mg total) under the tongue every 5 (five) minutes as needed for chest pain. 25 tablet 0   spironolactone (ALDACTONE) 25 MG tablet Take 0.5 tablets (12.5 mg total) by mouth daily. 45 tablet 3   triamcinolone (NASACORT ALLERGY 24HR) 55 MCG/ACT AERO nasal inhaler Place 2 sprays into the nose daily.     vitamin B-12 1000 MCG tablet Take 1 tablet (1,000 mcg total) by mouth daily.     No current facility-administered medications on file prior to visit.    Past Medical History:  Diagnosis Date   Allergy    Asthma    CAD (coronary artery disease)    COPD (chronic obstructive pulmonary disease) (HCC)    Depression    GERD (gastroesophageal reflux disease)    Hyperlipidemia    Hypertension    Myocardial infarction (HCC) 10/16/2013   No Known Allergies  Social History   Socioeconomic History   Marital status: Widowed     Spouse name: Not on file   Number of children: 1   Years of education: Not on file   Highest education level: High school graduate  Occupational History   Occupation: retired  Tobacco Use   Smoking status: Former    Current packs/day: 0.00    Average packs/day: 1 pack/day for 45.0 years (45.0 ttl pk-yrs)    Types: Cigarettes    Start date: 10/16/1968    Quit date: 10/16/2013    Years since quitting: 9.5   Smokeless tobacco: Never  Vaping Use   Vaping status: Never Used  Substance and Sexual Activity   Alcohol use: Not Currently  Comment: occasional   Drug use: No   Sexual activity: Not Currently  Other Topics Concern   Not on file  Social History Narrative   Not on file   Social Determinants of Health   Financial Resource Strain: Low Risk  (04/12/2023)   Overall Financial Resource Strain (CARDIA)    Difficulty of Paying Living Expenses: Not hard at all  Food Insecurity: No Food Insecurity (04/09/2023)   Hunger Vital Sign    Worried About Running Out of Food in the Last Year: Never true    Ran Out of Food in the Last Year: Never true  Transportation Needs: No Transportation Needs (04/12/2023)   PRAPARE - Administrator, Civil Service (Medical): No    Lack of Transportation (Non-Medical): No  Physical Activity: Inactive (05/02/2022)   Exercise Vital Sign    Days of Exercise per Week: 0 days    Minutes of Exercise per Session: 0 min  Stress: No Stress Concern Present (05/02/2022)   [employer] of Occupational Health - Occupational Stress Questionnaire    Feeling of Stress : Not at all  Social Connections: Socially Isolated (04/29/2021)   Social Connection and Isolation Panel [NHANES]    Frequency of Communication with Friends and Family: Twice a week    Frequency of Social Gatherings with Friends and Family: Twice a week    Attends Religious Services: Never    Database administrator or Organizations: No    Attends Banker Meetings: Never     Marital Status: Widowed    Vitals:   05/12/23 1418  BP: 120/70  Pulse: 85  Resp: 16  SpO2: 92%   Body mass index is 35.43 kg/m.  Physical Exam Vitals and nursing note reviewed.  Constitutional:      General: She is not in acute distress.    Appearance: She is well-developed.  HENT:     Head: Normocephalic and atraumatic.     Mouth/Throat:     Mouth: Mucous membranes are moist.     Pharynx: Oropharynx is clear.  Eyes:     Conjunctiva/sclera: Conjunctivae normal.  Cardiovascular:     Rate and Rhythm: Normal rate and regular rhythm.     Heart sounds: No murmur heard.    Comments: DP pulses palpable. Pulmonary:     Effort: Pulmonary effort is normal. No respiratory distress.     Breath sounds: Normal breath sounds.  Abdominal:     Palpations: Abdomen is soft. There is no mass.     Tenderness: There is no abdominal tenderness.  Musculoskeletal:     Right lower leg: 1+ Pitting Edema present.     Left lower leg: 1+ Pitting Edema present.  Lymphadenopathy:     Cervical: No cervical adenopathy.  Skin:    General: Skin is warm.     Findings: No erythema or rash.  Neurological:     General: No focal deficit present.     Mental Status: She is alert and oriented to person, place, and time.     Cranial Nerves: No cranial nerve deficit.     Gait: Gait normal.     Comments: Unstable gait assisted with a walker.  Psychiatric:        Mood and Affect: Mood and affect normal.    ASSESSMENT AND PLAN:  Ms. Bartolucci was seen today for chronic follow up.   Orders Placed This Encounter  Procedures   POC HgB A1c   Lab Results  Component Value Date  HGBA1C 6.8 05/12/2023   Type 2 diabetes mellitus with other specified complication, without long-term current use of insulin (HCC) Assessment & Plan: HgA1C swent from 6.1 to 6.8. Jardiance added today (last eGFR 42), samples of 25 mg to take 1/2 given and Rx for 10 mg daily sent. Glipizide discontinued.. No changes in Metformin  dose. Overdue for eye exam. Continue appropriate foot care. F/U in 3-4 months.  Orders: -     POCT glycosylated hemoglobin (Hb A1C) -     Empagliflozin; Take 1 tablet (10 mg total) by mouth daily.  Dispense: 90 tablet; Refill: 1  Hyperkalemia Having labs on 05/16/23. Low K+ diet recommended, list of foods given.  Physical deconditioning For now I do not recommend outpt PT until her LVEF improves, she can discuss with her cardiologist the possibility for cardiac rehab.  She is not interested in [facility] through [facility].  HFrEF (heart failure with reduced ejection fraction) (HCC) Assessment & Plan: Echo 04/09/23: LVEF 25-30% and grade II diastolic dysfunction. She is on Losartan 25 mg 1/2 tab daily. Spironolactone was held due to hyperK+. Jardiance added today, samples of 25 mg to take 1/2 tab daily given, then she will continue with prescription 10 mg daily. We discussed some side effects, including hypotension. She has a follow up appt with cardiologist 05/23/23.  Orders: -     Empagliflozin; Take 1 tablet (10 mg total) by mouth daily.  Dispense: 90 tablet; Refill: 1  Stage 3b chronic kidney disease (HCC) Assessment & Plan: Problem has been stable. Last e GFR 42 and Cr 1.3 in 02/2022 it was 54. Jardiance added today. Low-salt diet, adequate hydration,good glucose and BP control, as well as NSAID's avoidance recommended.  Essential hypertension Assessment & Plan: BP adequately controlled. Continue Bystolic 5 mg daily and Losartan 25 mg 1/2 tab daily. Monitor BP at home. Low-salt/DASH diet recommended.  Anxiety disorder, unspecified type Assessment & Plan: Reporting problem as stable. Continue Lexapro 20 mg daily.  Chronic respiratory failure with hypoxia and hypercapnia (HCC) Assessment & Plan: Worse after recent hospitalization, getting closer to her baseline. Continue O2 supplementation 2 LPM.   COPD  GOLD II based on fev1/VC of 58%  Assessment & Plan: Problem is otherwise  stable. Continue Dulera 200-5 mcg 2 puff twice daily and albuterol inhaler 2 puff every 4-6 hours as needed. She is not longer on Spiriva. If another exacerbation, will refer to pulmonologist.  I spent a total of 49 minutes in both face to face and non face to face activities for this visit on the date of this encounter. During this time history was obtained and documented, examination was performed, prior labs/imaging reviewed, and assessment/plan discussed.  Return in about 6 months (around 11/10/2023) for chronic problems.  I, Rolla Etienne Wierda, acting as a scribe for Lavon Bothwell [place], MD., have documented all relevant documentation on the behalf of Oaklyn Jakubek [place], MD, as directed by  Ziad Maye [place], MD while in the presence of Nashanti Duquette [place], MD.   I, Ralyn Stlaurent [place], MD, have reviewed all documentation for this visit. The documentation on 05/13/23 for the exam, diagnosis, procedures, and orders are all accurate and complete.  Lillyn Wieczorek G. [place], MD  [facility]. [place] office.

## 2023-05-12 NOTE — Assessment & Plan Note (Signed)
BP adequately controlled. Continue Bystolic 5 mg daily and Losartan 25 mg 1/2 tab daily. Monitor BP at home. Low-salt/DASH diet recommended.

## 2023-05-12 NOTE — Assessment & Plan Note (Signed)
Jardiance 10 mg daily added today. Glipizide d/c. No changes in Metformin.

## 2023-05-12 NOTE — Assessment & Plan Note (Signed)
Problem has been stable. Last e GFR 42 and Cr 1.3. Jardiance added today. Low-salt diet, adequate hydration,good glucose and BP control, as well as NSAID's avoidance recommended.

## 2023-05-12 NOTE — Assessment & Plan Note (Signed)
Reporting problem as well controlled. Continue Lexapro 20 mg daily.

## 2023-05-13 NOTE — Assessment & Plan Note (Signed)
Worse after recent hospitalization, getting closer to her baseline. Continue O2 supplementation 2 LPM.

## 2023-05-13 NOTE — Assessment & Plan Note (Addendum)
Problem is otherwise stable. Continue Dulera 200-5 mcg 2 puff twice daily and albuterol inhaler 2 puff every 4-6 hours as needed. She is not longer on Spiriva. If another exacerbation, will refer to pulmonologist.

## 2023-05-13 NOTE — Assessment & Plan Note (Addendum)
Echo 04/09/23: LVEF 25-30% and grade II diastolic dysfunction. She is on Losartan 25 mg 1/2 tab daily. Spironolactone was held due to hyperK+. Jardiance added today, samples of 25 mg to take 1/2 tab daily given, then she will continue with prescription 10 mg daily. We discussed some side effects, including hypotension. She has a follow up appt with cardiologist 05/23/23.

## 2023-05-16 ENCOUNTER — Ambulatory Visit (HOSPITAL_COMMUNITY)
Admission: RE | Admit: 2023-05-16 | Discharge: 2023-05-16 | Disposition: A | Discharge status: Home / Self Care | Payer: Medicare Other | Source: Ambulatory Visit | Attending: Cardiology | Admitting: Cardiology

## 2023-05-16 DIAGNOSIS — I255 Ischemic cardiomyopathy: Secondary | ICD-10-CM | POA: Insufficient documentation

## 2023-05-16 LAB — BASIC METABOLIC PANEL
Anion gap: 8 (ref 5–15)
BUN: 20 mg/dL (ref 8–23)
CO2: 36 mmol/L — ABNORMAL HIGH (ref 22–32)
Calcium: 9.2 mg/dL (ref 8.9–10.3)
Chloride: 97 mmol/L — ABNORMAL LOW (ref 98–111)
Creatinine, Ser: 1.2 mg/dL — ABNORMAL HIGH (ref 0.44–1.00)
GFR, Estimated: 47 mL/min — ABNORMAL LOW (ref >60)
Glucose, Bld: 152 mg/dL — ABNORMAL HIGH (ref 70–99)
Potassium: 4.7 mmol/L (ref 3.5–5.1)
Sodium: 141 mmol/L (ref 135–145)

## 2023-05-23 ENCOUNTER — Ambulatory Visit (HOSPITAL_COMMUNITY): Payer: Medicare Other

## 2023-05-24 ENCOUNTER — Other Ambulatory Visit: Payer: Self-pay | Admitting: Family Medicine

## 2023-06-05 ENCOUNTER — Ambulatory Visit (HOSPITAL_COMMUNITY)
Admission: RE | Admit: 2023-06-05 | Discharge: 2023-06-05 | Disposition: A | Discharge status: Home / Self Care | Payer: Medicare Other | Source: Ambulatory Visit | Attending: Adult Health | Admitting: Adult Health

## 2023-06-05 VITALS — BP 138/50 | HR 79 | Wt 184.4 lb

## 2023-06-05 DIAGNOSIS — N1831 Chronic kidney disease, stage 3a: Secondary | ICD-10-CM

## 2023-06-05 DIAGNOSIS — Z7984 Long term (current) use of oral hypoglycemic drugs: Secondary | ICD-10-CM | POA: Diagnosis not present

## 2023-06-05 DIAGNOSIS — I5022 Chronic systolic (congestive) heart failure: Secondary | ICD-10-CM | POA: Diagnosis not present

## 2023-06-05 DIAGNOSIS — J9612 Chronic respiratory failure with hypercapnia: Secondary | ICD-10-CM | POA: Diagnosis not present

## 2023-06-05 DIAGNOSIS — Z9981 Dependence on supplemental oxygen: Secondary | ICD-10-CM | POA: Insufficient documentation

## 2023-06-05 DIAGNOSIS — J441 Chronic obstructive pulmonary disease with (acute) exacerbation: Secondary | ICD-10-CM | POA: Insufficient documentation

## 2023-06-05 DIAGNOSIS — E1122 Type 2 diabetes mellitus with diabetic chronic kidney disease: Secondary | ICD-10-CM | POA: Insufficient documentation

## 2023-06-05 DIAGNOSIS — I219 Acute myocardial infarction, unspecified: Secondary | ICD-10-CM | POA: Diagnosis not present

## 2023-06-05 DIAGNOSIS — J9611 Chronic respiratory failure with hypoxia: Secondary | ICD-10-CM | POA: Diagnosis not present

## 2023-06-05 DIAGNOSIS — I2510 Atherosclerotic heart disease of native coronary artery without angina pectoris: Secondary | ICD-10-CM | POA: Diagnosis not present

## 2023-06-05 DIAGNOSIS — Z79899 Other long term (current) drug therapy: Secondary | ICD-10-CM | POA: Diagnosis not present

## 2023-06-05 DIAGNOSIS — Z7982 Long term (current) use of aspirin: Secondary | ICD-10-CM | POA: Insufficient documentation

## 2023-06-05 DIAGNOSIS — I5020 Unspecified systolic (congestive) heart failure: Secondary | ICD-10-CM

## 2023-06-05 DIAGNOSIS — I083 Combined rheumatic disorders of mitral, aortic and tricuspid valves: Secondary | ICD-10-CM | POA: Diagnosis not present

## 2023-06-05 DIAGNOSIS — N1832 Chronic kidney disease, stage 3b: Secondary | ICD-10-CM | POA: Diagnosis not present

## 2023-06-05 DIAGNOSIS — E785 Hyperlipidemia, unspecified: Secondary | ICD-10-CM | POA: Insufficient documentation

## 2023-06-05 DIAGNOSIS — I130 Hypertensive heart and chronic kidney disease with heart failure and stage 1 through stage 4 chronic kidney disease, or unspecified chronic kidney disease: Secondary | ICD-10-CM | POA: Insufficient documentation

## 2023-06-05 DIAGNOSIS — I255 Ischemic cardiomyopathy: Secondary | ICD-10-CM | POA: Diagnosis not present

## 2023-06-05 LAB — BASIC METABOLIC PANEL
Anion gap: 10 (ref 5–15)
BUN: 22 mg/dL (ref 8–23)
CO2: 32 mmol/L (ref 22–32)
Calcium: 9.4 mg/dL (ref 8.9–10.3)
Chloride: 99 mmol/L (ref 98–111)
Creatinine, Ser: 1.37 mg/dL — ABNORMAL HIGH (ref 0.44–1.00)
GFR, Estimated: 40 mL/min — ABNORMAL LOW (ref >60)
Glucose, Bld: 203 mg/dL — ABNORMAL HIGH (ref 70–99)
Potassium: 4.4 mmol/L (ref 3.5–5.1)
Sodium: 141 mmol/L (ref 135–145)

## 2023-06-05 NOTE — Patient Instructions (Signed)
Great to see you today!!!  Continue current medications  Labs done today, your results will be available in MyChart, we will contact you for abnormal readings.  Thank you for allowing [place] to provider your heart failure care after your recent hospitalization. Please follow-up with [facility], they will call you for an appointment  If you have any questions, issues, or concerns before your next appointment please call our office at [phone], opt. 2 and leave a message for the triage nurse.

## 2023-06-05 NOTE — Progress Notes (Signed)
HEART IMPACT TRANSITIONS OF CARE    PCP: Dr Wheeler Primary Cardiologist: Dr Cristal Deer   HPI: Beverly Wheeler is a 77 y.o. female with history of CAD w/ prior MI recommended for CABG in 2015, HFrEF with previously recovered EF, HTN, HLD, COPD, CKD IIIb, DM II, chronic respiratory failure on home O2 prior tobacco use.    Cardiac history dates back to 2015 when she was admitted with late presenting ST elevation MI. Cardiac cath at that time demonstrated multivessel CAD. CAD not felt to be amenable to PCI, bypass surgery recommended. She declined surgery d/t fear of peri-op pulmonary complications.  EF was 30-35%, later improved to 55-60% in 02/2014. Echo 2019: EF down to 35-40% in setting of septic/hypovolemic shock.   She has not followed with Cardiology since 2015.    She was admitted in 09/24 with acute on chronic hypercarbic respiratory failure 2/2 acute on chronic CHF and AECOPD. She was treated with nebs, steroids and empiric antibiotics. Echo with EF down to 25-30%, thin and hyperechoic apical myocardium, RWMA, RV okay, moderate MR, mild mitral stenosis with mean gradient 5.5 mmHg, mild AS with mean gradient of 12 mmhg. Cardiology consulted. She was diuresed and GDMT titrated.    Initally HF TOC visit was 05/09/23. Mild volume overload. Instructed to take lasix 40 mg daily x2 day the prn .   She returns for TOC f/u.  Last weekend she had fatigue/nausea/fever. No fever over the last 24 hours. Starting to feel a little better. Gets SOB with exertion but her this is her baseline. She is on 2 liters [place]. She is asking about PT and she is open to home health.  Denies PND/Orthopnea. Appetite ok. No fever or chills. Weight at home 181-185 pounds. Taking all medications. Her son is living with her and helps her ADLs/food.    03/2023 Echo   1. There is no left ventricular thrombus (Definity contrast was used).  The apical myocardium is thin and hyperechoic, consistent with scar. Left  ventricular  ejection fraction, by estimation, is 25 to 30%. The left  ventricle has severely decreased  function. The left ventricle demonstrates global hypokinesis. The left  ventricular internal cavity size was moderately dilated. Left ventricular  diastolic parameters are consistent with Grade II diastolic dysfunction  (pseudonormalization). Elevated left  atrial pressure. There is akinesis of the left ventricular, mid-apical  anteroseptal wall, anterior wall and apical segment. There is severe  hypokinesis of the left ventricular, entire inferior wall and  inferolateral wall.   2. Right ventricular systolic function is normal. The right ventricular  size is normal. Tricuspid regurgitation signal is inadequate for assessing  PA pressure.   3. Left atrial size was moderately dilated.   4. Right atrial size was mildly dilated.   5. The mitral valve is possibly rheumatic. Mild to moderate mitral valve  regurgitation. Mild mitral stenosis. The mean mitral valve gradient is 5.5  mmHg with average heart rate of 77 bpm.   6. The aortic valve is tricuspid. Aortic valve regurgitation is not  visualized. Mild aortic valve stenosis. Aortic valve area, by VTI measures  1.56 cm. Aortic valve mean gradient measures 12.0 mmHg. Aortic valve Vmax  measures 2.10 m/s.   ROS: All systems negative except as listed in HPI, PMH and Problem List.  SH:  Social History   Socioeconomic History   Marital status: Widowed    Spouse name: Not on file   Number of children: 1   Years of education:  Not on file   Highest education level: High school graduate  Occupational History   Occupation: retired  Tobacco Use   Smoking status: Former    Current packs/day: 0.00    Average packs/day: 1 pack/day for 45.0 years (45.0 ttl pk-yrs)    Types: Cigarettes    Start date: 10/16/1968    Quit date: 10/16/2013    Years since quitting: 9.6   Smokeless tobacco: Never  Vaping Use   Vaping status: Never Used  Substance and  Sexual Activity   Alcohol use: Not Currently    Comment: occasional   Drug use: No   Sexual activity: Not Currently  Other Topics Concern   Not on file  Social History Narrative   Not on file   Social Determinants of Health   Financial Resource Strain: Low Risk  (04/12/2023)   Overall Financial Resource Strain (CARDIA)    Difficulty of Paying Living Expenses: Not hard at all  Food Insecurity: No Food Insecurity (04/09/2023)   Hunger Vital Sign    Worried About Running Out of Food in the Last Year: Never true    Ran Out of Food in the Last Year: Never true  Transportation Needs: No Transportation Needs (04/12/2023)   PRAPARE - Administrator, Civil Service (Medical): No    Lack of Transportation (Non-Medical): No  Physical Activity: Inactive (05/02/2022)   Exercise Vital Sign    Days of Exercise per Week: 0 days    Minutes of Exercise per Session: 0 min  Stress: No Stress Concern Present (05/02/2022)   [employer] of Occupational Health - Occupational Stress Questionnaire    Feeling of Stress : Not at all  Social Connections: Socially Isolated (04/29/2021)   Social Connection and Isolation Panel [NHANES]    Frequency of Communication with Friends and Family: Twice a week    Frequency of Social Gatherings with Friends and Family: Twice a week    Attends Religious Services: Never    Database administrator or Organizations: No    Attends Banker Meetings: Never    Marital Status: Widowed  Intimate Partner Violence: Not At Risk (04/09/2023)   Humiliation, Afraid, Rape, and Kick questionnaire    Fear of Current or Ex-Partner: No    Emotionally Abused: No    Physically Abused: No    Sexually Abused: No    FH:  Family History  Problem Relation Age of Onset   Allergies Mother    Breast cancer Mother    Arthritis Mother    Cancer Mother    Hyperlipidemia Mother    Hypertension Mother    Heart disease Father    Heart disease Brother    Arthritis  Brother    Cancer Brother    Depression Brother    Drug abuse Brother    Hyperlipidemia Brother    Heart attack Brother    Rheum arthritis Brother    Cancer Sister    Dementia Sister     Past Medical History:  Diagnosis Date   Allergy    Asthma    CAD (coronary artery disease)    COPD (chronic obstructive pulmonary disease) (HCC)    Depression    GERD (gastroesophageal reflux disease)    Hyperlipidemia    Hypertension    Myocardial infarction (HCC) 10/16/2013    Current Outpatient Medications  Medication Sig Dispense Refill   ACCU-CHEK GUIDE test strip USE TO CHECK BLOOD SUGARS 4 TIMES DAILY 100 strip 1   Accu-Chek Softclix  Lancets lancets Use to check blood sugars 1-2 times daily. Dx:e11.9 100 each 12   albuterol (VENTOLIN HFA) 108 (90 Base) MCG/ACT inhaler Inhale 1 puff every 4 hours as needed for wheezing or shortness of breath. 18 each 2   aspirin EC 81 MG EC tablet Take 1 tablet (81 mg total) by mouth daily.     atorvastatin (LIPITOR) 80 MG tablet TAKE 1 TABLET BY MOUTH EVERY DAY AT 6PM 90 tablet 0   Blood Glucose Monitoring Suppl (ACCU-CHEK AVIVA PLUS) w/Device KIT Use to test blood sugars 1-2 times daily. Dx: E11.9 1 kit 0   cetirizine (ZYRTEC) 10 MG tablet Take 10 mg by mouth daily.     empagliflozin (JARDIANCE) 10 MG TABS tablet Take 1 tablet (10 mg total) by mouth daily. 90 tablet 1   escitalopram (LEXAPRO) 20 MG tablet TAKE 1 TABLET BY MOUTH EVERY DAY 90 tablet 0   ezetimibe (ZETIA) 10 MG tablet TAKE 1 TABLET BY MOUTH EVERY DAY 30 tablet 0   Famotidine (PEPCID PO) Take 1 tablet by mouth daily.     ferrous gluconate (FERGON) 324 MG tablet TAKE 1 TABLET BY MOUTH TWICE A DAY WITH MEALS 180 tablet 1   furosemide (LASIX) 40 MG tablet TAKE 1 TABLET BY MOUTH EVERY DAY 90 tablet 0   guaiFENesin (MUCINEX) 600 MG 12 hr tablet Take 1 tablet (600 mg total) by mouth 2 (two) times daily. 30 tablet 0   losartan (COZAAR) 25 MG tablet Take 0.5 tablets (12.5 mg total) by mouth daily.  45 tablet 3   metFORMIN (GLUCOPHAGE) 500 MG tablet TAKE 1 TABLET BY MOUTH 2 TIMES DAILY WITH A MEAL. 180 tablet 1   mometasone-formoterol (DULERA) 200-5 MCG/ACT AERO Inhale 2 puffs into the lungs daily. 13 g 2   montelukast (SINGULAIR) 10 MG tablet Take 1 tablet (10 mg total) by mouth at bedtime. 30 tablet 3   nebivolol (BYSTOLIC) 5 MG tablet TAKE 1 TABLET BY MOUTH EVERY DAY 90 tablet 1   nitroGLYCERIN (NITROSTAT) 0.4 MG SL tablet Place 1 tablet (0.4 mg total) under the tongue every 5 (five) minutes as needed for chest pain. 25 tablet 0   spironolactone (ALDACTONE) 25 MG tablet Take 0.5 tablets (12.5 mg total) by mouth daily. 45 tablet 3   triamcinolone (NASACORT ALLERGY 24HR) 55 MCG/ACT AERO nasal inhaler Place 2 sprays into the nose daily.     vitamin B-12 1000 MCG tablet Take 1 tablet (1,000 mcg total) by mouth daily.     No current facility-administered medications for this encounter.    Vitals:   06/05/23 1410  BP: (!) 138/50  Pulse: 79  SpO2: 96%  Weight: 83.6 kg (184 lb 6 oz)   Wt Readings from Last 3 Encounters:  06/05/23 83.6 kg (184 lb 6 oz)  05/12/23 85 kg (187 lb 8 oz)  05/09/23 85.8 kg (189 lb 3.2 oz)     PHYSICAL EXAM: General:  Ambulated in the clinic with a walker. No resp difficulty HEENT: normal Neck: supple. JVP flat. Carotids 2+ bilaterally; no bruits. No lymphadenopathy or thryomegaly appreciated. Cor: PMI normal. Regular rate & rhythm. No rubs, gallops or murmurs. Lungs: Decreased throughout on 2 liters.  Abdomen: soft, nontender, nondistended. No hepatosplenomegaly. No bruits or masses. Good bowel sounds. Extremities: no cyanosis, clubbing, rash, edema Neuro: alert & orientedx3, cranial nerves grossly intact. Moves all 4 extremities w/o difficulty. Affect pleasant.  ASSESSMENT & PLAN:  1. HFrEF/iCM -Dates back to 2015. EF 30-35% after MI in 09/2013.  Cath with multivessel CAD. Declined CABG. EF recovered in 08/15. -EF down to 35-40% in 2019. This was in  setting of septic/hypovolemic shock. -Had not been followed by Cardiology and not on optimal GDMT.  -Echo 09/24: EF 25-30%, thin and hyperechoic apical myocardium, RWMA, RV okay, moderate MR, mild mitral stenosis with mean gradient 5.5 mmHg, mild AS with mean gradient of 12 mmHg -NYHA III at her baseline.  - Volume status stable. Continue lasix 40 mg daily  -Continue losartan 12.5 mg daily -Continue bisoprolol 5 mg daily -Continue  spironolactone 12.5 mg daily -On jardiance 10 mg daily - Check BMET    2. CAD -Late presentation ST elevation MI in 09/2013. Found to have multivessel CAD on cath. - She declined CABG. -On aspirin  -Continue high-intensity statin + zetia. Lipid management per Cardiology. Goal LDL < 55 - No chest pain    3. COPD Chronic respiratory failure with hypercapnia -On home O2 - Sats stable on 2 liters [place].    4. CKD IIIa  -Cr recently averaging around 1.3   Follow up as needed.   Lunden Mcleish NP-C  2:20 PM

## 2023-06-10 ENCOUNTER — Other Ambulatory Visit: Payer: Self-pay | Admitting: Family Medicine

## 2023-06-14 ENCOUNTER — Ambulatory Visit: Payer: Medicare Other | Admitting: Cardiovascular Disease

## 2023-06-16 ENCOUNTER — Ambulatory Visit (HOSPITAL_BASED_OUTPATIENT_CLINIC_OR_DEPARTMENT_OTHER): Payer: Medicare Other | Admitting: Family

## 2023-06-16 ENCOUNTER — Encounter (HOSPITAL_BASED_OUTPATIENT_CLINIC_OR_DEPARTMENT_OTHER): Payer: Self-pay | Admitting: Family

## 2023-06-16 VITALS — BP 130/68 | HR 74 | Ht 61.0 in | Wt 174.8 lb

## 2023-06-16 DIAGNOSIS — I255 Ischemic cardiomyopathy: Secondary | ICD-10-CM

## 2023-06-16 DIAGNOSIS — I5020 Unspecified systolic (congestive) heart failure: Secondary | ICD-10-CM | POA: Diagnosis not present

## 2023-06-16 DIAGNOSIS — I2510 Atherosclerotic heart disease of native coronary artery without angina pectoris: Secondary | ICD-10-CM | POA: Diagnosis not present

## 2023-06-16 DIAGNOSIS — R5381 Other malaise: Secondary | ICD-10-CM | POA: Diagnosis not present

## 2023-06-16 DIAGNOSIS — E785 Hyperlipidemia, unspecified: Secondary | ICD-10-CM | POA: Diagnosis not present

## 2023-06-16 MED ORDER — FUROSEMIDE 40 MG PO TABS: 40.0000 mg | ORAL_TABLET | ORAL | 1 refills | Status: DC

## 2023-06-16 NOTE — Progress Notes (Unsigned)
  Cardiology Office Note:  .   Date:  06/16/2023  ID:  Beverly Wheeler, DOB 05-Apr-1946, MRN [id number] PCP: [place], Betty G, MD  [facility] Providers Cardiologist:  Thurmon Fair, MD { Click to update primary MD,subspecialty MD or APP then REFRESH:1}   History of Present Illness: Beverly Wheeler Kitchen   Beverly Wheeler is a 77 y.o. female ***  03/2023 referred to [facility] on home health ***  Note smore difficulties with allergies and runny nose. Note sher breathing has been stable. Does note more dyspnea if she is doing more   Enjoys reading and watching TV during the day. INterested in "building up her strength".   Swelling by the endof the   Notes increased urination particularly at night. Has to use pull ups.   Restricting to <64 oz  per day. She is not adding salt.   REFER TO HH.   ???NOT TAKING LASIX  03/2023 LDL 129  1. HFrEF/iCM -Dates back to 2015. EF 30-35% after MI in 09/2013. Cath with multivessel CAD. Declined CABG. EF recovered in 08/15. -EF down to 35-40% in 2019. This was in setting of septic/hypovolemic shock. -Had not been followed by Cardiology and not on optimal GDMT.  -Echo 09/24: EF 25-30%, thin and hyperechoic apical myocardium, RWMA, RV okay, moderate MR, mild mitral stenosis with mean gradient 5.5 mmHg, mild AS with mean gradient of 12 mmHg -NYHA III at her baseline.  - Volume status stable. Continue lasix 40 mg daily  -Continue losartan 12.5 mg daily -Continue bisoprolol 5 mg daily -Continue  spironolactone 12.5 mg daily -On jardiance 10 mg daily - Check BMET    2. CAD -Late presentation ST elevation MI in 09/2013. Found to have multivessel CAD on cath. - She declined CABG. -On aspirin  -Continue high-intensity statin + zetia. Lipid management per Cardiology. Goal LDL < 55 - No chest pain    3. COPD Chronic respiratory failure with hypercapnia -On home O2 - Sats stable on 2 liters [place].    4. CKD IIIa  -Cr recently averaging around 1.3    ROS:  ***  Studies Reviewed: .        *** Risk Assessment/Calculations:   {Does this patient have ATRIAL FIBRILLATION?[phone]}         Physical Exam:   VS:  BP 130/68   Pulse (!) 33   Ht 5\' 1"  (1.549 m)   Wt 174 lb 12.8 oz (79.3 kg)   SpO2 (!) 83%   BMI 33.03 kg/m    Wt Readings from Last 3 Encounters:  06/16/23 174 lb 12.8 oz (79.3 kg)  06/05/23 184 lb 6 oz (83.6 kg)  05/12/23 187 lb 8 oz (85 kg)    GEN: Well nourished, well developed in no acute distress NECK: No JVD; No carotid bruits CARDIAC: ***RRR, no murmurs, rubs, gallops RESPIRATORY:  Clear to auscultation without rales, wheezing or rhonchi  ABDOMEN: Soft, non-tender, non-distended EXTREMITIES:  No edema; No deformity   ASSESSMENT AND PLAN: .   ***    {Are you ordering a CV Procedure (e.g. stress test, cath, DCCV, TEE, etc)?   Press F2        :[id number]}  Dispo: ***  Signed, Alver Sorrow, NP

## 2023-06-16 NOTE — Patient Instructions (Addendum)
Medication Instructions:  Your physician has recommended you make the following change in your medication:   START Lasix 40mg  three times a week  *If you need a refill on your cardiac medications before your next appointment, please call your pharmacy*   Follow-Up: At [facility], you and your health needs are our priority.  As part of our continuing mission to provide you with exceptional heart care, we have created designated Provider Care Teams.  These Care Teams include your primary Cardiologist (physician) and Advanced Practice Providers (APPs -  Physician Assistants and Nurse Practitioners) who all work together to provide you with the care you need, when you need it.  We recommend signing up for the patient portal called "MyChart".  Sign up information is provided on this After Visit Summary.  MyChart is used to connect with patients for Virtual Visits (Telemedicine).  Patients are able to view lab/test results, encounter notes, upcoming appointments, etc.  Non-urgent messages can be sent to your provider as well.   To learn more about what you can do with MyChart, go to [email or web address].    Your next appointment:   2 month(s)  Provider:   Jodelle Red, MD     You have been referred to home health.  Exercises to do While Sitting Warm-up Before starting other exercises: Sit up as straight as you can. Have your knees bent at 90 degrees, which is the shape of the capital letter "L." Keep your feet flat on the floor. Sit at the front edge of your chair, if you can. Pull in (tighten) the muscles in your abdomen and stretch your spine and neck as straight as you can. Hold this position for a few minutes. Breathe in and out evenly. Try to concentrate on your breathing, and relax your mind.  Stretching Exercise A: Arm stretch Hold your arms out straight in front of your body. Bend your hands at the wrist with your fingers pointing up, as if signaling someone  to stop. Notice the slight tension in your forearms as you hold the position. Keeping your arms out and your hands bent, rotate your hands outward as far as you can and hold this stretch. Aim to have your thumbs pointing up and your pinkie fingers pointing down. Slowly repeat arm stretches for one minute as tolerated. Exercise B: Leg stretch If you can move your legs, try to "draw" letters on the floor with the toes of your foot. Write your name with one foot. Write your name with the toes of your other foot. Slowly repeat the movements for one minute as tolerated. Exercise C: Reach for the sky Reach your hands as far over your head as you can to stretch your spine. Move your hands and arms as if you are climbing a rope. Slowly repeat the movements for one minute as tolerated.  Range of motion exercises Exercise A: Shoulder roll Let your arms hang loosely at your sides. Lift just your shoulders up toward your ears, then let them relax back down. When your shoulders feel loose, rotate your shoulders in backward and forward circles. Do shoulder rolls slowly for one minute as tolerated. Exercise B: March in place As if you are marching, pump your arms and lift your legs up and down. Lift your knees as high as you can. If you are unable to lift your knees, just pump your arms and move your ankles and feet up and down. March in place for one minute as tolerated. Exercise C:  Seated jumping jacks Let your arms hang down straight. Keeping your arms straight, lift them up over your head. Aim to point your fingers to the ceiling. While you lift your arms, straighten your legs and slide your heels along the floor to your sides, as wide as you can. As you bring your arms back down to your sides, slide your legs back together. If you are unable to use your legs, just move your arms. Slowly repeat seated jumping jacks for one minute as tolerated.  Strengthening exercises Exercise A: Shoulder  squeeze Hold your arms straight out from your body to your sides, with your elbows bent and your fists pointed at the ceiling. Keeping your arms in the bent position, move them forward so your elbows and forearms meet in front of your face. Open your arms back out as wide as you can with your elbows still bent, until you feel your shoulder blades squeezing together. Hold for 5 seconds. Slowly repeat the movements forward and backward for one minute as tolerated.

## 2023-06-20 ENCOUNTER — Encounter (HOSPITAL_BASED_OUTPATIENT_CLINIC_OR_DEPARTMENT_OTHER): Payer: Self-pay | Admitting: Family

## 2023-06-23 ENCOUNTER — Other Ambulatory Visit: Payer: Self-pay | Admitting: Family Medicine

## 2023-06-23 DIAGNOSIS — E1169 Type 2 diabetes mellitus with other specified complication: Secondary | ICD-10-CM

## 2023-06-23 DIAGNOSIS — F419 Anxiety disorder, unspecified: Secondary | ICD-10-CM

## 2023-06-25 DIAGNOSIS — I252 Old myocardial infarction: Secondary | ICD-10-CM | POA: Diagnosis not present

## 2023-06-25 DIAGNOSIS — Z7951 Long term (current) use of inhaled steroids: Secondary | ICD-10-CM | POA: Diagnosis not present

## 2023-06-25 DIAGNOSIS — Z7982 Long term (current) use of aspirin: Secondary | ICD-10-CM | POA: Diagnosis not present

## 2023-06-25 DIAGNOSIS — Z951 Presence of aortocoronary bypass graft: Secondary | ICD-10-CM | POA: Diagnosis not present

## 2023-06-25 DIAGNOSIS — J9612 Chronic respiratory failure with hypercapnia: Secondary | ICD-10-CM | POA: Diagnosis not present

## 2023-06-25 DIAGNOSIS — I130 Hypertensive heart and chronic kidney disease with heart failure and stage 1 through stage 4 chronic kidney disease, or unspecified chronic kidney disease: Secondary | ICD-10-CM | POA: Diagnosis not present

## 2023-06-25 DIAGNOSIS — E1122 Type 2 diabetes mellitus with diabetic chronic kidney disease: Secondary | ICD-10-CM | POA: Diagnosis not present

## 2023-06-25 DIAGNOSIS — I2510 Atherosclerotic heart disease of native coronary artery without angina pectoris: Secondary | ICD-10-CM | POA: Diagnosis not present

## 2023-06-25 DIAGNOSIS — I5022 Chronic systolic (congestive) heart failure: Secondary | ICD-10-CM | POA: Diagnosis not present

## 2023-06-25 DIAGNOSIS — Z7984 Long term (current) use of oral hypoglycemic drugs: Secondary | ICD-10-CM | POA: Diagnosis not present

## 2023-06-25 DIAGNOSIS — E785 Hyperlipidemia, unspecified: Secondary | ICD-10-CM | POA: Diagnosis not present

## 2023-06-25 DIAGNOSIS — Z720 Tobacco use: Secondary | ICD-10-CM | POA: Diagnosis not present

## 2023-06-25 DIAGNOSIS — J449 Chronic obstructive pulmonary disease, unspecified: Secondary | ICD-10-CM | POA: Diagnosis not present

## 2023-06-25 DIAGNOSIS — Z556 Problems related to health literacy: Secondary | ICD-10-CM | POA: Diagnosis not present

## 2023-06-25 DIAGNOSIS — N1832 Chronic kidney disease, stage 3b: Secondary | ICD-10-CM | POA: Diagnosis not present

## 2023-06-25 DIAGNOSIS — I255 Ischemic cardiomyopathy: Secondary | ICD-10-CM | POA: Diagnosis not present

## 2023-06-25 DIAGNOSIS — Z9981 Dependence on supplemental oxygen: Secondary | ICD-10-CM | POA: Diagnosis not present

## 2023-06-25 DIAGNOSIS — I083 Combined rheumatic disorders of mitral, aortic and tricuspid valves: Secondary | ICD-10-CM | POA: Diagnosis not present

## 2023-07-07 DIAGNOSIS — E1122 Type 2 diabetes mellitus with diabetic chronic kidney disease: Secondary | ICD-10-CM | POA: Diagnosis not present

## 2023-07-07 DIAGNOSIS — Z720 Tobacco use: Secondary | ICD-10-CM | POA: Diagnosis not present

## 2023-07-07 DIAGNOSIS — Z951 Presence of aortocoronary bypass graft: Secondary | ICD-10-CM | POA: Diagnosis not present

## 2023-07-07 DIAGNOSIS — I130 Hypertensive heart and chronic kidney disease with heart failure and stage 1 through stage 4 chronic kidney disease, or unspecified chronic kidney disease: Secondary | ICD-10-CM | POA: Diagnosis not present

## 2023-07-07 DIAGNOSIS — J449 Chronic obstructive pulmonary disease, unspecified: Secondary | ICD-10-CM | POA: Diagnosis not present

## 2023-07-07 DIAGNOSIS — I083 Combined rheumatic disorders of mitral, aortic and tricuspid valves: Secondary | ICD-10-CM | POA: Diagnosis not present

## 2023-07-07 DIAGNOSIS — Z556 Problems related to health literacy: Secondary | ICD-10-CM | POA: Diagnosis not present

## 2023-07-07 DIAGNOSIS — N1832 Chronic kidney disease, stage 3b: Secondary | ICD-10-CM | POA: Diagnosis not present

## 2023-07-07 DIAGNOSIS — Z9981 Dependence on supplemental oxygen: Secondary | ICD-10-CM | POA: Diagnosis not present

## 2023-07-07 DIAGNOSIS — I255 Ischemic cardiomyopathy: Secondary | ICD-10-CM | POA: Diagnosis not present

## 2023-07-07 DIAGNOSIS — I252 Old myocardial infarction: Secondary | ICD-10-CM | POA: Diagnosis not present

## 2023-07-07 DIAGNOSIS — I2510 Atherosclerotic heart disease of native coronary artery without angina pectoris: Secondary | ICD-10-CM | POA: Diagnosis not present

## 2023-07-07 DIAGNOSIS — J9612 Chronic respiratory failure with hypercapnia: Secondary | ICD-10-CM | POA: Diagnosis not present

## 2023-07-07 DIAGNOSIS — Z7984 Long term (current) use of oral hypoglycemic drugs: Secondary | ICD-10-CM | POA: Diagnosis not present

## 2023-07-07 DIAGNOSIS — Z7951 Long term (current) use of inhaled steroids: Secondary | ICD-10-CM | POA: Diagnosis not present

## 2023-07-07 DIAGNOSIS — E785 Hyperlipidemia, unspecified: Secondary | ICD-10-CM | POA: Diagnosis not present

## 2023-07-07 DIAGNOSIS — I5022 Chronic systolic (congestive) heart failure: Secondary | ICD-10-CM | POA: Diagnosis not present

## 2023-07-07 DIAGNOSIS — Z7982 Long term (current) use of aspirin: Secondary | ICD-10-CM | POA: Diagnosis not present

## 2023-07-10 DIAGNOSIS — Z7951 Long term (current) use of inhaled steroids: Secondary | ICD-10-CM | POA: Diagnosis not present

## 2023-07-10 DIAGNOSIS — I5022 Chronic systolic (congestive) heart failure: Secondary | ICD-10-CM | POA: Diagnosis not present

## 2023-07-10 DIAGNOSIS — I130 Hypertensive heart and chronic kidney disease with heart failure and stage 1 through stage 4 chronic kidney disease, or unspecified chronic kidney disease: Secondary | ICD-10-CM | POA: Diagnosis not present

## 2023-07-10 DIAGNOSIS — Z720 Tobacco use: Secondary | ICD-10-CM | POA: Diagnosis not present

## 2023-07-10 DIAGNOSIS — I255 Ischemic cardiomyopathy: Secondary | ICD-10-CM | POA: Diagnosis not present

## 2023-07-10 DIAGNOSIS — N1832 Chronic kidney disease, stage 3b: Secondary | ICD-10-CM | POA: Diagnosis not present

## 2023-07-10 DIAGNOSIS — Z7984 Long term (current) use of oral hypoglycemic drugs: Secondary | ICD-10-CM | POA: Diagnosis not present

## 2023-07-10 DIAGNOSIS — I252 Old myocardial infarction: Secondary | ICD-10-CM | POA: Diagnosis not present

## 2023-07-10 DIAGNOSIS — J9612 Chronic respiratory failure with hypercapnia: Secondary | ICD-10-CM | POA: Diagnosis not present

## 2023-07-10 DIAGNOSIS — Z7982 Long term (current) use of aspirin: Secondary | ICD-10-CM | POA: Diagnosis not present

## 2023-07-10 DIAGNOSIS — I083 Combined rheumatic disorders of mitral, aortic and tricuspid valves: Secondary | ICD-10-CM | POA: Diagnosis not present

## 2023-07-10 DIAGNOSIS — Z951 Presence of aortocoronary bypass graft: Secondary | ICD-10-CM | POA: Diagnosis not present

## 2023-07-10 DIAGNOSIS — E785 Hyperlipidemia, unspecified: Secondary | ICD-10-CM | POA: Diagnosis not present

## 2023-07-10 DIAGNOSIS — Z556 Problems related to health literacy: Secondary | ICD-10-CM | POA: Diagnosis not present

## 2023-07-10 DIAGNOSIS — Z9981 Dependence on supplemental oxygen: Secondary | ICD-10-CM | POA: Diagnosis not present

## 2023-07-10 DIAGNOSIS — E1122 Type 2 diabetes mellitus with diabetic chronic kidney disease: Secondary | ICD-10-CM | POA: Diagnosis not present

## 2023-07-10 DIAGNOSIS — I2510 Atherosclerotic heart disease of native coronary artery without angina pectoris: Secondary | ICD-10-CM | POA: Diagnosis not present

## 2023-07-10 DIAGNOSIS — J449 Chronic obstructive pulmonary disease, unspecified: Secondary | ICD-10-CM | POA: Diagnosis not present

## 2023-07-14 DIAGNOSIS — I2510 Atherosclerotic heart disease of native coronary artery without angina pectoris: Secondary | ICD-10-CM | POA: Diagnosis not present

## 2023-07-14 DIAGNOSIS — Z720 Tobacco use: Secondary | ICD-10-CM | POA: Diagnosis not present

## 2023-07-14 DIAGNOSIS — J9612 Chronic respiratory failure with hypercapnia: Secondary | ICD-10-CM | POA: Diagnosis not present

## 2023-07-14 DIAGNOSIS — Z556 Problems related to health literacy: Secondary | ICD-10-CM | POA: Diagnosis not present

## 2023-07-14 DIAGNOSIS — E785 Hyperlipidemia, unspecified: Secondary | ICD-10-CM | POA: Diagnosis not present

## 2023-07-14 DIAGNOSIS — I252 Old myocardial infarction: Secondary | ICD-10-CM | POA: Diagnosis not present

## 2023-07-14 DIAGNOSIS — Z7984 Long term (current) use of oral hypoglycemic drugs: Secondary | ICD-10-CM | POA: Diagnosis not present

## 2023-07-14 DIAGNOSIS — I083 Combined rheumatic disorders of mitral, aortic and tricuspid valves: Secondary | ICD-10-CM | POA: Diagnosis not present

## 2023-07-14 DIAGNOSIS — E1122 Type 2 diabetes mellitus with diabetic chronic kidney disease: Secondary | ICD-10-CM | POA: Diagnosis not present

## 2023-07-14 DIAGNOSIS — N1832 Chronic kidney disease, stage 3b: Secondary | ICD-10-CM | POA: Diagnosis not present

## 2023-07-14 DIAGNOSIS — I255 Ischemic cardiomyopathy: Secondary | ICD-10-CM | POA: Diagnosis not present

## 2023-07-14 DIAGNOSIS — Z951 Presence of aortocoronary bypass graft: Secondary | ICD-10-CM | POA: Diagnosis not present

## 2023-07-14 DIAGNOSIS — Z7982 Long term (current) use of aspirin: Secondary | ICD-10-CM | POA: Diagnosis not present

## 2023-07-14 DIAGNOSIS — Z7951 Long term (current) use of inhaled steroids: Secondary | ICD-10-CM | POA: Diagnosis not present

## 2023-07-14 DIAGNOSIS — Z9981 Dependence on supplemental oxygen: Secondary | ICD-10-CM | POA: Diagnosis not present

## 2023-07-14 DIAGNOSIS — I130 Hypertensive heart and chronic kidney disease with heart failure and stage 1 through stage 4 chronic kidney disease, or unspecified chronic kidney disease: Secondary | ICD-10-CM | POA: Diagnosis not present

## 2023-07-14 DIAGNOSIS — J449 Chronic obstructive pulmonary disease, unspecified: Secondary | ICD-10-CM | POA: Diagnosis not present

## 2023-07-14 DIAGNOSIS — I5022 Chronic systolic (congestive) heart failure: Secondary | ICD-10-CM | POA: Diagnosis not present

## 2023-07-17 DIAGNOSIS — N1832 Chronic kidney disease, stage 3b: Secondary | ICD-10-CM | POA: Diagnosis not present

## 2023-07-17 DIAGNOSIS — I2510 Atherosclerotic heart disease of native coronary artery without angina pectoris: Secondary | ICD-10-CM | POA: Diagnosis not present

## 2023-07-17 DIAGNOSIS — E1122 Type 2 diabetes mellitus with diabetic chronic kidney disease: Secondary | ICD-10-CM | POA: Diagnosis not present

## 2023-07-17 DIAGNOSIS — I255 Ischemic cardiomyopathy: Secondary | ICD-10-CM | POA: Diagnosis not present

## 2023-07-17 DIAGNOSIS — J449 Chronic obstructive pulmonary disease, unspecified: Secondary | ICD-10-CM | POA: Diagnosis not present

## 2023-07-17 DIAGNOSIS — J9612 Chronic respiratory failure with hypercapnia: Secondary | ICD-10-CM | POA: Diagnosis not present

## 2023-07-17 DIAGNOSIS — Z7951 Long term (current) use of inhaled steroids: Secondary | ICD-10-CM | POA: Diagnosis not present

## 2023-07-17 DIAGNOSIS — Z556 Problems related to health literacy: Secondary | ICD-10-CM | POA: Diagnosis not present

## 2023-07-17 DIAGNOSIS — I130 Hypertensive heart and chronic kidney disease with heart failure and stage 1 through stage 4 chronic kidney disease, or unspecified chronic kidney disease: Secondary | ICD-10-CM | POA: Diagnosis not present

## 2023-07-17 DIAGNOSIS — I252 Old myocardial infarction: Secondary | ICD-10-CM | POA: Diagnosis not present

## 2023-07-17 DIAGNOSIS — Z7984 Long term (current) use of oral hypoglycemic drugs: Secondary | ICD-10-CM | POA: Diagnosis not present

## 2023-07-17 DIAGNOSIS — Z9981 Dependence on supplemental oxygen: Secondary | ICD-10-CM | POA: Diagnosis not present

## 2023-07-17 DIAGNOSIS — Z720 Tobacco use: Secondary | ICD-10-CM | POA: Diagnosis not present

## 2023-07-17 DIAGNOSIS — I083 Combined rheumatic disorders of mitral, aortic and tricuspid valves: Secondary | ICD-10-CM | POA: Diagnosis not present

## 2023-07-17 DIAGNOSIS — Z7982 Long term (current) use of aspirin: Secondary | ICD-10-CM | POA: Diagnosis not present

## 2023-07-17 DIAGNOSIS — Z951 Presence of aortocoronary bypass graft: Secondary | ICD-10-CM | POA: Diagnosis not present

## 2023-07-17 DIAGNOSIS — E785 Hyperlipidemia, unspecified: Secondary | ICD-10-CM | POA: Diagnosis not present

## 2023-07-17 DIAGNOSIS — I5022 Chronic systolic (congestive) heart failure: Secondary | ICD-10-CM | POA: Diagnosis not present

## 2023-07-23 DIAGNOSIS — I130 Hypertensive heart and chronic kidney disease with heart failure and stage 1 through stage 4 chronic kidney disease, or unspecified chronic kidney disease: Secondary | ICD-10-CM | POA: Diagnosis not present

## 2023-07-23 DIAGNOSIS — Z951 Presence of aortocoronary bypass graft: Secondary | ICD-10-CM | POA: Diagnosis not present

## 2023-07-23 DIAGNOSIS — E1122 Type 2 diabetes mellitus with diabetic chronic kidney disease: Secondary | ICD-10-CM | POA: Diagnosis not present

## 2023-07-23 DIAGNOSIS — Z7982 Long term (current) use of aspirin: Secondary | ICD-10-CM | POA: Diagnosis not present

## 2023-07-23 DIAGNOSIS — Z9981 Dependence on supplemental oxygen: Secondary | ICD-10-CM | POA: Diagnosis not present

## 2023-07-23 DIAGNOSIS — E785 Hyperlipidemia, unspecified: Secondary | ICD-10-CM | POA: Diagnosis not present

## 2023-07-23 DIAGNOSIS — J449 Chronic obstructive pulmonary disease, unspecified: Secondary | ICD-10-CM | POA: Diagnosis not present

## 2023-07-23 DIAGNOSIS — Z556 Problems related to health literacy: Secondary | ICD-10-CM | POA: Diagnosis not present

## 2023-07-23 DIAGNOSIS — J9612 Chronic respiratory failure with hypercapnia: Secondary | ICD-10-CM | POA: Diagnosis not present

## 2023-07-23 DIAGNOSIS — I083 Combined rheumatic disorders of mitral, aortic and tricuspid valves: Secondary | ICD-10-CM | POA: Diagnosis not present

## 2023-07-23 DIAGNOSIS — I252 Old myocardial infarction: Secondary | ICD-10-CM | POA: Diagnosis not present

## 2023-07-23 DIAGNOSIS — I5022 Chronic systolic (congestive) heart failure: Secondary | ICD-10-CM | POA: Diagnosis not present

## 2023-07-23 DIAGNOSIS — Z720 Tobacco use: Secondary | ICD-10-CM | POA: Diagnosis not present

## 2023-07-23 DIAGNOSIS — I2510 Atherosclerotic heart disease of native coronary artery without angina pectoris: Secondary | ICD-10-CM | POA: Diagnosis not present

## 2023-07-23 DIAGNOSIS — Z7984 Long term (current) use of oral hypoglycemic drugs: Secondary | ICD-10-CM | POA: Diagnosis not present

## 2023-07-23 DIAGNOSIS — I255 Ischemic cardiomyopathy: Secondary | ICD-10-CM | POA: Diagnosis not present

## 2023-07-23 DIAGNOSIS — N1832 Chronic kidney disease, stage 3b: Secondary | ICD-10-CM | POA: Diagnosis not present

## 2023-07-23 DIAGNOSIS — Z7951 Long term (current) use of inhaled steroids: Secondary | ICD-10-CM | POA: Diagnosis not present

## 2023-08-04 DIAGNOSIS — I083 Combined rheumatic disorders of mitral, aortic and tricuspid valves: Secondary | ICD-10-CM | POA: Diagnosis not present

## 2023-08-04 DIAGNOSIS — Z556 Problems related to health literacy: Secondary | ICD-10-CM | POA: Diagnosis not present

## 2023-08-04 DIAGNOSIS — J449 Chronic obstructive pulmonary disease, unspecified: Secondary | ICD-10-CM | POA: Diagnosis not present

## 2023-08-04 DIAGNOSIS — Z720 Tobacco use: Secondary | ICD-10-CM | POA: Diagnosis not present

## 2023-08-04 DIAGNOSIS — Z7984 Long term (current) use of oral hypoglycemic drugs: Secondary | ICD-10-CM | POA: Diagnosis not present

## 2023-08-04 DIAGNOSIS — I130 Hypertensive heart and chronic kidney disease with heart failure and stage 1 through stage 4 chronic kidney disease, or unspecified chronic kidney disease: Secondary | ICD-10-CM | POA: Diagnosis not present

## 2023-08-04 DIAGNOSIS — I252 Old myocardial infarction: Secondary | ICD-10-CM | POA: Diagnosis not present

## 2023-08-04 DIAGNOSIS — Z9981 Dependence on supplemental oxygen: Secondary | ICD-10-CM | POA: Diagnosis not present

## 2023-08-04 DIAGNOSIS — I2510 Atherosclerotic heart disease of native coronary artery without angina pectoris: Secondary | ICD-10-CM | POA: Diagnosis not present

## 2023-08-04 DIAGNOSIS — Z7951 Long term (current) use of inhaled steroids: Secondary | ICD-10-CM | POA: Diagnosis not present

## 2023-08-04 DIAGNOSIS — I255 Ischemic cardiomyopathy: Secondary | ICD-10-CM | POA: Diagnosis not present

## 2023-08-04 DIAGNOSIS — N1832 Chronic kidney disease, stage 3b: Secondary | ICD-10-CM | POA: Diagnosis not present

## 2023-08-04 DIAGNOSIS — E1122 Type 2 diabetes mellitus with diabetic chronic kidney disease: Secondary | ICD-10-CM | POA: Diagnosis not present

## 2023-08-04 DIAGNOSIS — Z7982 Long term (current) use of aspirin: Secondary | ICD-10-CM | POA: Diagnosis not present

## 2023-08-04 DIAGNOSIS — I5022 Chronic systolic (congestive) heart failure: Secondary | ICD-10-CM | POA: Diagnosis not present

## 2023-08-04 DIAGNOSIS — Z951 Presence of aortocoronary bypass graft: Secondary | ICD-10-CM | POA: Diagnosis not present

## 2023-08-04 DIAGNOSIS — J9612 Chronic respiratory failure with hypercapnia: Secondary | ICD-10-CM | POA: Diagnosis not present

## 2023-08-04 DIAGNOSIS — E785 Hyperlipidemia, unspecified: Secondary | ICD-10-CM | POA: Diagnosis not present

## 2023-08-07 ENCOUNTER — Telehealth: Payer: Self-pay

## 2023-08-07 DIAGNOSIS — J449 Chronic obstructive pulmonary disease, unspecified: Secondary | ICD-10-CM

## 2023-08-07 MED ORDER — DULERA 200-5 MCG/ACT IN AERO: 2.0000 | INHALATION_SPRAY | Freq: Every day | RESPIRATORY_TRACT | 2 refills | Status: DC

## 2023-08-07 NOTE — Telephone Encounter (Signed)
 Copied from CRM [id number]. Topic: Clinical - Medication Refill >> Aug 07, 2023  2:03 PM Laurier BROCKS wrote: Most Recent Primary Care Visit:  Provider: JORDAN, BETTY G  Department: [facility]  Visit Type: OFFICE VISIT  Date: 05/12/2023  Medication: mometasone -formoterol  (DULERA ) 200-5 MCG/ACT AERO   Has the patient contacted their pharmacy? Yes (Agent: If no, request that the patient contact the pharmacy for the refill. If patient does not wish to contact the pharmacy document the reason why and proceed with request.) (Agent: If yes, when and what did the pharmacy advise?)  Is this the correct pharmacy for this prescription? Yes If no, delete pharmacy and type the correct one.  This is the patient's preferred pharmacy:  [id number] - [place], [place] - [address] AT [address] OF [facility] [address] [place] [address] Phone: [phone] Fax: [phone]   Has the prescription been filled recently? Yes  Is the patient out of the medication? Yes  Has the patient been seen for an appointment in the last year OR does the patient have an upcoming appointment? Yes  Can we respond through MyChart? Yes  Agent: Please be advised that Rx refills may take up to 3 business days. We ask that you follow-up with your pharmacy.

## 2023-08-08 ENCOUNTER — Ambulatory Visit (HOSPITAL_BASED_OUTPATIENT_CLINIC_OR_DEPARTMENT_OTHER): Payer: Medicare Other | Admitting: Cardiology

## 2023-08-10 DIAGNOSIS — N1832 Chronic kidney disease, stage 3b: Secondary | ICD-10-CM | POA: Diagnosis not present

## 2023-08-10 DIAGNOSIS — E785 Hyperlipidemia, unspecified: Secondary | ICD-10-CM | POA: Diagnosis not present

## 2023-08-10 DIAGNOSIS — Z9981 Dependence on supplemental oxygen: Secondary | ICD-10-CM | POA: Diagnosis not present

## 2023-08-10 DIAGNOSIS — Z7951 Long term (current) use of inhaled steroids: Secondary | ICD-10-CM | POA: Diagnosis not present

## 2023-08-10 DIAGNOSIS — I130 Hypertensive heart and chronic kidney disease with heart failure and stage 1 through stage 4 chronic kidney disease, or unspecified chronic kidney disease: Secondary | ICD-10-CM | POA: Diagnosis not present

## 2023-08-10 DIAGNOSIS — J9612 Chronic respiratory failure with hypercapnia: Secondary | ICD-10-CM | POA: Diagnosis not present

## 2023-08-10 DIAGNOSIS — Z7984 Long term (current) use of oral hypoglycemic drugs: Secondary | ICD-10-CM | POA: Diagnosis not present

## 2023-08-10 DIAGNOSIS — Z556 Problems related to health literacy: Secondary | ICD-10-CM | POA: Diagnosis not present

## 2023-08-10 DIAGNOSIS — Z720 Tobacco use: Secondary | ICD-10-CM | POA: Diagnosis not present

## 2023-08-10 DIAGNOSIS — I252 Old myocardial infarction: Secondary | ICD-10-CM | POA: Diagnosis not present

## 2023-08-10 DIAGNOSIS — J449 Chronic obstructive pulmonary disease, unspecified: Secondary | ICD-10-CM | POA: Diagnosis not present

## 2023-08-10 DIAGNOSIS — I2510 Atherosclerotic heart disease of native coronary artery without angina pectoris: Secondary | ICD-10-CM | POA: Diagnosis not present

## 2023-08-10 DIAGNOSIS — Z951 Presence of aortocoronary bypass graft: Secondary | ICD-10-CM | POA: Diagnosis not present

## 2023-08-10 DIAGNOSIS — I5022 Chronic systolic (congestive) heart failure: Secondary | ICD-10-CM | POA: Diagnosis not present

## 2023-08-10 DIAGNOSIS — I083 Combined rheumatic disorders of mitral, aortic and tricuspid valves: Secondary | ICD-10-CM | POA: Diagnosis not present

## 2023-08-10 DIAGNOSIS — Z7982 Long term (current) use of aspirin: Secondary | ICD-10-CM | POA: Diagnosis not present

## 2023-08-10 DIAGNOSIS — I255 Ischemic cardiomyopathy: Secondary | ICD-10-CM | POA: Diagnosis not present

## 2023-08-10 DIAGNOSIS — E1122 Type 2 diabetes mellitus with diabetic chronic kidney disease: Secondary | ICD-10-CM | POA: Diagnosis not present

## 2023-08-17 ENCOUNTER — Ambulatory Visit (HOSPITAL_BASED_OUTPATIENT_CLINIC_OR_DEPARTMENT_OTHER): Payer: Medicare Other | Admitting: Cardiology

## 2023-08-17 DIAGNOSIS — Z951 Presence of aortocoronary bypass graft: Secondary | ICD-10-CM | POA: Diagnosis not present

## 2023-08-17 DIAGNOSIS — I130 Hypertensive heart and chronic kidney disease with heart failure and stage 1 through stage 4 chronic kidney disease, or unspecified chronic kidney disease: Secondary | ICD-10-CM | POA: Diagnosis not present

## 2023-08-17 DIAGNOSIS — E1122 Type 2 diabetes mellitus with diabetic chronic kidney disease: Secondary | ICD-10-CM | POA: Diagnosis not present

## 2023-08-17 DIAGNOSIS — Z7982 Long term (current) use of aspirin: Secondary | ICD-10-CM | POA: Diagnosis not present

## 2023-08-17 DIAGNOSIS — I083 Combined rheumatic disorders of mitral, aortic and tricuspid valves: Secondary | ICD-10-CM | POA: Diagnosis not present

## 2023-08-17 DIAGNOSIS — Z556 Problems related to health literacy: Secondary | ICD-10-CM | POA: Diagnosis not present

## 2023-08-17 DIAGNOSIS — E785 Hyperlipidemia, unspecified: Secondary | ICD-10-CM | POA: Diagnosis not present

## 2023-08-17 DIAGNOSIS — N1832 Chronic kidney disease, stage 3b: Secondary | ICD-10-CM | POA: Diagnosis not present

## 2023-08-17 DIAGNOSIS — J449 Chronic obstructive pulmonary disease, unspecified: Secondary | ICD-10-CM | POA: Diagnosis not present

## 2023-08-17 DIAGNOSIS — I2510 Atherosclerotic heart disease of native coronary artery without angina pectoris: Secondary | ICD-10-CM | POA: Diagnosis not present

## 2023-08-17 DIAGNOSIS — J9612 Chronic respiratory failure with hypercapnia: Secondary | ICD-10-CM | POA: Diagnosis not present

## 2023-08-17 DIAGNOSIS — Z9981 Dependence on supplemental oxygen: Secondary | ICD-10-CM | POA: Diagnosis not present

## 2023-08-17 DIAGNOSIS — I252 Old myocardial infarction: Secondary | ICD-10-CM | POA: Diagnosis not present

## 2023-08-17 DIAGNOSIS — Z7951 Long term (current) use of inhaled steroids: Secondary | ICD-10-CM | POA: Diagnosis not present

## 2023-08-17 DIAGNOSIS — Z720 Tobacco use: Secondary | ICD-10-CM | POA: Diagnosis not present

## 2023-08-17 DIAGNOSIS — I255 Ischemic cardiomyopathy: Secondary | ICD-10-CM | POA: Diagnosis not present

## 2023-08-17 DIAGNOSIS — Z7984 Long term (current) use of oral hypoglycemic drugs: Secondary | ICD-10-CM | POA: Diagnosis not present

## 2023-08-17 DIAGNOSIS — I5022 Chronic systolic (congestive) heart failure: Secondary | ICD-10-CM | POA: Diagnosis not present

## 2023-08-23 DIAGNOSIS — J449 Chronic obstructive pulmonary disease, unspecified: Secondary | ICD-10-CM | POA: Diagnosis not present

## 2023-08-23 DIAGNOSIS — J9612 Chronic respiratory failure with hypercapnia: Secondary | ICD-10-CM | POA: Diagnosis not present

## 2023-08-23 DIAGNOSIS — Z9981 Dependence on supplemental oxygen: Secondary | ICD-10-CM | POA: Diagnosis not present

## 2023-08-23 DIAGNOSIS — I255 Ischemic cardiomyopathy: Secondary | ICD-10-CM | POA: Diagnosis not present

## 2023-08-23 DIAGNOSIS — I2510 Atherosclerotic heart disease of native coronary artery without angina pectoris: Secondary | ICD-10-CM | POA: Diagnosis not present

## 2023-08-23 DIAGNOSIS — I5022 Chronic systolic (congestive) heart failure: Secondary | ICD-10-CM | POA: Diagnosis not present

## 2023-08-23 DIAGNOSIS — Z7984 Long term (current) use of oral hypoglycemic drugs: Secondary | ICD-10-CM | POA: Diagnosis not present

## 2023-08-23 DIAGNOSIS — Z720 Tobacco use: Secondary | ICD-10-CM | POA: Diagnosis not present

## 2023-08-23 DIAGNOSIS — I083 Combined rheumatic disorders of mitral, aortic and tricuspid valves: Secondary | ICD-10-CM | POA: Diagnosis not present

## 2023-08-23 DIAGNOSIS — N1832 Chronic kidney disease, stage 3b: Secondary | ICD-10-CM | POA: Diagnosis not present

## 2023-08-23 DIAGNOSIS — E1122 Type 2 diabetes mellitus with diabetic chronic kidney disease: Secondary | ICD-10-CM | POA: Diagnosis not present

## 2023-08-23 DIAGNOSIS — I130 Hypertensive heart and chronic kidney disease with heart failure and stage 1 through stage 4 chronic kidney disease, or unspecified chronic kidney disease: Secondary | ICD-10-CM | POA: Diagnosis not present

## 2023-08-23 DIAGNOSIS — Z7982 Long term (current) use of aspirin: Secondary | ICD-10-CM | POA: Diagnosis not present

## 2023-08-23 DIAGNOSIS — Z7951 Long term (current) use of inhaled steroids: Secondary | ICD-10-CM | POA: Diagnosis not present

## 2023-08-23 DIAGNOSIS — Z556 Problems related to health literacy: Secondary | ICD-10-CM | POA: Diagnosis not present

## 2023-08-23 DIAGNOSIS — I252 Old myocardial infarction: Secondary | ICD-10-CM | POA: Diagnosis not present

## 2023-08-23 DIAGNOSIS — Z951 Presence of aortocoronary bypass graft: Secondary | ICD-10-CM | POA: Diagnosis not present

## 2023-08-23 DIAGNOSIS — E785 Hyperlipidemia, unspecified: Secondary | ICD-10-CM | POA: Diagnosis not present

## 2023-08-25 ENCOUNTER — Encounter (HOSPITAL_BASED_OUTPATIENT_CLINIC_OR_DEPARTMENT_OTHER): Payer: Self-pay | Admitting: *Deleted

## 2023-08-25 NOTE — Telephone Encounter (Signed)
 This encounter was created in error - please disregard.

## 2023-09-26 ENCOUNTER — Ambulatory Visit (INDEPENDENT_AMBULATORY_CARE_PROVIDER_SITE_OTHER): Payer: Medicare Other | Admitting: Family Medicine

## 2023-09-26 ENCOUNTER — Encounter: Payer: Self-pay | Admitting: Family Medicine

## 2023-09-26 VITALS — Wt 176.0 lb

## 2023-09-26 DIAGNOSIS — Z0000 Encounter for general adult medical examination without abnormal findings: Secondary | ICD-10-CM | POA: Diagnosis not present

## 2023-09-26 NOTE — Progress Notes (Signed)
 PATIENT CHECK-IN and HEALTH RISK ASSESSMENT QUESTIONNAIRE:  -completed by phone/video for upcoming Medicare Preventive Visit  Pre-Visit Check-in: 1)Vitals (height, wt, BP, etc) - record in vitals section for visit on day of visit Request home vitals (wt, BP, etc.) and enter into vitals, THEN update Vital Signs SmartPhrase below at the top of the HPI. See below.  2)Review and Update Medications, Allergies PMH, Surgeries, Social history in Epic 3)Hospitalizations in the last year with date/reason? Yes 09/24 Heart failure  4)Review and Update Care Team (patient's specialists) in Epic 5) Complete PHQ9 in Epic  6) Complete Fall Screening in Epic 7)Review all Health Maintenance Due and order under PCP if not done.  Medicare Wellness Patient Questionnaire:  Answer theses question about your habits: How often do you have a drink containing alcohol?no How many drinks containing alcohol do you have on a typical day when you are drinking?n/a How often do you have six or more drinks on one occasion?n/a Have you ever smoked?yes Quit date if applicable? 10/2023 How many packs a day do/did you smoke?  2 Do you use smokeless tobacco?no Do you use an illicit drugs?no On average, how many days per week do you engage in moderate to strenuous exercise (like a brisk walk)? N/a, does chair exercise, walks with walker On average, how many minutes do you engage in exercise at this level?n/a Typical breakfast: bowl of cereal, or egg sandwich or a waffle Typical lunch: Varies Typical dinner: Typical snacks: fruit and candy   Beverages:  tea  Answer theses question about your everyday activities: Can you perform most household chores?yes Are you deaf or have significant trouble hearing? no Do you feel that you have a problem with memory?no Do you feel safe at home?yes Last dentist visit?n/a  8. Do you have any difficulty performing your everyday activities?no Are you having any difficulty walking, taking  medications on your own, and or difficulty managing daily home needs?no Do you have difficulty walking or climbing stairs?yes Do you have difficulty dressing or bathing? no Do you have difficulty doing errands alone such as visiting a doctor's office or shopping?yes Do you currently have any difficulty preparing food and eating? no Do you currently have any difficulty using the toilet?no Do you have any difficulty managing your finances?no Do you have any difficulties with housekeeping of managing your housekeeping?no   Do you have Advanced Directives in place (Living Will, Healthcare Power or Attorney)? yes   Last eye Exam and location? Admits should schedule, she agrees to schedule   Do you currently use prescribed or non-prescribed narcotic or opioid pain medications? no  Do you have a history or close family history of breast, ovarian, tubal or peritoneal cancer or a family member with BRCA (breast cancer susceptibility 1 and 2) gene mutations? no     ----------------------------------------------------------------------------------------------------------------------------------------------------------------------------------------------------------------------  Because this visit was a virtual/telehealth visit, some criteria may be missing or patient reported. Any vitals not documented were not able to be obtained and vitals that have been documented are patient reported.    MEDICARE ANNUAL PREVENTIVE VISIT WITH PROVIDER: (Welcome to Medicare, initial annual wellness or annual wellness exam)  Virtual Visit via Phone Note  I connected with Anatasia Tino on 09/26/23 by phone and verified that I am speaking with the correct person using two identifiers. She declines video visit and prefers to do phone visit.   Location patient: home Location provider:work or home office Persons participating in the virtual visit: patient, provider  Concerns and/or follow  up today: doing  ok. She reports stopped her Jardiance last month as was too expensive. Wonders how to eat better for diabetes.   See HM section in Epic for other details of completed HM.    ROS: negative for report of fevers, unintentional weight loss, vision changes, vision loss, hearing loss or change, chest pain, sob, hemoptysis, melena, hematochezia, hematuria, falls, bleeding or bruising, thoughts of suicide or self harm, memory loss  Patient-completed extensive health risk assessment - reviewed and discussed with the patient: See Health Risk Assessment completed with patient prior to the visit either above or in recent phone note. This was reviewed in detailed with the patient today and appropriate recommendations, orders and referrals were placed as needed per Summary below and patient instructions.   Review of Medical History: -PMH, PSH, Family History and current specialty and care providers reviewed and updated and listed below   Patient Care Team: [place], Betty G, MD as PCP - General (Family Medicine) Jodelle Red, MD as PCP - Cardiology (Cardiology) Sherrill Raring, [facility] (Pharmacist)   Past Medical History:  Diagnosis Date   Allergy    Asthma    CAD (coronary artery disease)    COPD (chronic obstructive pulmonary disease) (HCC)    Depression    GERD (gastroesophageal reflux disease)    Hyperlipidemia    Hypertension    Myocardial infarction (HCC) 10/16/2013    Past Surgical History:  Procedure Laterality Date   CARDIAC CATHETERIZATION  10/17/13   significant 3 vessel disease   LEFT HEART CATHETERIZATION WITH CORONARY ANGIOGRAM N/A 10/17/2013   Procedure: LEFT HEART CATHETERIZATION WITH CORONARY ANGIOGRAM;  Surgeon: Lesleigh Noe, MD;  Location: [facility] CATH LAB;  Service: Cardiovascular;  Laterality: N/A;    Social History   Socioeconomic History   Marital status: Widowed    Spouse name: Not on file   Number of children: 1   Years of education: Not on file   Highest  education level: High school graduate  Occupational History   Occupation: retired  Tobacco Use   Smoking status: Former    Current packs/day: 0.00    Average packs/day: 1 pack/day for 45.0 years (45.0 ttl pk-yrs)    Types: Cigarettes    Start date: 10/16/1968    Quit date: 10/16/2013    Years since quitting: 9.9   Smokeless tobacco: Never  Vaping Use   Vaping status: Never Used  Substance and Sexual Activity   Alcohol use: Not Currently    Comment: occasional   Drug use: No   Sexual activity: Not Currently  Other Topics Concern   Not on file  Social History Narrative   Not on file   Social Drivers of Health   Financial Resource Strain: Low Risk  (04/12/2023)   Overall Financial Resource Strain (CARDIA)    Difficulty of Paying Living Expenses: Not hard at all  Food Insecurity: No Food Insecurity (04/09/2023)   Hunger Vital Sign    Worried About Running Out of Food in the Last Year: Never true    Ran Out of Food in the Last Year: Never true  Transportation Needs: No Transportation Needs (04/12/2023)   PRAPARE - Administrator, Civil Service (Medical): No    Lack of Transportation (Non-Medical): No  Physical Activity: Inactive (05/02/2022)   Exercise Vital Sign    Days of Exercise per Week: 0 days    Minutes of Exercise per Session: 0 min  Stress: No Stress Concern Present (05/02/2022)  [employer] of Occupational Health - Occupational Stress Questionnaire    Feeling of Stress : Not at all  Social Connections: Socially Isolated (04/29/2021)   Social Connection and Isolation Panel [NHANES]    Frequency of Communication with Friends and Family: Twice a week    Frequency of Social Gatherings with Friends and Family: Twice a week    Attends Religious Services: Never    Database administrator or Organizations: No    Attends Banker Meetings: Never    Marital Status: Widowed  Intimate Partner Violence: Not At Risk (04/09/2023)   Humiliation, Afraid,  Rape, and Kick questionnaire    Fear of Current or Ex-Partner: No    Emotionally Abused: No    Physically Abused: No    Sexually Abused: No    Family History  Problem Relation Age of Onset   Allergies Mother    Breast cancer Mother    Arthritis Mother    Cancer Mother    Hyperlipidemia Mother    Hypertension Mother    Heart disease Father    Heart disease Brother    Arthritis Brother    Cancer Brother    Depression Brother    Drug abuse Brother    Hyperlipidemia Brother    Heart attack Brother    Rheum arthritis Brother    Cancer Sister    Dementia Sister     Current Outpatient Medications on File Prior to Visit  Medication Sig Dispense Refill   ACCU-CHEK GUIDE TEST test strip USE TO CHECK BLOOD SUGARS 4 TIMES DAILY 100 strip 1   Accu-Chek Softclix Lancets lancets Use to check blood sugars 1-2 times daily. Dx:e11.9 100 each 12   albuterol (VENTOLIN HFA) 108 (90 Base) MCG/ACT inhaler Inhale 1 puff every 4 hours as needed for wheezing or shortness of breath. 18 each 2   aspirin EC 81 MG EC tablet Take 1 tablet (81 mg total) by mouth daily.     atorvastatin (LIPITOR) 80 MG tablet TAKE 1 TABLET BY MOUTH EVERY DAY AT 6PM 90 tablet 0   Blood Glucose Monitoring Suppl (ACCU-CHEK AVIVA PLUS) w/Device KIT Use to test blood sugars 1-2 times daily. Dx: E11.9 1 kit 0   cetirizine (ZYRTEC) 10 MG tablet Take 10 mg by mouth daily.     empagliflozin (JARDIANCE) 10 MG TABS tablet Take 1 tablet (10 mg total) by mouth daily. 90 tablet 1   escitalopram (LEXAPRO) 20 MG tablet TAKE 1 TABLET BY MOUTH EVERY DAY 90 tablet 2   ezetimibe (ZETIA) 10 MG tablet TAKE 1 TABLET BY MOUTH EVERY DAY 30 tablet 0   Famotidine (PEPCID PO) Take 1 tablet by mouth daily.     ferrous gluconate (FERGON) 324 MG tablet TAKE 1 TABLET BY MOUTH TWICE A DAY WITH MEALS 180 tablet 1   furosemide (LASIX) 40 MG tablet Take 1 tablet (40 mg total) by mouth 3 (three) times a week. 28 tablet 1   guaiFENesin (MUCINEX) 600 MG 12 hr  tablet Take 1 tablet (600 mg total) by mouth 2 (two) times daily. 30 tablet 0   losartan (COZAAR) 25 MG tablet Take 0.5 tablets (12.5 mg total) by mouth daily. 45 tablet 3   metFORMIN (GLUCOPHAGE) 500 MG tablet TAKE 1 TABLET BY MOUTH TWICE A DAY WITH FOOD 180 tablet 2   mometasone-formoterol (DULERA) 200-5 MCG/ACT AERO Inhale 2 puffs into the lungs daily. 13 g 2   montelukast (SINGULAIR) 10 MG tablet Take 1 tablet (10 mg total) by  mouth at bedtime. 30 tablet 3   nebivolol (BYSTOLIC) 5 MG tablet TAKE 1 TABLET BY MOUTH EVERY DAY 90 tablet 1   nitroGLYCERIN (NITROSTAT) 0.4 MG SL tablet Place 1 tablet (0.4 mg total) under the tongue every 5 (five) minutes as needed for chest pain. 25 tablet 0   spironolactone (ALDACTONE) 25 MG tablet Take 0.5 tablets (12.5 mg total) by mouth daily. 45 tablet 3   triamcinolone (NASACORT ALLERGY 24HR) 55 MCG/ACT AERO nasal inhaler Place 2 sprays into the nose daily.     vitamin B-12 1000 MCG tablet Take 1 tablet (1,000 mcg total) by mouth daily.     No current facility-administered medications on file prior to visit.    No Known Allergies     Physical Exam Vitals requested from patient and listed below if patient had equipment and was able to obtain at home for this virtual visit: There were no vitals filed for this visit. Estimated body mass index is 33.25 kg/m as calculated from the following:   Height as of 06/16/23: 5\' 1"  (1.549 m).   Weight as of this encounter: 176 lb (79.8 kg).  EKG (optional): deferred due to virtual visit  GENERAL: alert, oriented, no acute distress detected, full vision exam deferred due to pandemic and/or virtual encounter  PSYCH/NEURO: pleasant and cooperative, no obvious depression or anxiety, speech and thought processing grossly intact, Cognitive function grossly intact  Flowsheet Row Office Visit from 09/26/2023 in [facility]  PHQ-9 Total Score 1           09/26/2023   12:38 PM  05/12/2023    2:26 PM 05/02/2022   11:43 AM 03/15/2022   12:59 PM 04/29/2021    1:16 PM  Depression screen PHQ 2/9  Decreased Interest 0 0 0 0 0  Down, Depressed, Hopeless 0 0 0 0 0  PHQ - 2 Score 0 0 0 0 0  Altered sleeping 0      Tired, decreased energy 0      Change in appetite 1      Feeling bad or failure about yourself  0      Trouble concentrating 0      Moving slowly or fidgety/restless 0      Suicidal thoughts 0      PHQ-9 Score 1      Difficult doing work/chores Not difficult at all           01/15/2022    7:39 AM 01/15/2022    8:20 PM 01/16/2022    9:00 AM 05/02/2022   11:43 AM 09/26/2023   12:37 PM  Fall Risk  Falls in the past year?    1 0  Was there an injury with Fall?    0 0  Fall Risk Category Calculator    1 0  Fall Risk Category (Retired)    Low   (RETIRED) Patient Fall Risk Level High fall risk High fall risk Moderate fall risk Moderate fall risk   Patient at Risk for Falls Due to    Impaired balance/gait;Impaired mobility;Medication side effect No Fall Risks  Fall risk Follow up    Falls evaluation completed;Education provided;Falls prevention discussed Falls evaluation completed     SUMMARY AND PLAN:  Encounter for Medicare annual wellness exam   Discussed applicable health maintenance/preventive health measures and advised and referred or ordered per patient preferences: -she declined dexa, lung ca screening, colon ca screening -she agrees to come to see Dr. [place]iland in follow  up for labs, foot exam and to decide pending labs on diabetes treatment -she is considering flu and covid vaccines, discussed risks/benefits, advised could get at pharmacy if decides to get -discussed shingles vaccines as well, she declined for now -she agrees to schedule eye exam herself Health Maintenance  Topic Date Due   OPHTHALMOLOGY EXAM  Never done   Colonoscopy  Never done   Lung Cancer Screening  Never done   Zoster Vaccines- Shingrix (1 of 2) Never done   DEXA SCAN   Never done   Diabetic kidney evaluation - Urine ACR  09/17/2022   INFLUENZA VACCINE  02/23/2023   FOOT EXAM  03/16/2023   COVID-19 Vaccine (1 - 2024-25 season) Never done   DTaP/Tdap/Td (1 - Tdap) 04/30/2024 (Originally 04/23/1965)   HEMOGLOBIN A1C  11/10/2023   Diabetic kidney evaluation - eGFR measurement  06/04/2024   Medicare Annual Wellness (AWV)  09/25/2024   Pneumonia Vaccine 78+ Years old  Completed   Hepatitis C Screening  Completed   HPV VACCINES  Aged [employer] and counseling on the following was provided based on the above review of health and a plan/checklist for the patient, along with additional information discussed, was provided for the patient in the patient instructions :  -Advised and counseled on a healthy lifestyle - including the importance of a healthy diet, regular physical activity, -Reviewed patient's current diet. Advised and counseled on a whole foods based healthy diet. Spent a long time discussing evidence based dietary interventions for diabetes. Advised cutting back/eliminating added sugars/sweeteners and processed grains, increasing veggies, lean and healthy whole proteins, lower sugar fruits, whole grains for starches. A summary of a healthy diet was provided in the Patient Instructions.  -reviewed patient's current physical activity level and discussed exercise guidelines for adults. Discussed community resources and ideas for safe exercise at home to assist in meeting exercise guideline recommendations in a safe and healthy way.  -Advise yearly dental visits at minimum and regular eye exams  Follow up: see patient instructions     Patient Instructions  I really enjoyed getting to talk with you today! I am available on Tuesdays and Thursdays for virtual visits if you have any questions or concerns, or if I can be of any further assistance.   CHECKLIST FROM ANNUAL WELLNESS VISIT:  -Follow up (please call to schedule if not scheduled after  visit):    -call the office today to schedule inperson visit with Dr. Sw[place]iland to check your diabetes and discuss other medication options   -yearly for annual wellness visit with primary care office  Here is a list of your preventive care/health maintenance measures and the plan for each if any are due:  PLAN For any measures below that may be due:  -please let us know if you change you mind about colon cancer screening, lung cancer screening, dexa - we are happy to order -call to schedule your diabetic eye exam with the eye doctor -can get the vaccines at the pharmacy if you wish, please let us know if you do so that we can update your record -can get foot exam and labs when you come for visit with Dr. Swaziland  Health Maintenance  Topic Date Due   OPHTHALMOLOGY EXAM  Never done   Colonoscopy  Never done   Lung Cancer Screening  Never done   Zoster Vaccines- Shingrix (1 of 2) Never done   DEXA SCAN  Never done   Diabetic kidney  evaluation - Urine ACR  09/17/2022   INFLUENZA VACCINE  02/23/2023   FOOT EXAM  03/16/2023   COVID-19 Vaccine (1 - 2024-25 season) Never done   DTaP/Tdap/Td (1 - Tdap) 04/30/2024 (Originally 04/23/1965)   HEMOGLOBIN A1C  11/10/2023   Diabetic kidney evaluation - eGFR measurement  06/04/2024   Medicare Annual Wellness (AWV)  09/25/2024   Pneumonia Vaccine 39+ Years old  Completed   Hepatitis C Screening  Completed   HPV VACCINES  Aged Out    -See a dentist at least yearly  -Get your eyes checked and then per your eye specialist's recommendations  -Other issues addressed today:   -I have included below further information regarding a healthy whole foods based diet, physical activity guidelines for adults, stress management and opportunities for social connections. I hope you find this information useful.    -----------------------------------------------------------------------------------------------------------------------------------------------------------------------------------------------------------------------------------------------------------    NUTRITION: -eat real food: lots of colorful vegetables (half the plate) and fruits -5-7 servings of vegetables and fruits per day (fresh or steamed is best), exp. 2 servings of vegetables with lunch and dinner and 2 servings of fruit per day. Berries and greens such as kale and collards are great choices.  -consume on a regular basis:  fresh fruits, fresh veggies, fish, nuts, seeds, healthy oils (such as olive oil, avocado oil), whole grains (make sure for bread/pasta/crackers/etc., that the first ingredient on label contains the word "whole"), legumes. -can eat small amounts of dairy and lean meat (no larger than the palm of your hand), but avoid processed meats such as ham, bacon, lunch meat, etc. -drink water -try to avoid fast food and pre-packaged foods, processed meat, ultra processed foods/beverages (donuts, candy, etc.) -most experts advise limiting sodium to < 2300mg  per day, should limit further is any chronic conditions such as high blood pressure, heart disease, diabetes, etc. The American Heart Association advised that < 1500mg  is is ideal -try to avoid foods/beverages that contain any ingredients with names you do not recognize  -try to avoid foods/beverages  with added sugar or sweeteners/sweets  -try to avoid sweet drinks (including diet drinks): soda, juice, Gatorade, sweet tea, power drinks, diet drinks -try to avoid white rice, white bread, pasta (unless whole grain)  EXERCISE GUIDELINES FOR ADULTS: -if you wish to increase your physical activity, do so gradually and with the approval of your doctor -STOP and seek medical care immediately if you have any chest pain, chest discomfort or trouble breathing when starting or  increasing exercise  -move and stretch your body, legs, feet and arms when sitting for long periods -Physical activity guidelines for optimal health in adults: -get at least 150 minutes per week of moderate exercise (can talk, but not sing); this is about 20-30 minutes of sustained activity 5-7 days per week or two 10-15 minute episodes of sustained activity 5-7 days per week -do some muscle building/resistance training/strength training at least 2 days per week  -balance exercises 3+ days per week:   Stand somewhere where you have something sturdy to hold onto if you lose balance    1) lift up on toes, then back down, start with 5x per day and work up to 20x   2) stand and lift one leg straight out to the side so that foot is a few inches of the floor, start with 5x each side and work up to 20x each side   3) stand on one foot, start with 5 seconds each side and work up to 20 seconds on each  side  If you need ideas or help with getting more active:  -Silver sneakers https://tools.silversneakers.com  -Walk with a Doc: http://www.duncan-williams.com/  -try to include resistance (weight lifting/strength building) and balance exercises twice per week: or the following link for ideas: http://castillo-powell.com/  BuyDucts.dk  STRESS MANAGEMENT: -can try meditating, or just sitting quietly with deep breathing while intentionally relaxing all parts of your body for 5 minutes daily -if you need further help with stress, anxiety or depression please follow up with your primary doctor or contact the wonderful folks at [facility]: [phone]  SOCIAL CONNECTIONS: -options in [place] if you wish to engage in more social and exercise related activities:  -Silver sneakers https://tools.silversneakers.com  -Walk with a Doc: http://www.duncan-williams.com/  -Check out the [employer] [facility] Active Adults 50+  section on the [place] [employer] (hiking clubs, book clubs, cards and games, chess, exercise classes, aquatic classes and much more) - see the website for details: https://www.[place].gov/departments/parks-recreation/active-adults50  -YouTube has lots of exercise videos for different ages and abilities as well  -[facility] (a variety of indoor and outdoor inperson activities for adults). [phone]. [address].  -Virtual Online Classes (a variety of topics): see seniorplanet.org or call 413-539-0200  -consider volunteering at a school, hospice center, church, senior center or elsewhere            Terressa Koyanagi, DO

## 2023-09-26 NOTE — Patient Instructions (Addendum)
 I really enjoyed getting to talk with you today! I am available on Tuesdays and Thursdays for virtual visits if you have any questions or concerns, or if I can be of any further assistance.   CHECKLIST FROM ANNUAL WELLNESS VISIT:  -Follow up (please call to schedule if not scheduled after visit):    -call the office today to schedule inperson visit with Dr. Swaziland to check your diabetes and discuss other medication options   -yearly for annual wellness visit with primary care office  Here is a list of your preventive care/health maintenance measures and the plan for each if any are due:  PLAN For any measures below that may be due:  -please let us know if you change you mind about colon cancer screening, lung cancer screening, dexa - we are happy to order -call to schedule your diabetic eye exam with the eye doctor -can get the vaccines at the pharmacy if you wish, please let us know if you do so that we can update your record -can get foot exam and labs when you come for visit with Dr. Swaziland  Health Maintenance  Topic Date Due   OPHTHALMOLOGY EXAM  Never done   Colonoscopy  Never done   Lung Cancer Screening  Never done   Zoster Vaccines- Shingrix (1 of 2) Never done   DEXA SCAN  Never done   Diabetic kidney evaluation - Urine ACR  09/17/2022   INFLUENZA VACCINE  02/23/2023   FOOT EXAM  03/16/2023   COVID-19 Vaccine (1 - 2024-25 season) Never done   DTaP/Tdap/Td (1 - Tdap) 04/30/2024 (Originally 04/23/1965)   HEMOGLOBIN A1C  11/10/2023   Diabetic kidney evaluation - eGFR measurement  06/04/2024   Medicare Annual Wellness (AWV)  09/25/2024   Pneumonia Vaccine 63+ Years old  Completed   Hepatitis C Screening  Completed   HPV VACCINES  Aged Out    -See a dentist at least yearly  -Get your eyes checked and then per your eye specialist's recommendations  -Other issues addressed today:   -I have included below further information regarding a healthy whole foods based diet,  physical activity guidelines for adults, stress management and opportunities for social connections. I hope you find this information useful.   -----------------------------------------------------------------------------------------------------------------------------------------------------------------------------------------------------------------------------------------------------------    NUTRITION: -eat real food: lots of colorful vegetables (half the plate) and fruits -5-7 servings of vegetables and fruits per day (fresh or steamed is best), exp. 2 servings of vegetables with lunch and dinner and 2 servings of fruit per day. Berries and greens such as kale and collards are great choices.  -consume on a regular basis:  fresh fruits, fresh veggies, fish, nuts, seeds, healthy oils (such as olive oil, avocado oil), whole grains (make sure for bread/pasta/crackers/etc., that the first ingredient on label contains the word "whole"), legumes. -can eat small amounts of dairy and lean meat (no larger than the palm of your hand), but avoid processed meats such as ham, bacon, lunch meat, etc. -drink water -try to avoid fast food and pre-packaged foods, processed meat, ultra processed foods/beverages (donuts, candy, etc.) -most experts advise limiting sodium to < 2300mg  per day, should limit further is any chronic conditions such as high blood pressure, heart disease, diabetes, etc. The American Heart Association advised that < 1500mg  is is ideal -try to avoid foods/beverages that contain any ingredients with names you do not recognize  -try to avoid foods/beverages  with added sugar or sweeteners/sweets  -try to avoid sweet drinks (including diet drinks):  soda, juice, Gatorade, sweet tea, power drinks, diet drinks -try to avoid white rice, white bread, pasta (unless whole grain)  EXERCISE GUIDELINES FOR ADULTS: -if you wish to increase your physical activity, do so gradually and with the approval  of your doctor -STOP and seek medical care immediately if you have any chest pain, chest discomfort or trouble breathing when starting or increasing exercise  -move and stretch your body, legs, feet and arms when sitting for long periods -Physical activity guidelines for optimal health in adults: -get at least 150 minutes per week of moderate exercise (can talk, but not sing); this is about 20-30 minutes of sustained activity 5-7 days per week or two 10-15 minute episodes of sustained activity 5-7 days per week -do some muscle building/resistance training/strength training at least 2 days per week  -balance exercises 3+ days per week:   Stand somewhere where you have something sturdy to hold onto if you lose balance    1) lift up on toes, then back down, start with 5x per day and work up to 20x   2) stand and lift one leg straight out to the side so that foot is a few inches of the floor, start with 5x each side and work up to 20x each side   3) stand on one foot, start with 5 seconds each side and work up to 20 seconds on each side  If you need ideas or help with getting more active:  -Silver sneakers https://tools.silversneakers.com  -Walk with a Doc: http://www.duncan-williams.com/  -try to include resistance (weight lifting/strength building) and balance exercises twice per week: or the following link for ideas: http://castillo-powell.com/  BuyDucts.dk  STRESS MANAGEMENT: -can try meditating, or just sitting quietly with deep breathing while intentionally relaxing all parts of your body for 5 minutes daily -if you need further help with stress, anxiety or depression please follow up with your primary doctor or contact the wonderful folks at [facility]: [phone]  SOCIAL CONNECTIONS: -options in [place] if you wish to engage in more social and exercise related  activities:  -Silver sneakers https://tools.silversneakers.com  -Walk with a Doc: http://www.duncan-williams.com/  -Check out the [facility] Active Adults 50+ section on the [place] [employer] (hiking clubs, book clubs, cards and games, chess, exercise classes, aquatic classes and much more) - see the website for details: https://www.[place].gov/departments/parks-recreation/active-adults50  -YouTube has lots of exercise videos for different ages and abilities as well  -[facility] (a variety of indoor and outdoor inperson activities for adults). [phone]. [address]  -Virtual Online Classes (a variety of topics): see seniorplanet.org or call [phone]  -consider volunteering at a school, hospice center, church, senior center or elsewhere

## 2023-09-26 NOTE — Progress Notes (Signed)
 Patient unable to obtain vital signs due to telehealth visit

## 2023-10-05 ENCOUNTER — Other Ambulatory Visit: Payer: Self-pay | Admitting: Family Medicine

## 2023-10-05 DIAGNOSIS — J449 Chronic obstructive pulmonary disease, unspecified: Secondary | ICD-10-CM

## 2023-11-06 ENCOUNTER — Ambulatory Visit (INDEPENDENT_AMBULATORY_CARE_PROVIDER_SITE_OTHER): Payer: Medicare Other | Admitting: Cardiology

## 2023-11-06 ENCOUNTER — Encounter (HOSPITAL_BASED_OUTPATIENT_CLINIC_OR_DEPARTMENT_OTHER): Payer: Self-pay | Admitting: Cardiology

## 2023-11-06 VITALS — BP 126/50 | HR 77 | Ht 61.0 in | Wt 179.9 lb

## 2023-11-06 DIAGNOSIS — E785 Hyperlipidemia, unspecified: Secondary | ICD-10-CM | POA: Diagnosis not present

## 2023-11-06 DIAGNOSIS — J9611 Chronic respiratory failure with hypoxia: Secondary | ICD-10-CM

## 2023-11-06 DIAGNOSIS — I255 Ischemic cardiomyopathy: Secondary | ICD-10-CM

## 2023-11-06 DIAGNOSIS — I5020 Unspecified systolic (congestive) heart failure: Secondary | ICD-10-CM

## 2023-11-06 DIAGNOSIS — I2510 Atherosclerotic heart disease of native coronary artery without angina pectoris: Secondary | ICD-10-CM | POA: Diagnosis not present

## 2023-11-06 MED ORDER — DAPAGLIFLOZIN PROPANEDIOL 10 MG PO TABS: 10.0000 mg | ORAL_TABLET | Freq: Every day | ORAL | 11 refills | Status: DC

## 2023-11-06 MED ORDER — EZETIMIBE 10 MG PO TABS: 10.0000 mg | ORAL_TABLET | Freq: Every day | ORAL | 1 refills | Status: AC

## 2023-11-06 MED ORDER — ATORVASTATIN CALCIUM 80 MG PO TABS
ORAL_TABLET | ORAL | 1 refills | Status: AC
Start: 2023-11-06 — End: ?

## 2023-11-06 NOTE — Progress Notes (Signed)
 Cardiology Office Note:  .   Date:  11/06/2023  ID:  Beverly Wheeler, DOB 09-06-1945, MRN [id number] PCP: Swaziland, Betty G, MD  [facility] Providers Cardiologist:  Jodelle Red, MD {  History of Present Illness: Beverly Wheeler   Beverly Wheeler is a 78 y.o. female with complex PMH, including CAD recommended for CABG in 2015, chronic systolic and diastolic heart failure, chronic hypoxic respiratory failure on home O2. I met her during her hospitalization 03/2023.   Pertinent CV history: Presented with late STEMI in 2015. Found to have multivessel CAD, recommended for CABG, but she declined. Initial EF was 30-35%, then improved to 55-60% in 2015, then down to 35-40% in 2019. EF during admission 03/2023 dropped to 25-30%.  Today: Overall feels stable. Notes that she has been struggling as many of her medications are more expensive this year, especially Jardiance. Brings log of home BP and weights, reviewed. Weights stable between 175-180 lbs for the last several months. Taking lasix about three times/week, keeps a good balance on her fluid without dropping her blood pressures.   She has used home O2 since about 2019 but hasn't been seen since 2015 (Dr. Sherene Sires) from what I can see from the chart. Discussed re-referral to pulmonary given her persistent need for home O2, she will consider. Breathing is short chronically, very limited reserve for physical activity.  On med review, does not appear that atorvastatin or ezetimibe has been dispensed by pharmacy. Patient is uncertain as to whether she has been taking this. Due for lipid recheck, but if she has not been taking, we discussed delaying her labs.  ROS: Denies chest pain. No PND, orthopnea, worsening LE edema or unexpected weight gain. No syncope or palpitations. ROS otherwise negative except as noted.   Studies Reviewed: Beverly Wheeler    EKG:       Physical Exam:   VS:  BP (!) 126/50   Pulse 77   Ht 5\' 1"  (1.549 m)   Wt 179 lb 14.4 oz (81.6  kg)   SpO2 97%   BMI 33.99 kg/m    Wt Readings from Last 3 Encounters:  11/06/23 179 lb 14.4 oz (81.6 kg)  09/26/23 176 lb (79.8 kg)  06/16/23 174 lb 12.8 oz (79.3 kg)    GEN: On O2 by nasal cannula HEENT: Normal, moist mucous membranes NECK: No JVD CARDIAC: distant but regular rhythm, normal S1 and S2, no rubs or gallops. No murmur. VASCULAR: Radial and DP pulses 2+ bilaterally. No carotid bruits RESPIRATORY:  Distant breath sounds but largely clear to auscultation without rales, wheezing or rhonchi  ABDOMEN: Soft, non-tender, non-distended MUSCULOSKELETAL:  Ambulates independently with rollator SKIN: Warm and dry, trivial firm bilateral LE edema NEUROLOGIC:  Alert and oriented x 3. No focal neuro deficits noted. PSYCHIATRIC:  Normal affect    ASSESSMENT AND PLAN: .    CAD with MI in 2015 Chronic systolic and diastolic heart failure Ischemic cardiomyopathy Hypercholesterolemia -recommended for CABG in 2015, cath has been extensively reviewed previously and not recommended for PCI -she declined CABG previously, managed medically. However, appears she has not had statin or zetia dispensed from her pharmacy in months. She will confirm this on her home meds. Stressed importance of statin/zetia for managing her LDL -continue aspirin, denies bleeding -she has not been able to afford Jardiance in 2025. With dapagliflozin now generic, will see if this is more affordable for her. She will contact me if cost prohibitive.  -blood pressure has historically limited GDMT. On  losartan 12/5 mg daily, bystolic 5 mg daily, spironolactone 12.5 mg daily -weights, symptoms stable on furosemide 40 mg three times a week -check labs, echo in 3 mos prior to next visit   Chronic hypoxemic respiratory failure, on home O2 COPD -followed with Dr. Waymond Hailey in the past, no recent appt that I can see. Discussed re-referral to pulmonology given persistent use of home O2. She will consider.  Dispo: 3 mos or sooner  as needed  Total time of encounter: I spent 46 minutes dedicated to the care of this patient on the date of this encounter to include pre-visit review of records, face-to-face time with the patient discussing conditions above, and clinical documentation with the electronic health record. We specifically spent time today discussing heart failure symptoms, medications, recommendations for management   Signed, Beverly Donning, MD   Beverly Donning, MD, PhD, [facility]  [facility]  [place]  [facility] at [facility] [address] [place], [place] [address] [phone]

## 2023-11-06 NOTE — Patient Instructions (Addendum)
 Medication Instructions:  Stop Jardiance. Start Farxiga 10 mg ( Take 1 Tablet Daily). Restart Lipitor . Restart Zetia. *If you need a refill on your cardiac medications before your next appointment, please call your pharmacy*  Lab Work: Lipid Panel, Hepatic  Panel : To Be Done In Three (3) months. If you have labs (blood work) drawn today and your tests are completely normal, you will receive your results only by: MyChart Message (if you have MyChart) OR A paper copy in the mail If you have any lab test that is abnormal or we need to change your treatment, we will call you to review the results.  Testing/Procedures: Your physician has requested that you have an echocardiogram. Echocardiography is a painless test that uses sound waves to create images of your heart. It provides your doctor with information about the size and shape of your heart and how well your heart's chambers and valves are working. This procedure takes approximately one hour. There are no restrictions for this procedure. Please do NOT wear cologne, perfume, aftershave, or lotions (deodorant is allowed). Please arrive 15 minutes prior to your appointment time.  Please note: We ask at that you not bring children with you during ultrasound (echo/ vascular) testing. Due to room size and safety concerns, children are not allowed in the ultrasound rooms during exams. Our front office staff cannot provide observation of children in our lobby area while testing is being conducted. An adult accompanying a patient to their appointment will only be allowed in the ultrasound room at the discretion of the ultrasound technician under special circumstances. We apologize for any inconvenience.   Follow-Up: At [facility], you and your health needs are our priority.  As part of our continuing mission to provide you with exceptional heart care, our providers are all part of one team.  This team includes your primary Cardiologist  (physician) and Advanced Practice Providers or APPs (Physician Assistants and Nurse Practitioners) who all work together to provide you with the care you need, when you need it.  Your next appointment:   3 month(s) Post Echo  Provider:   Jodelle Red, MD or Gillian Shields, NP    We recommend signing up for the patient portal called "MyChart".  Sign up information is provided on this After Visit Summary.  MyChart is used to connect with patients for Virtual Visits (Telemedicine).  Patients are able to view lab/test results, encounter notes, upcoming appointments, etc.  Non-urgent messages can be sent to your provider as well.   To learn more about what you can do with MyChart, go to [email or web address].   Other Instructions    Change Jardiance to Marcelline Deist due to cost. If this is still expensive, let me know and we will see what we can help with, or you can try calling the number on your pharmacy card to see if they can split the cost.  Check and see if you are taking the atorvastatin and ezetimibe. It looks like they may not have been filled for some time.   Recheck ultrasound and blood work in 3 mos, before next visit  We discussed re-referral to the pulmonology team as you have not been seen in years and need long term oxygen. Let me know if you are interested.

## 2023-11-07 ENCOUNTER — Other Ambulatory Visit: Payer: Self-pay | Admitting: Family Medicine

## 2023-11-16 ENCOUNTER — Other Ambulatory Visit: Payer: Self-pay | Admitting: Family Medicine

## 2023-11-27 ENCOUNTER — Other Ambulatory Visit (HOSPITAL_BASED_OUTPATIENT_CLINIC_OR_DEPARTMENT_OTHER)

## 2023-12-11 ENCOUNTER — Other Ambulatory Visit (HOSPITAL_BASED_OUTPATIENT_CLINIC_OR_DEPARTMENT_OTHER)

## 2024-01-02 ENCOUNTER — Other Ambulatory Visit: Payer: Self-pay | Admitting: Family Medicine

## 2024-01-03 ENCOUNTER — Other Ambulatory Visit (HOSPITAL_BASED_OUTPATIENT_CLINIC_OR_DEPARTMENT_OTHER)

## 2024-01-05 ENCOUNTER — Other Ambulatory Visit (HOSPITAL_BASED_OUTPATIENT_CLINIC_OR_DEPARTMENT_OTHER): Payer: Self-pay | Admitting: Family

## 2024-01-05 ENCOUNTER — Other Ambulatory Visit (HOSPITAL_BASED_OUTPATIENT_CLINIC_OR_DEPARTMENT_OTHER)

## 2024-02-06 ENCOUNTER — Other Ambulatory Visit (HOSPITAL_BASED_OUTPATIENT_CLINIC_OR_DEPARTMENT_OTHER)

## 2024-03-07 DIAGNOSIS — I5020 Unspecified systolic (congestive) heart failure: Secondary | ICD-10-CM | POA: Diagnosis not present

## 2024-03-08 ENCOUNTER — Other Ambulatory Visit (HOSPITAL_BASED_OUTPATIENT_CLINIC_OR_DEPARTMENT_OTHER)

## 2024-03-08 ENCOUNTER — Ambulatory Visit (HOSPITAL_BASED_OUTPATIENT_CLINIC_OR_DEPARTMENT_OTHER): Admitting: Cardiology

## 2024-03-08 LAB — LIPID PANEL
Chol/HDL Ratio: 6.5 ratio — ABNORMAL HIGH (ref 0.0–4.4)
Cholesterol, Total: 203 mg/dL — ABNORMAL HIGH (ref 100–199)
HDL: 31 mg/dL — ABNORMAL LOW (ref >39)
LDL Chol Calc (NIH): 134 mg/dL — ABNORMAL HIGH (ref 0–99)
Triglycerides: 208 mg/dL — ABNORMAL HIGH (ref 0–149)
VLDL Cholesterol Cal: 38 mg/dL (ref 5–40)

## 2024-03-08 LAB — HEPATIC FUNCTION PANEL
ALT: 7 IU/L (ref 0–32)
AST: 10 IU/L (ref 0–40)
Albumin: 3.6 g/dL — ABNORMAL LOW (ref 3.8–4.8)
Alkaline Phosphatase: 121 IU/L (ref 44–121)
Bilirubin Total: <0.2 mg/dL (ref 0.0–1.2)
Bilirubin, Direct: <0.08 mg/dL (ref 0.00–0.40)
Total Protein: 6.2 g/dL (ref 6.0–8.5)

## 2024-03-18 ENCOUNTER — Ambulatory Visit: Payer: Self-pay

## 2024-03-18 ENCOUNTER — Other Ambulatory Visit: Payer: Self-pay | Admitting: Family Medicine

## 2024-03-18 DIAGNOSIS — E1169 Type 2 diabetes mellitus with other specified complication: Secondary | ICD-10-CM

## 2024-03-18 NOTE — Telephone Encounter (Signed)
 1st attempt. Voicemail box has not been set up, unable to leave message.   Message from [place] C sent at 03/18/2024  3:39 PM EDT  Patient called in stated she is having diarrhea, believes she is having a urine infection and does not feel good. Her appetite is up and down . Would like a nurse to give her a callback regarding this

## 2024-03-18 NOTE — Telephone Encounter (Signed)
 FYI Only or Action Required?: FYI only for provider.  Patient was last seen in primary care on 09/26/2023 by Luke Chiquita SAUNDERS, DO.  Called Nurse Triage reporting Diarrhea.  Symptoms began a week ago.  Interventions attempted: OTC medications: urinary medication.  Symptoms are: gradually worsening.  Triage Disposition: See Physician Within 24 Hours  Patient/caregiver understands and will follow disposition?: Yes   Reason for Disposition  Urinating more frequently than usual (i.e., frequency) OR new-onset of the feeling of an urgent need to urinate (i.e., urgency)  Answer Assessment - Initial Assessment Questions 1. SYMPTOM: What's the main symptom you're concerned about? (e.g., frequency, incontinence)     Increased urine output, burning with urination 2. ONSET: When did the    start?     X 1 week 3. PAIN: Is there any pain? If Yes, ask: How bad is it? (Scale: 1-10; mild, moderate, severe)     moderate 4. CAUSE: What do you think is causing the symptoms?     Possible UTI 5. OTHER SYMPTOMS: Do you have any other symptoms? (e.g., blood in urine, fever, flank pain, pain with urination)     Ithcy-severe, pain with urination 6. PREGNANCY: Is there any chance you are pregnant? When was your last menstrual period?     na  Protocols used: Urinary Symptoms-A-AH

## 2024-03-19 ENCOUNTER — Telehealth: Payer: Self-pay | Admitting: Cardiology

## 2024-03-19 ENCOUNTER — Ambulatory Visit: Admitting: Family Medicine

## 2024-03-19 NOTE — Telephone Encounter (Signed)
 Tried to call patient, no voicemail set up [employer]

## 2024-03-19 NOTE — Telephone Encounter (Signed)
 Heather Paramedic called in to report that pt has had vaginal itching since starting Farxiga . She asked if someone can call the pt back to discuss this side effect.

## 2024-03-20 ENCOUNTER — Emergency Department (HOSPITAL_COMMUNITY)

## 2024-03-20 ENCOUNTER — Ambulatory Visit: Admitting: Family Medicine

## 2024-03-20 ENCOUNTER — Inpatient Hospital Stay (HOSPITAL_COMMUNITY)
Admission: EM | Admit: 2024-03-20 | Discharge: 2024-03-27 | DRG: 493 | Disposition: A | Discharge status: Skilled Nursing Facility | Attending: Internal Medicine | Admitting: Internal Medicine

## 2024-03-20 ENCOUNTER — Encounter (HOSPITAL_COMMUNITY): Payer: Self-pay

## 2024-03-20 ENCOUNTER — Other Ambulatory Visit: Payer: Self-pay

## 2024-03-20 DIAGNOSIS — Z7951 Long term (current) use of inhaled steroids: Secondary | ICD-10-CM | POA: Diagnosis not present

## 2024-03-20 DIAGNOSIS — I5020 Unspecified systolic (congestive) heart failure: Secondary | ICD-10-CM | POA: Diagnosis not present

## 2024-03-20 DIAGNOSIS — Z79899 Other long term (current) drug therapy: Secondary | ICD-10-CM | POA: Diagnosis not present

## 2024-03-20 DIAGNOSIS — R29818 Other symptoms and signs involving the nervous system: Secondary | ICD-10-CM | POA: Diagnosis not present

## 2024-03-20 DIAGNOSIS — Z1152 Encounter for screening for COVID-19: Secondary | ICD-10-CM

## 2024-03-20 DIAGNOSIS — E785 Hyperlipidemia, unspecified: Secondary | ICD-10-CM | POA: Diagnosis present

## 2024-03-20 DIAGNOSIS — R29810 Facial weakness: Secondary | ICD-10-CM | POA: Diagnosis not present

## 2024-03-20 DIAGNOSIS — F32A Depression, unspecified: Secondary | ICD-10-CM | POA: Diagnosis present

## 2024-03-20 DIAGNOSIS — Z9889 Other specified postprocedural states: Secondary | ICD-10-CM | POA: Diagnosis not present

## 2024-03-20 DIAGNOSIS — W1830XA Fall on same level, unspecified, initial encounter: Secondary | ICD-10-CM | POA: Diagnosis present

## 2024-03-20 DIAGNOSIS — E559 Vitamin D deficiency, unspecified: Secondary | ICD-10-CM | POA: Diagnosis not present

## 2024-03-20 DIAGNOSIS — R58 Hemorrhage, not elsewhere classified: Secondary | ICD-10-CM | POA: Diagnosis not present

## 2024-03-20 DIAGNOSIS — S82301A Unspecified fracture of lower end of right tibia, initial encounter for closed fracture: Secondary | ICD-10-CM

## 2024-03-20 DIAGNOSIS — S82301D Unspecified fracture of lower end of right tibia, subsequent encounter for closed fracture with routine healing: Secondary | ICD-10-CM | POA: Diagnosis not present

## 2024-03-20 DIAGNOSIS — I2510 Atherosclerotic heart disease of native coronary artery without angina pectoris: Secondary | ICD-10-CM | POA: Diagnosis present

## 2024-03-20 DIAGNOSIS — D72829 Elevated white blood cell count, unspecified: Secondary | ICD-10-CM | POA: Diagnosis not present

## 2024-03-20 DIAGNOSIS — J9611 Chronic respiratory failure with hypoxia: Secondary | ICD-10-CM | POA: Diagnosis not present

## 2024-03-20 DIAGNOSIS — Z6833 Body mass index (BMI) 33.0-33.9, adult: Secondary | ICD-10-CM

## 2024-03-20 DIAGNOSIS — Z9981 Dependence on supplemental oxygen: Secondary | ICD-10-CM | POA: Diagnosis not present

## 2024-03-20 DIAGNOSIS — N1831 Chronic kidney disease, stage 3a: Secondary | ICD-10-CM | POA: Diagnosis not present

## 2024-03-20 DIAGNOSIS — I130 Hypertensive heart and chronic kidney disease with heart failure and stage 1 through stage 4 chronic kidney disease, or unspecified chronic kidney disease: Secondary | ICD-10-CM | POA: Diagnosis not present

## 2024-03-20 DIAGNOSIS — S91204A Unspecified open wound of right lesser toe(s) with damage to nail, initial encounter: Secondary | ICD-10-CM | POA: Diagnosis not present

## 2024-03-20 DIAGNOSIS — S82871A Displaced pilon fracture of right tibia, initial encounter for closed fracture: Secondary | ICD-10-CM | POA: Diagnosis not present

## 2024-03-20 DIAGNOSIS — Z7901 Long term (current) use of anticoagulants: Secondary | ICD-10-CM | POA: Diagnosis not present

## 2024-03-20 DIAGNOSIS — E66811 Obesity, class 1: Secondary | ICD-10-CM | POA: Diagnosis present

## 2024-03-20 DIAGNOSIS — D509 Iron deficiency anemia, unspecified: Secondary | ICD-10-CM | POA: Diagnosis not present

## 2024-03-20 DIAGNOSIS — W1830XD Fall on same level, unspecified, subsequent encounter: Secondary | ICD-10-CM | POA: Diagnosis not present

## 2024-03-20 DIAGNOSIS — R29702 NIHSS score 2: Secondary | ICD-10-CM | POA: Diagnosis not present

## 2024-03-20 DIAGNOSIS — I959 Hypotension, unspecified: Secondary | ICD-10-CM | POA: Diagnosis not present

## 2024-03-20 DIAGNOSIS — Z7984 Long term (current) use of oral hypoglycemic drugs: Secondary | ICD-10-CM

## 2024-03-20 DIAGNOSIS — R6889 Other general symptoms and signs: Secondary | ICD-10-CM | POA: Diagnosis not present

## 2024-03-20 DIAGNOSIS — R410 Disorientation, unspecified: Secondary | ICD-10-CM | POA: Diagnosis not present

## 2024-03-20 DIAGNOSIS — Z87891 Personal history of nicotine dependence: Secondary | ICD-10-CM

## 2024-03-20 DIAGNOSIS — I6523 Occlusion and stenosis of bilateral carotid arteries: Secondary | ICD-10-CM | POA: Diagnosis not present

## 2024-03-20 DIAGNOSIS — G9340 Encephalopathy, unspecified: Secondary | ICD-10-CM | POA: Diagnosis not present

## 2024-03-20 DIAGNOSIS — M8580 Other specified disorders of bone density and structure, unspecified site: Secondary | ICD-10-CM | POA: Diagnosis not present

## 2024-03-20 DIAGNOSIS — E1122 Type 2 diabetes mellitus with diabetic chronic kidney disease: Secondary | ICD-10-CM | POA: Diagnosis present

## 2024-03-20 DIAGNOSIS — Z7189 Other specified counseling: Secondary | ICD-10-CM | POA: Diagnosis not present

## 2024-03-20 DIAGNOSIS — E7220 Disorder of urea cycle metabolism, unspecified: Secondary | ICD-10-CM | POA: Diagnosis not present

## 2024-03-20 DIAGNOSIS — M19071 Primary osteoarthritis, right ankle and foot: Secondary | ICD-10-CM | POA: Diagnosis not present

## 2024-03-20 DIAGNOSIS — R638 Other symptoms and signs concerning food and fluid intake: Secondary | ICD-10-CM | POA: Diagnosis not present

## 2024-03-20 DIAGNOSIS — R278 Other lack of coordination: Secondary | ICD-10-CM | POA: Diagnosis not present

## 2024-03-20 DIAGNOSIS — S82431A Displaced oblique fracture of shaft of right fibula, initial encounter for closed fracture: Secondary | ICD-10-CM | POA: Diagnosis not present

## 2024-03-20 DIAGNOSIS — M799 Soft tissue disorder, unspecified: Secondary | ICD-10-CM | POA: Diagnosis not present

## 2024-03-20 DIAGNOSIS — Z8249 Family history of ischemic heart disease and other diseases of the circulatory system: Secondary | ICD-10-CM

## 2024-03-20 DIAGNOSIS — K219 Gastro-esophageal reflux disease without esophagitis: Secondary | ICD-10-CM | POA: Diagnosis present

## 2024-03-20 DIAGNOSIS — Z743 Need for continuous supervision: Secondary | ICD-10-CM | POA: Diagnosis not present

## 2024-03-20 DIAGNOSIS — E875 Hyperkalemia: Secondary | ICD-10-CM | POA: Diagnosis not present

## 2024-03-20 DIAGNOSIS — I10 Essential (primary) hypertension: Secondary | ICD-10-CM | POA: Diagnosis not present

## 2024-03-20 DIAGNOSIS — M6281 Muscle weakness (generalized): Secondary | ICD-10-CM | POA: Diagnosis not present

## 2024-03-20 DIAGNOSIS — J45909 Unspecified asthma, uncomplicated: Secondary | ICD-10-CM | POA: Diagnosis not present

## 2024-03-20 DIAGNOSIS — F39 Unspecified mood [affective] disorder: Secondary | ICD-10-CM | POA: Diagnosis present

## 2024-03-20 DIAGNOSIS — S82401A Unspecified fracture of shaft of right fibula, initial encounter for closed fracture: Secondary | ICD-10-CM | POA: Diagnosis not present

## 2024-03-20 DIAGNOSIS — I252 Old myocardial infarction: Secondary | ICD-10-CM

## 2024-03-20 DIAGNOSIS — Z8261 Family history of arthritis: Secondary | ICD-10-CM

## 2024-03-20 DIAGNOSIS — Z7982 Long term (current) use of aspirin: Secondary | ICD-10-CM

## 2024-03-20 DIAGNOSIS — S82891A Other fracture of right lower leg, initial encounter for closed fracture: Secondary | ICD-10-CM | POA: Diagnosis not present

## 2024-03-20 DIAGNOSIS — S82831A Other fracture of upper and lower end of right fibula, initial encounter for closed fracture: Secondary | ICD-10-CM | POA: Diagnosis not present

## 2024-03-20 DIAGNOSIS — N183 Chronic kidney disease, stage 3 unspecified: Secondary | ICD-10-CM | POA: Diagnosis not present

## 2024-03-20 DIAGNOSIS — S82891D Other fracture of right lower leg, subsequent encounter for closed fracture with routine healing: Secondary | ICD-10-CM | POA: Diagnosis not present

## 2024-03-20 DIAGNOSIS — F05 Delirium due to known physiological condition: Secondary | ICD-10-CM | POA: Diagnosis not present

## 2024-03-20 DIAGNOSIS — E876 Hypokalemia: Secondary | ICD-10-CM | POA: Diagnosis not present

## 2024-03-20 DIAGNOSIS — F419 Anxiety disorder, unspecified: Secondary | ICD-10-CM | POA: Diagnosis present

## 2024-03-20 DIAGNOSIS — M7731 Calcaneal spur, right foot: Secondary | ICD-10-CM | POA: Diagnosis not present

## 2024-03-20 DIAGNOSIS — I5022 Chronic systolic (congestive) heart failure: Secondary | ICD-10-CM | POA: Diagnosis present

## 2024-03-20 DIAGNOSIS — R2689 Other abnormalities of gait and mobility: Secondary | ICD-10-CM | POA: Diagnosis not present

## 2024-03-20 DIAGNOSIS — E119 Type 2 diabetes mellitus without complications: Secondary | ICD-10-CM | POA: Diagnosis not present

## 2024-03-20 DIAGNOSIS — Z813 Family history of other psychoactive substance abuse and dependence: Secondary | ICD-10-CM

## 2024-03-20 DIAGNOSIS — E782 Mixed hyperlipidemia: Secondary | ICD-10-CM | POA: Diagnosis not present

## 2024-03-20 DIAGNOSIS — S82891G Other fracture of right lower leg, subsequent encounter for closed fracture with delayed healing: Secondary | ICD-10-CM | POA: Diagnosis not present

## 2024-03-20 DIAGNOSIS — J4489 Other specified chronic obstructive pulmonary disease: Secondary | ICD-10-CM | POA: Diagnosis not present

## 2024-03-20 DIAGNOSIS — J309 Allergic rhinitis, unspecified: Secondary | ICD-10-CM | POA: Diagnosis not present

## 2024-03-20 DIAGNOSIS — S99911A Unspecified injury of right ankle, initial encounter: Secondary | ICD-10-CM | POA: Diagnosis not present

## 2024-03-20 DIAGNOSIS — Z803 Family history of malignant neoplasm of breast: Secondary | ICD-10-CM

## 2024-03-20 DIAGNOSIS — W19XXXA Unspecified fall, initial encounter: Secondary | ICD-10-CM | POA: Diagnosis not present

## 2024-03-20 DIAGNOSIS — I639 Cerebral infarction, unspecified: Secondary | ICD-10-CM | POA: Diagnosis not present

## 2024-03-20 DIAGNOSIS — Z83438 Family history of other disorder of lipoprotein metabolism and other lipidemia: Secondary | ICD-10-CM

## 2024-03-20 DIAGNOSIS — J441 Chronic obstructive pulmonary disease with (acute) exacerbation: Secondary | ICD-10-CM | POA: Diagnosis not present

## 2024-03-20 DIAGNOSIS — Z818 Family history of other mental and behavioral disorders: Secondary | ICD-10-CM

## 2024-03-20 DIAGNOSIS — I5040 Unspecified combined systolic (congestive) and diastolic (congestive) heart failure: Secondary | ICD-10-CM | POA: Diagnosis not present

## 2024-03-20 HISTORY — DX: Type 2 diabetes mellitus without complications: E11.9

## 2024-03-20 LAB — CBC WITH DIFFERENTIAL/PLATELET
Abs Immature Granulocytes: 0.09 K/uL — ABNORMAL HIGH (ref 0.00–0.07)
Basophils Absolute: 0.1 K/uL (ref 0.0–0.1)
Basophils Relative: 1 %
Eosinophils Absolute: 0.1 K/uL (ref 0.0–0.5)
Eosinophils Relative: 1 %
HCT: 29.4 % — ABNORMAL LOW (ref 36.0–46.0)
Hemoglobin: 9.1 g/dL — ABNORMAL LOW (ref 12.0–15.0)
Immature Granulocytes: 1 %
Lymphocytes Relative: 13 %
Lymphs Abs: 1.7 K/uL (ref 0.7–4.0)
MCH: 30.5 pg (ref 26.0–34.0)
MCHC: 31 g/dL (ref 30.0–36.0)
MCV: 98.7 fL (ref 80.0–100.0)
Monocytes Absolute: 0.6 K/uL (ref 0.1–1.0)
Monocytes Relative: 4 %
Neutro Abs: 10.5 K/uL — ABNORMAL HIGH (ref 1.7–7.7)
Neutrophils Relative %: 80 %
Platelets: 221 K/uL (ref 150–400)
RBC: 2.98 MIL/uL — ABNORMAL LOW (ref 3.87–5.11)
RDW: 13.2 % (ref 11.5–15.5)
WBC: 13.1 K/uL — ABNORMAL HIGH (ref 4.0–10.5)
nRBC: 0 % (ref 0.0–0.2)

## 2024-03-20 LAB — COMPREHENSIVE METABOLIC PANEL WITH GFR
ALT: 10 U/L (ref 0–44)
AST: 16 U/L (ref 15–41)
Albumin: 3 g/dL — ABNORMAL LOW (ref 3.5–5.0)
Alkaline Phosphatase: 104 U/L (ref 38–126)
Anion gap: 11 (ref 5–15)
BUN: 14 mg/dL (ref 8–23)
CO2: 32 mmol/L (ref 22–32)
Calcium: 9 mg/dL (ref 8.9–10.3)
Chloride: 97 mmol/L — ABNORMAL LOW (ref 98–111)
Creatinine, Ser: 1.56 mg/dL — ABNORMAL HIGH (ref 0.44–1.00)
GFR, Estimated: 34 mL/min — ABNORMAL LOW (ref >60)
Glucose, Bld: 113 mg/dL — ABNORMAL HIGH (ref 70–99)
Potassium: 4.7 mmol/L (ref 3.5–5.1)
Sodium: 140 mmol/L (ref 135–145)
Total Bilirubin: 0.2 mg/dL (ref 0.0–1.2)
Total Protein: 6.6 g/dL (ref 6.5–8.1)

## 2024-03-20 MED ORDER — SODIUM CHLORIDE 0.9% FLUSH
3.0000 mL | Freq: Two times a day (BID) | INTRAVENOUS | Status: DC
  Administered 2024-03-20 – 2024-03-27 (×14): 3 mL via INTRAVENOUS

## 2024-03-20 MED ORDER — GLUCERNA SHAKE PO LIQD
237.0000 mL | Freq: Once | ORAL | Status: AC
  Administered 2024-03-20: 237 mL via ORAL
  Filled 2024-03-20: qty 237

## 2024-03-20 MED ORDER — ETOMIDATE 2 MG/ML IV SOLN: 10.0000 mg | Freq: Once | INTRAVENOUS | Status: DC

## 2024-03-20 MED ORDER — NEBIVOLOL HCL 5 MG PO TABS
5.0000 mg | ORAL_TABLET | Freq: Every day | ORAL | Status: DC
  Administered 2024-03-21 – 2024-03-27 (×7): 5 mg via ORAL
  Filled 2024-03-20 (×7): qty 1

## 2024-03-20 MED ORDER — POLYETHYLENE GLYCOL 3350 17 G PO PACK: 17.0000 g | PACK | Freq: Every day | ORAL | Status: DC | PRN

## 2024-03-20 MED ORDER — FLUTICASONE FUROATE-VILANTEROL 100-25 MCG/ACT IN AEPB
1.0000 | INHALATION_SPRAY | Freq: Every day | RESPIRATORY_TRACT | Status: DC
  Administered 2024-03-22 – 2024-03-27 (×5): 1 via RESPIRATORY_TRACT
  Filled 2024-03-20 (×2): qty 28

## 2024-03-20 MED ORDER — ATORVASTATIN CALCIUM 80 MG PO TABS
80.0000 mg | ORAL_TABLET | Freq: Every day | ORAL | Status: DC
  Administered 2024-03-20 – 2024-03-26 (×7): 80 mg via ORAL
  Filled 2024-03-20 (×7): qty 1

## 2024-03-20 MED ORDER — ENSURE SURGERY PO LIQD: 237.0000 mL | Freq: Once | ORAL | Status: DC

## 2024-03-20 MED ORDER — IPRATROPIUM-ALBUTEROL 0.5-2.5 (3) MG/3ML IN SOLN: 3.0000 mL | Freq: Four times a day (QID) | RESPIRATORY_TRACT | Status: DC | PRN

## 2024-03-20 MED ORDER — LORATADINE 10 MG PO TABS
10.0000 mg | ORAL_TABLET | Freq: Every day | ORAL | Status: DC | PRN
  Administered 2024-03-25: 10 mg via ORAL
  Filled 2024-03-20: qty 1

## 2024-03-20 MED ORDER — ALBUTEROL SULFATE (2.5 MG/3ML) 0.083% IN NEBU: 2.5000 mg | INHALATION_SOLUTION | RESPIRATORY_TRACT | Status: DC | PRN

## 2024-03-20 MED ORDER — TRANEXAMIC ACID-NACL 1000-0.7 MG/100ML-% IV SOLN
1000.0000 mg | INTRAVENOUS | Status: AC
  Administered 2024-03-21: 1000 mg via INTRAVENOUS
  Filled 2024-03-20: qty 100

## 2024-03-20 MED ORDER — OXYCODONE HCL 5 MG PO TABS
5.0000 mg | ORAL_TABLET | ORAL | Status: DC | PRN
  Administered 2024-03-21: 5 mg via ORAL
  Filled 2024-03-20 (×3): qty 1

## 2024-03-20 MED ORDER — CEFAZOLIN SODIUM-DEXTROSE 2-4 GM/100ML-% IV SOLN
2.0000 g | INTRAVENOUS | Status: AC
  Administered 2024-03-21: 2 g via INTRAVENOUS
  Filled 2024-03-20: qty 100

## 2024-03-20 MED ORDER — FAMOTIDINE 20 MG PO TABS: 20.0000 mg | ORAL_TABLET | Freq: Every day | ORAL | Status: DC | PRN

## 2024-03-20 MED ORDER — VITAMIN B-12 1000 MCG PO TABS
1000.0000 ug | ORAL_TABLET | Freq: Every day | ORAL | Status: DC
  Administered 2024-03-21 – 2024-03-27 (×7): 1000 ug via ORAL
  Filled 2024-03-20 (×7): qty 1

## 2024-03-20 MED ORDER — FERROUS GLUCONATE 324 (38 FE) MG PO TABS
324.0000 mg | ORAL_TABLET | Freq: Two times a day (BID) | ORAL | Status: DC
  Administered 2024-03-21 – 2024-03-27 (×12): 324 mg via ORAL
  Filled 2024-03-20 (×14): qty 1

## 2024-03-20 MED ORDER — HYDROMORPHONE HCL 1 MG/ML IJ SOLN
0.5000 mg | INTRAMUSCULAR | Status: DC | PRN
  Filled 2024-03-20: qty 0.5

## 2024-03-20 MED ORDER — ACETAMINOPHEN 500 MG PO TABS
1000.0000 mg | ORAL_TABLET | Freq: Four times a day (QID) | ORAL | Status: DC | PRN
Start: 2024-03-20 — End: 2024-03-24
  Administered 2024-03-21 – 2024-03-24 (×5): 1000 mg via ORAL
  Filled 2024-03-20 (×6): qty 2

## 2024-03-20 MED ORDER — MONTELUKAST SODIUM 10 MG PO TABS
10.0000 mg | ORAL_TABLET | Freq: Every day | ORAL | Status: AC
Start: 2024-03-20 — End: ?
  Administered 2024-03-20 – 2024-03-26 (×7): 10 mg via ORAL
  Filled 2024-03-20 (×7): qty 1

## 2024-03-20 MED ORDER — ONDANSETRON HCL 4 MG/2ML IJ SOLN: 4.0000 mg | Freq: Four times a day (QID) | INTRAMUSCULAR | Status: DC | PRN

## 2024-03-20 MED ORDER — HYDROMORPHONE HCL 1 MG/ML IJ SOLN
1.0000 mg | Freq: Once | INTRAMUSCULAR | Status: AC
  Administered 2024-03-20: 1 mg via INTRAVENOUS
  Filled 2024-03-20: qty 1

## 2024-03-20 MED ORDER — SPIRONOLACTONE 12.5 MG HALF TABLET
12.5000 mg | ORAL_TABLET | Freq: Every day | ORAL | Status: DC
  Administered 2024-03-21 – 2024-03-27 (×7): 12.5 mg via ORAL
  Filled 2024-03-20 (×7): qty 1

## 2024-03-20 MED ORDER — OXYCODONE HCL 5 MG PO TABS: 2.5000 mg | ORAL_TABLET | ORAL | Status: DC | PRN

## 2024-03-20 MED ORDER — EZETIMIBE 10 MG PO TABS
10.0000 mg | ORAL_TABLET | Freq: Every day | ORAL | Status: DC
  Administered 2024-03-21 – 2024-03-27 (×7): 10 mg via ORAL
  Filled 2024-03-20 (×7): qty 1

## 2024-03-20 MED ORDER — BUPIVACAINE HCL 0.5 % IJ SOLN
20.0000 mL | Freq: Once | INTRAMUSCULAR | Status: AC
  Administered 2024-03-20: 20 mL
  Filled 2024-03-20: qty 50

## 2024-03-20 MED ORDER — BACITRACIN ZINC 500 UNIT/GM EX OINT
TOPICAL_OINTMENT | Freq: Once | CUTANEOUS | Status: AC
  Filled 2024-03-20: qty 0.9

## 2024-03-20 MED ORDER — MELATONIN 3 MG PO TABS: 6.0000 mg | ORAL_TABLET | Freq: Every evening | ORAL | Status: DC | PRN

## 2024-03-20 MED ORDER — ONDANSETRON HCL 4 MG/2ML IJ SOLN
4.0000 mg | Freq: Once | INTRAMUSCULAR | Status: DC
  Filled 2024-03-20: qty 2

## 2024-03-20 MED ORDER — ESCITALOPRAM OXALATE 10 MG PO TABS
20.0000 mg | ORAL_TABLET | Freq: Every day | ORAL | Status: DC
  Administered 2024-03-21 – 2024-03-27 (×6): 20 mg via ORAL
  Filled 2024-03-20 (×7): qty 2

## 2024-03-20 NOTE — Progress Notes (Signed)
 Orthopedic Tech Progress Note Patient Details:  Beverly Wheeler 26-Oct-1945 [id number]  Ortho Devices Type of Ortho Device: Short leg splint Ortho Device/Splint Location: RLE Ortho Device/Splint Interventions: Ordered, Application, Adjustment  Called to apply splint with reduction, applied short leg with stirrup splint.  Post Interventions Patient Tolerated: Fair Instructions Provided: Other (comment)  [name] 03/20/2024, 7:27 PM

## 2024-03-20 NOTE — ED Provider Notes (Addendum)
  Physical Exam  BP (!) 142/53   Pulse 67   Temp 98.2 F (36.8 C) (Oral)   Resp 20   Ht 5' 1 (1.549 m)   Wt 80.3 kg   SpO2 95%   BMI 33.44 kg/m   Physical Exam  Procedures  .Reduction of fracture  Date/Time: 03/20/2024 7:21 PM  Performed by: Jerrol Agent, MD Authorized by: Jerrol Agent, MD  Consent: Verbal consent obtained. Written consent obtained Risks and benefits: risks, benefits and alternatives were discussed Consent given by: patient Required items: required blood products, implants, devices, and special equipment available Patient identity confirmed: arm band Time out: Immediately prior to procedure a time out was called to verify the correct patient, procedure, equipment, support staff and site/side marked as required. Preparation: Patient was prepped and draped in the usual sterile fashion. Local anesthesia used: yes Anesthesia: hematoma block  Anesthesia: Local anesthesia used: yes Local Anesthetic: bupivacaine  0.5% without epinephrine  Anesthetic total: 10 mL  Sedation: Patient sedated: no  Patient tolerance: patient tolerated the procedure well with no immediate complications     ED Course / MDM    Medical Decision Making Amount and/or Complexity of Data Reviewed Labs: ordered. Radiology: ordered.  Risk OTC drugs. Prescription drug management. Decision regarding hospitalization.   3F presenting to the ED after a mechanical fall, found to have a trimalleolar ankle fracture on XR imaging. Will need reduction of fractures, ankle mortisse appears intact.  XR: IMPRESSION:  Acute fractures of the distal right tibia and fibula, as described  above.     Reduction performed bedside with hematoma block and IV Dilaudid . Repeat XR of the ankle obtain and orthopedics consulted.  XR Right ankle: IMPRESSION:  Slight interval decrease in the angulation of the distal tibial and  fibular fractures.   Spoke with Dr. Georgina of orthopedics who  recommended medicine admission, plan for likely n.p.o. at midnight and surgery in the a.m. medicine consulted for admission, Dr. Renold accepting.    Jerrol Agent, MD 03/20/24 DURENDA    Jerrol Agent, MD 03/20/24 2055

## 2024-03-20 NOTE — ED Notes (Signed)
 Patient consent form signed. All patient questioned clarified by Jerrol, MD.

## 2024-03-20 NOTE — Consult Note (Incomplete)
 Orthopedic Surgery Consult Note  Assessment: Patient is a 78 y.o. female with right, closed pilon and tibial shaft fractures    Plan: -Operative plans: planning for operative fixation today -Diet: NPO for procedure -DVT ppx: aspirin  81mg  BID post-operatively -Ancef  and TXA on call to OR -Weight bearing status: NWB RLE in splint -PT evaluate and treat post-op -Pain control -Dispo: pending completion of operative plans   Discussed recommendation for operative intervention in the form of right pilon and tibial shaft fracture open reduction internal fixation. Explained the risks of this procedure included, but were not limited to: nonunion, malunion, hardware failure, infection, bleeding, stiffness, post traumatic arthritis, need for additional procedures, deep vein thrombosis, pulmonary embolism, and death. The benefits of this procedure would be to promote fracture healing by providing stability and to heal the fracture in the appropriate alignment. The alternatives of this surgery would be to treat the fracture with immobilization in a splint/brace/cast or to do no intervention. The patient's questions were answered to her satisfaction. After this discussion, patient elected to proceed with surgery. Informed consent was obtained.   ___________________________________________________________________________   Reason for consult: right pilon and tibial shaft fracture  History:  Patient is a 78 y.o. female who had a ground level fall and noted immediate onset of right ankle pain. She was unable to ambulate and presented to the ER. She was found to have a right tibial shaft and pilon fracture. She was admitted to medicine. She is reporting right ankle pain this morning. No other extremity pain.   Review of systems: General: denies fevers and chills, myalgias Neurologic: denies recent changes in vision, slurred speech Abdomen: denies nausea, vomiting, hematemesis Respiratory: denies cough,  shortness of breath  Past medical history:  COPD CHF CAD DM GERD HLD HTN CKD  Allergies: NKDA   Past surgical history: Cardiac catheterization   Social history: Denies use of nicotine-containing products (cigarettes, vaping, smokeless, etc.) Alcohol use: denies Denies use of recreational drugs  Family history: -reviewed and not pertinent to pilon and distal 1/3 tibia fracture   Physical Exam:  BMI of 33.4  General: no acute distress, appears stated age Neurologic: alert, answering questions appropriately, following commands Cardiovascular: regular rate, no cyanosis Respiratory: unlabored breathing on room air, symmetric chest rise Psychiatric: appropriate affect, normal cadence to speech  MSK:   -Bilateral upper extremities  No tenderness to palpation over extremity, no gross deformity, no open wounds Fires deltoid, biceps, triceps, wrist extensors, wrist flexors, finger extensors, finger flexors  AIN/PIN/IO intact  Palpable radial pulse  Sensation intact to light touch in median/ulnar/radial/axillary nerve distributions  Hand warm and well perfused  -Left lower extremity  No tenderness to palpation over extremity, no gross deformity, no open wounds Fires hip flexors, quadriceps, hamstrings, tibialis anterior, gastrocnemius and soleus, extensor hallucis longus Plantarflexes and dorsiflexes toes Sensation intact to light touch in sural, saphenous, tibial, deep peroneal, and superficial peroneal nerve distributions Foot warm and well perfused  -Right lower extremity  TTP over the ankle and foot, no other tenderness to palpation over the remainder of the extremity, no pain with log roll, no open wounds Fires hip flexors, quadriceps, hamstrings, tibialis anterior, gastrocnemius and soleus, extensor hallucis longus Plantarflexes and dorsiflexes toes Sensation intact to light touch in sural, saphenous, tibial, deep peroneal, and superficial peroneal nerve  distributions Foot warm and well perfused  Imaging: CT of the right ankle from 03/20/2024 was independently reviewed and interpreted, showing a articular plafond fracture in the middle of the joint.  This fracture extends to the metaphysis. There is also a transverse fracture of the distal tibia.     Patient name: Beverly Wheeler Patient MRN: [id number] Date: 03/21/2024

## 2024-03-20 NOTE — Progress Notes (Signed)
 Transition of Care [facility]) - CAGE-AID Screening   Patient Details  Name: Beverly Wheeler MRN: [id number] Date of Birth: August 18, 1945  Transition of Care [facility]) CM/SW Contact:    Sallyanne MALVA Mettle, RN Phone Number: 03/20/2024, 8:56 PM   Clinical Narrative:  Denies alcohol and drug use, no resources indicated.  CAGE-AID Screening:    Have You Ever Felt You Ought to Cut Down on Your Drinking or Drug Use?: No Have People Annoyed You By Critizing Your Drinking Or Drug Use?: No Have You Felt Bad Or Guilty About Your Drinking Or Drug Use?: No Have You Ever Had a Drink or Used Drugs First Thing In The Morning to Steady Your Nerves or to Get Rid of a Hangover?: No CAGE-AID Score: 0  Substance Abuse Education Offered: No

## 2024-03-20 NOTE — ED Provider Notes (Signed)
 [facility] AT [facility] Provider Note   CSN: [id number] Arrival date & time: 03/20/24  1507     Patient presents with: [place]   Beverly Wheeler is a 78 y.o. female.   Pt is a 78 yo female with pmhx significant for HTN, COPD, HLD, CAD, asthma, depression, and GERD.  Pt has been having pain in her right ankle with difficulty walking.  She tried using a brace on her ankle and walking with her walker.  She felt a pop in her ankle and fell.  She now has worsening pain in her right ankle with an avulsion of her right small toe.  Pt did not hurt herself otherwise when she fell.  She is not on thinners.         Prior to Admission medications   Medication Sig Start Date End Date Taking? Authorizing Provider  Accu-Chek Softclix Lancets lancets Use to check blood sugars 1-2 times daily. Dx:e11.9 05/04/20   [place], Betty G, MD  albuterol  (VENTOLIN  HFA) 108 [id number] Base) MCG/ACT inhaler INHALE 1 PUFF EVERY 4 HOURS AS NEEDED FOR WHEEZING OR SHORTNESS OF BREATH. 10/09/23   [place], Betty G, MD  aspirin  EC 81 MG EC tablet Take 1 tablet (81 mg total) by mouth daily. 10/21/13   Barrett, Shona MATSU, PA-C  atorvastatin  (LIPITOR ) 80 MG tablet TAKE 1 TABLET BY MOUTH EVERY DAY AT JEREL 11/06/23   Lonni Slain, MD  Blood Glucose Monitoring Suppl (ACCU-CHEK AVIVA PLUS) w/Device KIT Use to test blood sugars 1-2 times daily. Dx: E11.9 05/04/20   [place], Betty G, MD  cetirizine (ZYRTEC) 10 MG tablet Take 10 mg by mouth daily.    [provider]  dapagliflozin  propanediol (FARXIGA ) 10 MG TABS tablet Take 1 tablet (10 mg total) by mouth daily. 11/06/23   Lonni Slain, MD  escitalopram  (LEXAPRO ) 20 MG tablet TAKE 1 TABLET BY MOUTH EVERY DAY 06/26/23   [place], Betty G, MD  ezetimibe  (ZETIA ) 10 MG tablet Take 1 tablet (10 mg total) by mouth daily. 11/06/23   Lonni Slain, MD  Famotidine  (PEPCID  PO) Take 1 tablet by mouth daily.    [provider]   ferrous gluconate  (FERGON) 324 MG tablet TAKE 1 TABLET BY MOUTH TWICE A DAY WITH MEALS 08/09/19   [place], Betty G, MD  furosemide  (LASIX ) 40 MG tablet TAKE 1 TABLET (40 MG TOTAL) BY MOUTH 3 (THREE) TIMES A WEEK. 01/05/24   Lonni Slain, MD  glucose blood (ACCU-CHEK GUIDE TEST) test strip USE TO CHECK BLOOD SUGARS 4 TIMES DAILY 01/02/24   [place], Betty G, MD  guaiFENesin  (MUCINEX ) 600 MG 12 hr tablet Take 1 tablet (600 mg total) by mouth 2 (two) times daily. 04/14/23   Regalado, Belkys A, MD  losartan  (COZAAR ) 25 MG tablet Take 0.5 tablets (12.5 mg total) by mouth daily. 05/09/23   Colletta Manuelita Garre, PA-C  metFORMIN  (GLUCOPHAGE ) 500 MG tablet TAKE 1 TABLET BY MOUTH TWICE A DAY WITH FOOD 03/18/24   [place], Betty G, MD  mometasone -formoterol  (DULERA ) 200-5 MCG/ACT AERO Inhale 2 puffs into the lungs daily. 08/07/23   [place], Betty G, MD  montelukast  (SINGULAIR ) 10 MG tablet Take 1 tablet (10 mg total) by mouth at bedtime. 03/25/20   [place], Betty G, MD  nebivolol  (BYSTOLIC ) 5 MG tablet TAKE 1 TABLET BY MOUTH EVERY DAY 11/16/23   [place], Betty G, MD  nitroGLYCERIN  (NITROSTAT ) 0.4 MG SL tablet Place 1 tablet (0.4 mg total) under the tongue every 5 (five)  minutes as needed for chest pain. 12/20/22   [place], Betty G, MD  spironolactone  (ALDACTONE ) 25 MG tablet Take 0.5 tablets (12.5 mg total) by mouth daily. 05/09/23   Colletta Manuelita Garre, PA-C  triamcinolone  (NASACORT  ALLERGY 24HR) 55 MCG/ACT AERO nasal inhaler Place 2 sprays into the nose daily.    [provider]  vitamin B-12 1000 MCG tablet Take 1 tablet (1,000 mcg total) by mouth daily. 12/06/17   Singh, Prashant K, MD    Allergies: Patient has no known allergies.    Review of Systems  Musculoskeletal:        Right ankle and right small toe pain  All other systems reviewed and are negative.   Updated Vital Signs BP (!) 142/53   Pulse 67   Temp 98.2 F (36.8 C) (Oral)   Resp 20   Ht 5' 1 (1.549 m)   Wt 80.3 kg   SpO2 95%    BMI 33.44 kg/m   Physical Exam Vitals and nursing note reviewed.  Constitutional:      Appearance: Normal appearance. She is obese.  HENT:     Head: Normocephalic and atraumatic.     Right Ear: External ear normal.     Left Ear: External ear normal.     Nose: Nose normal.     Mouth/Throat:     Mouth: Mucous membranes are moist.     Pharynx: Oropharynx is clear.  Eyes:     Extraocular Movements: Extraocular movements intact.     Conjunctiva/sclera: Conjunctivae normal.     Pupils: Pupils are equal, round, and reactive to light.  Cardiovascular:     Rate and Rhythm: Normal rate.     Pulses: Normal pulses.     Heart sounds: Normal heart sounds.  Pulmonary:     Effort: Pulmonary effort is normal.     Breath sounds: Normal breath sounds.  Abdominal:     General: Abdomen is flat. Bowel sounds are normal.     Palpations: Abdomen is soft.  Musculoskeletal:     Cervical back: Normal range of motion and neck supple.     Right lower leg: Edema present.     Left lower leg: Edema present.     Comments: Chronic skin changes BLE Right small toe with mostly-avulsed toe nail Tenderness to entire ankle  Skin:    General: Skin is warm.     Capillary Refill: Capillary refill takes less than 2 seconds.  Neurological:     General: No focal deficit present.     Mental Status: She is alert and oriented to person, place, and time.  Psychiatric:        Mood and Affect: Mood normal.        Behavior: Behavior normal.     (all labs ordered are listed, but only abnormal results are displayed) Labs Reviewed  COMPREHENSIVE METABOLIC PANEL WITH GFR  CBC WITH DIFFERENTIAL/PLATELET  URINALYSIS, ROUTINE W REFLEX MICROSCOPIC    EKG: None  Radiology: DG Ankle Complete Right Result Date: 03/20/2024 CLINICAL DATA:  fall. EXAM: RIGHT ANKLE - COMPLETE 3+ VIEW; RIGHT FOOT COMPLETE - 3+ VIEW COMPARISON:  None Available. FINDINGS: There is acute comminuted/angulated and displaced fracture of the  distal right tibia. There is slightly angulated fracture of the distal right fibula also. No other acute fracture or dislocation. No aggressive osseous lesion. There is old fracture versus unfused apophysis along the base of the fifth metatarsal. Ankle mortise appears intact. There are mild to moderate diffuse degenerative changes of  imaged joints. Calcaneal spur noted along the Achilles tendon attachment site. Mild diffuse soft tissue swelling noted around the ankle joint. No radiopaque foreign bodies. IMPRESSION: Acute fractures of the distal right tibia and fibula, as described above. Electronically Signed   By: Ree Molt M.D.   On: 03/20/2024 16:17   DG Foot Complete Right Result Date: 03/20/2024 CLINICAL DATA:  fall. EXAM: RIGHT ANKLE - COMPLETE 3+ VIEW; RIGHT FOOT COMPLETE - 3+ VIEW COMPARISON:  None Available. FINDINGS: There is acute comminuted/angulated and displaced fracture of the distal right tibia. There is slightly angulated fracture of the distal right fibula also. No other acute fracture or dislocation. No aggressive osseous lesion. There is old fracture versus unfused apophysis along the base of the fifth metatarsal. Ankle mortise appears intact. There are mild to moderate diffuse degenerative changes of imaged joints. Calcaneal spur noted along the Achilles tendon attachment site. Mild diffuse soft tissue swelling noted around the ankle joint. No radiopaque foreign bodies. IMPRESSION: Acute fractures of the distal right tibia and fibula, as described above. Electronically Signed   By: Ree Molt M.D.   On: 03/20/2024 16:17     Nail Removal  Date/Time: 03/20/2024 4:17 PM  Performed by: Dean Clarity, MD Authorized by: Dean Clarity, MD   Consent:    Consent obtained:  Verbal   Consent given by:  Patient   Risks, benefits, and alternatives were discussed: yes     Risks discussed:  Pain   Alternatives discussed:  No treatment Procedure details:    Location:  Foot    Foot location:  R little toe Anesthesia:    Anesthesia method:  None Nail Removal:    Nail removed:  Complete Post-procedure details:    Procedure completion:  Tolerated well, no immediate complications    Medications Ordered in the ED  bacitracin  ointment (has no administration in time range)  etomidate  (AMIDATE ) injection 10 mg (has no administration in time range)  ondansetron  (ZOFRAN ) injection 4 mg (has no administration in time range)  HYDROmorphone  (DILAUDID ) injection 1 mg (has no administration in time range)  bupivacaine  (MARCAINE ) 0.5 % (with pres) injection 20 mL (has no administration in time range)                                    Medical Decision Making Amount and/or Complexity of Data Reviewed Labs: ordered. Radiology: ordered.  Risk OTC drugs. Prescription drug management.   This patient presents to the ED for concern of fall, this involves an extensive number of treatment options, and is a complaint that carries with it a high risk of complications and morbidity.  The differential diagnosis includes fx, sprain   Co morbidities that complicate the patient evaluation  HTN, COPD, HLD, CAD, asthma, depression, and GERD   Additional history obtained:  Additional history obtained from epic chart review External records from outside source obtained and reviewed including son/EMS report   Imaging Studies ordered:  I ordered imaging studies including r ankle/r foot  I independently visualized and interpreted imaging which showed  Right Ankle: Acute fractures of the distal right tibia and fibula, as described  above.  R foot: Acute fractures of the distal right tibia and fibula, as described  above.   I agree with the radiologist interpretation   Medicines ordered and prescription drug management:   I have reviewed the patients home medicines and have made adjustments as needed  Test Considered:  xr   Problem List / ED Course:  Distal  fibula and tibia fx:  pt will need reduction and splint.  Pt is signed out to Dr. Jerrol.  Pt will not be able to go home as she will be NWB and unable to get into her house.   Reevaluation:  After the interventions noted above, I reevaluated the patient and found that they have :improved   Social Determinants of Health:  Lives at home   Dispostion:  Pending at shift change     Final diagnoses:  Fall, initial encounter  Closed fracture of distal end of right fibula and tibia, initial encounter    ED Discharge Orders     None          Dean Clarity, MD 03/20/24 [phone]

## 2024-03-20 NOTE — Telephone Encounter (Signed)
 Spoke with patient and she is unsure if she may have UTI or what  Has appointment with Dr [place] (PCP) today to discuss issues Advised to discuss the vaginal as well with her  Will discuss with Dr Lonni when she returns to office if not addressed by Dr [place]

## 2024-03-20 NOTE — H&P (Signed)
 History and Physical    Beverly Wheeler FMW:[id number] DOB: May 17, 1946 DOA: 03/20/2024  PCP: [place], Betty G, MD   Patient coming from: Home   Chief Complaint:  Chief Complaint  Patient presents with   Fall    HPI: Hx limited due to poor historian, mild encephalopathy  Beverly Wheeler is a 78 y.o. female with hx of COPD, CHRF 2 L of oxygen , CAD, HFrEF, CKD stage IIIa, hypertension, hyperlipidemia, DM type 2, IDA, who presented after GLF. Reports she has been dealing with right ankle pain recently and has been wearing a boot.  Reports that yesterday she fell although timing of the fall is unclear.  Per ED felt a pop in her ankle.  Denies syncope, hitting head.  Denies any other recent injuries.  Otherwise denies recent illness.  She has significant limitation in her level of function related to her breathing and is short of breath with walking on flat ground.  Denies any history of chest pain.   Review of Systems:  ROS complete and negative except as marked above   No Known Allergies  Prior to Admission medications   Medication Sig Start Date End Date Taking? Authorizing Provider  Accu-Chek Softclix Lancets lancets Use to check blood sugars 1-2 times daily. Dx:e11.9 05/04/20   [place], Betty G, MD  albuterol  (VENTOLIN  HFA) 108 (90 Base) MCG/ACT inhaler INHALE 1 PUFF EVERY 4 HOURS AS NEEDED FOR WHEEZING OR SHORTNESS OF BREATH. 10/09/23   [place], Betty G, MD  aspirin  EC 81 MG EC tablet Take 1 tablet (81 mg total) by mouth daily. 10/21/13   Barrett, Shona MATSU, PA-C  atorvastatin  (LIPITOR ) 80 MG tablet TAKE 1 TABLET BY MOUTH EVERY DAY AT JEREL 11/06/23   Lonni Slain, MD  Blood Glucose Monitoring Suppl (ACCU-CHEK AVIVA PLUS) w/Device KIT Use to test blood sugars 1-2 times daily. Dx: E11.9 05/04/20   [place], Betty G, MD  cetirizine (ZYRTEC) 10 MG tablet Take 10 mg by mouth daily.    [provider]  dapagliflozin  propanediol (FARXIGA ) 10 MG TABS tablet Take 1 tablet (10  mg total) by mouth daily. 11/06/23   Lonni Slain, MD  escitalopram  (LEXAPRO ) 20 MG tablet TAKE 1 TABLET BY MOUTH EVERY DAY 06/26/23   [place], Betty G, MD  ezetimibe  (ZETIA ) 10 MG tablet Take 1 tablet (10 mg total) by mouth daily. 11/06/23   Lonni Slain, MD  Famotidine  (PEPCID  PO) Take 1 tablet by mouth daily.    [provider]  ferrous gluconate  (FERGON) 324 MG tablet TAKE 1 TABLET BY MOUTH TWICE A DAY WITH MEALS 08/09/19   [place], Betty G, MD  furosemide  (LASIX ) 40 MG tablet TAKE 1 TABLET (40 MG TOTAL) BY MOUTH 3 (THREE) TIMES A WEEK. 01/05/24   Lonni Slain, MD  glucose blood (ACCU-CHEK GUIDE TEST) test strip USE TO CHECK BLOOD SUGARS 4 TIMES DAILY 01/02/24   [place], Betty G, MD  guaiFENesin  (MUCINEX ) 600 MG 12 hr tablet Take 1 tablet (600 mg total) by mouth 2 (two) times daily. 04/14/23   Regalado, Belkys A, MD  losartan  (COZAAR ) 25 MG tablet Take 0.5 tablets (12.5 mg total) by mouth daily. 05/09/23   Colletta Manuelita Garre, PA-C  metFORMIN  (GLUCOPHAGE ) 500 MG tablet TAKE 1 TABLET BY MOUTH TWICE A DAY WITH FOOD 03/18/24   [place], Betty G, MD  mometasone -formoterol  (DULERA ) 200-5 MCG/ACT AERO Inhale 2 puffs into the lungs daily. 08/07/23   [place], Betty G, MD  montelukast  (SINGULAIR ) 10 MG tablet Take 1 tablet (10  mg total) by mouth at bedtime. 03/25/20   [place], Betty G, MD  nebivolol  (BYSTOLIC ) 5 MG tablet TAKE 1 TABLET BY MOUTH EVERY DAY 11/16/23   [place], Betty G, MD  nitroGLYCERIN  (NITROSTAT ) 0.4 MG SL tablet Place 1 tablet (0.4 mg total) under the tongue every 5 (five) minutes as needed for chest pain. 12/20/22   [place], Betty G, MD  spironolactone  (ALDACTONE ) 25 MG tablet Take 0.5 tablets (12.5 mg total) by mouth daily. 05/09/23   Colletta Manuelita Garre, PA-C  triamcinolone  (NASACORT  ALLERGY 24HR) 55 MCG/ACT AERO nasal inhaler Place 2 sprays into the nose daily.    [provider]  vitamin B-12 1000 MCG tablet Take 1 tablet (1,000 mcg total) by mouth  daily. 12/06/17   Dennise Lavada POUR, MD    Past Medical History:  Diagnosis Date   Allergy    Asthma    CAD (coronary artery disease)    COPD (chronic obstructive pulmonary disease) (HCC)    Depression    GERD (gastroesophageal reflux disease)    Hyperlipidemia    Hypertension    Myocardial infarction (HCC) 10/16/2013    Past Surgical History:  Procedure Laterality Date   CARDIAC CATHETERIZATION  10/17/13   significant 3 vessel disease   LEFT HEART CATHETERIZATION WITH CORONARY ANGIOGRAM N/A 10/17/2013   Procedure: LEFT HEART CATHETERIZATION WITH CORONARY ANGIOGRAM;  Surgeon: Victory LELON Claudene DOUGLAS, MD;  Location: [facility] CATH LAB;  Service: Cardiovascular;  Laterality: N/A;     reports that she quit smoking about 10 years ago. Her smoking use included cigarettes. She started smoking about 55 years ago. She has a 45 pack-year smoking history. She has never used smokeless tobacco. She reports that she does not currently use alcohol. She reports that she does not use drugs.  Family History  Problem Relation Age of Onset   Allergies Mother    Breast cancer Mother    Arthritis Mother    Cancer Mother    Hyperlipidemia Mother    Hypertension Mother    Heart disease Father    Heart disease Brother    Arthritis Brother    Cancer Brother    Depression Brother    Drug abuse Brother    Hyperlipidemia Brother    Heart attack Brother    Rheum arthritis Brother    Cancer Sister    Dementia Sister      Physical Exam: Vitals:   03/20/24 1832 03/20/24 1900 03/20/24 1915 03/20/24 2024  BP: (!) 139/49 (!) 132/33 (!) 125/103   Pulse: 74 72 80   Resp: 20 18 (!) 24   Temp:    98.1 F (36.7 C)  TempSrc:    Oral  SpO2: 100% 100% 97%   Weight:      Height:        Gen: Awake, alert, chronically ill-appearing HEENT: Hoarse CV: Regular, normal S1, S2, no murmurs  Resp: Normal WOB, diminished air movement throughout without other adventitious sounds Abd: Flat, normoactive, nontender MSK:  Right lower extremity in a splint.  Distally N/V intact Skin: No rashes or lesions to exposed skin  Neuro: Alert and interactive, increased latency of responses.  Oriented fully Psych: euthymic, appropriate    Data review:   Labs reviewed, notable for:   Cr 1.5 b/l ~ 1.2 - 1.4  WBC 13  Hb 9, near recent B/l. borderline macrocytic   Micro:  Results for orders placed or performed during the hospital encounter of 04/08/23  Resp panel by RT-PCR (RSV, Flu A&B,  Covid) Anterior Nasal Swab     Status: None   Collection Time: 04/08/23  6:42 PM   Specimen: Anterior Nasal Swab  Result Value Ref Range Status   SARS Coronavirus 2 by RT PCR NEGATIVE NEGATIVE Final   Influenza A by PCR NEGATIVE NEGATIVE Final   Influenza B by PCR NEGATIVE NEGATIVE Final    Comment: (NOTE) The Xpert Xpress SARS-CoV-2/FLU/RSV plus assay is intended as an aid in the diagnosis of influenza from Nasopharyngeal swab specimens and should not be used as a sole basis for treatment. Nasal washings and aspirates are unacceptable for Xpert Xpress SARS-CoV-2/FLU/RSV testing.  Fact Sheet for Patients: BloggerCourse.com  Fact Sheet for Healthcare Providers: SeriousBroker.it  This test is not yet approved or cleared by the [place]  FDA and has been authorized for detection and/or diagnosis of SARS-CoV-2 by FDA under an Emergency Use Authorization (EUA). This EUA will remain in effect (meaning this test can be used) for the duration of the COVID-19 declaration under Section 564(b)(1) of the Act, 21 U.S.C. section 360bbb-3(b)(1), unless the authorization is terminated or revoked.     Resp Syncytial Virus by PCR NEGATIVE NEGATIVE Final    Comment: (NOTE) Fact Sheet for Patients: BloggerCourse.com  Fact Sheet for Healthcare Providers: SeriousBroker.it  This test is not yet approved or cleared by the [place]   FDA and has been authorized for detection and/or diagnosis of SARS-CoV-2 by FDA under an Emergency Use Authorization (EUA). This EUA will remain in effect (meaning this test can be used) for the duration of the COVID-19 declaration under Section 564(b)(1) of the Act, 21 U.S.C. section 360bbb-3(b)(1), unless the authorization is terminated or revoked.  Performed at [facility], [address], [place], [place] [address]   SARS Coronavirus 2 by RT PCR (hospital order, performed in [facility] hospital lab) *cepheid single result test* Anterior Nasal Swab     Status: None   Collection Time: 04/08/23  6:43 PM   Specimen: Anterior Nasal Swab  Result Value Ref Range Status   SARS Coronavirus 2 by RT PCR NEGATIVE NEGATIVE Final    Comment: Performed at [facility], [address], [place], [place] [address]  MRSA Next Gen by PCR, Nasal     Status: None   Collection Time: 04/09/23 12:24 AM   Specimen: Nasal Mucosa; Nasal Swab  Result Value Ref Range Status   MRSA by PCR Next Gen NOT DETECTED NOT DETECTED Final    Comment: (NOTE) The GeneXpert MRSA Assay (FDA approved for NASAL specimens only), is one component of a comprehensive MRSA colonization surveillance program. It is not intended to diagnose MRSA infection nor to guide or monitor treatment for MRSA infections. Test performance is not FDA approved in patients less than 70 years old. Performed at [facility], [address], [place], [place] [address]     Imaging reviewed:  CT ANKLE RIGHT WO CONTRAST Result Date: 03/20/2024 CLINICAL DATA:  Fracture of the right tibia and fibula. Surgical planning. EXAM: CT OF THE RIGHT ANKLE WITHOUT CONTRAST TECHNIQUE: Multidetector CT imaging of the right ankle was performed according to the standard protocol. Multiplanar CT image reconstructions were also generated. RADIATION DOSE REDUCTION: This exam was performed according to the departmental dose-optimization program which  includes automated exposure control, adjustment of the mA and/or kV according to patient size and/or use of iterative reconstruction technique. COMPARISON:  Earlier radiograph dated 03/20/2024. FINDINGS: Bones/Joint/Cartilage There is a mildly angulated oblique fracture of the distal fibula.  There is a comminuted, mildly displaced and angulated fracture of the distal tibial metadiaphysis with extension into the articular surface of the tibial plafond. There is no dislocation. There is severe osteopenia. Ligaments Suboptimally assessed by CT. Muscles and Tendons No acute findings. Soft tissues There is diffuse skin thickening and subcutaneous edema of the distal like and foot. No fluid collection or soft tissue gas. An overlying cast noted. IMPRESSION: 1. Comminuted, mildly displaced and angulated intra-articular fracture of the distal tibial. 2. Mildly angulated oblique fracture of the distal fibula. 3. Severe osteopenia. Electronically Signed   By: Vanetta Chou M.D.   On: 03/20/2024 20:20   DG Ankle 2 Views Right Result Date: 03/20/2024 CLINICAL DATA:  Distal tibial and fibular fractures. EXAM: RIGHT ANKLE - 2 VIEW COMPARISON:  Earlier radiograph dated 03/20/2024. FINDINGS: Slight interval decrease in the angulation of the distal tibial and fibular fractures compared to the earlier radiograph. There has been interval placement of a cast. IMPRESSION: Slight interval decrease in the angulation of the distal tibial and fibular fractures. Electronically Signed   By: Vanetta Chou M.D.   On: 03/20/2024 19:35   DG Ankle Complete Right Result Date: 03/20/2024 CLINICAL DATA:  fall. EXAM: RIGHT ANKLE - COMPLETE 3+ VIEW; RIGHT FOOT COMPLETE - 3+ VIEW COMPARISON:  None Available. FINDINGS: There is acute comminuted/angulated and displaced fracture of the distal right tibia. There is slightly angulated fracture of the distal right fibula also. No other acute fracture or dislocation. No aggressive osseous lesion.  There is old fracture versus unfused apophysis along the base of the fifth metatarsal. Ankle mortise appears intact. There are mild to moderate diffuse degenerative changes of imaged joints. Calcaneal spur noted along the Achilles tendon attachment site. Mild diffuse soft tissue swelling noted around the ankle joint. No radiopaque foreign bodies. IMPRESSION: Acute fractures of the distal right tibia and fibula, as described above. Electronically Signed   By: Ree Molt M.D.   On: 03/20/2024 16:17   DG Foot Complete Right Result Date: 03/20/2024 CLINICAL DATA:  fall. EXAM: RIGHT ANKLE - COMPLETE 3+ VIEW; RIGHT FOOT COMPLETE - 3+ VIEW COMPARISON:  None Available. FINDINGS: There is acute comminuted/angulated and displaced fracture of the distal right tibia. There is slightly angulated fracture of the distal right fibula also. No other acute fracture or dislocation. No aggressive osseous lesion. There is old fracture versus unfused apophysis along the base of the fifth metatarsal. Ankle mortise appears intact. There are mild to moderate diffuse degenerative changes of imaged joints. Calcaneal spur noted along the Achilles tendon attachment site. Mild diffuse soft tissue swelling noted around the ankle joint. No radiopaque foreign bodies. IMPRESSION: Acute fractures of the distal right tibia and fibula, as described above. Electronically Signed   By: Ree Molt M.D.   On: 03/20/2024 16:17    EKG: pending    ED Course:  EDP Spoke with Dr. Georgina of orthopedics who recommended medicine admission, plan for likely n.p.o. at midnight and surgery in the a.m. Underwent application of RLE splint by ortho tech    Assessment/Plan:  78 y.o. female with hx COPD, CHRF 2 L of oxygen , CAD, HFrEF, CKD stage IIIa, hypertension, hyperlipidemia, DM type 2, IDA, who presented after GLF c/b right ankle fracture   Ground level fall  C/b closed R distal tibia / fibular fracture  -- EDP Spoke with Dr. Georgina of  orthopedics. n.p.o. at midnight and surgery in the a.m. Underwent application of RLE splint by ortho tech  - DVT  prophylaxis to be readdressed postop; SCD for now  - Tele monitoring periop - PT/ OT eval postop ordered   - Pain mgmt: Tylenol  prn for mild, oxycodone  2.5/5 mg p.o. every 4 hours prn for moderate/severe, Dilaudid  0.5 mg IV every 4 hours prn for breakthrough - Check vitamin D  25 OH   Preoperative risk stratification  RCRI is 2, 5% risk of MACE. May underestimate risk given her multivessel CAD, HFrEF, valvular disease (although no severe valvular disease), and severe COPD and CHRF. Her METs are unknown; per [facility] activity 3.3. At this point although she is at elevated risk for noncardiac surgery do not feel additional cardiac testing would modify risk especially since she has declined more definitive management of her CAD in the past.  -- Check EKG, periop tele monitoring. No additional cardiac testing recommended preop.  -- Hold losartan , diuretics preop.   Leukocytosis  Suspect reactive in setting of above  -- Trend CBC. Check UA, CXR   Mild encephalopathy  Mild confusion and increased latency noted on initial interview. Fully oriented otherwise although slow to answer. Suspect related to pain medications.  -- Continue to monitor. Additional workup if worsening   Goals of care  She was uncertain re: code status, would like to be full code for now while thinking about this more. Has declined definitive management of her CAD in the past. With her advanced comorbidities recovery during resuscitation is expected to be poor which I discussed with patient  -- For now remains as full code. Continue goals of care discussions.   Chronic medical problems:  COPD: Without acute exacerbation, sub home dulera  (not taking PTA), montelukast . Nebs prn   CHRF on 2L O2: continue home O2.  CAD, hx of prior MI: Hx of multivessel CAD and referred for CABG in '15 but declined. Hold aspirin  periop  resume as soon as able. Continue home statin, zetia   HFrEF: Last TTE 9/'24 with LVEF 25-30%, global hypokinesis with local WM abnormalities, Gr 2 DD, biatrial dilation, moderate MR, mild MS, mild AS. Temporarily pause lasix  (takes three times per week) and Farxiga  preop. Hold Losartan  preop, resume after. Continue home Spironolactone , Bystolic .  CKD stage IIIa: Baseline Cr near 1.2 - 1.4. On admission is 1.5, continue to monitor.  HTN: See GDMT meds above  HLD: See CAD above  DM type 2: No recent A1c, check. Holding home PO meds, not currently requiring SSI  IDA: Hb near baseline ~ 9. Borderline macrocytic. Repeat iron  panel, B12, Folate  Mood d/o: Continue home lexapro    Body mass index is 33.44 kg/m. Obesity class I would benefit from weight loss outpatient    DVT prophylaxis:  SCDs Code Status:  Full Code Diet:  Diet Orders (From admission, onward)    None      Family Communication:  None  Consults:  Orthopedics   Admission status:   Inpatient, Telemetry bed  Severity of Illness: The appropriate patient status for this patient is INPATIENT. Inpatient status is judged to be reasonable and necessary in order to provide the required intensity of service to ensure the patient's safety. The patient's presenting symptoms, physical exam findings, and initial radiographic and laboratory data in the context of their chronic comorbidities is felt to place them at high risk for further clinical deterioration. Furthermore, it is not anticipated that the patient will be medically stable for discharge from the hospital within 2 midnights of admission.   * I certify that at the point of admission it is my  clinical judgment that the patient will require inpatient hospital care spanning beyond 2 midnights from the point of admission due to high intensity of service, high risk for further deterioration and high frequency of surveillance required.*   Dorn Dawson, MD [facility]  How to  contact the [facility] Attending or Consulting provider 7A - 7P or covering provider during after hours 7P -7A, for this patient.  Check the care team in [facility] and look for a) attending/consulting [facility] provider listed and b) the [facility] team listed Log into [email or web address] and use 's universal password to access. If you do not have the password, please contact the hospital operator. Locate the [facility] provider you are looking for under [facility] and page to a number that you can be directly reached. If you still have difficulty reaching the provider, please page the [facility] (Director on Call) for the Hospitalists listed on amion for assistance.  03/20/2024, 8:48 PM

## 2024-03-20 NOTE — ED Notes (Signed)
 Patient changed into clean brief d/t being soiled with urine and stool. Patient is dressed into gown and on a cardiac monitor.

## 2024-03-20 NOTE — ED Notes (Signed)
 Unit notified of patient transport to floor.

## 2024-03-20 NOTE — ED Triage Notes (Signed)
 Pt bib [facility] coming from home after having mechanical fall. Patient normally uses walker but tripped. Pt has right ankle pain and her right pinky toe fell off. EMS reports no LOC, pt not on thinners. GCS 15.  EMS VS: 220/80 70 HR 30 RR 90% 2L (baseline) Cbg 169

## 2024-03-20 NOTE — ED Notes (Signed)
 Patient transported to CT

## 2024-03-21 ENCOUNTER — Inpatient Hospital Stay (HOSPITAL_COMMUNITY)

## 2024-03-21 ENCOUNTER — Encounter (HOSPITAL_COMMUNITY)
Admission: EM | Disposition: A | Discharge status: Skilled Nursing Facility | Payer: Self-pay | Source: Home / Self Care | Attending: Internal Medicine

## 2024-03-21 ENCOUNTER — Inpatient Hospital Stay (HOSPITAL_COMMUNITY): Admitting: Anesthesiology

## 2024-03-21 ENCOUNTER — Encounter (HOSPITAL_COMMUNITY): Payer: Self-pay | Admitting: Internal Medicine

## 2024-03-21 ENCOUNTER — Other Ambulatory Visit: Payer: Self-pay

## 2024-03-21 DIAGNOSIS — S82871A Displaced pilon fracture of right tibia, initial encounter for closed fracture: Secondary | ICD-10-CM

## 2024-03-21 DIAGNOSIS — I5020 Unspecified systolic (congestive) heart failure: Secondary | ICD-10-CM | POA: Diagnosis not present

## 2024-03-21 DIAGNOSIS — S82301A Unspecified fracture of lower end of right tibia, initial encounter for closed fracture: Secondary | ICD-10-CM

## 2024-03-21 DIAGNOSIS — S82891A Other fracture of right lower leg, initial encounter for closed fracture: Secondary | ICD-10-CM | POA: Diagnosis not present

## 2024-03-21 DIAGNOSIS — I130 Hypertensive heart and chronic kidney disease with heart failure and stage 1 through stage 4 chronic kidney disease, or unspecified chronic kidney disease: Secondary | ICD-10-CM

## 2024-03-21 DIAGNOSIS — S82831A Other fracture of upper and lower end of right fibula, initial encounter for closed fracture: Secondary | ICD-10-CM | POA: Diagnosis not present

## 2024-03-21 DIAGNOSIS — N183 Chronic kidney disease, stage 3 unspecified: Secondary | ICD-10-CM | POA: Diagnosis not present

## 2024-03-21 LAB — CBC
HCT: 27.4 % — ABNORMAL LOW (ref 36.0–46.0)
Hemoglobin: 8.4 g/dL — ABNORMAL LOW (ref 12.0–15.0)
MCH: 29.8 pg (ref 26.0–34.0)
MCHC: 30.7 g/dL (ref 30.0–36.0)
MCV: 97.2 fL (ref 80.0–100.0)
Platelets: 197 K/uL (ref 150–400)
RBC: 2.82 MIL/uL — ABNORMAL LOW (ref 3.87–5.11)
RDW: 13.2 % (ref 11.5–15.5)
WBC: 9.6 K/uL (ref 4.0–10.5)
nRBC: 0 % (ref 0.0–0.2)

## 2024-03-21 LAB — BASIC METABOLIC PANEL WITH GFR
Anion gap: 11 (ref 5–15)
BUN: 16 mg/dL (ref 8–23)
CO2: 32 mmol/L (ref 22–32)
Calcium: 8.8 mg/dL — ABNORMAL LOW (ref 8.9–10.3)
Chloride: 98 mmol/L (ref 98–111)
Creatinine, Ser: 1.53 mg/dL — ABNORMAL HIGH (ref 0.44–1.00)
GFR, Estimated: 35 mL/min — ABNORMAL LOW (ref >60)
Glucose, Bld: 138 mg/dL — ABNORMAL HIGH (ref 70–99)
Potassium: 5.5 mmol/L — ABNORMAL HIGH (ref 3.5–5.1)
Sodium: 141 mmol/L (ref 135–145)

## 2024-03-21 LAB — FERRITIN: Ferritin: 24 ng/mL (ref 11–307)

## 2024-03-21 LAB — POCT I-STAT, CHEM 8
BUN: 19 mg/dL (ref 8–23)
Calcium, Ion: 1.2 mmol/L (ref 1.15–1.40)
Chloride: 96 mmol/L — ABNORMAL LOW (ref 98–111)
Creatinine, Ser: 1.6 mg/dL — ABNORMAL HIGH (ref 0.44–1.00)
Glucose, Bld: 132 mg/dL — ABNORMAL HIGH (ref 70–99)
HCT: 26 % — ABNORMAL LOW (ref 36.0–46.0)
Hemoglobin: 8.8 g/dL — ABNORMAL LOW (ref 12.0–15.0)
Potassium: 5.5 mmol/L — ABNORMAL HIGH (ref 3.5–5.1)
Sodium: 138 mmol/L (ref 135–145)
TCO2: 37 mmol/L — ABNORMAL HIGH (ref 22–32)

## 2024-03-21 LAB — GLUCOSE, CAPILLARY
Glucose-Capillary: 132 mg/dL — ABNORMAL HIGH (ref 70–99)
Glucose-Capillary: 137 mg/dL — ABNORMAL HIGH (ref 70–99)
Glucose-Capillary: 152 mg/dL — ABNORMAL HIGH (ref 70–99)
Glucose-Capillary: 145 mg/dL — ABNORMAL HIGH (ref 70–99)

## 2024-03-21 LAB — IRON AND TIBC
Iron: 34 ug/dL (ref 28–170)
Saturation Ratios: 10 % — ABNORMAL LOW (ref 10.4–31.8)
TIBC: 329 ug/dL (ref 250–450)
UIBC: 295 ug/dL

## 2024-03-21 LAB — SURGICAL PCR SCREEN
MRSA, PCR: NEGATIVE
Staphylococcus aureus: POSITIVE — AB

## 2024-03-21 LAB — PHOSPHORUS: Phosphorus: 4.7 mg/dL — ABNORMAL HIGH (ref 2.5–4.6)

## 2024-03-21 LAB — FOLATE: Folate: 10.7 ng/mL (ref >5.9)

## 2024-03-21 LAB — MAGNESIUM: Magnesium: 1.7 mg/dL (ref 1.7–2.4)

## 2024-03-21 LAB — TYPE AND SCREEN
ABO/RH(D): O POS
Antibody Screen: NEGATIVE

## 2024-03-21 LAB — VITAMIN B12: Vitamin B-12: 662 pg/mL (ref 180–914)

## 2024-03-21 LAB — TRANSFERRIN: Transferrin: 223 mg/dL (ref 192–382)

## 2024-03-21 LAB — VITAMIN D 25 HYDROXY (VIT D DEFICIENCY, FRACTURES): Vit D, 25-Hydroxy: 18.11 ng/mL — ABNORMAL LOW (ref 30–100)

## 2024-03-21 LAB — ABO/RH: ABO/RH(D): O POS

## 2024-03-21 SURGERY — OPEN REDUCTION INTERNAL FIXATION (ORIF) ANKLE FRACTURE
Anesthesia: Monitor Anesthesia Care | Site: Ankle | Laterality: Right

## 2024-03-21 MED ORDER — ONDANSETRON HCL 4 MG/2ML IJ SOLN: 4.0000 mg | Freq: Once | INTRAMUSCULAR | Status: DC | PRN

## 2024-03-21 MED ORDER — CHLORHEXIDINE GLUCONATE CLOTH 2 % EX PADS
6.0000 | MEDICATED_PAD | Freq: Every day | CUTANEOUS | Status: AC
  Administered 2024-03-21 – 2024-03-25 (×5): 6 via TOPICAL

## 2024-03-21 MED ORDER — FENTANYL CITRATE (PF) 100 MCG/2ML IJ SOLN
INTRAMUSCULAR | Status: AC
  Administered 2024-03-21: 25 ug via INTRAVENOUS
  Filled 2024-03-21: qty 2

## 2024-03-21 MED ORDER — ALBUMIN HUMAN 5 % IV SOLN
INTRAVENOUS | Status: AC
Start: 2024-03-21 — End: 2024-03-21
  Filled 2024-03-21: qty 250

## 2024-03-21 MED ORDER — FENTANYL CITRATE (PF) 100 MCG/2ML IJ SOLN: 25.0000 ug | Freq: Once | INTRAMUSCULAR | Status: AC

## 2024-03-21 MED ORDER — OXYCODONE HCL 5 MG PO TABS: 5.0000 mg | ORAL_TABLET | Freq: Once | ORAL | Status: DC | PRN

## 2024-03-21 MED ORDER — LACTATED RINGERS IV SOLN: INTRAVENOUS | Status: DC | PRN

## 2024-03-21 MED ORDER — PHENYLEPHRINE HCL-NACL 20-0.9 MG/250ML-% IV SOLN
INTRAVENOUS | Status: DC | PRN
  Administered 2024-03-21: 50 ug/min via INTRAVENOUS

## 2024-03-21 MED ORDER — MUPIROCIN 2 % EX OINT
1.0000 | TOPICAL_OINTMENT | Freq: Two times a day (BID) | CUTANEOUS | Status: AC
  Administered 2024-03-21 – 2024-03-26 (×10): 1 via NASAL
  Filled 2024-03-21 (×2): qty 22

## 2024-03-21 MED ORDER — DEXMEDETOMIDINE HCL IN NACL 400 MCG/100ML IV SOLN
INTRAVENOUS | Status: DC | PRN
  Administered 2024-03-21: .4 ug/kg/h via INTRAVENOUS

## 2024-03-21 MED ORDER — VITAMIN D (ERGOCALCIFEROL) 1.25 MG (50000 UNIT) PO CAPS
50000.0000 [IU] | ORAL_CAPSULE | ORAL | Status: DC
  Filled 2024-03-21: qty 1

## 2024-03-21 MED ORDER — LIDOCAINE-EPINEPHRINE (PF) 1.5 %-1:200000 IJ SOLN
INTRAMUSCULAR | Status: DC | PRN
  Administered 2024-03-21 (×2): 5 mL via PERINEURAL

## 2024-03-21 MED ORDER — METOPROLOL TARTRATE 5 MG/5ML IV SOLN: 5.0000 mg | INTRAVENOUS | Status: DC | PRN

## 2024-03-21 MED ORDER — INSULIN ASPART 100 UNIT/ML IJ SOLN: 0.0000 [IU] | INTRAMUSCULAR | Status: DC | PRN

## 2024-03-21 MED ORDER — VANCOMYCIN HCL 1000 MG IV SOLR
INTRAVENOUS | Status: DC | PRN
Start: 2024-03-21 — End: 2024-03-21
  Administered 2024-03-21: 1000 mg

## 2024-03-21 MED ORDER — IPRATROPIUM-ALBUTEROL 0.5-2.5 (3) MG/3ML IN SOLN: 3.0000 mL | RESPIRATORY_TRACT | Status: DC | PRN

## 2024-03-21 MED ORDER — PROPOFOL 500 MG/50ML IV EMUL
INTRAVENOUS | Status: DC | PRN
  Administered 2024-03-21: 25 ug/kg/min via INTRAVENOUS

## 2024-03-21 MED ORDER — VANCOMYCIN HCL 1000 MG IV SOLR
INTRAVENOUS | Status: AC
  Filled 2024-03-21: qty 20

## 2024-03-21 MED ORDER — 0.9 % SODIUM CHLORIDE (POUR BTL) OPTIME
TOPICAL | Status: DC | PRN
  Administered 2024-03-21: 1000 mL

## 2024-03-21 MED ORDER — BUPIVACAINE-EPINEPHRINE (PF) 0.5% -1:200000 IJ SOLN
INTRAMUSCULAR | Status: DC | PRN
  Administered 2024-03-21: 25 mL via PERINEURAL
  Administered 2024-03-21: 15 mL via PERINEURAL

## 2024-03-21 MED ORDER — ALBUMIN HUMAN 5 % IV SOLN
12.5000 g | Freq: Once | INTRAVENOUS | Status: AC
  Administered 2024-03-21: 12.5 g via INTRAVENOUS

## 2024-03-21 MED ORDER — GUAIFENESIN 100 MG/5ML PO LIQD: 5.0000 mL | ORAL | Status: DC | PRN

## 2024-03-21 MED ORDER — DEXMEDETOMIDINE HCL IN NACL 400 MCG/100ML IV SOLN
INTRAVENOUS | Status: AC
  Filled 2024-03-21: qty 100

## 2024-03-21 MED ORDER — CHLORHEXIDINE GLUCONATE 0.12 % MT SOLN
OROMUCOSAL | Status: AC
  Administered 2024-03-21: 15 mL
  Filled 2024-03-21: qty 15

## 2024-03-21 MED ORDER — HYDROMORPHONE HCL 1 MG/ML IJ SOLN: 0.2500 mg | INTRAMUSCULAR | Status: DC | PRN

## 2024-03-21 MED ORDER — HYDRALAZINE HCL 20 MG/ML IJ SOLN: 10.0000 mg | INTRAMUSCULAR | Status: DC | PRN

## 2024-03-21 MED ORDER — GLUCAGON HCL RDNA (DIAGNOSTIC) 1 MG IJ SOLR: 1.0000 mg | INTRAMUSCULAR | Status: DC | PRN

## 2024-03-21 MED ORDER — OXYCODONE HCL 5 MG/5ML PO SOLN: 5.0000 mg | Freq: Once | ORAL | Status: DC | PRN

## 2024-03-21 MED ORDER — SENNOSIDES-DOCUSATE SODIUM 8.6-50 MG PO TABS: 1.0000 | ORAL_TABLET | Freq: Every evening | ORAL | Status: DC | PRN

## 2024-03-21 MED ORDER — ACETAMINOPHEN 500 MG PO TABS
1000.0000 mg | ORAL_TABLET | Freq: Once | ORAL | Status: AC
  Administered 2024-03-21: 1000 mg via ORAL

## 2024-03-21 SURGICAL SUPPLY — 50 items
BAG COUNTER SPONGE SURGICOUNT (BAG) ×1 IMPLANT
BIT DRILL OVR 3.5AO QC SHRT SM (DRILL) IMPLANT
BIT DRILL QC 2.0 SHORT EVOS SM (DRILL) IMPLANT
BIT DRILL QC 2.5MM SHRT EVO SM (DRILL) IMPLANT
BNDG COHESIVE 4X5 TAN STRL LF (GAUZE/BANDAGES/DRESSINGS) ×1 IMPLANT
BNDG COMPR ESMARK 6X3 LF (GAUZE/BANDAGES/DRESSINGS) IMPLANT
CANISTER SUCTION 3000ML PPV (SUCTIONS) ×1 IMPLANT
CANISTER WOUNDNEG PRESSURE 500 (CANNISTER) IMPLANT
COVER SURGICAL LIGHT HANDLE (MISCELLANEOUS) ×1 IMPLANT
CUFF TRNQT CYL 34X4.125X (TOURNIQUET CUFF) ×1 IMPLANT
DRAPE C-ARM 42X72 X-RAY (DRAPES) ×1 IMPLANT
DRAPE C-ARMOR (DRAPES) ×1 IMPLANT
DRAPE DERMATAC (DRAPES) IMPLANT
DRAPE U-SHAPE 47X51 STRL (DRAPES) ×1 IMPLANT
DRESSING PEEL AND PLAC PRVNA20 (GAUZE/BANDAGES/DRESSINGS) IMPLANT
DURAPREP 26ML APPLICATOR (WOUND CARE) ×1 IMPLANT
ELECT COATED BLADE 2.86 ST (ELECTRODE) ×1 IMPLANT
ELECTRODE REM PT RTRN 9FT ADLT (ELECTROSURGICAL) ×1 IMPLANT
GAUZE SPONGE 4X4 12PLY STRL (GAUZE/BANDAGES/DRESSINGS) ×1 IMPLANT
GLOVE INDICATOR 7.5 STRL GRN (GLOVE) ×1 IMPLANT
GLOVE SS BIOGEL STRL SZ 7.5 (GLOVE) ×1 IMPLANT
GOWN STRL REUS W/ TWL LRG LVL3 (GOWN DISPOSABLE) ×1 IMPLANT
GOWN STRL REUS W/ TWL XL LVL3 (GOWN DISPOSABLE) ×1 IMPLANT
GOWN STRL SURGICAL XL XLNG (GOWN DISPOSABLE) ×2 IMPLANT
KIT BASIN OR (CUSTOM PROCEDURE TRAY) ×1 IMPLANT
KIT DRSG PREVENA PLUS 7DAY 125 (MISCELLANEOUS) IMPLANT
KIT TURNOVER KIT B (KITS) ×1 IMPLANT
KWIRE FX150X1.6XTROC PNT (WIRE) IMPLANT
NS IRRIG 1000ML POUR BTL (IV SOLUTION) ×1 IMPLANT
PACK ORTHO EXTREMITY (CUSTOM PROCEDURE TRAY) ×1 IMPLANT
PAD ARMBOARD POSITIONER FOAM (MISCELLANEOUS) ×2 IMPLANT
PAD CAST 4YDX4 CTTN HI CHSV (CAST SUPPLIES) ×2 IMPLANT
PLATE TIB EVOS 6H L 3.5X107 (Plate) IMPLANT
SCREW CORTEX 3.5X24MM (Screw) IMPLANT
SCREW CTX 3.5X36MM EVOS (Screw) IMPLANT
SCREW CTX 3.5X38MM EVOS (Screw) IMPLANT
SCREW LOCK 3.5X36MM EVOS (Screw) IMPLANT
SCREW LOCK CORTICAL 3.5X42 (Screw) IMPLANT
SCREW LOCK EVOS 2.7X32 (Screw) IMPLANT
SCREW LOCK ST EVOS 2.7X36 (Screw) IMPLANT
SCREW LOCK ST EVOS 3.5X38 (Screw) IMPLANT
SCREW LOCK T8 38X2.7XST (Screw) IMPLANT
SPONGE T-LAP 18X18 ~~LOC~~+RFID (SPONGE) ×1 IMPLANT
SUCTION TUBE FRAZIER 10FR DISP (SUCTIONS) ×1 IMPLANT
SUT ETHILON 2 0 FS 18 (SUTURE) ×1 IMPLANT
SUT VIC AB 2-0 CT1 18 (SUTURE) ×1 IMPLANT
SUT VIC AB 2-0 CT2 18 VCP726D (SUTURE) ×1 IMPLANT
TOWEL GREEN STERILE (TOWEL DISPOSABLE) ×1 IMPLANT
TOWEL GREEN STERILE FF (TOWEL DISPOSABLE) ×1 IMPLANT
TUBE CONNECTING 12X1/4 (SUCTIONS) ×1 IMPLANT

## 2024-03-21 NOTE — Anesthesia Postprocedure Evaluation (Signed)
 Anesthesia Post Note  Patient: Beverly Wheeler  Procedure(s) Performed: OPEN REDUCTION INTERNAL FIXATION (ORIF) ANKLE FRACTURE (Right: Ankle)     Patient location during evaluation: PACU Anesthesia Type: Regional and MAC Level of consciousness: awake and alert Pain management: pain level controlled Vital Signs Assessment: post-procedure vital signs reviewed and stable Respiratory status: spontaneous breathing, nonlabored ventilation, respiratory function stable and patient connected to nasal cannula oxygen  Cardiovascular status: stable and blood pressure returned to baseline Postop Assessment: no apparent nausea or vomiting Anesthetic complications: no   No notable events documented.  Last Vitals:  Vitals:   03/21/24 1715 03/21/24 1725  BP: (!) 128/52 (!) 116/54  Pulse: (!) 54 (!) 56  Resp: 12 12  Temp:    SpO2: 100% 100%    Last Pain:  Vitals:   03/21/24 1710  TempSrc:   PainSc: Asleep                 Emmalene Kattner S

## 2024-03-21 NOTE — Anesthesia Procedure Notes (Signed)
 Anesthesia Regional Block: Adductor canal block   Pre-Anesthetic Checklist: , timeout performed,  Correct Patient, Correct Site, Correct Laterality,  Correct Procedure, Correct Position, site marked,  Risks and benefits discussed,  Surgical consent,  Pre-op evaluation,  At surgeon's request and post-op pain management  Laterality: Right and Lower  Prep: chloraprep       Needles:  Injection technique: Single-shot      Needle Length: 9cm  Needle Gauge: 22     Additional Needles: Arrow StimuQuik ECHO Echogenic Stimulating PNB Needle  Procedures:,,,, ultrasound used (permanent image in chart),,    Narrative:  Start time: 03/21/2024 1:18 PM End time: 03/21/2024 1:23 PM Injection made incrementally with aspirations every 5 mL.  Performed by: Personally  Anesthesiologist: Leopoldo Bruckner, MD

## 2024-03-21 NOTE — Transfer of Care (Signed)
 Immediate Anesthesia Transfer of Care Note  Patient: Beverly Wheeler  Procedure(s) Performed: OPEN REDUCTION INTERNAL FIXATION (ORIF) ANKLE FRACTURE (Right: Ankle)  Patient Location: PACU  Anesthesia Type:MAC  Level of Consciousness: sedated  Airway & Oxygen  Therapy: Patient Spontanous Breathing and Patient connected to face mask oxygen   Post-op Assessment: Report given to RN and Post -op Vital signs reviewed and stable  Post vital signs: Reviewed and stable  Last Vitals:  Vitals Value Taken Time  BP 128/52 03/21/24 17:16  Temp 36.7 C 03/21/24 17:10  Pulse 56 03/21/24 17:19  Resp 13 03/21/24 17:19  SpO2 100 % 03/21/24 17:19  Vitals shown include unfiled device data.  Last Pain:  Vitals:   03/21/24 1244  TempSrc:   PainSc: 0-No pain         Complications: No notable events documented.

## 2024-03-21 NOTE — Progress Notes (Signed)
 PROGRESS NOTE    Beverly Wheeler  FMW:[id number] DOB: 1946/06/22 DOA: 03/20/2024 PCP: [place], Betty G, MD    Brief Narrative:  78 year old with history of chronic hypoxic respiratory failure 2 L nasal cannula, COPD, CAD, CHF with reduced EF, CKD 3A, HTN, HLD, DM 2, iron  deficiency anemia presented with ground-level fall causing right distal tibial/fibular fracture.  Orthopedic consulted.   Assessment & Plan:  Principal Problem:   Closed right ankle fracture Active Problems:   Acute encephalopathy   Goals of care, counseling/discussion   Leukocytosis     Ground level fall  C/b closed R distal tibia / fibular fracture  EDP already discussed with Dr. Georgina from orthopedic.  Planning on ORIF today - Pain control, bowel regimen   Leukocytosis  Reactive.  Already resolved   Mild encephalopathy  Improving.  Continue to closely monitor   Goals of care  She was uncertain re: code status, would like to be full code for now while thinking about this more. Has declined definitive management of her CAD in the past. With her advanced comorbidities recovery during resuscitation is expected to be poor which I discussed with patient  -- For now remains as full code. Continue goals of care discussions.     COPD  Without acute exacerbation, sub home dulera  (not taking PTA), montelukast . Nebs prn   CHRF on 2L O2  continue home O2.   CAD, hx of prior MI  Hx of multivessel CAD and referred for CABG in '15 but declined. Hold aspirin  periop resume as soon as able. Continue home statin, zetia    HFrEF  Last TTE 9/'24 with LVEF 25-30%, global hypokinesis with local WM abnormalities, Gr 2 DD, biatrial dilation, moderate MR, mild AS. Temporarily pause lasix  (takes three times per week) and Farxiga  preop. Hold Losartan  preop, resume after. Continue home Spironolactone , Bystolic .  Follow-up outpatient [facility] cardiology. Due for outptn follow up and Repeat Echo.   CKD stage IIIa  Baseline Cr near  1.2 - 1.4. On admission is 1.5, continue to monitor.   HTN  See GDMT meds above   HLD  See CAD above   DM type 2  No recent A1c, check. Holding home PO meds, not currently requiring SSI   IDA  Hb near baseline ~ 9. Borderline macrocytic. Repeat iron  panel, B12, Folate   Mood d/o  Continue home lexapro    Vitamin D  deficiency - Weekly supplements added    DVT prophylaxis: SCDs Start: 03/20/24 2104    Code Status: Full Code Family Communication:   Status is: Inpatient Remains inpatient appropriate because: Will need ORIF   PT Follow up Recs:   Subjective:  Feels ok besides pain in her lower ext as expected.   Examination:  General exam: Appears calm and comfortable  Respiratory system: Clear to auscultation. Respiratory effort normal. Cardiovascular system: S1 & S2 heard, RRR. No JVD, murmurs, rubs, gallops or clicks. No pedal edema. Gastrointestinal system: Abdomen is nondistended, soft and nontender. No organomegaly or masses felt. Normal bowel sounds heard. Central nervous system: Alert and oriented. No focal neurological deficits. Extremities: Symmetric 5 x 5 power. Skin: RLE dressing in place.  Psychiatry: Judgement and insight appear normal. Mood & affect appropriate.                Diet Orders (From admission, onward)     Start     Ordered   03/21/24 0001  Diet NPO time specified Except for: Sips with Meds  Diet effective midnight  Question:  Except for  Answer:  Sips with Meds   03/20/24 2221            Objective: Vitals:   03/21/24 0449 03/21/24 0743 03/21/24 0745 03/21/24 0841  BP: (!) 127/49 (!) 139/121 (!) 126/42 (!) 134/48  Pulse: (!) 59 63 62   Resp: 17 18    Temp: 97.9 F (36.6 C) 98 F (36.7 C)    TempSrc:  Oral    SpO2: 98% 91% 93%   Weight:      Height:        Intake/Output Summary (Last 24 hours) at 03/21/2024 1044 Last data filed at 03/21/2024 0900 Gross per 24 hour  Intake 100 ml  Output 0 ml  Net 100 ml    Filed Weights   03/20/24 1531  Weight: 80.3 kg    Scheduled Meds:  atorvastatin   80 mg Oral QHS   cyanocobalamin   1,000 mcg Oral Daily   escitalopram   20 mg Oral Daily   ezetimibe   10 mg Oral Daily   ferrous gluconate   324 mg Oral BID WC   fluticasone  furoate-vilanterol  1 puff Inhalation Daily   montelukast   10 mg Oral QHS   nebivolol   5 mg Oral Daily   ondansetron  (ZOFRAN ) IV  4 mg Intravenous Once   sodium chloride  flush  3 mL Intravenous Q12H   spironolactone   12.5 mg Oral Daily   Vitamin D  (Ergocalciferol )  50,000 Units Oral Q7 days   Continuous Infusions:   ceFAZolin  (ANCEF ) IV     tranexamic acid       Nutritional status     Body mass index is 33.44 kg/m.  Data Reviewed:   CBC: Recent Labs  Lab 03/20/24 1631 03/21/24 0452  WBC 13.1* 9.6  NEUTROABS 10.5*  --   HGB 9.1* 8.4*  HCT 29.4* 27.4*  MCV 98.7 97.2  PLT 221 197   Basic Metabolic Panel: Recent Labs  Lab 03/20/24 1631 03/21/24 0452  NA 140 141  K 4.7 5.5*  CL 97* 98  CO2 32 32  GLUCOSE 113* 138*  BUN 14 16  CREATININE 1.56* 1.53*  CALCIUM  9.0 8.8*  MG  --  1.7  PHOS  --  4.7*   GFR: Estimated Creatinine Clearance: 30.5 mL/min (A) (by C-G formula based on SCr of 1.53 mg/dL (H)). Liver Function Tests: Recent Labs  Lab 03/20/24 1631  AST 16  ALT 10  ALKPHOS 104  BILITOT 0.2  PROT 6.6  ALBUMIN  3.0*   No results for input(s): LIPASE, AMYLASE in the last 168 hours. No results for input(s): AMMONIA in the last 168 hours. Coagulation Profile: No results for input(s): INR, PROTIME in the last 168 hours. Cardiac Enzymes: No results for input(s): CKTOTAL, CKMB, CKMBINDEX, TROPONINI in the last 168 hours. BNP (last 3 results) No results for input(s): PROBNP in the last 8760 hours. HbA1C: No results for input(s): HGBA1C in the last 72 hours. CBG: No results for input(s): GLUCAP in the last 168 hours. Lipid Profile: No results for input(s): CHOL, HDL,  LDLCALC, TRIG, CHOLHDL, LDLDIRECT in the last 72 hours. Thyroid  Function Tests: No results for input(s): TSH, T4TOTAL, FREET4, T3FREE, THYROIDAB in the last 72 hours. Anemia Panel: Recent Labs    03/21/24 0452  VITAMINB12 662  FOLATE 10.7  FERRITIN 24  TIBC 329  IRON  34   Sepsis Labs: No results for input(s): PROCALCITON, LATICACIDVEN in the last 168 hours.  No results found for this or any previous visit (from the  past 240 hours).       Radiology Studies: CT ANKLE RIGHT WO CONTRAST Result Date: 03/20/2024 CLINICAL DATA:  Fracture of the right tibia and fibula. Surgical planning. EXAM: CT OF THE RIGHT ANKLE WITHOUT CONTRAST TECHNIQUE: Multidetector CT imaging of the right ankle was performed according to the standard protocol. Multiplanar CT image reconstructions were also generated. RADIATION DOSE REDUCTION: This exam was performed according to the departmental dose-optimization program which includes automated exposure control, adjustment of the mA and/or kV according to patient size and/or use of iterative reconstruction technique. COMPARISON:  Earlier radiograph dated 03/20/2024. FINDINGS: Bones/Joint/Cartilage There is a mildly angulated oblique fracture of the distal fibula. There is a comminuted, mildly displaced and angulated fracture of the distal tibial metadiaphysis with extension into the articular surface of the tibial plafond. There is no dislocation. There is severe osteopenia. Ligaments Suboptimally assessed by CT. Muscles and Tendons No acute findings. Soft tissues There is diffuse skin thickening and subcutaneous edema of the distal like and foot. No fluid collection or soft tissue gas. An overlying cast noted. IMPRESSION: 1. Comminuted, mildly displaced and angulated intra-articular fracture of the distal tibial. 2. Mildly angulated oblique fracture of the distal fibula. 3. Severe osteopenia. Electronically Signed   By: Vanetta Chou M.D.   On:  03/20/2024 20:20   DG Ankle 2 Views Right Result Date: 03/20/2024 CLINICAL DATA:  Distal tibial and fibular fractures. EXAM: RIGHT ANKLE - 2 VIEW COMPARISON:  Earlier radiograph dated 03/20/2024. FINDINGS: Slight interval decrease in the angulation of the distal tibial and fibular fractures compared to the earlier radiograph. There has been interval placement of a cast. IMPRESSION: Slight interval decrease in the angulation of the distal tibial and fibular fractures. Electronically Signed   By: Vanetta Chou M.D.   On: 03/20/2024 19:35   DG Ankle Complete Right Result Date: 03/20/2024 CLINICAL DATA:  fall. EXAM: RIGHT ANKLE - COMPLETE 3+ VIEW; RIGHT FOOT COMPLETE - 3+ VIEW COMPARISON:  None Available. FINDINGS: There is acute comminuted/angulated and displaced fracture of the distal right tibia. There is slightly angulated fracture of the distal right fibula also. No other acute fracture or dislocation. No aggressive osseous lesion. There is old fracture versus unfused apophysis along the base of the fifth metatarsal. Ankle mortise appears intact. There are mild to moderate diffuse degenerative changes of imaged joints. Calcaneal spur noted along the Achilles tendon attachment site. Mild diffuse soft tissue swelling noted around the ankle joint. No radiopaque foreign bodies. IMPRESSION: Acute fractures of the distal right tibia and fibula, as described above. Electronically Signed   By: Ree Molt M.D.   On: 03/20/2024 16:17   DG Foot Complete Right Result Date: 03/20/2024 CLINICAL DATA:  fall. EXAM: RIGHT ANKLE - COMPLETE 3+ VIEW; RIGHT FOOT COMPLETE - 3+ VIEW COMPARISON:  None Available. FINDINGS: There is acute comminuted/angulated and displaced fracture of the distal right tibia. There is slightly angulated fracture of the distal right fibula also. No other acute fracture or dislocation. No aggressive osseous lesion. There is old fracture versus unfused apophysis along the base of the fifth  metatarsal. Ankle mortise appears intact. There are mild to moderate diffuse degenerative changes of imaged joints. Calcaneal spur noted along the Achilles tendon attachment site. Mild diffuse soft tissue swelling noted around the ankle joint. No radiopaque foreign bodies. IMPRESSION: Acute fractures of the distal right tibia and fibula, as described above. Electronically Signed   By: Ree Molt M.D.   On: 03/20/2024 16:17  LOS: 1 day   Time spent= 35 mins    Burgess JAYSON Dare, MD [facility]  If 7PM-7AM, please contact night-coverage  03/21/2024, 10:44 AM

## 2024-03-21 NOTE — Anesthesia Preprocedure Evaluation (Addendum)
 Anesthesia Evaluation  Patient identified by MRN, date of birth, ID band Patient awake    Reviewed: Allergy & Precautions, NPO status , Patient's Chart, lab work & pertinent test results  History of Anesthesia Complications Negative for: history of anesthetic complications  Airway Mallampati: IV  TM Distance: >3 FB Neck ROM: Full    Dental  (+) Edentulous Upper, Edentulous Lower, Dental Advisory Given   Pulmonary shortness of breath and Long-Term Oxygen  Therapy, asthma , COPD,  oxygen  dependent, former smoker 45 pack year history, quit 2015    + decreased breath sounds      Cardiovascular hypertension, + CAD, + Past MI and +CHF (LVEF 25-30%)  + Valvular Problems/Murmurs (mild As, mild-mod MR) AS and MR  Rhythm:Regular  Echo 2024 1. There is no left ventricular thrombus (Definity  contrast was used).  The apical myocardium is thin and hyperechoic, consistent with scar. Left  ventricular ejection fraction, by estimation, is 25 to 30%. The left  ventricle has severely decreased  function. The left ventricle demonstrates global hypokinesis. The left  ventricular internal cavity size was moderately dilated. Left ventricular  diastolic parameters are consistent with Grade II diastolic dysfunction  (pseudonormalization). Elevated left  atrial pressure. There is akinesis of the left ventricular, mid-apical  anteroseptal wall, anterior wall and apical segment. There is severe  hypokinesis of the left ventricular, entire inferior wall and  inferolateral wall.   2. Right ventricular systolic function is normal. The right ventricular  size is normal. Tricuspid regurgitation signal is inadequate for assessing  PA pressure.   3. Left atrial size was moderately dilated.   4. Right atrial size was mildly dilated.   5. The mitral valve is possibly rheumatic. Mild to moderate mitral valve  regurgitation. Mild mitral stenosis. The mean mitral  valve gradient is 5.5  mmHg with average heart rate of 77 bpm.   6. The aortic valve is tricuspid. Aortic valve regurgitation is not  visualized. Mild aortic valve stenosis. Aortic valve area, by VTI measures  1.56 cm. Aortic valve mean gradient measures 12.0 mmHg. Aortic valve Vmax  measures 2.10 m/s.   7. The inferior vena cava is dilated in size with <50% respiratory  variability, suggesting right atrial pressure of 15 mmHg.     Neuro/Psych  PSYCHIATRIC DISORDERS Anxiety Depression    negative neurological ROS     GI/Hepatic Neg liver ROS,GERD  Controlled,,  Endo/Other  diabetes    Renal/GU Renal InsufficiencyRenal disease (cr 1.53)K 5.5 this AM     Musculoskeletal  (+) Arthritis , Osteoarthritis,  Right ankle fx   Abdominal   Peds  Hematology  (+) Blood dyscrasia, anemia Hb 8.4, plt 197   Anesthesia Other Findings   Reproductive/Obstetrics                              Anesthesia Physical Anesthesia Plan  ASA: 4  Anesthesia Plan: MAC and Regional   Post-op Pain Management: Regional block* and Precedex    Induction:   PONV Risk Score and Plan: 2 and Treatment may vary due to age or medical condition  Airway Management Planned: Nasal Cannula, Natural Airway and Simple Face Mask  Additional Equipment: None  Intra-op Plan:   Post-operative Plan:   Informed Consent: I have reviewed the patients History and Physical, chart, labs and discussed the procedure including the risks, benefits and alternatives for the proposed anesthesia with the patient or authorized representative who has indicated his/her understanding  and acceptance.     Dental advisory given  Plan Discussed with: CRNA  Anesthesia Plan Comments:         Anesthesia Quick Evaluation

## 2024-03-21 NOTE — Anesthesia Procedure Notes (Signed)
 Anesthesia Regional Block: Popliteal block   Pre-Anesthetic Checklist: , timeout performed,  Correct Patient, Correct Site, Correct Laterality,  Correct Procedure, Correct Position, site marked,  Risks and benefits discussed,  Surgical consent,  Pre-op evaluation,  At surgeon's request and post-op pain management  Laterality: Right and Lower  Prep: chloraprep       Needles:  Injection technique: Single-shot      Needle Length: 9cm  Needle Gauge: 22     Additional Needles: Arrow StimuQuik ECHO Echogenic Stimulating PNB Needle  Procedures:,,,, ultrasound used (permanent image in chart),,    Narrative:  Start time: 03/21/2024 1:24 PM End time: 03/21/2024 1:30 PM Injection made incrementally with aspirations every 5 mL.  Performed by: Personally  Anesthesiologist: Leopoldo Bruckner, MD

## 2024-03-21 NOTE — Op Note (Signed)
 Orthopedic Surgery Operative Report   Procedure: Right pilin fracture open reduction internal fixation Right distal 1/3 tibial shaft fracture open reduction internal fixation Application of incisional vac   Modifier: none   Date of procedure: 03/21/2024   Patient name: Beverly Wheeler   MRN: [id number]  DOB: [birth year]   Surgeon: Ozell Ada, MD Assistant: none Pre-operative diagnosis: right pilon fracture, right distal tibial shaft fracture Post-operative diagnosis: same as above Findings: right displaced pilon fracture, right displaced distal tibial shaft fracture   Specimens: none Anesthesia: general EBL: 50cc Complications: none Pre-incision antibiotic: ancef  TXA given prior to incision as well   Implants:  Implant Name Type Inv. Item Serial No. Manufacturer Lot No. LRB No. Used Action  Ant-D-Tib plate 6H      Right 1 Implanted  SCREW CORTEX 3.5X24MM - [id number] Screw SCREW CORTEX 3.5X24MM  [employer]  Right 3 Implanted  SCREW CTX 3.5X36MM EVOS - [id number] Screw SCREW CTX 3.5X36MM EVOS  [employer]  Right 2 Implanted  SCREW CTX 3.5X38MM EVOS - [id number] Screw SCREW CTX 3.5X38MM EVOS  [employer]  Right 1 Implanted and Explanted  SCREW LOCK EVOS 2.7X32 - [id number] Screw SCREW LOCK EVOS 2.7X32  [employer]  Right 1 Implanted  SCREW LOCK ST EVOS 2.7X36 - [id number] Screw SCREW LOCK ST EVOS 2.7X36  [employer]  Right 1 Implanted  SCREW LOCK T8 38X2.7XST - [id number] Screw SCREW LOCK T8 38X2.7XST  [employer]  Right 1 Implanted  SCREW LOCK 3.5X36MM EVOS - [id number] Screw SCREW LOCK 3.5X36MM EVOS  [employer]  Right 3 Implanted  SCREW LOCK ST EVOS 3.5X38 - [id number] Screw SCREW LOCK ST EVOS 3.5X38  [employer]  Right 2 Implanted  SCREW LOCK CORTICAL 3.5X42 - [id number] Screw SCREW LOCK CORTICAL 3.5X42  [employer]   Right 1 Implanted       Indication for procedure: Patient is a 78 y.o. female who presented to the ER after a ground level fall. The patient had right ankle pain and x-rays revealed a right pilon and a right tibial shaft fracture. The patient was admitted to a medicine service with orthopedics consulted. I met the patient and discussed the fractures. I recommended operative management in the form of open reduction internal fixation of both the tibia and pilon fractures. Explained the risks of this procedure included, but were not limited to: nonunion, malunion, fixation failure, infection, bleeding, stiffness, need for additional procedures, deep vein thrombosis, post traumatic arthritis, pulmonary embolism, MI, arrhythmia, and death. The alternatives of this surgery would be to treat the fracture with immobilization or to perform no intervention. After our discussion, patient elected to proceed with surgery.    Procedure Description: The patient was met in the pre-operative holding area. The patient's identity and consent were verified. The operative site was marked by myself. The patient's remaining questions about the surgery were answered. A block was performed by anesthesia staff. The patient was brought back to the operating room.  She was transferred to the operating room table in the supine position. Anesthesia was induced. All bony prominences were well padded. A pad was placed over the proximal thigh and a tourniquet was placed over it. The surgical area was cleansed with alcohol. Ancef  and TXA were administered by anesthesia. The patient's skin was then prepped and draped in a standard, sterile fashion. A time out was performed that identified the patient, the  procedure, and the operative site. All team members agreed with what was stated in the time out.    An Esmarch was used to exsanguinate the leg and tourniquet was inflated to 250 mmHg.  A longitudinal incision over the tibial crest for an  anterior medial approach was done and it was curved towards the medial malleolus distally.  It was taken down sharply to just medial of the tibialis anterior tendon.  Care was taken to avoid entering or disrupting the peritenon.  The incision was carried down to the level of bone.  A wood handle elevator was used to elevate the periosteum and tibialis anterior tendon medially.  The fracture was visualized within the wound.  A 15 blade was used to clean the fracture edges at both the tibial shaft fracture and the pilon fracture.  A pointed reduction clamp was applied over the distal tibia to reduce the intra-articular plafond fracture.  2 K wires were used to hold the fracture in place.  The clamp was removed.  A plate was selected and placed over the anterior aspect of the tibia.  Fluoroscopy confirmed that the plate would be long enough to span both the plafond fracture and the distal tibial shaft fracture.  Plate was then left in place over the anterior aspect of the tibia.  A clamp was applied over the anterior aspect of the tibia and posterior aspect of the tibia to hold the clamp in place.  Fluoroscopy was used to make adjustments to the plate so that the distal locking holes would be just above the articular surface.  Once that was done, the clamp was secured in place to hold the plate there.  This also helped reduce the tibial shaft fracture and hold it in place.  The cortical screws in the tibial shaft were placed first.  A drill was used to drill bicortically at the proximalmost hole in the plate.  A depth gauge was used to estimate the screw length.  That length 3.5 mm cortical screw was then placed until there was good purchase.  The same process was repeated to place 2 more cortical 3.5 millimeter screws in the tibial shaft above the fracture.  Attention was then turned to the screws distal to the fracture.  At the third row of screws and the most lateral, the drill was used to drill bicortically.  Depth  gauge was used to estimate the length of the screw.  That length 3.5 mm cortical screw was placed till there is good purchase.  The same process was repeated for the most medial hole and that role of the plate.  When the screw was placed though, there was not as good of purchase so it was swapped for a locking screw of the same size.  In the second most distal row, decision was made to place 3.5 mm locking screws.  All 3 holes were planned to be filled.  A drill was used to drill bicortically.  A depth gauge was used to estimate the length of screw.  Then a 3.5 mm locking screw was placed.  This was done 2 more x 2 placed all 3 screws in that second to last row.  Then, attention was turned to the distalmost row which was for 2.7 mm locking screws.  A locking guide was placed onto the distal screw hole and then a drill was used to drill bicortically under lateral fluoroscopic guidance to avoid entering the intra-articular space..  A depth gauge was used to  estimate the screw length.  That length 2.7 mm locking screw was placed.  The same process was repeated for 2 more additional screws in the distal most row of the plate.  AP and lateral fluoroscopic images were obtained which showed satisfactory reduction of both the pilon and tibial shaft fractures.  It also showed satisfactory position of the plate and screws.  There was a larger intra-articular fracture that involve the medial malleolus than anticipated.  In order to stabilize this, two lag screws were planned to be placed.  Fluoroscopy was brought into an AP view to drill perpendicular to the fracture and joint surface.  First, a 3.5 mm drill was used to drill unit cortically up to the fracture.  Then, a 2.7 mm drill was used to drill through the bone and passed the fracture into the second cortex.  A depth gauge was used to measure the screw length.  That length 3.5 mm cortical screw was then placed into the drill hole.  The same process was repeated to place  a second lag screw by technique distal to the first one.  Final AP and lateral fluoroscopic images confirmed satisfactory reduction and placement of the fixation.  The wound was copiously irrigated with sterile saline.  The deep dermal layer was closed with 2-0 Vicryl.  The skin was closed with 2-0 nylon in a horizontal mattress fashion.  A Prevena incisional VAC was placed over the incision.  There was good suction and no evidence of leak.  All counts were correct at the end the case.  Patient was transferred back to a hospital bed.  The patient was awakened from anesthesia and transferred back to the postanesthesia care unit in stable condition.  Post-operative plan: The patient will recover in the post-anesthesia care unit and then go to the floor on the medicine service. The patient will receive two post-operative doses of ancef . She will get another dose of TXA.  The incisional VAC will remain in place until discharge at which point will be transition to a home unit.  It should be on -125 mmHg continuous setting.  The patient will be nonweightbearing on the right lower extremity.  The patient will work with physical therapy.  It is likely that she will need to go to a SNF or rehab at discharge.    Ozell Ada, MD Orthopedic Surgeon

## 2024-03-21 NOTE — Brief Op Note (Signed)
 03/21/2024  5:19 PM  PATIENT:  Beverly Wheeler  78 y.o. female  PRE-OPERATIVE DIAGNOSIS:  right pilon fracture, right tibia fracture  POST-OPERATIVE DIAGNOSIS:  same as above  PROCEDURE:  Procedure(s): OPEN REDUCTION INTERNAL FIXATION (ORIF) ANKLE FRACTURE (Right)  SURGEON:  Surgeons and Role:    DEWAINE Georgina Ozell DELENA, MD - Primary  PHYSICIAN ASSISTANT: none  ASSISTANTS: none   ANESTHESIA:   general with block  EBL:  50cc   BLOOD ADMINISTERED:none  DRAINS: none   LOCAL MEDICATIONS USED:  NONE  SPECIMEN:  No Specimen  DISPOSITION OF SPECIMEN:  N/A  COUNTS:  YES  TOURNIQUET:   Total Tourniquet Time Documented: Thigh (Right) - 121 minutes Total: Thigh (Right) - 121 minutes   DICTATION: .Note written in EPIC  PLAN OF CARE: Admit to inpatient   PATIENT DISPOSITION:  PACU - hemodynamically stable.   Delay start of Pharmacological VTE agent (>24hrs) due to surgical blood loss or risk of bleeding: no

## 2024-03-21 NOTE — Hospital Course (Addendum)
 Beverly Wheeler is a 78 y.o. year old female with PMH of chronic hypoxic respiratory failure 2 L nasal cannula, COPD, CAD, CHF with reduced EF, CKD 3A, HTN, HLD, DM 2, iron  deficiency anemia presented with ground-level fall causing right distal tibial/fibular fracture.  S/p ORIF on 8/28. postop course complicated by concerns of weakness and confusion therefore CT head performed which was unremarkable and was also seen by neurology service. PT/OT is recommending SNF, and has been stable awaiting for placement.  Admitted day: 03/20/2024 Discharge date 03/27/2024  Subjective: Seen and examined today No new complaints,pain controlled. Overnight afebrile BP stable Labs reviewed this morning overall unchanged  Discharge diagnosis:  Closed R distal tibia / fibular fracture 2/2 fall: S/p ORIF by ortho on 8/28.  Continue multimodal pain management, Xarelto  for DVT prophylaxis, at this time waiting for placement to SNF   Progressive weakness and confusion CT head no acute finding, seen by neurology service, overall stable    Leukocytosis  Resolved    COPD Chronic hypoxic respiratory failure Not in exacerbation continue home meds.  Continue supplemental oxygen   CAD, hx of prior MI Hx of multivessel CAD and referred for CABG in '15 but declined. Continue home statin, zetia , aspirin   HFrEF  Last TTE 9/'24 with LVEF 25-30%, global hypokinesis with local WM abnormalities, Gr 2 DD, biatrial dilation, moderate MR, mild AS. Temporarily pause lasix  (takes three times per week) and Farxiga  preop. Hold Losartan  preop, resume after. Continue home Spironolactone , Bystolic .  Follow-up outpatient [facility] cardiology.  Due for outptn follow up and Repeat Echo.   CKD stage IIIa HyperKalemia-resolved Baseline Cr near 1.2 - 1.4. On admission is 1.5, continue to monitor. Stable.  HTN Well-controlled cont gdmt  HLD Cont statins  DM type 2 Cont ssi  IDA Hb near baseline ~ 9. Borderline macrocytic. R B12  and folate are normal Borderline low ferritin and iron  saturation Postoperatively hemoglobin drifted down to 7.0.  Received 1 dose of IV iron  and 1 unit of PRBC.  Hemoglobin is now stable.  Mood d/o Continue home lexapro    Vitamin D  deficiency Weekly supplements added  Class I Obesity w/ Body mass index is 33.32 kg/m.: Will benefit with PCP follow-up, weight loss,healthy lifestyle and outpatient sleep eval if not done.  Mobility: PT Orders: Active  PT Follow up Rec: Skilled Nursing-Short Term Rehab (<3 Hours/Day)03/26/2024 1330   DVT prophylaxis: rivaroxaban  (XARELTO ) tablet 10 mg Start: 03/22/24 1000 SCDs Start: 03/20/24 2104 Code Status:   Code Status: Full Code Family Communication: plan of care discussed with patient at bedside. Patient status is: Remains hospitalized because of severity of illness Level of care: Med-Surg   Dispo: The patient is from: home            Anticipated disposition: SNF today Objective: Vitals last 24 hrs: Vitals:   03/26/24 1621 03/26/24 2010 03/27/24 0520 03/27/24 0733  BP: (!) 157/55 (!) 143/44 (!) 122/45 (!) 128/52  Pulse: (!) 57 65 (!) 57 60  Resp: 16 18 18 16   Temp: 98.7 F (37.1 C) 98.4 F (36.9 C) 98 F (36.7 C) (!) 97.5 F (36.4 C)  TempSrc: Oral   Oral  SpO2: 97% 93% 98% 98%  Weight:      Height:        Physical Examination: General exam: alert awake, oriented, older than stated age HEENT:Oral mucosa moist, Ear/Nose WNL grossly Respiratory system: Bilaterally clear BS,no use of accessory muscle Cardiovascular system: S1 & S2 +, No JVD. Gastrointestinal system: Abdomen  soft,NT,ND, BS+ Nervous System: Alert, awake, moving all extremities,and following commands. Extremities: LE edema neg, distal extremities warm.  Right ankle with dressing/wound VAC Skin: No rashes,no icterus. MSK: Normal muscle bulk,tone, power

## 2024-03-22 ENCOUNTER — Inpatient Hospital Stay (HOSPITAL_COMMUNITY)

## 2024-03-22 ENCOUNTER — Other Ambulatory Visit (HOSPITAL_BASED_OUTPATIENT_CLINIC_OR_DEPARTMENT_OTHER)

## 2024-03-22 DIAGNOSIS — R410 Disorientation, unspecified: Secondary | ICD-10-CM

## 2024-03-22 DIAGNOSIS — R29810 Facial weakness: Secondary | ICD-10-CM | POA: Diagnosis not present

## 2024-03-22 DIAGNOSIS — W19XXXA Unspecified fall, initial encounter: Secondary | ICD-10-CM | POA: Diagnosis not present

## 2024-03-22 DIAGNOSIS — I639 Cerebral infarction, unspecified: Secondary | ICD-10-CM | POA: Diagnosis not present

## 2024-03-22 DIAGNOSIS — S82891D Other fracture of right lower leg, subsequent encounter for closed fracture with routine healing: Secondary | ICD-10-CM

## 2024-03-22 DIAGNOSIS — R29818 Other symptoms and signs involving the nervous system: Secondary | ICD-10-CM | POA: Diagnosis not present

## 2024-03-22 DIAGNOSIS — R29702 NIHSS score 2: Secondary | ICD-10-CM | POA: Diagnosis not present

## 2024-03-22 DIAGNOSIS — I6523 Occlusion and stenosis of bilateral carotid arteries: Secondary | ICD-10-CM | POA: Diagnosis not present

## 2024-03-22 LAB — COMPREHENSIVE METABOLIC PANEL WITH GFR
ALT: 7 U/L (ref 0–44)
AST: 17 U/L (ref 15–41)
Albumin: 2.6 g/dL — ABNORMAL LOW (ref 3.5–5.0)
Alkaline Phosphatase: 86 U/L (ref 38–126)
Anion gap: 11 (ref 5–15)
BUN: 21 mg/dL (ref 8–23)
CO2: 33 mmol/L — ABNORMAL HIGH (ref 22–32)
Calcium: 8.4 mg/dL — ABNORMAL LOW (ref 8.9–10.3)
Chloride: 95 mmol/L — ABNORMAL LOW (ref 98–111)
Creatinine, Ser: 1.66 mg/dL — ABNORMAL HIGH (ref 0.44–1.00)
GFR, Estimated: 32 mL/min — ABNORMAL LOW (ref >60)
Glucose, Bld: 146 mg/dL — ABNORMAL HIGH (ref 70–99)
Potassium: 5 mmol/L (ref 3.5–5.1)
Sodium: 139 mmol/L (ref 135–145)
Total Bilirubin: 0.3 mg/dL (ref 0.0–1.2)
Total Protein: 5.9 g/dL — ABNORMAL LOW (ref 6.5–8.1)

## 2024-03-22 LAB — CBC
HCT: 26.6 % — ABNORMAL LOW (ref 36.0–46.0)
Hemoglobin: 8.2 g/dL — ABNORMAL LOW (ref 12.0–15.0)
MCH: 30.1 pg (ref 26.0–34.0)
MCHC: 30.8 g/dL (ref 30.0–36.0)
MCV: 97.8 fL (ref 80.0–100.0)
Platelets: 192 K/uL (ref 150–400)
RBC: 2.72 MIL/uL — ABNORMAL LOW (ref 3.87–5.11)
RDW: 13.2 % (ref 11.5–15.5)
WBC: 12.1 K/uL — ABNORMAL HIGH (ref 4.0–10.5)
nRBC: 0 % (ref 0.0–0.2)

## 2024-03-22 LAB — BASIC METABOLIC PANEL WITH GFR
Anion gap: 11 (ref 5–15)
BUN: 19 mg/dL (ref 8–23)
CO2: 32 mmol/L (ref 22–32)
Calcium: 8.8 mg/dL — ABNORMAL LOW (ref 8.9–10.3)
Chloride: 97 mmol/L — ABNORMAL LOW (ref 98–111)
Creatinine, Ser: 1.63 mg/dL — ABNORMAL HIGH (ref 0.44–1.00)
GFR, Estimated: 33 mL/min — ABNORMAL LOW (ref >60)
Glucose, Bld: 124 mg/dL — ABNORMAL HIGH (ref 70–99)
Potassium: 5.8 mmol/L — ABNORMAL HIGH (ref 3.5–5.1)
Sodium: 140 mmol/L (ref 135–145)

## 2024-03-22 LAB — CBC WITH DIFFERENTIAL/PLATELET
Abs Immature Granulocytes: 0.05 K/uL (ref 0.00–0.07)
Basophils Absolute: 0.1 K/uL (ref 0.0–0.1)
Basophils Relative: 1 %
Eosinophils Absolute: 0 K/uL (ref 0.0–0.5)
Eosinophils Relative: 0 %
HCT: 23.6 % — ABNORMAL LOW (ref 36.0–46.0)
Hemoglobin: 7.5 g/dL — ABNORMAL LOW (ref 12.0–15.0)
Immature Granulocytes: 1 %
Lymphocytes Relative: 14 %
Lymphs Abs: 1.3 K/uL (ref 0.7–4.0)
MCH: 30.5 pg (ref 26.0–34.0)
MCHC: 31.8 g/dL (ref 30.0–36.0)
MCV: 95.9 fL (ref 80.0–100.0)
Monocytes Absolute: 0.8 K/uL (ref 0.1–1.0)
Monocytes Relative: 8 %
Neutro Abs: 7.4 K/uL (ref 1.7–7.7)
Neutrophils Relative %: 76 %
Platelets: 173 K/uL (ref 150–400)
RBC: 2.46 MIL/uL — ABNORMAL LOW (ref 3.87–5.11)
RDW: 13.2 % (ref 11.5–15.5)
WBC: 9.6 K/uL (ref 4.0–10.5)
nRBC: 0 % (ref 0.0–0.2)

## 2024-03-22 LAB — GLUCOSE, CAPILLARY
Glucose-Capillary: 127 mg/dL — ABNORMAL HIGH (ref 70–99)
Glucose-Capillary: 114 mg/dL — ABNORMAL HIGH (ref 70–99)

## 2024-03-22 LAB — LACTIC ACID, PLASMA: Lactic Acid, Venous: 0.7 mmol/L (ref 0.5–1.9)

## 2024-03-22 LAB — MAGNESIUM: Magnesium: 1.7 mg/dL (ref 1.7–2.4)

## 2024-03-22 MED ORDER — OXYCODONE HCL 5 MG PO TABS: 5.0000 mg | ORAL_TABLET | ORAL | 0 refills | Status: DC | PRN

## 2024-03-22 MED ORDER — SODIUM ZIRCONIUM CYCLOSILICATE 10 G PO PACK
10.0000 g | PACK | Freq: Three times a day (TID) | ORAL | Status: AC
  Administered 2024-03-22 (×2): 10 g via ORAL
  Filled 2024-03-22 (×3): qty 1

## 2024-03-22 MED ORDER — RIVAROXABAN 10 MG PO TABS: 10.0000 mg | ORAL_TABLET | Freq: Every day | ORAL | 0 refills | Status: DC

## 2024-03-22 MED ORDER — ACETAMINOPHEN 500 MG PO TABS: 1000.0000 mg | ORAL_TABLET | Freq: Three times a day (TID) | ORAL | 0 refills | Status: AC

## 2024-03-22 MED ORDER — MELATONIN 3 MG PO TABS
6.0000 mg | ORAL_TABLET | Freq: Every day | ORAL | Status: DC
  Administered 2024-03-22 – 2024-03-26 (×5): 6 mg via ORAL
  Filled 2024-03-22 (×5): qty 2

## 2024-03-22 MED ORDER — RIVAROXABAN 10 MG PO TABS
10.0000 mg | ORAL_TABLET | Freq: Every day | ORAL | Status: DC
  Administered 2024-03-22 – 2024-03-27 (×6): 10 mg via ORAL
  Filled 2024-03-22 (×6): qty 1

## 2024-03-22 MED ORDER — CEFAZOLIN SODIUM-DEXTROSE 2-4 GM/100ML-% IV SOLN
2.0000 g | Freq: Two times a day (BID) | INTRAVENOUS | Status: AC
  Administered 2024-03-22 (×2): 2 g via INTRAVENOUS
  Filled 2024-03-22 (×2): qty 100

## 2024-03-22 MED ORDER — POLYETHYLENE GLYCOL 3350 17 G PO PACK: 17.0000 g | PACK | Freq: Every day | ORAL | 0 refills | Status: AC

## 2024-03-22 MED ORDER — GLUCERNA SHAKE PO LIQD
237.0000 mL | Freq: Three times a day (TID) | ORAL | Status: DC
  Administered 2024-03-22 – 2024-03-26 (×12): 237 mL via ORAL

## 2024-03-22 MED ORDER — SENNOSIDES-DOCUSATE SODIUM 8.6-50 MG PO TABS: 1.0000 | ORAL_TABLET | Freq: Two times a day (BID) | ORAL | 0 refills | Status: AC

## 2024-03-22 NOTE — Progress Notes (Signed)
 CSW spoke with pt and son Beverly Wheeler about SDOH: utilities. They report they have been behind on utility bills in the past, have been able to catch up. They are not in danger of any shutoffs currently.  Discussed community resources at [employer] and DSS that can assist, list of community resources provided. Beverly Wheeler, MSW, LCSW 8/29/20252:20 PM

## 2024-03-22 NOTE — Progress Notes (Addendum)
 PROGRESS NOTE    Beverly Wheeler  FMW:[id number] DOB: 1945/08/09 DOA: 03/20/2024 PCP: [place], Betty G, MD    Brief Narrative:  78 year old with history of chronic hypoxic respiratory failure 2 L nasal cannula, COPD, CAD, CHF with reduced EF, CKD 3A, HTN, HLD, DM 2, iron  deficiency anemia presented with ground-level fall causing right distal tibial/fibular fracture.  Orthopedic consulted.   Assessment & Plan:  Principal Problem:   Closed right ankle fracture Active Problems:   Acute encephalopathy   Goals of care, counseling/discussion   Leukocytosis     Ground level fall  C/b closed R distal tibia / fibular fracture  S/p ORIF by ortho on 8/28. Post Op management per their service. Cont wound Vac. Post Op DVT ppx Xarelto .  - Pain control, bowel regimen   Leukocytosis  Reactive.  Already resolved   Mild encephalopathy  Improving.  Continue to closely monitor  Addendum some slurred speech slightly more than normal per son, therefore will order MRI Brain to rule out any CVA due to her risk factors.    Goals of care  She was uncertain re: code status, would like to be full code for now while thinking about this more. Has declined definitive management of her CAD in the past. With her advanced comorbidities recovery during resuscitation is expected to be poor which I discussed with patient  -- For now remains as full code. Continue goals of care discussions.     COPD  Without acute exacerbation, sub home dulera  (not taking PTA), montelukast . Nebs prn   CHRF on 2L O2  continue home O2.   CAD, hx of prior MI  Hx of multivessel CAD and referred for CABG in '15 but declined. Hold aspirin  periop resume as soon as able. Continue home statin, zetia    HFrEF  Last TTE 9/'24 with LVEF 25-30%, global hypokinesis with local WM abnormalities, Gr 2 DD, biatrial dilation, moderate MR, mild AS. Temporarily pause lasix  (takes three times per week) and Farxiga  preop. Hold Losartan  preop,  resume after. Continue home Spironolactone , Bystolic .  Follow-up outpatient [facility] cardiology. Due for outptn follow up and Repeat Echo.   CKD stage IIIa HyperKalemia  Baseline Cr near 1.2 - 1.4. On admission is 1.5, continue to monitor.  Lokelma   HTN  See GDMT meds above   HLD  See CAD above   DM type 2  No recent A1c, check. Holding home PO meds, not currently requiring SSI   IDA  Hb near baseline ~ 9. Borderline macrocytic. Repeat iron  panel, B12, Folate   Mood d/o  Continue home lexapro    Vitamin D  deficiency - Weekly supplements added   DVT prophylaxis: Xarelto     Code Status: Full Code Family Communication:   Status is: Inpatient Remains inpatient appropriate because: Dispo pending PT eval   PT Follow up Recs:   Subjective: Seen at bedside, reporting of right ankle pain at the surgical site as expected Examination:  General exam: Appears calm and comfortable  Respiratory system: Clear to auscultation. Respiratory effort normal. Cardiovascular system: S1 & S2 heard, RRR. No JVD, murmurs, rubs, gallops or clicks. No pedal edema. Gastrointestinal system: Abdomen is nondistended, soft and nontender. No organomegaly or masses felt. Normal bowel sounds heard. Central nervous system: Alert and oriented. No focal neurological deficits. Extremities: Symmetric 5 x 5 power. Skin: RLE dressing in place.  Psychiatry: Judgement and insight appear normal. Mood & affect appropriate.  Diet Orders (From admission, onward)     Start     Ordered   03/21/24 1854  Diet Carb Modified Fluid consistency: Thin  Diet effective now       Question Answer Comment  Calorie Level Medium 1600-2000   Fluid consistency: Thin      03/21/24 1853            Objective: Vitals:   03/22/24 0351 03/22/24 0848 03/22/24 0857 03/22/24 1305  BP: (!) 130/91  (!) 126/49 (!) 126/100  Pulse: 70   63  Resp: 15  14   Temp: 98.7 F (37.1 C)  98.8 F (37.1 C)    TempSrc: Oral     SpO2: 95% 96% (!) 85% 93%  Weight:      Height:        Intake/Output Summary (Last 24 hours) at 03/22/2024 1429 Last data filed at 03/22/2024 0300 Gross per 24 hour  Intake 1170 ml  Output --  Net 1170 ml   Filed Weights   03/20/24 1531 03/21/24 1220 03/21/24 1245  Weight: 80.3 kg (P) 80.3 kg 80 kg    Scheduled Meds:  atorvastatin   80 mg Oral QHS   Chlorhexidine  Gluconate Cloth  6 each Topical Daily   cyanocobalamin   1,000 mcg Oral Daily   escitalopram   20 mg Oral Daily   ezetimibe   10 mg Oral Daily   feeding supplement (GLUCERNA SHAKE)  237 mL Oral TID BM   ferrous gluconate   324 mg Oral BID WC   fluticasone  furoate-vilanterol  1 puff Inhalation Daily   montelukast   10 mg Oral QHS   mupirocin  ointment  1 Application Nasal BID   nebivolol   5 mg Oral Daily   ondansetron  (ZOFRAN ) IV  4 mg Intravenous Once   rivaroxaban   10 mg Oral Daily   sodium chloride  flush  3 mL Intravenous Q12H   sodium zirconium cyclosilicate   10 g Oral TID   spironolactone   12.5 mg Oral Daily   Vitamin D  (Ergocalciferol )  50,000 Units Oral Q7 days   Continuous Infusions:   ceFAZolin  (ANCEF ) IV 2 g (03/22/24 1128)    Nutritional status     Body mass index is 33.32 kg/m.  Data Reviewed:   CBC: Recent Labs  Lab 03/20/24 1631 03/21/24 0452 03/21/24 1247 03/22/24 0503  WBC 13.1* 9.6  --  12.1*  NEUTROABS 10.5*  --   --   --   HGB 9.1* 8.4* 8.8* 8.2*  HCT 29.4* 27.4* 26.0* 26.6*  MCV 98.7 97.2  --  97.8  PLT 221 197  --  192   Basic Metabolic Panel: Recent Labs  Lab 03/20/24 1631 03/21/24 0452 03/21/24 1247 03/22/24 0503  NA 140 141 138 140  K 4.7 5.5* 5.5* 5.8*  CL 97* 98 96* 97*  CO2 32 32  --  32  GLUCOSE 113* 138* 132* 124*  BUN 14 16 19 19   CREATININE 1.56* 1.53* 1.60* 1.63*  CALCIUM  9.0 8.8*  --  8.8*  MG  --  1.7  --  1.7  PHOS  --  4.7*  --   --    GFR: Estimated Creatinine Clearance: 28.6 mL/min (A) (by C-G formula based on SCr of 1.63 mg/dL  (H)). Liver Function Tests: Recent Labs  Lab 03/20/24 1631  AST 16  ALT 10  ALKPHOS 104  BILITOT 0.2  PROT 6.6  ALBUMIN  3.0*   No results for input(s): LIPASE, AMYLASE in the last 168 hours. No results for input(s): AMMONIA  in the last 168 hours. Coagulation Profile: No results for input(s): INR, PROTIME in the last 168 hours. Cardiac Enzymes: No results for input(s): CKTOTAL, CKMB, CKMBINDEX, TROPONINI in the last 168 hours. BNP (last 3 results) No results for input(s): PROBNP in the last 8760 hours. HbA1C: No results for input(s): HGBA1C in the last 72 hours. CBG: Recent Labs  Lab 03/21/24 1128 03/21/24 1235 03/21/24 1608 03/21/24 1712  GLUCAP 132* 137* 152* 145*   Lipid Profile: No results for input(s): CHOL, HDL, LDLCALC, TRIG, CHOLHDL, LDLDIRECT in the last 72 hours. Thyroid  Function Tests: No results for input(s): TSH, T4TOTAL, FREET4, T3FREE, THYROIDAB in the last 72 hours. Anemia Panel: Recent Labs    03/21/24 0452  VITAMINB12 662  FOLATE 10.7  FERRITIN 24  TIBC 329  IRON  34   Sepsis Labs: No results for input(s): PROCALCITON, LATICACIDVEN in the last 168 hours.  Recent Results (from the past 240 hours)  Surgical pcr screen     Status: Abnormal   Collection Time: 03/21/24  8:00 AM   Specimen: Nasal Mucosa; Nasal Swab  Result Value Ref Range Status   MRSA, PCR NEGATIVE NEGATIVE Final   Staphylococcus aureus POSITIVE (A) NEGATIVE Final    Comment: (NOTE) The Xpert SA Assay (FDA approved for NASAL specimens in patients 62 years of age and older), is one component of a comprehensive surveillance program. It is not intended to diagnose infection nor to guide or monitor treatment. Performed at [facility], [address], [place], [place] [address]          Radiology Studies: DG Ankle Complete Right Result Date: 03/21/2024 CLINICAL DATA:  Postoperative status. EXAM: RIGHT ANKLE - COMPLETE  3+ VIEW COMPARISON:  March 20, 2024. FINDINGS: Status post surgical internal fixation of comminuted distal right tibial fracture. Nondisplaced distal right fibular fracture is noted. Expected postoperative changes seen in the surrounding soft tissues. IMPRESSION: Status post surgical internal fixation of distal right tibial fracture. Electronically Signed   By: Lynwood Landy Raddle M.D.   On: 03/21/2024 18:22   DG Ankle Complete Right Result Date: 03/21/2024 CLINICAL DATA:  Open reduction and internal fixation of right tibial fracture. EXAM: RIGHT ANKLE - COMPLETE 3+ VIEW; DG C-ARM 1-60 MIN-NO REPORT Radiation exposure index: 4.84 mGy. COMPARISON:  March 20, 2024. FINDINGS: Three intraoperative fluoroscopic images were obtained of the right ankle. These demonstrate surgical fixation of comminuted distal right tibial fracture. IMPRESSION: Fluoroscopic guidance provided during surgical fixation of distal right tibial fracture. Electronically Signed   By: Lynwood Landy Raddle M.D.   On: 03/21/2024 17:02   DG C-Arm 1-60 Min-No Report Result Date: 03/21/2024 CLINICAL DATA:  Open reduction and internal fixation of right tibial fracture. EXAM: RIGHT ANKLE - COMPLETE 3+ VIEW; DG C-ARM 1-60 MIN-NO REPORT Radiation exposure index: 4.84 mGy. COMPARISON:  March 20, 2024. FINDINGS: Three intraoperative fluoroscopic images were obtained of the right ankle. These demonstrate surgical fixation of comminuted distal right tibial fracture. IMPRESSION: Fluoroscopic guidance provided during surgical fixation of distal right tibial fracture. Electronically Signed   By: Lynwood Landy Raddle M.D.   On: 03/21/2024 17:02   DG C-Arm 1-60 Min-No Report Result Date: 03/21/2024 CLINICAL DATA:  Open reduction and internal fixation of right tibial fracture. EXAM: RIGHT ANKLE - COMPLETE 3+ VIEW; DG C-ARM 1-60 MIN-NO REPORT Radiation exposure index: 4.84 mGy. COMPARISON:  March 20, 2024. FINDINGS: Three intraoperative fluoroscopic images were obtained  of the right ankle. These demonstrate surgical fixation of comminuted distal right tibial  fracture. IMPRESSION: Fluoroscopic guidance provided during surgical fixation of distal right tibial fracture. Electronically Signed   By: Lynwood Landy Raddle M.D.   On: 03/21/2024 17:02   DG C-Arm 1-60 Min-No Report Result Date: 03/21/2024 CLINICAL DATA:  Open reduction and internal fixation of right tibial fracture. EXAM: RIGHT ANKLE - COMPLETE 3+ VIEW; DG C-ARM 1-60 MIN-NO REPORT Radiation exposure index: 4.84 mGy. COMPARISON:  March 20, 2024. FINDINGS: Three intraoperative fluoroscopic images were obtained of the right ankle. These demonstrate surgical fixation of comminuted distal right tibial fracture. IMPRESSION: Fluoroscopic guidance provided during surgical fixation of distal right tibial fracture. Electronically Signed   By: Lynwood Landy Raddle M.D.   On: 03/21/2024 17:02   CT ANKLE RIGHT WO CONTRAST Result Date: 03/20/2024 CLINICAL DATA:  Fracture of the right tibia and fibula. Surgical planning. EXAM: CT OF THE RIGHT ANKLE WITHOUT CONTRAST TECHNIQUE: Multidetector CT imaging of the right ankle was performed according to the standard protocol. Multiplanar CT image reconstructions were also generated. RADIATION DOSE REDUCTION: This exam was performed according to the departmental dose-optimization program which includes automated exposure control, adjustment of the mA and/or kV according to patient size and/or use of iterative reconstruction technique. COMPARISON:  Earlier radiograph dated 03/20/2024. FINDINGS: Bones/Joint/Cartilage There is a mildly angulated oblique fracture of the distal fibula. There is a comminuted, mildly displaced and angulated fracture of the distal tibial metadiaphysis with extension into the articular surface of the tibial plafond. There is no dislocation. There is severe osteopenia. Ligaments Suboptimally assessed by CT. Muscles and Tendons No acute findings. Soft tissues There is diffuse  skin thickening and subcutaneous edema of the distal like and foot. No fluid collection or soft tissue gas. An overlying cast noted. IMPRESSION: 1. Comminuted, mildly displaced and angulated intra-articular fracture of the distal tibial. 2. Mildly angulated oblique fracture of the distal fibula. 3. Severe osteopenia. Electronically Signed   By: Vanetta Chou M.D.   On: 03/20/2024 20:20   DG Ankle 2 Views Right Result Date: 03/20/2024 CLINICAL DATA:  Distal tibial and fibular fractures. EXAM: RIGHT ANKLE - 2 VIEW COMPARISON:  Earlier radiograph dated 03/20/2024. FINDINGS: Slight interval decrease in the angulation of the distal tibial and fibular fractures compared to the earlier radiograph. There has been interval placement of a cast. IMPRESSION: Slight interval decrease in the angulation of the distal tibial and fibular fractures. Electronically Signed   By: Vanetta Chou M.D.   On: 03/20/2024 19:35   DG Ankle Complete Right Result Date: 03/20/2024 CLINICAL DATA:  fall. EXAM: RIGHT ANKLE - COMPLETE 3+ VIEW; RIGHT FOOT COMPLETE - 3+ VIEW COMPARISON:  None Available. FINDINGS: There is acute comminuted/angulated and displaced fracture of the distal right tibia. There is slightly angulated fracture of the distal right fibula also. No other acute fracture or dislocation. No aggressive osseous lesion. There is old fracture versus unfused apophysis along the base of the fifth metatarsal. Ankle mortise appears intact. There are mild to moderate diffuse degenerative changes of imaged joints. Calcaneal spur noted along the Achilles tendon attachment site. Mild diffuse soft tissue swelling noted around the ankle joint. No radiopaque foreign bodies. IMPRESSION: Acute fractures of the distal right tibia and fibula, as described above. Electronically Signed   By: Ree Molt M.D.   On: 03/20/2024 16:17   DG Foot Complete Right Result Date: 03/20/2024 CLINICAL DATA:  fall. EXAM: RIGHT ANKLE - COMPLETE 3+ VIEW;  RIGHT FOOT COMPLETE - 3+ VIEW COMPARISON:  None Available. FINDINGS: There is acute comminuted/angulated and  displaced fracture of the distal right tibia. There is slightly angulated fracture of the distal right fibula also. No other acute fracture or dislocation. No aggressive osseous lesion. There is old fracture versus unfused apophysis along the base of the fifth metatarsal. Ankle mortise appears intact. There are mild to moderate diffuse degenerative changes of imaged joints. Calcaneal spur noted along the Achilles tendon attachment site. Mild diffuse soft tissue swelling noted around the ankle joint. No radiopaque foreign bodies. IMPRESSION: Acute fractures of the distal right tibia and fibula, as described above. Electronically Signed   By: Ree Molt M.D.   On: 03/20/2024 16:17           LOS: 2 days   Time spent= 35 mins    Burgess JAYSON Dare, MD [facility]  If 7PM-7AM, please contact night-coverage  03/22/2024, 2:29 PM

## 2024-03-22 NOTE — Progress Notes (Signed)
 Pts son had some concerns. MD notified and answered his questions.

## 2024-03-22 NOTE — Progress Notes (Signed)
     Patient Name: Beverly Wheeler           DOB: Jan 12, 1946  MRN: [id number]      Admission Date: 03/20/2024  Attending Provider: Caleen Burgess BROCKS, MD  Primary Diagnosis: Closed right ankle fracture   Level of care: Med-Surg   OVERNIGHT EVENT   Notified by rapid response RN that a rapid was called on this pt.  Reason: progressive weakness to the extent that patient is now dropping objects.  CBG normal.  Confused.   Due to patient being on Xarelto , head CT was recommended by neurology.  CT head negative for acute hemorrhage.   After CT, temperature found to be 100.7.  Prior WBC elevated.   Patient denies new symptoms of chills, cough, sputum production, sore throat, urinary changes, abdominal changes, nausea, vomiting, diarrhea.   Plan: Added orders for CBC, lactic acid , CMP, UA Continue incentive spirometry use.  Monitor for fever, WBC trend.   Margaret Staggs, DNP, ACNPC- AG [facility] [place]

## 2024-03-22 NOTE — [place] FL2 (Signed)
 [place]  MEDICAID FL2 LEVEL OF CARE FORM     IDENTIFICATION  Patient Name: Beverly Wheeler Birthdate: 09/26/1945 Sex: female Admission Date (Current Location): 03/20/2024  [facility] and [place] Number:  Producer, television/film/video and Address:  [facility] [place], [address] [place], [place] [address]      Provider Number: [id number]  Attending Physician Name and Address:  Caleen Burgess BROCKS, MD  Relative Name and Phone Number:  Yelena, Metzer [phone]  [phone]    Current Level of Care: Hospital Recommended Level of Care: Skilled Nursing Facility Prior Approval Number:    Date Approved/Denied:   PASRR Number: [id number]  Discharge Plan: SNF    Current Diagnoses: Patient Active Problem List   Diagnosis Date Noted   Closed fracture of distal end of right fibula and tibia 03/21/2024   Displaced pilon fracture of right tibia, initial encounter for closed fracture 03/21/2024   Closed right ankle fracture 03/20/2024   Acute encephalopathy 03/20/2024   Goals of care, counseling/discussion 03/20/2024   Leukocytosis 03/20/2024   HFrEF (heart failure with reduced ejection fraction) (HCC) 05/12/2023   Ischemic cardiomyopathy 04/12/2023   COPD exacerbation (HCC) 04/08/2023   Pyuria 01/14/2022   Chronic respiratory failure with hypoxia and hypercapnia (HCC) 01/14/2022   Hyperammonemia (HCC) 01/14/2022   Diabetes mellitus (HCC) 05/19/2020   Venous stasis dermatitis of both lower extremities 01/08/2018   Iron  deficiency anemia 01/08/2018   Acute respiratory failure with hypoxia (HCC)    Hypokalemia    Pressure injury of skin 11/24/2017   Acute kidney injury (HCC)    Cellulitis of left lower extremity    Sepsis (HCC)    Septic shock (HCC)    Hypovolemic shock (HCC)    Hypotension 11/23/2017   CKD (chronic kidney disease), stage III 11/22/2017   CAD, multiple vessel 10/30/2013   Pre-op evaluation 10/17/2013   Myocardial infarction (HCC) 10/16/2013    Acute MI (HCC) 10/16/2013   OVERWEIGHT 12/10/2009   CIGARETTE SMOKER, has stopped smoking 12/10/2009   Essential hypertension 12/10/2009   DEGENERATIVE JOINT DISEASE 12/10/2009   HOARSENESS, CHRONIC 12/10/2009   CHEST PAIN UNSPECIFIED 12/10/2009   Hyperlipidemia, mixed 01/24/2008   Anxiety disorder, unspecified 01/24/2008   Allergic rhinitis 01/24/2008   COPD  GOLD II based on fev1/VC of 58%  01/24/2008   GERD 01/24/2008   POSITIVE PPD 01/24/2008    Orientation RESPIRATION BLADDER Height & Weight     Self, Time, Situation, Place  O2 Continent Weight: 176 lb 5.9 oz (80 kg) Height:  5' 1 (154.9 cm)  BEHAVIORAL SYMPTOMS/MOOD NEUROLOGICAL BOWEL NUTRITION STATUS      Continent Diet (see discharge summary)  AMBULATORY STATUS COMMUNICATION OF NEEDS Skin   Total Care Verbally Surgical wounds                       Personal Care Assistance Level of Assistance  Total care Bathing Assistance: Maximum assistance Feeding assistance: Limited assistance Dressing Assistance: Maximum assistance Total Care Assistance: Maximum assistance   Functional Limitations Info  Sight, Hearing, Speech Sight Info: Adequate Hearing Info: Adequate Speech Info: Adequate    SPECIAL CARE FACTORS FREQUENCY  PT (By licensed PT), OT (By licensed OT)     PT Frequency: 5x week OT Frequency: 5x week            Contractures Contractures Info: Not present    Additional Factors Info  Code Status, Allergies Code Status Info: full Allergies Info: NKA  Current Medications (03/22/2024):  This is the current hospital active medication list Current Facility-Administered Medications  Medication Dose Route Frequency Provider Last Rate Last Admin   acetaminophen  (TYLENOL ) tablet 1,000 mg  1,000 mg Oral Q6H PRN Segars, Jonathan, MD   1,000 mg at 03/22/24 1114   atorvastatin  (LIPITOR ) tablet 80 mg  80 mg Oral QHS Segars, Jonathan, MD   80 mg at 03/21/24 2132   ceFAZolin  (ANCEF ) IVPB 2g/100  mL premix  2 g Intravenous Q12H Moore, Michael A, MD 200 mL/hr at 03/22/24 1128 2 g at 03/22/24 1128   Chlorhexidine  Gluconate Cloth 2 % PADS 6 each  6 each Topical Daily Amin, Ankit C, MD   6 each at 03/22/24 1136   cyanocobalamin  (VITAMIN B12) tablet 1,000 mcg  1,000 mcg Oral Daily Segars, Jonathan, MD   1,000 mcg at 03/22/24 1050   escitalopram  (LEXAPRO ) tablet 20 mg  20 mg Oral Daily Segars, Jonathan, MD   20 mg at 03/22/24 1051   ezetimibe  (ZETIA ) tablet 10 mg  10 mg Oral Daily Segars, Jonathan, MD   10 mg at 03/22/24 1050   famotidine  (PEPCID ) tablet 20 mg  20 mg Oral Daily PRN Keturah Carrier, MD       feeding supplement (GLUCERNA SHAKE) (GLUCERNA SHAKE) liquid 237 mL  237 mL Oral TID BM Moore, Michael A, MD   237 mL at 03/22/24 1050   ferrous gluconate  (FERGON) tablet 324 mg  324 mg Oral BID WC Segars, Jonathan, MD   324 mg at 03/22/24 1051   fluticasone  furoate-vilanterol (BREO ELLIPTA ) 100-25 MCG/ACT 1 puff  1 puff Inhalation Daily Segars, Jonathan, MD   1 puff at 03/22/24 0848   glucagon  (human recombinant) (GLUCAGEN) injection 1 mg  1 mg Intravenous PRN Amin, Ankit C, MD       guaiFENesin  (ROBITUSSIN) 100 MG/5ML liquid 5 mL  5 mL Oral Q4H PRN Amin, Ankit C, MD       hydrALAZINE  (APRESOLINE ) injection 10 mg  10 mg Intravenous Q4H PRN Amin, Ankit C, MD       HYDROmorphone  (DILAUDID ) injection 0.5 mg  0.5 mg Intravenous Q4H PRN Segars, Carrier, MD       ipratropium-albuterol  (DUONEB) 0.5-2.5 (3) MG/3ML nebulizer solution 3 mL  3 mL Nebulization Q4H PRN Amin, Ankit C, MD       loratadine  (CLARITIN ) tablet 10 mg  10 mg Oral Daily PRN Segars, Carrier, MD       melatonin tablet 6 mg  6 mg Oral QHS PRN Segars, Carrier, MD       metoprolol  tartrate (LOPRESSOR ) injection 5 mg  5 mg Intravenous Q4H PRN Amin, Ankit C, MD       montelukast  (SINGULAIR ) tablet 10 mg  10 mg Oral QHS Segars, Jonathan, MD   10 mg at 03/21/24 2132   mupirocin  ointment (BACTROBAN ) 2 % 1 Application  1 Application Nasal  BID Amin, Ankit C, MD   1 Application at 03/22/24 1050   nebivolol  (BYSTOLIC ) tablet 5 mg  5 mg Oral Daily Segars, Carrier, MD   5 mg at 03/22/24 1051   ondansetron  (ZOFRAN ) injection 4 mg  4 mg Intravenous Once Lawsing, James, MD       ondansetron  (ZOFRAN ) injection 4 mg  4 mg Intravenous Q6H PRN Segars, Carrier, MD       oxyCODONE  (Oxy IR/ROXICODONE ) immediate release tablet 2.5 mg  2.5 mg Oral Q4H PRN Keturah Carrier, MD       Or   oxyCODONE  (Oxy IR/ROXICODONE )  immediate release tablet 5 mg  5 mg Oral Q4H PRN Segars, Jonathan, MD   5 mg at 03/21/24 0107   polyethylene glycol (MIRALAX  / GLYCOLAX ) packet 17 g  17 g Oral Daily PRN Segars, Jonathan, MD       rivaroxaban  (XARELTO ) tablet 10 mg  10 mg Oral Daily Moore, Michael A, MD   10 mg at 03/22/24 1051   senna-docusate (Senokot-S) tablet 1 tablet  1 tablet Oral QHS PRN Amin, Ankit C, MD       sodium chloride  flush (NS) 0.9 % injection 3 mL  3 mL Intravenous Q12H Segars, Dorn, MD   3 mL at 03/22/24 1135   sodium zirconium cyclosilicate  (LOKELMA ) packet 10 g  10 g Oral TID Amin, Ankit C, MD   10 g at 03/22/24 1050   spironolactone  (ALDACTONE ) tablet 12.5 mg  12.5 mg Oral Daily Segars, Dorn, MD   12.5 mg at 03/22/24 1051   Vitamin D  (Ergocalciferol ) (DRISDOL ) 1.25 MG (50000 UNIT) capsule 50,000 Units  50,000 Units Oral Q7 days Caleen Burgess BROCKS, MD         Discharge Medications: Please see discharge summary for a list of discharge medications.  Relevant Imaging Results:  Relevant Lab Results:   Additional Information SS# [id number]  Bridget Cordella Simmonds, LCSW

## 2024-03-22 NOTE — Consult Note (Signed)
 NEUROLOGY CONSULT NOTE   Date of service: March 22, 2024 Patient Name: Beverly Wheeler MRN:  [id number] DOB:  [birth year] Chief Complaint: code stroke, for dropping things from her hand Requesting Provider: Caleen Burgess BROCKS, MD  History of Present Illness  Beverly Wheeler is a 78 y.o. female with hx of CAD, asthma, COPD, Htn, HLD, MI who is admitted s/p fall and R tibia/fibula fx s/p ORIF.  A code stroke was activated with Lkw of 1730 when she was noted to be dropping objects from BL hands. However, LKW is unclear, has been noted to be weak all day.  Patient is pleasantly confused. Not on any opiods. Is on xarelto .  LKW: unclear Modified rankin score: 3-Moderate disability-requires help but walks WITHOUT assistance IV Thrombolysis: not offered, unclear LKW EVT: not offered, low suspicion for stroke.  NIHSS components Score: Comment  1a Level of Conscious 0[x]  1[]  2[]  3[]      1b LOC Questions 0[]  1[x]  2[]       1c LOC Commands 0[x]  1[]  2[]       2 Best Gaze 0[x]  1[]  2[]       3 Visual 0[x]  1[]  2[]  3[]      4 Facial Palsy 0[x]  1[]  2[]  3[]      5a Motor Arm - left 0[x]  1[]  2[]  3[]  4[]  UN[]    5b Motor Arm - Right 0[x]  1[]  2[]  3[]  4[]  UN[]    6a Motor Leg - Left 0[x]  1[]  2[]  3[]  4[]  UN[]    6b Motor Leg - Right 0[]  1[x]  2[]  3[]  4[]  UN[]  R tibia/fibula fx thou  7 Limb Ataxia 0[x]  1[]  2[]  UN[]      8 Sensory 0[x]  1[]  2[]  UN[]      9 Best Language 0[x]  1[]  2[]  3[]      10 Dysarthria 0[x]  1[]  2[]  UN[]      11 Extinct. and Inattention 0[x]  1[]  2[]       TOTAL: 2      ROS  Unable to ascertain due to confusion.  Past History   Past Medical History:  Diagnosis Date   Allergy    Asthma    CAD (coronary artery disease)    COPD (chronic obstructive pulmonary disease) (HCC)    Depression    Diabetes mellitus without complication (HCC)    GERD (gastroesophageal reflux disease)    Hyperlipidemia    Hypertension    Myocardial infarction (HCC) 10/16/2013    Past Surgical History:   Procedure Laterality Date   CARDIAC CATHETERIZATION  10/17/13   significant 3 vessel disease   LEFT HEART CATHETERIZATION WITH CORONARY ANGIOGRAM N/A 10/17/2013   Procedure: LEFT HEART CATHETERIZATION WITH CORONARY ANGIOGRAM;  Surgeon: Victory LELON Claudene DOUGLAS, MD;  Location: [facility] CATH LAB;  Service: Cardiovascular;  Laterality: N/A;    Family History: Family History  Problem Relation Age of Onset   Allergies Mother    Breast cancer Mother    Arthritis Mother    Cancer Mother    Hyperlipidemia Mother    Hypertension Mother    Heart disease Father    Heart disease Brother    Arthritis Brother    Cancer Brother    Depression Brother    Drug abuse Brother    Hyperlipidemia Brother    Heart attack Brother    Rheum arthritis Brother    Cancer Sister    Dementia Sister     Social History  reports that she quit smoking about 10 years ago. Her smoking use included cigarettes. She started smoking about 55 years ago. She has  a 45 pack-year smoking history. She has never used smokeless tobacco. She reports that she does not currently use alcohol. She reports that she does not use drugs.  No Known Allergies  Medications   Current Facility-Administered Medications:    acetaminophen  (TYLENOL ) tablet 1,000 mg, 1,000 mg, Oral, Q6H PRN, Segars, Jonathan, MD, 1,000 mg at 03/22/24 1114   atorvastatin  (LIPITOR ) tablet 80 mg, 80 mg, Oral, QHS, Segars, Dorn, MD, 80 mg at 03/21/24 2132   ceFAZolin  (ANCEF ) IVPB 2g/100 mL premix, 2 g, Intravenous, Q12H, Georgina Ozell LABOR, MD, Last Rate: 200 mL/hr at 03/22/24 1128, 2 g at 03/22/24 1128   Chlorhexidine  Gluconate Cloth 2 % PADS 6 each, 6 each, Topical, Daily, Amin, Ankit C, MD, 6 each at 03/22/24 1136   cyanocobalamin  (VITAMIN B12) tablet 1,000 mcg, 1,000 mcg, Oral, Daily, Segars, Dorn, MD, 1,000 mcg at 03/22/24 1050   escitalopram  (LEXAPRO ) tablet 20 mg, 20 mg, Oral, Daily, Segars, Dorn, MD, 20 mg at 03/22/24 1051   ezetimibe  (ZETIA ) tablet 10 mg,  10 mg, Oral, Daily, Segars, Dorn, MD, 10 mg at 03/22/24 1050   famotidine  (PEPCID ) tablet 20 mg, 20 mg, Oral, Daily PRN, Segars, Jonathan, MD   feeding supplement (GLUCERNA SHAKE) (GLUCERNA SHAKE) liquid 237 mL, 237 mL, Oral, TID BM, Georgina Ozell LABOR, MD, 237 mL at 03/22/24 1654   ferrous gluconate  (FERGON) tablet 324 mg, 324 mg, Oral, BID WC, Segars, Dorn, MD, 324 mg at 03/22/24 1654   fluticasone  furoate-vilanterol (BREO ELLIPTA ) 100-25 MCG/ACT 1 puff, 1 puff, Inhalation, Daily, Segars, Dorn, MD, 1 puff at 03/22/24 0848   glucagon  (human recombinant) (GLUCAGEN) injection 1 mg, 1 mg, Intravenous, PRN, Amin, Ankit C, MD   guaiFENesin  (ROBITUSSIN) 100 MG/5ML liquid 5 mL, 5 mL, Oral, Q4H PRN, Amin, Ankit C, MD   hydrALAZINE  (APRESOLINE ) injection 10 mg, 10 mg, Intravenous, Q4H PRN, Amin, Ankit C, MD   HYDROmorphone  (DILAUDID ) injection 0.5 mg, 0.5 mg, Intravenous, Q4H PRN, Segars, Dorn, MD   ipratropium-albuterol  (DUONEB) 0.5-2.5 (3) MG/3ML nebulizer solution 3 mL, 3 mL, Nebulization, Q4H PRN, Amin, Ankit C, MD   loratadine  (CLARITIN ) tablet 10 mg, 10 mg, Oral, Daily PRN, Segars, Jonathan, MD   melatonin tablet 6 mg, 6 mg, Oral, QHS PRN, Segars, Jonathan, MD   metoprolol  tartrate (LOPRESSOR ) injection 5 mg, 5 mg, Intravenous, Q4H PRN, Amin, Ankit C, MD   montelukast  (SINGULAIR ) tablet 10 mg, 10 mg, Oral, QHS, Segars, Dorn, MD, 10 mg at 03/21/24 2132   mupirocin  ointment (BACTROBAN ) 2 % 1 Application, 1 Application, Nasal, BID, Amin, Ankit C, MD, 1 Application at 03/22/24 1050   nebivolol  (BYSTOLIC ) tablet 5 mg, 5 mg, Oral, Daily, Segars, Dorn, MD, 5 mg at 03/22/24 1051   ondansetron  (ZOFRAN ) injection 4 mg, 4 mg, Intravenous, Once, Lawsing, James, MD   ondansetron  (ZOFRAN ) injection 4 mg, 4 mg, Intravenous, Q6H PRN, Segars, Jonathan, MD   oxyCODONE  (Oxy IR/ROXICODONE ) immediate release tablet 2.5 mg, 2.5 mg, Oral, Q4H PRN **OR** oxyCODONE  (Oxy IR/ROXICODONE ) immediate release  tablet 5 mg, 5 mg, Oral, Q4H PRN, Segars, Jonathan, MD, 5 mg at 03/21/24 0107   polyethylene glycol (MIRALAX  / GLYCOLAX ) packet 17 g, 17 g, Oral, Daily PRN, Segars, Jonathan, MD   rivaroxaban  (XARELTO ) tablet 10 mg, 10 mg, Oral, Daily, Moore, Michael A, MD, 10 mg at 03/22/24 1051   senna-docusate (Senokot-S) tablet 1 tablet, 1 tablet, Oral, QHS PRN, Amin, Ankit C, MD   sodium chloride  flush (NS) 0.9 % injection 3 mL, 3 mL, Intravenous, Q12H,  Keturah Carrier, MD, 3 mL at 03/22/24 1135   sodium zirconium cyclosilicate  (LOKELMA ) packet 10 g, 10 g, Oral, TID, Amin, Ankit C, MD, 10 g at 03/22/24 1654   spironolactone  (ALDACTONE ) tablet 12.5 mg, 12.5 mg, Oral, Daily, Segars, Carrier, MD, 12.5 mg at 03/22/24 1051   Vitamin D  (Ergocalciferol ) (DRISDOL ) 1.25 MG (50000 UNIT) capsule 50,000 Units, 50,000 Units, Oral, Q7 days, Amin, Ankit C, MD  Vitals   Vitals:   03/22/24 0857 03/22/24 1305 03/22/24 1556 03/22/24 1926  BP: (!) 126/49 (!) 126/100 (!) 116/55 125/78  Pulse:  63 60 65  Resp: 14  14   Temp: 98.8 F (37.1 C)  98.6 F (37 C) 98.4 F (36.9 C)  TempSrc:    Oral  SpO2: (!) 85% 93% 96% 96%  Weight:      Height:        Body mass index is 33.32 kg/m.   Physical Exam   General: Laying comfortably in bed; in no acute distress.  HENT: Normal oropharynx and mucosa. Normal external appearance of ears and nose.  Neck: Supple, no pain or tenderness  CV: No JVD. No peripheral edema.  Pulmonary: Symmetric Chest rise. Normal respiratory effort.  Abdomen: Soft to touch, non-tender.  Ext: No cyanosis, edema, or deformity  Skin: No rash. Normal palpation of skin.   Musculoskeletal: Normal digits and nails by inspection. No clubbing.   Neurologic Examination  Mental status/Cognition: Alert, oriented to self, place. Speech/language: missing her dentures but no definite slurring of speech noted. Fluent, comprehension intact, object naming intact. Cranial nerves:   CN II Pupils equal and  reactive to light, no VF deficits    CN III,IV,VI EOM intact, no gaze preference or deviation, no nystagmus    CN V normal sensation in V1, V2, and V3 segments bilaterally    CN VII no asymmetry, no nasolabial fold flattening    CN VIII normal hearing to speech    CN IX & X normal palatal elevation, no uvular deviation    CN XI 5/5 head turn and 5/5 shoulder shrug bilaterally    CN XII midline tongue protrusion    Motor:  Muscle bulk: normal, tone normal Mvmt Root Nerve  Muscle Right Left Comments  SA C5/6 Ax Deltoid     EF C5/6 Mc Biceps     EE C6/7/8 Rad Triceps     WF C6/7 Med FCR     WE C7/8 PIN ECU     F Ab C8/T1 U ADM/FDI 5 5   HF L1/2/3 Fem Illopsoas     KE L2/3/4 Fem Quad     DF L4/5 D Peron Tib Ant 4 5   PF S1/2 Tibial Grc/Sol 4 5    Sensation:  Light touch Intact throughout   Pin prick    Temperature    Vibration   Proprioception    Coordination/Complex Motor:  - Finger to Nose intact BL - Heel to shin unable to do due to R tibia/fibula fx. - Rapid alternating movement are normal BL - Gait: deferred for patient safety.  Labs/Imaging/Neurodiagnostic studies   CBC:  Recent Labs  Lab 03-23-24 1631 03/21/24 0452 03/21/24 1247 03/22/24 0503  WBC 13.1* 9.6  --  12.1*  NEUTROABS 10.5*  --   --   --   HGB 9.1* 8.4* 8.8* 8.2*  HCT 29.4* 27.4* 26.0* 26.6*  MCV 98.7 97.2  --  97.8  PLT 221 197  --  192   Basic Metabolic Panel:  Lab  Results  Component Value Date   NA 140 03/22/2024   K 5.8 (H) 03/22/2024   CO2 32 03/22/2024   GLUCOSE 124 (H) 03/22/2024   BUN 19 03/22/2024   CREATININE 1.63 (H) 03/22/2024   CALCIUM  8.8 (L) 03/22/2024   GFRNONAA 33 (L) 03/22/2024   GFRAA 60 04/16/2020   Lipid Panel:  Lab Results  Component Value Date   LDLCALC 134 (H) 03/07/2024   HgbA1c:  Lab Results  Component Value Date   HGBA1C 6.8 05/12/2023   Urine Drug Screen: No results found for: LABOPIA, COCAINSCRNUR, LABBENZ, AMPHETMU, THCU, LABBARB   Alcohol Level No results found for: [facility] INR  Lab Results  Component Value Date   INR 0.89 10/16/2013   APTT  Lab Results  Component Value Date   APTT 29 10/16/2013   AED levels: No results found for: PHENYTOIN, ZONISAMIDE, LAMOTRIGINE, LEVETIRACETA  CT Head without contrast(Personally reviewed): CTH was negative for a large hypodensity concerning for a large territory infarct or hyperdensity concerning for an ICH   ASSESSMENT   Beverly Wheeler is a 78 y.o. female admitted with GLF with R distal tibia/fibular fx who was activated as a code stroke for dropping objects from both hands. On exam, pleasantly confused elderly female, disoriented to month and year but no weakness.  Overall, low clinical suspicion for stroke and therefore not a candidate for tnkase or thrombectomy. CT Head was obtained and negative for acute hemorrhage. Suspet mild delirium.  RECOMMENDATIONS  - melatonin 3mg  at bedtime. - delirium precautions. - cancel code stroke - neurology will signoff. ______________________________________________________________________    Signed, Amron Guerrette, MD [facility]

## 2024-03-22 NOTE — Code Documentation (Signed)
 Responded to Code Stroke called at 1929 for weakness in B hands, LSN unclear as weakness has been progressive t/o day shift. CBG-114, NIH-5 for LOC questions and BLE weakness. CT head negative for acute changes. TNK not given-pt on xarelto , out of window, low suspicion for stroke. Code Stroke cancelled at 2007.   Pt somewhat sleepy t/o exam and skin hot to touch. Once back in room, temp taken. T-100.7 oral. Plan: tx fever with tylenol  and reassess, CBC/CMP/LA/U/A.

## 2024-03-22 NOTE — Evaluation (Signed)
 Occupational Therapy Evaluation Patient Details Name: Beverly Wheeler MRN: [id number] DOB: 06/19/1946 Today's Date: 03/22/2024   History of Present Illness   Beverly Wheeler is a 78 y.o. female admitted 03/20/24 after ground level fall sustaining a right tibial shaft and pilon fracture. Pt s/p ORIF and application of incisional vac 8/28. PMHx: COPD, chronic respiratory failure on 2-3L O2 at baseline, T2DM, HTN, HLD, CKD 3, CAD, and IDA.     Clinical Impressions PTA, pt lives with family, reports using a walker vs wheelchair but unclear of ADL/mobility assistance at baseline. Pt presents now with deficits in cognition, standing balance, strength, coordination and cardiopulmonary endurance. Pt requiring Max A x 2 for bed mobility, Mod A x 2 for brief stand before reporting feeling like she was about to pass out. BP 122/48 but SpO2 noted 79% on 2 L O2. Required increase in O2 to recover to low 90s before slowly titrating back to 2-3 L O2. Based on current presentation, recommend continued inpatient follow up therapy, <3 hours/day upon DC.     If plan is discharge home, recommend the following:   A lot of help with walking and/or transfers;Two people to help with walking and/or transfers;A lot of help with bathing/dressing/bathroom;Two people to help with bathing/dressing/bathroom;Assistance with feeding     Functional Status Assessment   Patient has had a recent decline in their functional status and demonstrates the ability to make significant improvements in function in a reasonable and predictable amount of time.     Equipment Recommendations   Other (comment) (TBD pending progress)     Recommendations for Other Services         Precautions/Restrictions   Precautions Precautions: Fall;Other (comment) Recall of Precautions/Restrictions: Impaired Precaution/Restrictions Comments: watch O2 (2-3 L O2 at baseline per chart) Restrictions Weight Bearing Restrictions  Per Provider Order: Yes RLE Weight Bearing Per Provider Order: Non weight bearing     Mobility Bed Mobility Overal bed mobility: Needs Assistance Bed Mobility: Supine to Sit, Sit to Supine     Supine to sit: +2 for physical assistance, +2 for safety/equipment, Max assist Sit to supine: Max assist, +2 for physical assistance, +2 for safety/equipment   General bed mobility comments: sequencing cues, assist for BLE and heavy assist to lift trunk as pt initially pushing back. Max A x 2 for BLE and truncal guidance back to bed    Transfers Overall transfer level: Needs assistance Equipment used: 2 person hand held assist Transfers: Sit to/from Stand Sit to Stand: Mod assist, +2 physical assistance, +2 safety/equipment           General transfer comment: Mod A x 2 to stand briefly at bedside holding to therapists' arms as pt UE dropping from RW.      Balance Overall balance assessment: Needs assistance Sitting-balance support: No upper extremity supported, Feet supported Sitting balance-Leahy Scale: Poor Sitting balance - Comments: UE support and CGA-MinA Postural control: Posterior lean Standing balance support: Bilateral upper extremity supported, During functional activity Standing balance-Leahy Scale: Poor                             ADL either performed or assessed with clinical judgement   ADL Overall ADL's : Needs assistance/impaired Eating/Feeding: Minimal assistance;Sitting   Grooming: Moderate assistance;Sitting   Upper Body Bathing: Maximal assistance;Sitting   Lower Body Bathing: Total assistance;Sitting/lateral leans;Bed level   Upper Body Dressing : Moderate assistance;Sitting   Lower Body Dressing: Total  assistance;Sitting/lateral leans;Bed level       Toileting- Clothing Manipulation and Hygiene: Total assistance;Bed level               Vision Ability to See in Adequate Light: 0 Adequate Patient Visual Report: No change from  baseline Vision Assessment?: No apparent visual deficits     Perception         Praxis         Pertinent Vitals/Pain Pain Assessment Pain Assessment: Faces Faces Pain Scale: Hurts little more Pain Location: RLE Pain Descriptors / Indicators: Grimacing Pain Intervention(s): Monitored during session, Repositioned     Extremity/Trunk Assessment Upper Extremity Assessment Upper Extremity Assessment: Defer to OT evaluation RUE Deficits / Details: noted with asterixis type movements, UE frequently dropping UE from RW or when reaching for tasks RUE Coordination: decreased fine motor;decreased gross motor LUE Deficits / Details: noted with asterixis type movements, UE frequently dropping UE from RW or when reaching for tasks LUE Coordination: decreased fine motor;decreased gross motor   Lower Extremity Assessment Lower Extremity Assessment: Generalized weakness;RLE deficits/detail   Cervical / Trunk Assessment Cervical / Trunk Assessment: Normal   Communication Communication Communication: Impaired Factors Affecting Communication: Reduced clarity of speech;Difficulty expressing self   Cognition Arousal: Alert Behavior During Therapy: Flat affect Cognition: Difficult to assess Difficult to assess due to: Impaired communication           OT - Cognition Comments: pt with flat affect, dsyarthric speech. able to follow commands with multimodal cues, confusion noted, memory deficits and decreased overall awareness                 Following commands: Impaired Following commands impaired: Only follows one step commands consistently, Follows one step commands with increased time, Follows multi-step commands inconsistently     Cueing  General Comments   Cueing Techniques: Verbal cues;Gestural cues;Tactile cues  Pt on 2 L O2 on entry. Pt reported feeling as if she was going to pass out sitting EOB. Assessed BP lying with 122/48. SpO2 79% with good pleth. required increase  to 8 L O2 to recover to 92% with gradual titration back to 2-3 L O2. After falling asleep, SpO2 86%. RN notified   Exercises     Shoulder Instructions      Home Living Family/patient expects to be discharged to:: Private residence Living Arrangements: Children Available Help at Discharge: Family;Available 24 hours/day Type of Home: House Home Access: Stairs to enter [employer] of Steps: 3 Entrance Stairs-Rails: Right Home Layout: Multi-level;Able to live on main level with bedroom/bathroom Alternate Level Stairs-Number of Steps: 4-6 Alternate Level Stairs-Rails: Left Bathroom Shower/Tub: Chief Strategy Officer: Standard Bathroom Accessibility: Yes How Accessible: Accessible via walker Home Equipment: Rollator (4 wheels);Wheelchair - Sport and exercise psychologist Comments: Above information obtained per chart review - pt reports living with brother and being in apartment today (?)      Prior Functioning/Environment Prior Level of Function : Needs assist;Patient poor historian/Family not available             Mobility Comments: Per prior admission, pt furniture walks or uses Rollator. She stated she uses w/c today (?) ADLs Comments: Per prior admission, pt doesn't drive. Son assists with iADL, meds and meals and community mobility. Patient takes baths, and needs occasional assist with lower body ADL and to step into tub. She states she needs help today    OT Problem List: Decreased strength;Decreased activity tolerance;Impaired balance (sitting and/or standing);Pain;Decreased cognition;Decreased safety  awareness;Decreased knowledge of precautions;Decreased knowledge of use of DME or AE   OT Treatment/Interventions: Self-care/ADL training;Therapeutic exercise;DME and/or AE instruction;Energy conservation;Therapeutic activities;Patient/family education;Balance training      OT Goals(Current goals can be found in the care plan section)   Acute Rehab  OT Goals Patient Stated Goal: none stated OT Goal Formulation: Patient unable to participate in goal setting Time For Goal Achievement: 04/05/24 Potential to Achieve Goals: Fair   OT Frequency:  Min 1X/week    Co-evaluation PT/OT/SLP Co-Evaluation/Treatment: Yes Reason for Co-Treatment: For patient/therapist safety;To address functional/ADL transfers PT goals addressed during session: Mobility/safety with mobility;Balance OT goals addressed during session: ADL's and self-care;Proper use of Adaptive equipment and DME      AM-PAC OT 6 Clicks Daily Activity     Outcome Measure Help from another person eating meals?: A Little Help from another person taking care of personal grooming?: A Lot Help from another person toileting, which includes using toliet, bedpan, or urinal?: Total Help from another person bathing (including washing, rinsing, drying)?: A Lot Help from another person to put on and taking off regular upper body clothing?: A Lot Help from another person to put on and taking off regular lower body clothing?: A Lot 6 Click Score: 12   End of Session Equipment Utilized During Treatment: Oxygen ;Gait belt Nurse Communication: Mobility status  Activity Tolerance: Treatment limited secondary to medical complications (Comment) Patient left: in bed;with call bell/phone within reach;with bed alarm set  OT Visit Diagnosis: Unsteadiness on feet (R26.81);Other abnormalities of gait and mobility (R26.89);Muscle weakness (generalized) (M62.81)                Time: 8861-8796 OT Time Calculation (min): 25 min Charges:  OT General Charges $OT Visit: 1 Visit OT Evaluation $OT Eval Moderate Complexity: 1 Mod  Mliss NOVAK, OTR/L Acute Rehab Services Office: [phone]   Mliss Fish 03/22/2024, 1:26 PM

## 2024-03-22 NOTE — Discharge Instructions (Addendum)
 Orthopedic Surgery Discharge Instructions  Patient name: Beverly Wheeler Fracture: right pilon fracture Procedure Performed: right pilon fracture open reduction internal fixation Date of Surgery: 03/21/2024 Surgeon: Ozell Ada, MD  Activity: You should not put any weight on the right lower extremity.  The plates and screws that were used to fix your fracture are not designed for weightbearing.  He will be nonweightbearing for a total of 3 months after surgery.  Incision Care: Your incision has an incisional VAC over it.  This is used to promote wound healing.  The home [facility] unit is rechargeable.  When possible, recharge it so that it does not run out of battery power.  You should leave this on until you are seen in the office.  If it does stop working, switch to a dry dressing that should be placed over the wound.  The wound should be kept dry at all times.  Medications: You have been prescribed oxycodone . This is a narcotic pain medication and should only be taken as prescribed. You should not drink alcohol or operate heavy machinery (including driving) while taking this medication. The oxycodone  can cause constipation as a side effect. For that reason, you have been prescribed senna and miralax . These are both laxatives. You do not need to take this medication if you develop diarrhea. Should you remain constipated even while taking the senna and miralax , please use the miralax  twice daily. Tylenol  has been prescribed to be taken every 8 hours, which will give you additional pain relief.   You have been prescribed Xarelto  as a blood thinner. This medication is to be taken to prevent blood clots. Take 10 milligrams daily. You should refrain from using other blood thinners (warfarin, apixaban, plavix, xarelto , etc.) while using the aspirin . You will need to take this medication for a total of 6 weeks after your surgery.   You should not use over-the-counter NSAIDs (ibuprofen, Aleve, Celebrex,  naproxen, meloxicam, etc.) for pain relief as there is evidence that they have decreased fracture healing potential.  In order to set expectations for opioid prescriptions, you will only be prescribed opioids for a total of six weeks after surgery and, at two-weeks after surgery, your opioid prescription will start to tapered (decreased dosage and number of pills). If you have ongoing need for opioid medication six weeks after surgery, you will be referred to pain management. If you are already established with a provider that is giving you opioid medications, you should schedule an appointment with them for six weeks after surgery if you feel you are going to need another prescription. State law only allows for opioid prescriptions one week at a time. If you are running out of opioid medication near the end of the week, please call the office during business hours before running out so I can send you another prescription.   Diet: You are safe to resume your regular diet after surgery.   Reasons to Call the Office After Surgery: You should feel free to call the office with any concerns or questions you have in the post-operative period, but you should definitely notify the office if you develop: -shortness of breath, chest pain, or trouble breathing -excessive bleeding, drainage, redness, or swelling around the surgical site -fevers, chills, or pain that is getting worse with each passing day -persistent nausea or vomiting -new weakness in the right leg, new or worsening numbness or tingling in right leg -other concerns about your surgery  Follow Up Appointments: You have follow up scheduled  with Dr. Georgina at 9:30am on 04/08/2024. The office location and phone number are listed below. Please arrive on time to your appointment.    Office Information:  -Ozell Georgina, MD -Phone number: [phone] -Address: [address]       [place], [place] [address]

## 2024-03-22 NOTE — TOC Initial Note (Signed)
 Transition of Care [facility]) - Initial/Assessment Note    Patient Details  Name: Beverly Wheeler MRN: [id number] Date of Birth: 03-20-1946  [facility]) CM/SW Contact:    Bridget Cordella Simmonds, LCSW Phone Number: 03/22/2024, 2:35 PM  Clinical Narrative:     CSW spoke with pt and so Zack regarding PT recommendation for SNF.  They are agreeable to SNF, medicare choice document provided, permission given to send out referral in the hub.  Pt from home with Zack, no current services.  Referral sent out in hub for SNF.              Expected Discharge Plan: Skilled Nursing Facility Barriers to Discharge: Continued Medical Work up, SNF Pending bed offer   Patient Goals and CMS Choice Patient states their goals for this hospitalization and ongoing recovery are:: going out, walking CMS Medicare.gov Compare Post Acute Care list provided to:: Patient Choice offered to / list presented to : Patient      Expected Discharge Plan and Services In-house Referral: Clinical Social Work   Post Acute Care Choice: Skilled Nursing Facility Living arrangements for the past 2 months: Single Family Home                                      Prior Living Arrangements/Services Living arrangements for the past 2 months: Single Family Home Lives with:: Adult Children (lives with son Christyne) Patient language and need for interpreter reviewed:: Yes Do you feel safe going back to the place where you live?: Yes      Need for Family Participation in Patient Care: Yes (Comment) Care giver support system in place?: Yes (comment) Current home services: Other (comment) (none) Criminal Activity/Legal Involvement Pertinent to Current Situation/Hospitalization: No - Comment as needed  Activities of Daily Living      Permission Sought/Granted Permission sought to share information with : Family Supports Permission granted to share information with : Yes, Verbal Permission Granted  Share  Information with NAME: son Christyne, cousins Deane and Asberry  Permission granted to share info w AGENCY: SNF        Emotional Assessment Appearance:: Appears stated age Attitude/Demeanor/Rapport: Engaged Affect (typically observed): Appropriate, Pleasant Orientation: : Oriented to Self, Oriented to Place, Oriented to  Time, Oriented to Situation      Admission diagnosis:  Closed right ankle fracture [S82.891A] Fall, initial encounter [W19.XXXA] Closed fracture of distal end of right fibula and tibia, initial encounter [D17.168J, S82.301A] Patient Active Problem List   Diagnosis Date Noted   Closed fracture of distal end of right fibula and tibia 03/21/2024   Displaced pilon fracture of right tibia, initial encounter for closed fracture 03/21/2024   Closed right ankle fracture 03/20/2024   Acute encephalopathy 03/20/2024   Goals of care, counseling/discussion 03/20/2024   Leukocytosis 03/20/2024   HFrEF (heart failure with reduced ejection fraction) (HCC) 05/12/2023   Ischemic cardiomyopathy 04/12/2023   COPD exacerbation (HCC) 04/08/2023   Pyuria 01/14/2022   Chronic respiratory failure with hypoxia and hypercapnia (HCC) 01/14/2022   Hyperammonemia (HCC) 01/14/2022   Diabetes mellitus (HCC) 05/19/2020   Venous stasis dermatitis of both lower extremities 01/08/2018   Iron  deficiency anemia 01/08/2018   Acute respiratory failure with hypoxia (HCC)    Hypokalemia    Pressure injury of skin 11/24/2017   Acute kidney injury (HCC)    Cellulitis of left lower extremity    Sepsis (  HCC)    Septic shock (HCC)    Hypovolemic shock (HCC)    Hypotension 11/23/2017   CKD (chronic kidney disease), stage III 11/22/2017   CAD, multiple vessel 10/30/2013   Pre-op evaluation 10/17/2013   Myocardial infarction (HCC) 10/16/2013   Acute MI (HCC) 10/16/2013   OVERWEIGHT 12/10/2009   CIGARETTE SMOKER, has stopped smoking 12/10/2009   Essential hypertension 12/10/2009   DEGENERATIVE JOINT  DISEASE 12/10/2009   HOARSENESS, CHRONIC 12/10/2009   CHEST PAIN UNSPECIFIED 12/10/2009   Hyperlipidemia, mixed 01/24/2008   Anxiety disorder, unspecified 01/24/2008   Allergic rhinitis 01/24/2008   COPD  GOLD II based on fev1/VC of 58%  01/24/2008   GERD 01/24/2008   POSITIVE PPD 01/24/2008   PCP:  [place], Betty G, MD Pharmacy:   [phone] - [place], [place] - [address] AT CORNER OF [facility] [address] [place] [address] Phone: [phone] Fax: [phone]  [facility] [address] [place] [address] Phone: [phone] Fax: [phone]  [facility] - [facility] [address] [place] [address] Phone: [phone] Fax: [phone]     Social Drivers of Health (SDOH) Social History: SDOH Screenings   Food Insecurity: No Food Insecurity (04/09/2023)  Housing: Patient Declined (04/12/2023)  Transportation Needs: No Transportation Needs (04/12/2023)  Utilities: At Risk (04/09/2023)  Alcohol Screen: Low Risk  (04/12/2023)  Depression (PHQ2-9): Low Risk  (09/26/2023)  Financial Resource Strain: Low Risk  (04/12/2023)  Physical Activity: Inactive (05/02/2022)  Social Connections: Socially Isolated (04/29/2021)  Stress: No Stress Concern Present (05/02/2022)  Tobacco Use: Medium Risk (03/21/2024)   SDOH Interventions: Utilities Interventions: Intervention Not Indicated, Inpatient TOC   Readmission Risk Interventions     No data to display

## 2024-03-22 NOTE — Evaluation (Signed)
 Physical Therapy Evaluation Patient Details Name: Beverly Wheeler MRN: [id number] DOB: June 16, 1946 Today's Date: 03/22/2024  History of Present Illness  Beverly Wheeler is a 78 y.o. female admitted 03/20/24 after ground level fall sustaining a right tibial shaft and pilon fracture. Pt s/p ORIF and application of incisional vac 8/28. PMHx: COPD, chronic respiratory failure on 2-3L O2 at baseline, T2DM, HTN, HLD, CKD 3, CAD, and IDA.   Clinical Impression  Pt admitted with above diagnosis. Examination limited by impaired cognition, pt being a poor historian, and lack of family present. Per chart review, pt requires assist with functional mobility and ADLs/IADLs. She mobilizes using a rollator vs. wheelchair vs. furniture for support. Pt currently with functional limitations due to the deficits listed below (see PT Problem List). She required maxA x2 for bed mobility and modA x2 for sit<>stand with 2 HHA. Educated pt on weight bearing status. She had difficulty maintaining RLE NWB during static stance. Pt stood for ~30seconds before reporting she felt she was about to pass out. Assessed vitals with BP 122/48 and SpO2 79% on 2L O2. Pt required increased to 8L in order to recover to 90s SpO2 with cues for PLB. She was able to titrate back to her baseline 2-3L with SpO2 88-89%. Pt will benefit from acute skilled PT to increase her independence and safety with mobility to allow discharge. Recommend continued inpatient follow up therapy, <3 hours/day.    If plan is discharge home, recommend the following: Two people to help with walking and/or transfers;Two people to help with bathing/dressing/bathroom;Assistance with cooking/housework;Assist for transportation;Help with stairs or ramp for entrance;Supervision due to cognitive status   Can travel by private vehicle   No    Equipment Recommendations Hospital bed;Hoyer lift;Rolling walker (2 wheels)  Recommendations for Other Services        Functional Status Assessment Patient has had a recent decline in their functional status and demonstrates the ability to make significant improvements in function in a reasonable and predictable amount of time.     Precautions / Restrictions Precautions Precautions: Fall Recall of Precautions/Restrictions: Impaired Precaution/Restrictions Comments: watch SpO2; Wound Vac. Pt had difficulty following weight bearing precautions. Restrictions Weight Bearing Restrictions Per Provider Order: Yes RLE Weight Bearing Per Provider Order: Non weight bearing      Mobility  Bed Mobility Overal bed mobility: Needs Assistance Bed Mobility: Supine to Sit, Sit to Supine     Supine to sit: +2 for physical assistance, +2 for safety/equipment, Max assist Sit to supine: Max assist, +2 for physical assistance, +2 for safety/equipment   General bed mobility comments: Sequencing cues, assist for BLE and heavy assist to lift trunk as pt initially pushing back. MaxA x 2 for BLE and truncal guidance back to bed.    Transfers Overall transfer level: Needs assistance Equipment used: 2 person hand held assist Transfers: Sit to/from Stand Sit to Stand: Mod assist, +2 physical assistance, +2 safety/equipment           General transfer comment: Mod A x 2 to stand briefly at bedside holding to therapists' arms as pt BUE dropping from RW.    Ambulation/Gait               General Gait Details: Unable  Stairs            Wheelchair Mobility     Tilt Bed    Modified Rankin (Stroke Patients Only)       Balance Overall balance assessment: Needs assistance Sitting-balance support: No  upper extremity supported, Feet supported Sitting balance-Leahy Scale: Poor Sitting balance - Comments: UE support and CGA-MinA Postural control: Posterior lean Standing balance support: Bilateral upper extremity supported, During functional activity Standing balance-Leahy Scale: Poor Standing balance  comment: Pt dependent on RW and +2 assist                             Pertinent Vitals/Pain Pain Assessment Pain Assessment: Faces Faces Pain Scale: Hurts little more Pain Location: RLE Pain Descriptors / Indicators: Grimacing Pain Intervention(s): Monitored during session, Repositioned    Home Living Family/patient expects to be discharged to:: Private residence Living Arrangements: Children Available Help at Discharge: Family;Available 24 hours/day Type of Home: House Home Access: Stairs to enter Entrance Stairs-Rails: Right Entrance Stairs-Number of Steps: 3 Alternate Level Stairs-Number of Steps: 4-6 Home Layout: Multi-level;Able to live on main level with bedroom/bathroom Home Equipment: Rollator (4 wheels);Wheelchair - Lawyer Comments: Above information obtained per chart review - pt reports living with brother and being in apartment today (?)    Prior Function Prior Level of Function : Needs assist;Patient poor historian/Family not available             Mobility Comments: Per prior admission, pt furniture walks or uses Rollator. She stated she uses w/c today (?) ADLs Comments: Per prior admission, pt doesn't drive. Son assists with iADL, meds and meals and community mobility. Patient takes baths, and needs occasional assist with lower body ADL and to step into tub. She states she needs help today     Extremity/Trunk Assessment   Upper Extremity Assessment Upper Extremity Assessment: Defer to OT evaluation RUE Deficits / Details: noted with asterixis type movements, UE frequently dropping UE from RW or when reaching for tasks RUE Coordination: decreased fine motor;decreased gross motor LUE Deficits / Details: noted with asterixis type movements, UE frequently dropping UE from RW or when reaching for tasks LUE Coordination: decreased fine motor;decreased gross motor    Lower Extremity Assessment Lower Extremity Assessment:  Generalized weakness;RLE deficits/detail RLE Deficits / Details: Pt s/p ORIF with NPWT applied to anterior tibia. Limited AROM. RLE: Unable to fully assess due to pain RLE Coordination: decreased gross motor    Cervical / Trunk Assessment Cervical / Trunk Assessment: Normal  Communication   Communication Communication: Impaired Factors Affecting Communication: Reduced clarity of speech;Difficulty expressing self    Cognition Arousal: Alert Behavior During Therapy: WFL for tasks assessed/performed   PT - Cognitive impairments: No family/caregiver present to determine baseline, Orientation, Awareness, Attention, Initiation, Sequencing, Problem solving, Safety/Judgement   Orientation impairments: Place, Time, Situation                   PT - Cognition Comments: Pt with mild encephalopathy. Pt A,O to self only. Following commands: Impaired Following commands impaired: Only follows one step commands consistently, Follows one step commands with increased time, Follows multi-step commands inconsistently     Cueing Cueing Techniques: Verbal cues, Gestural cues, Tactile cues     General Comments General comments (skin integrity, edema, etc.): Pt on 2 L O2 via [place] upon entry. Pt reported feeling as if she was going to pass out sitting EOB. Assessed BP lying with 122/48. SpO2 79% with good pleth. Required bump up to 8 L O2 to recover to 92% with gradual titration back to 2-3 L O2. After falling asleep, SpO2 86%. RN notified    Exercises     Assessment/Plan  PT Assessment Patient needs continued PT services  PT Problem List Decreased strength;Decreased range of motion;Decreased activity tolerance;Cardiopulmonary status limiting activity;Decreased balance;Decreased mobility;Decreased knowledge of use of DME;Decreased safety awareness;Decreased knowledge of precautions       PT Treatment Interventions DME instruction;Gait training;Functional mobility training;Therapeutic  activities;Balance training;Therapeutic exercise;Patient/family education;Wheelchair mobility training    PT Goals (Current goals can be found in the Care Plan section)  Acute Rehab PT Goals PT Goal Formulation: Patient unable to participate in goal setting Time For Goal Achievement: 04/05/24 Potential to Achieve Goals: Fair    Frequency Min 2X/week     Co-evaluation PT/OT/SLP Co-Evaluation/Treatment: Yes Reason for Co-Treatment: For patient/therapist safety;To address functional/ADL transfers PT goals addressed during session: Mobility/safety with mobility;Balance OT goals addressed during session: ADL's and self-care;Proper use of Adaptive equipment and DME       AM-PAC PT 6 Clicks Mobility  Outcome Measure Help needed turning from your back to your side while in a flat bed without using bedrails?: Total Help needed moving from lying on your back to sitting on the side of a flat bed without using bedrails?: Total Help needed moving to and from a bed to a chair (including a wheelchair)?: Total Help needed standing up from a chair using your arms (e.g., wheelchair or bedside chair)?: Total Help needed to walk in hospital room?: Total Help needed climbing 3-5 steps with a railing? : Total 6 Click Score: 6    End of Session Equipment Utilized During Treatment: Oxygen ;Gait belt Activity Tolerance: Patient limited by pain;Treatment limited secondary to medical complications (Comment) (onset of dizziness and desaturation) Patient left: in bed;with call bell/phone within reach;with bed alarm set Nurse Communication: Mobility status;Other (comment) (VS response) PT Visit Diagnosis: Muscle weakness (generalized) (M62.81);Other abnormalities of gait and mobility (R26.89);Unsteadiness on feet (R26.81);Difficulty in walking, not elsewhere classified (R26.2)    Time: 8860-8796 PT Time Calculation (min) (ACUTE ONLY): 24 min   Charges:   PT Evaluation $PT Eval Moderate Complexity: 1  Mod   PT General Charges $$ ACUTE PT VISIT: 1 Visit         Randall SAUNDERS, PT, DPT Acute Rehabilitation Services Office: [phone] Secure Chat Preferred  Delon CHRISTELLA Callander 03/22/2024, 1:37 PM

## 2024-03-22 NOTE — Progress Notes (Signed)
 Orthopedic Surgery Progress Note   Assessment: Patient is a 78 y.o. female with right pilon and tibia fractures status post ORIF   Plan: -Operative plans: complete -Diet: diabetic -DVT ppx: xarelto  -Antibiotics: ancef  x2 post-op doses -Weight bearing status: NWB RLE -Keep wound vac in place, transition to home unit at discharge (home unit left charging on counter top). Do not need to replace the purple foam or the track pad, just hook up to the portable vac -PT evaluate and treat -Pain control -Dispo: per primary  ___________________________________________________________________________  Subjective: No acute events overnight. Pain well controlled.    Physical Exam:  General: no acute distress, appears stated age Neurologic: alert, answering questions appropriately, following commands Respiratory: unlabored breathing on room air, symmetric chest rise Psychiatric: appropriate affect, normal cadence to speech  MSK:   -Right lower extremity  Vac in place with good suction and no drainage in canister  EHL/TA/GSC intact Plantarflexes and dorsiflexes toes Sensation intact to light touch in sural, saphenous, tibial, deep peroneal, and superficial peroneal nerve distributions Foot warm and well perfused   Yesterday's total administered Morphine Milligram Equivalents: 15   Patient name: Beverly Wheeler Patient MRN: [id number] Date: 03/22/24

## 2024-03-23 DIAGNOSIS — S82891G Other fracture of right lower leg, subsequent encounter for closed fracture with delayed healing: Secondary | ICD-10-CM

## 2024-03-23 LAB — CBC
HCT: 23.6 % — ABNORMAL LOW (ref 36.0–46.0)
Hemoglobin: 7.3 g/dL — ABNORMAL LOW (ref 12.0–15.0)
MCH: 30 pg (ref 26.0–34.0)
MCHC: 30.9 g/dL (ref 30.0–36.0)
MCV: 97.1 fL (ref 80.0–100.0)
Platelets: 185 K/uL (ref 150–400)
RBC: 2.43 MIL/uL — ABNORMAL LOW (ref 3.87–5.11)
RDW: 13.2 % (ref 11.5–15.5)
WBC: 9 K/uL (ref 4.0–10.5)
nRBC: 0.2 % (ref 0.0–0.2)

## 2024-03-23 LAB — MAGNESIUM: Magnesium: 1.8 mg/dL (ref 1.7–2.4)

## 2024-03-23 LAB — BASIC METABOLIC PANEL WITH GFR
Anion gap: 9 (ref 5–15)
BUN: 22 mg/dL (ref 8–23)
CO2: 35 mmol/L — ABNORMAL HIGH (ref 22–32)
Calcium: 8.4 mg/dL — ABNORMAL LOW (ref 8.9–10.3)
Chloride: 95 mmol/L — ABNORMAL LOW (ref 98–111)
Creatinine, Ser: 1.63 mg/dL — ABNORMAL HIGH (ref 0.44–1.00)
GFR, Estimated: 33 mL/min — ABNORMAL LOW (ref >60)
Glucose, Bld: 105 mg/dL — ABNORMAL HIGH (ref 70–99)
Potassium: 4.6 mmol/L (ref 3.5–5.1)
Sodium: 139 mmol/L (ref 135–145)

## 2024-03-23 LAB — LACTIC ACID, PLASMA: Lactic Acid, Venous: 0.6 mmol/L (ref 0.5–1.9)

## 2024-03-23 LAB — HEMOGLOBIN A1C
Hgb A1c MFr Bld: 6.2 % — ABNORMAL HIGH (ref 4.8–5.6)
Mean Plasma Glucose: 131.24 mg/dL

## 2024-03-23 LAB — GLUCOSE, CAPILLARY
Glucose-Capillary: 111 mg/dL — ABNORMAL HIGH (ref 70–99)
Glucose-Capillary: 112 mg/dL — ABNORMAL HIGH (ref 70–99)
Glucose-Capillary: 103 mg/dL — ABNORMAL HIGH (ref 70–99)
Glucose-Capillary: 85 mg/dL (ref 70–99)

## 2024-03-23 LAB — AMMONIA: Ammonia: 29 umol/L (ref 9–35)

## 2024-03-23 MED ORDER — INSULIN ASPART 100 UNIT/ML IJ SOLN: 0.0000 [IU] | Freq: Three times a day (TID) | INTRAMUSCULAR | Status: DC

## 2024-03-23 NOTE — Progress Notes (Signed)
 PROGRESS NOTE    Beverly Wheeler  FMW:[id number] DOB: 1945/11/01 DOA: 03/20/2024 PCP: [place], Betty G, MD    Brief Narrative:  78 year old with history of chronic hypoxic respiratory failure 2 L nasal cannula, COPD, CAD, CHF with reduced EF, CKD 3A, HTN, HLD, DM 2, iron  deficiency anemia presented with ground-level fall causing right distal tibial/fibular fracture.  Orthopedic consulted.  Patient underwent ORIF on 8/28, postop management per their service.  Postop course complicated by concerns of weakness and confusion therefore CT head performed which was unremarkable and was also seen by neurology service.  PT/OT is recommending SNF.   Assessment & Plan:  Principal Problem:   Closed right ankle fracture Active Problems:   Acute encephalopathy   Goals of care, counseling/discussion   Leukocytosis   Closed fracture of distal end of right fibula and tibia   Displaced pilon fracture of right tibia, initial encounter for closed fracture     Ground level fall  C/b closed R distal tibia / fibular fracture  S/p ORIF by ortho on 8/28. Post Op management per their service. Cont wound Vac. Post Op DVT ppx Xarelto .  - Pain control, bowel regimen  Progressive weakness and confusion - Overnight concerns of possible CVA?.  CT head is unremarkable.  Seen by neurology team.   Leukocytosis  Reactive.       Goals of care  She was uncertain re: code status, would like to be full code for now while thinking about this more. Has declined definitive management of her CAD in the past. With her advanced comorbidities recovery during resuscitation is expected to be poor which I discussed with patient  -- For now remains as full code. Continue goals of care discussions.     COPD  Without acute exacerbation, sub home dulera  (not taking PTA), montelukast . Nebs prn   CHRF on 2L O2  continue home O2.   CAD, hx of prior MI  Hx of multivessel CAD and referred for CABG in '15 but declined. Hold  aspirin  periop resume as soon as able. Continue home statin, zetia    HFrEF  Last TTE 9/'24 with LVEF 25-30%, global hypokinesis with local WM abnormalities, Gr 2 DD, biatrial dilation, moderate MR, mild AS. Temporarily pause lasix  (takes three times per week) and Farxiga  preop. Hold Losartan  preop, resume after. Continue home Spironolactone , Bystolic .  Follow-up outpatient [facility] cardiology. Due for outptn follow up and Repeat Echo.   CKD stage IIIa HyperKalemia  Baseline Cr near 1.2 - 1.4. On admission is 1.5, continue to monitor.  Lokelma   HTN  See GDMT meds above   HLD  See CAD above   DM type 2 -Sliding scale and Accu-Cheks  IDA  Hb near baseline ~ 9. Borderline macrocytic. Repeat iron  panel, B12, Folate   Mood d/o  Continue home lexapro    Vitamin D  deficiency - Weekly supplements added   DVT prophylaxis: Xarelto  rivaroxaban  (XARELTO ) tablet 10 mg    Code Status: Full Code Family Communication:   Status is: Inpatient Remains inpatient appropriate because: Pending SNF placement   PT Follow up Recs: Skilled Nursing-Short Term Rehab (<3 Hours/Day)03/22/2024 1300  Subjective: Feeling okay this morning, speech is fairly clear to me.  No other complaints.  Examination:  General exam: Appears calm and comfortable  Respiratory system: Clear to auscultation. Respiratory effort normal. Cardiovascular system: S1 & S2 heard, RRR. No JVD, murmurs, rubs, gallops or clicks. No pedal edema. Gastrointestinal system: Abdomen is nondistended, soft and nontender. No organomegaly or masses felt.  Normal bowel sounds heard. Central nervous system: Alert and oriented.  Minimal slurred speech which is basically her baseline Extremities: Symmetric 5 x 5 power. Skin: RLE dressing in place.  Psychiatry: Judgement and insight appear normal. Mood & affect appropriate.                Diet Orders (From admission, onward)     Start     Ordered   03/21/24 1854  Diet Carb Modified  Fluid consistency: Thin  Diet effective now       Question Answer Comment  Calorie Level Medium 1600-2000   Fluid consistency: Thin      03/21/24 1853            Objective: Vitals:   03/23/24 0424 03/23/24 0809 03/23/24 0907 03/23/24 0941  BP: (!) 116/48 (!) 124/53  (!) 127/59  Pulse: (!) 51 (!) 59    Resp: 15 18    Temp: 98.7 F (37.1 C) 98.5 F (36.9 C)    TempSrc: Oral Oral    SpO2: 99% 97% 95%   Weight:      Height:        Intake/Output Summary (Last 24 hours) at 03/23/2024 1005 Last data filed at 03/23/2024 0940 Gross per 24 hour  Intake 103 ml  Output 300 ml  Net -197 ml   Filed Weights   03/20/24 1531 03/21/24 1220 03/21/24 1245  Weight: 80.3 kg (P) 80.3 kg 80 kg    Scheduled Meds:  atorvastatin   80 mg Oral QHS   Chlorhexidine  Gluconate Cloth  6 each Topical Daily   cyanocobalamin   1,000 mcg Oral Daily   escitalopram   20 mg Oral Daily   ezetimibe   10 mg Oral Daily   feeding supplement (GLUCERNA SHAKE)  237 mL Oral TID BM   ferrous gluconate   324 mg Oral BID WC   fluticasone  furoate-vilanterol  1 puff Inhalation Daily   insulin  aspart  0-6 Units Subcutaneous TID WC   melatonin  6 mg Oral QHS   montelukast   10 mg Oral QHS   mupirocin  ointment  1 Application Nasal BID   nebivolol   5 mg Oral Daily   ondansetron  (ZOFRAN ) IV  4 mg Intravenous Once   rivaroxaban   10 mg Oral Daily   sodium chloride  flush  3 mL Intravenous Q12H   spironolactone   12.5 mg Oral Daily   Vitamin D  (Ergocalciferol )  50,000 Units Oral Q7 days   Continuous Infusions:  Nutritional status     Body mass index is 33.32 kg/m.  Data Reviewed:   CBC: Recent Labs  Lab 03/20/24 1631 03/21/24 0452 03/21/24 1247 03/22/24 0503 03/22/24 2137 03/23/24 0649  WBC 13.1* 9.6  --  12.1* 9.6 9.0  NEUTROABS 10.5*  --   --   --  7.4  --   HGB 9.1* 8.4* 8.8* 8.2* 7.5* 7.3*  HCT 29.4* 27.4* 26.0* 26.6* 23.6* 23.6*  MCV 98.7 97.2  --  97.8 95.9 97.1  PLT 221 197  --  192 173 185    Basic Metabolic Panel: Recent Labs  Lab 03/20/24 1631 03/21/24 0452 03/21/24 1247 03/22/24 0503 03/22/24 2137 03/23/24 0649  NA 140 141 138 140 139 139  K 4.7 5.5* 5.5* 5.8* 5.0 4.6  CL 97* 98 96* 97* 95* 95*  CO2 32 32  --  32 33* 35*  GLUCOSE 113* 138* 132* 124* 146* 105*  BUN 14 16 19 19 21 22   CREATININE 1.56* 1.53* 1.60* 1.63* 1.66* 1.63*  CALCIUM  9.0  8.8*  --  8.8* 8.4* 8.4*  MG  --  1.7  --  1.7  --  1.8  PHOS  --  4.7*  --   --   --   --    GFR: Estimated Creatinine Clearance: 28.6 mL/min (A) (by C-G formula based on SCr of 1.63 mg/dL (H)). Liver Function Tests: Recent Labs  Lab 03/20/24 1631 03/22/24 2137  AST 16 17  ALT 10 7  ALKPHOS 104 86  BILITOT 0.2 0.3  PROT 6.6 5.9*  ALBUMIN  3.0* 2.6*   No results for input(s): LIPASE, AMYLASE in the last 168 hours. No results for input(s): AMMONIA in the last 168 hours. Coagulation Profile: No results for input(s): INR, PROTIME in the last 168 hours. Cardiac Enzymes: No results for input(s): CKTOTAL, CKMB, CKMBINDEX, TROPONINI in the last 168 hours. BNP (last 3 results) No results for input(s): PROBNP in the last 8760 hours. HbA1C: No results for input(s): HGBA1C in the last 72 hours. CBG: Recent Labs  Lab 03/21/24 1608 03/21/24 1712 03/22/24 1444 03/22/24 1938 03/23/24 0834  GLUCAP 152* 145* 127* 114* 111*   Lipid Profile: No results for input(s): CHOL, HDL, LDLCALC, TRIG, CHOLHDL, LDLDIRECT in the last 72 hours. Thyroid  Function Tests: No results for input(s): TSH, T4TOTAL, FREET4, T3FREE, THYROIDAB in the last 72 hours. Anemia Panel: Recent Labs    03/21/24 0452  VITAMINB12 662  FOLATE 10.7  FERRITIN 24  TIBC 329  IRON  34   Sepsis Labs: Recent Labs  Lab 03/22/24 2137 03/22/24 2355  LATICACIDVEN 0.7 0.6    Recent Results (from the past 240 hours)  Surgical pcr screen     Status: Abnormal   Collection Time: 03/21/24  8:00 AM   Specimen:  Nasal Mucosa; Nasal Swab  Result Value Ref Range Status   MRSA, PCR NEGATIVE NEGATIVE Final   Staphylococcus aureus POSITIVE (A) NEGATIVE Final    Comment: (NOTE) The Xpert SA Assay (FDA approved for NASAL specimens in patients 56 years of age and older), is one component of a comprehensive surveillance program. It is not intended to diagnose infection nor to guide or monitor treatment. Performed at [facility], [address], [place], [place] [address]          Radiology Studies: CT HEAD CODE STROKE WO CONTRAST Result Date: 03/22/2024 EXAM: CT HEAD WITHOUT 03/22/2024 08:08:04 PM TECHNIQUE: CT of the head was performed without the administration of intravenous contrast. Automated exposure control, iterative reconstruction, and/or weight based adjustment of the mA/kV was utilized to reduce the radiation dose to as low as reasonably achievable. COMPARISON: CT head without contrast 01/14/2022. CLINICAL HISTORY: Neuro deficit, acute, stroke suspected. Facial droop. FINDINGS: BRAIN AND VENTRICLES: No acute intracranial hemorrhage. No mass effect or midline shift. No extra-axial fluid collection. No evidence of acute infarct. No hydrocephalus. Remote left occipital lobe infarct is again noted. Scattered subcortical white matter hypoattenuation is mildly advanced for age. [place] stroke program early CT (ASPECT) score ----- Ganglionic (caudate, IC, lentiform nucleus, insula, M1-M3): 7 Supraganglionic (M4-M6): 3 Total: 10 VASCULATURE: Atherosclerotic calcifications are present in the cavernous carotid arteries bilaterally and at the dural margin of both vertebral arteries. No hyperdense vessel is present. ORBITS: No acute abnormality. SINUSES AND MASTOIDS: No acute abnormality. SOFT TISSUES AND SKULL: No acute skull fracture. No acute soft tissue abnormality. IMPRESSION: 1. No acute intracranial abnormality. 2. Remote left occipital lobe infarct. 3. Scattered subcortical white matter  hypoattenuation, mildly advanced for age. 4. Atherosclerotic calcifications in the  cavernous carotid arteries bilaterally and at the dural margin of both vertebral arteries. No hyperdense vessel. The pertinent results were texted to Dr. Vanessa via the [facility] system at 8:20 pm. Electronically signed by: Lonni Necessary MD 03/22/2024 08:21 PM EDT RP Workstation: [id number]   DG Ankle Complete Right Result Date: 03/21/2024 CLINICAL DATA:  Postoperative status. EXAM: RIGHT ANKLE - COMPLETE 3+ VIEW COMPARISON:  March 20, 2024. FINDINGS: Status post surgical internal fixation of comminuted distal right tibial fracture. Nondisplaced distal right fibular fracture is noted. Expected postoperative changes seen in the surrounding soft tissues. IMPRESSION: Status post surgical internal fixation of distal right tibial fracture. Electronically Signed   By: Lynwood Landy Raddle M.D.   On: 03/21/2024 18:22   DG Ankle Complete Right Result Date: 03/21/2024 CLINICAL DATA:  Open reduction and internal fixation of right tibial fracture. EXAM: RIGHT ANKLE - COMPLETE 3+ VIEW; DG C-ARM 1-60 MIN-NO REPORT Radiation exposure index: 4.84 mGy. COMPARISON:  March 20, 2024. FINDINGS: Three intraoperative fluoroscopic images were obtained of the right ankle. These demonstrate surgical fixation of comminuted distal right tibial fracture. IMPRESSION: Fluoroscopic guidance provided during surgical fixation of distal right tibial fracture. Electronically Signed   By: Lynwood Landy Raddle M.D.   On: 03/21/2024 17:02   DG C-Arm 1-60 Min-No Report Result Date: 03/21/2024 CLINICAL DATA:  Open reduction and internal fixation of right tibial fracture. EXAM: RIGHT ANKLE - COMPLETE 3+ VIEW; DG C-ARM 1-60 MIN-NO REPORT Radiation exposure index: 4.84 mGy. COMPARISON:  March 20, 2024. FINDINGS: Three intraoperative fluoroscopic images were obtained of the right ankle. These demonstrate surgical fixation of comminuted distal right tibial fracture.  IMPRESSION: Fluoroscopic guidance provided during surgical fixation of distal right tibial fracture. Electronically Signed   By: Lynwood Landy Raddle M.D.   On: 03/21/2024 17:02   DG C-Arm 1-60 Min-No Report Result Date: 03/21/2024 CLINICAL DATA:  Open reduction and internal fixation of right tibial fracture. EXAM: RIGHT ANKLE - COMPLETE 3+ VIEW; DG C-ARM 1-60 MIN-NO REPORT Radiation exposure index: 4.84 mGy. COMPARISON:  March 20, 2024. FINDINGS: Three intraoperative fluoroscopic images were obtained of the right ankle. These demonstrate surgical fixation of comminuted distal right tibial fracture. IMPRESSION: Fluoroscopic guidance provided during surgical fixation of distal right tibial fracture. Electronically Signed   By: Lynwood Landy Raddle M.D.   On: 03/21/2024 17:02   DG C-Arm 1-60 Min-No Report Result Date: 03/21/2024 CLINICAL DATA:  Open reduction and internal fixation of right tibial fracture. EXAM: RIGHT ANKLE - COMPLETE 3+ VIEW; DG C-ARM 1-60 MIN-NO REPORT Radiation exposure index: 4.84 mGy. COMPARISON:  March 20, 2024. FINDINGS: Three intraoperative fluoroscopic images were obtained of the right ankle. These demonstrate surgical fixation of comminuted distal right tibial fracture. IMPRESSION: Fluoroscopic guidance provided during surgical fixation of distal right tibial fracture. Electronically Signed   By: Lynwood Landy Raddle M.D.   On: 03/21/2024 17:02           LOS: 3 days   Time spent= 35 mins    Burgess JAYSON Dare, MD [facility]  If 7PM-7AM, please contact night-coverage  03/23/2024, 10:05 AM

## 2024-03-23 NOTE — Plan of Care (Signed)

## 2024-03-24 DIAGNOSIS — S82891D Other fracture of right lower leg, subsequent encounter for closed fracture with routine healing: Secondary | ICD-10-CM | POA: Diagnosis not present

## 2024-03-24 LAB — CBC
HCT: 23.4 % — ABNORMAL LOW (ref 36.0–46.0)
Hemoglobin: 7.3 g/dL — ABNORMAL LOW (ref 12.0–15.0)
MCH: 29.9 pg (ref 26.0–34.0)
MCHC: 31.2 g/dL (ref 30.0–36.0)
MCV: 95.9 fL (ref 80.0–100.0)
Platelets: 176 K/uL (ref 150–400)
RBC: 2.44 MIL/uL — ABNORMAL LOW (ref 3.87–5.11)
RDW: 13.1 % (ref 11.5–15.5)
WBC: 9.1 K/uL (ref 4.0–10.5)
nRBC: 0.2 % (ref 0.0–0.2)

## 2024-03-24 LAB — BASIC METABOLIC PANEL WITH GFR
Anion gap: 8 (ref 5–15)
BUN: 23 mg/dL (ref 8–23)
CO2: 35 mmol/L — ABNORMAL HIGH (ref 22–32)
Calcium: 8.4 mg/dL — ABNORMAL LOW (ref 8.9–10.3)
Chloride: 96 mmol/L — ABNORMAL LOW (ref 98–111)
Creatinine, Ser: 1.45 mg/dL — ABNORMAL HIGH (ref 0.44–1.00)
GFR, Estimated: 38 mL/min — ABNORMAL LOW (ref >60)
Glucose, Bld: 82 mg/dL (ref 70–99)
Potassium: 4.7 mmol/L (ref 3.5–5.1)
Sodium: 139 mmol/L (ref 135–145)

## 2024-03-24 LAB — MAGNESIUM: Magnesium: 1.8 mg/dL (ref 1.7–2.4)

## 2024-03-24 LAB — GLUCOSE, CAPILLARY
Glucose-Capillary: 73 mg/dL (ref 70–99)
Glucose-Capillary: 99 mg/dL (ref 70–99)
Glucose-Capillary: 78 mg/dL (ref 70–99)
Glucose-Capillary: 79 mg/dL (ref 70–99)

## 2024-03-24 MED ORDER — ASPIRIN 81 MG PO CHEW
81.0000 mg | CHEWABLE_TABLET | Freq: Two times a day (BID) | ORAL | Status: DC
  Administered 2024-03-24 – 2024-03-27 (×6): 81 mg via ORAL
  Filled 2024-03-24 (×6): qty 1

## 2024-03-24 MED ORDER — VITAMIN D (ERGOCALCIFEROL) 1.25 MG (50000 UNIT) PO CAPS: 50000.0000 [IU] | ORAL_CAPSULE | ORAL | Status: AC

## 2024-03-24 MED ORDER — GLUCERNA SHAKE PO LIQD: 237.0000 mL | Freq: Three times a day (TID) | ORAL | Status: DC

## 2024-03-24 MED ORDER — HYDROMORPHONE HCL 1 MG/ML IJ SOLN: 0.5000 mg | INTRAMUSCULAR | Status: AC | PRN

## 2024-03-24 MED ORDER — TRANEXAMIC ACID-NACL 1000-0.7 MG/100ML-% IV SOLN
1000.0000 mg | Freq: Once | INTRAVENOUS | Status: DC
  Filled 2024-03-24: qty 100

## 2024-03-24 MED ORDER — POLYETHYLENE GLYCOL 3350 17 G PO PACK
17.0000 g | PACK | Freq: Every day | ORAL | Status: DC
  Filled 2024-03-24 (×4): qty 1

## 2024-03-24 MED ORDER — ACETAMINOPHEN 500 MG PO TABS
1000.0000 mg | ORAL_TABLET | Freq: Three times a day (TID) | ORAL | Status: DC
  Administered 2024-03-24 – 2024-03-27 (×8): 1000 mg via ORAL
  Filled 2024-03-24 (×7): qty 2

## 2024-03-24 MED ORDER — CEFAZOLIN SODIUM-DEXTROSE 2-4 GM/100ML-% IV SOLN
2.0000 g | Freq: Three times a day (TID) | INTRAVENOUS | Status: AC
  Administered 2024-03-24 – 2024-03-25 (×3): 2 g via INTRAVENOUS
  Filled 2024-03-24 (×3): qty 100

## 2024-03-24 MED ORDER — SENNOSIDES-DOCUSATE SODIUM 8.6-50 MG PO TABS
1.0000 | ORAL_TABLET | Freq: Two times a day (BID) | ORAL | Status: DC
  Administered 2024-03-24 – 2024-03-26 (×3): 1 via ORAL
  Filled 2024-03-24 (×5): qty 1

## 2024-03-24 NOTE — Progress Notes (Signed)
 PROGRESS NOTE    Beverly Wheeler  FMW:[id number] DOB: Feb 04, 1946 DOA: 03/20/2024 PCP: [place], Betty G, MD    Brief Narrative:  78 year old with history of chronic hypoxic respiratory failure 2 L nasal cannula, COPD, CAD, CHF with reduced EF, CKD 3A, HTN, HLD, DM 2, iron  deficiency anemia presented with ground-level fall causing right distal tibial/fibular fracture.  Orthopedic consulted.  Patient underwent ORIF on 8/28, postop management per their service.  Postop course complicated by concerns of weakness and confusion therefore CT head performed which was unremarkable and was also seen by neurology service.  PT/OT is recommending SNF.   Assessment & Plan:  Principal Problem:   Closed right ankle fracture Active Problems:   Acute encephalopathy   Goals of care, counseling/discussion   Leukocytosis   Closed fracture of distal end of right fibula and tibia   Displaced pilon fracture of right tibia, initial encounter for closed fracture     Ground level fall  C/b closed R distal tibia / fibular fracture  S/p ORIF by ortho on 8/28. Post Op management per their service. Cont wound Vac. Post Op DVT ppx Xarelto .  - Pain control, bowel regimen  Progressive weakness and confusion - Overnight concerns of possible CVA?.  CT head is unremarkable.  Seen by neurology team.   Leukocytosis  Reactive.       Goals of care  She was uncertain re: code status, would like to be full code for now while thinking about this more. Has declined definitive management of her CAD in the past. With her advanced comorbidities recovery during resuscitation is expected to be poor which I discussed with patient  -- For now remains as full code. Continue goals of care discussions.     COPD  Without acute exacerbation, sub home dulera  (not taking PTA), montelukast . Nebs prn   CHRF on 2L O2  continue home O2.   CAD, hx of prior MI  Hx of multivessel CAD and referred for CABG in '15 but declined. Hold  aspirin  periop resume as soon as able. Continue home statin, zetia    HFrEF  Last TTE 9/'24 with LVEF 25-30%, global hypokinesis with local WM abnormalities, Gr 2 DD, biatrial dilation, moderate MR, mild AS. Temporarily pause lasix  (takes three times per week) and Farxiga  preop. Hold Losartan  preop, resume after. Continue home Spironolactone , Bystolic .  Follow-up outpatient [facility] cardiology. Due for outptn follow up and Repeat Echo.   CKD stage IIIa HyperKalemia  Baseline Cr near 1.2 - 1.4. On admission is 1.5, continue to monitor.  Lokelma   HTN  See GDMT meds above   HLD  See CAD above   DM type 2 -Sliding scale and Accu-Cheks  IDA  Hb near baseline ~ 9. Borderline macrocytic. Repeat iron  panel, B12, Folate   Mood d/o  Continue home lexapro    Vitamin D  deficiency - Weekly supplements added   DVT prophylaxis: Xarelto  rivaroxaban  (XARELTO ) tablet 10 mg    Code Status: Full Code Family Communication:   Status is: Inpatient Remains inpatient appropriate because: Pending SNF placement.  Medically stable for now   PT Follow up Recs: Skilled Nursing-Short Term Rehab (<3 Hours/Day)03/22/2024 1300  Subjective: Feels better no complaints  Examination:  General exam: Appears calm and comfortable  Respiratory system: Clear to auscultation. Respiratory effort normal. Cardiovascular system: S1 & S2 heard, RRR. No JVD, murmurs, rubs, gallops or clicks. No pedal edema. Gastrointestinal system: Abdomen is nondistended, soft and nontender. No organomegaly or masses felt. Normal bowel sounds heard. Central  nervous system: Alert and oriented.  Minimal slurred speech which is basically her baseline Extremities: Symmetric 5 x 5 power. Skin: RLE dressing in place.  Psychiatry: Judgement and insight appear normal. Mood & affect appropriate.                Diet Orders (From admission, onward)     Start     Ordered   03/21/24 1854  Diet Carb Modified Fluid consistency: Thin   Diet effective now       Question Answer Comment  Calorie Level Medium 1600-2000   Fluid consistency: Thin      03/21/24 1853            Objective: Vitals:   03/23/24 2014 03/24/24 0546 03/24/24 0827 03/24/24 0913  BP: (!) 136/50 (!) 113/46  (!) 115/37  Pulse: (!) 55 (!) 56  80  Resp: 15 16  17   Temp: 99 F (37.2 C) 98.2 F (36.8 C)  98.6 F (37 C)  TempSrc: Oral Oral  Oral  SpO2: 100% 100% 100% 98%  Weight:      Height:        Intake/Output Summary (Last 24 hours) at 03/24/2024 0952 Last data filed at 03/23/2024 1453 Gross per 24 hour  Intake 240 ml  Output --  Net 240 ml   Filed Weights   03/20/24 1531 03/21/24 1220 03/21/24 1245  Weight: 80.3 kg (P) 80.3 kg 80 kg    Scheduled Meds:  atorvastatin   80 mg Oral QHS   Chlorhexidine  Gluconate Cloth  6 each Topical Daily   cyanocobalamin   1,000 mcg Oral Daily   escitalopram   20 mg Oral Daily   ezetimibe   10 mg Oral Daily   feeding supplement (GLUCERNA SHAKE)  237 mL Oral TID BM   ferrous gluconate   324 mg Oral BID WC   fluticasone  furoate-vilanterol  1 puff Inhalation Daily   insulin  aspart  0-6 Units Subcutaneous TID WC   melatonin  6 mg Oral QHS   montelukast   10 mg Oral QHS   mupirocin  ointment  1 Application Nasal BID   nebivolol   5 mg Oral Daily   ondansetron  (ZOFRAN ) IV  4 mg Intravenous Once   rivaroxaban   10 mg Oral Daily   sodium chloride  flush  3 mL Intravenous Q12H   spironolactone   12.5 mg Oral Daily   Vitamin D  (Ergocalciferol )  50,000 Units Oral Q7 days   Continuous Infusions:  Nutritional status     Body mass index is 33.32 kg/m.  Data Reviewed:   CBC: Recent Labs  Lab 03/20/24 1631 03/21/24 0452 03/21/24 1247 03/22/24 0503 03/22/24 2137 03/23/24 0649 03/24/24 0333  WBC 13.1* 9.6  --  12.1* 9.6 9.0 9.1  NEUTROABS 10.5*  --   --   --  7.4  --   --   HGB 9.1* 8.4* 8.8* 8.2* 7.5* 7.3* 7.3*  HCT 29.4* 27.4* 26.0* 26.6* 23.6* 23.6* 23.4*  MCV 98.7 97.2  --  97.8 95.9 97.1 95.9   PLT 221 197  --  192 173 185 176   Basic Metabolic Panel: Recent Labs  Lab 03/21/24 0452 03/21/24 1247 03/22/24 0503 03/22/24 2137 03/23/24 0649 03/24/24 0333  NA 141 138 140 139 139 139  K 5.5* 5.5* 5.8* 5.0 4.6 4.7  CL 98 96* 97* 95* 95* 96*  CO2 32  --  32 33* 35* 35*  GLUCOSE 138* 132* 124* 146* 105* 82  BUN 16 19 19 21 22 23   CREATININE 1.53* 1.60*  1.63* 1.66* 1.63* 1.45*  CALCIUM  8.8*  --  8.8* 8.4* 8.4* 8.4*  MG 1.7  --  1.7  --  1.8 1.8  PHOS 4.7*  --   --   --   --   --    GFR: Estimated Creatinine Clearance: 32.1 mL/min (A) (by C-G formula based on SCr of 1.45 mg/dL (H)). Liver Function Tests: Recent Labs  Lab 03/20/24 1631 03/22/24 2137  AST 16 17  ALT 10 7  ALKPHOS 104 86  BILITOT 0.2 0.3  PROT 6.6 5.9*  ALBUMIN  3.0* 2.6*   No results for input(s): LIPASE, AMYLASE in the last 168 hours. Recent Labs  Lab 03/23/24 1148  AMMONIA 29   Coagulation Profile: No results for input(s): INR, PROTIME in the last 168 hours. Cardiac Enzymes: No results for input(s): CKTOTAL, CKMB, CKMBINDEX, TROPONINI in the last 168 hours. BNP (last 3 results) No results for input(s): PROBNP in the last 8760 hours. HbA1C: Recent Labs    03/23/24 1148  HGBA1C 6.2*   CBG: Recent Labs  Lab 03/23/24 0834 03/23/24 1116 03/23/24 1707 03/23/24 2300 03/24/24 0642  GLUCAP 111* 112* 103* 85 73   Lipid Profile: No results for input(s): CHOL, HDL, LDLCALC, TRIG, CHOLHDL, LDLDIRECT in the last 72 hours. Thyroid  Function Tests: No results for input(s): TSH, T4TOTAL, FREET4, T3FREE, THYROIDAB in the last 72 hours. Anemia Panel: No results for input(s): VITAMINB12, FOLATE, FERRITIN, TIBC, IRON , RETICCTPCT in the last 72 hours. Sepsis Labs: Recent Labs  Lab 03/22/24 2137 03/22/24 2355  LATICACIDVEN 0.7 0.6    Recent Results (from the past 240 hours)  Surgical pcr screen     Status: Abnormal   Collection Time: 03/21/24   8:00 AM   Specimen: Nasal Mucosa; Nasal Swab  Result Value Ref Range Status   MRSA, PCR NEGATIVE NEGATIVE Final   Staphylococcus aureus POSITIVE (A) NEGATIVE Final    Comment: (NOTE) The Xpert SA Assay (FDA approved for NASAL specimens in patients 83 years of age and older), is one component of a comprehensive surveillance program. It is not intended to diagnose infection nor to guide or monitor treatment. Performed at [facility], [address], [place], [place] [address]          Radiology Studies: CT HEAD CODE STROKE WO CONTRAST Result Date: 03/22/2024 EXAM: CT HEAD WITHOUT 03/22/2024 08:08:04 PM TECHNIQUE: CT of the head was performed without the administration of intravenous contrast. Automated exposure control, iterative reconstruction, and/or weight based adjustment of the mA/kV was utilized to reduce the radiation dose to as low as reasonably achievable. COMPARISON: CT head without contrast 01/14/2022. CLINICAL HISTORY: Neuro deficit, acute, stroke suspected. Facial droop. FINDINGS: BRAIN AND VENTRICLES: No acute intracranial hemorrhage. No mass effect or midline shift. No extra-axial fluid collection. No evidence of acute infarct. No hydrocephalus. Remote left occipital lobe infarct is again noted. Scattered subcortical white matter hypoattenuation is mildly advanced for age. [place] stroke program early CT (ASPECT) score ----- Ganglionic (caudate, IC, lentiform nucleus, insula, M1-M3): 7 Supraganglionic (M4-M6): 3 Total: 10 VASCULATURE: Atherosclerotic calcifications are present in the cavernous carotid arteries bilaterally and at the dural margin of both vertebral arteries. No hyperdense vessel is present. ORBITS: No acute abnormality. SINUSES AND MASTOIDS: No acute abnormality. SOFT TISSUES AND SKULL: No acute skull fracture. No acute soft tissue abnormality. IMPRESSION: 1. No acute intracranial abnormality. 2. Remote left occipital lobe infarct. 3. Scattered subcortical  white matter hypoattenuation, mildly advanced for age. 4. Atherosclerotic calcifications in the cavernous  carotid arteries bilaterally and at the dural margin of both vertebral arteries. No hyperdense vessel. The pertinent results were texted to Dr. Vanessa via the [facility] system at 8:20 pm. Electronically signed by: Lonni Necessary MD 03/22/2024 08:21 PM EDT RP Workstation: [id number]           LOS: 4 days   Time spent= 35 mins    Burgess JAYSON Dare, MD [facility]  If 7PM-7AM, please contact night-coverage  03/24/2024, 9:52 AM

## 2024-03-24 NOTE — Plan of Care (Signed)
   Problem: Clinical Measurements: Goal: Will remain free from infection Outcome: Progressing   Problem: Pain Managment: Goal: General experience of comfort will improve and/or be controlled Outcome: Progressing   Problem: Safety: Goal: Ability to remain free from injury will improve Outcome: Progressing

## 2024-03-24 NOTE — TOC Progression Note (Signed)
 Transition of Care [facility]) - Progression Note    Patient Details  Name: Beverly Wheeler MRN: [id number] Date of Birth: Oct 17, 1945  Transition of Care [facility]) CM/SW Contact  Isaiah Public, LCSWA Phone Number: 03/24/2024, 2:49 PM  Clinical Narrative:     CSW spoke with patient and patients son Beverly Wheeler at bedside. CSW provided patient and patients son with SNF bed offers/medicare compare ratings list for review. Patient and patients son informed CSW they would like to review and discuss and would like CSW to follow up tomorrow on SNF choice. CSW discussed insurance authorization process. Patient informed CSW that she believes her [facility] insurance may be cancelled. CSW provided patients son with [facility] number to call to verify if patient is still covered by [facility]. CSW informed patients son that weekday CSW tomorrow will follow up on SNF choice and insurance with patient and patients son tomorrow. All questions answered. No further questions reported at this time.  Expected Discharge Plan: Skilled Nursing Facility Barriers to Discharge: Continued Medical Work up, SNF Pending bed offer               Expected Discharge Plan and Services In-house Referral: Clinical Social Work   Post Acute Care Choice: Skilled Nursing Facility Living arrangements for the past 2 months: Single Family Home                                       Social Drivers of Health (SDOH) Interventions SDOH Screenings   Food Insecurity: No Food Insecurity (03/22/2024)  Housing: Low Risk  (03/22/2024)  Transportation Needs: No Transportation Needs (03/22/2024)  Utilities: Not At Risk (03/22/2024)  Alcohol Screen: Low Risk  (04/12/2023)  Depression (PHQ2-9): Low Risk  (09/26/2023)  Financial Resource Strain: Low Risk  (04/12/2023)  Physical Activity: Inactive (05/02/2022)  Social Connections: Socially Isolated (03/22/2024)  Stress: No Stress Concern Present (05/02/2022)  Tobacco Use: Medium Risk (03/21/2024)     Readmission Risk Interventions     No data to display

## 2024-03-25 DIAGNOSIS — S82891G Other fracture of right lower leg, subsequent encounter for closed fracture with delayed healing: Secondary | ICD-10-CM | POA: Diagnosis not present

## 2024-03-25 LAB — BASIC METABOLIC PANEL WITH GFR
Anion gap: 14 (ref 5–15)
BUN: 25 mg/dL — ABNORMAL HIGH (ref 8–23)
CO2: 32 mmol/L (ref 22–32)
Calcium: 8.4 mg/dL — ABNORMAL LOW (ref 8.9–10.3)
Chloride: 96 mmol/L — ABNORMAL LOW (ref 98–111)
Creatinine, Ser: 1.47 mg/dL — ABNORMAL HIGH (ref 0.44–1.00)
GFR, Estimated: 37 mL/min — ABNORMAL LOW (ref >60)
Glucose, Bld: 86 mg/dL (ref 70–99)
Potassium: 4.9 mmol/L (ref 3.5–5.1)
Sodium: 142 mmol/L (ref 135–145)

## 2024-03-25 LAB — CBC
HCT: 22.8 % — ABNORMAL LOW (ref 36.0–46.0)
Hemoglobin: 7 g/dL — ABNORMAL LOW (ref 12.0–15.0)
MCH: 29.5 pg (ref 26.0–34.0)
MCHC: 30.7 g/dL (ref 30.0–36.0)
MCV: 96.2 fL (ref 80.0–100.0)
Platelets: 214 K/uL (ref 150–400)
RBC: 2.37 MIL/uL — ABNORMAL LOW (ref 3.87–5.11)
RDW: 13.3 % (ref 11.5–15.5)
WBC: 8.7 K/uL (ref 4.0–10.5)
nRBC: 0 % (ref 0.0–0.2)

## 2024-03-25 LAB — GLUCOSE, CAPILLARY
Glucose-Capillary: 85 mg/dL (ref 70–99)
Glucose-Capillary: 75 mg/dL (ref 70–99)
Glucose-Capillary: 84 mg/dL (ref 70–99)
Glucose-Capillary: 108 mg/dL — ABNORMAL HIGH (ref 70–99)
Glucose-Capillary: 90 mg/dL (ref 70–99)

## 2024-03-25 LAB — HEMOGLOBIN AND HEMATOCRIT, BLOOD
HCT: 26.6 % — ABNORMAL LOW (ref 36.0–46.0)
Hemoglobin: 8.7 g/dL — ABNORMAL LOW (ref 12.0–15.0)

## 2024-03-25 LAB — MAGNESIUM: Magnesium: 1.9 mg/dL (ref 1.7–2.4)

## 2024-03-25 LAB — PREPARE RBC (CROSSMATCH)

## 2024-03-25 MED ORDER — IRON SUCROSE 400 MG IVPB - SIMPLE MED
400.0000 mg | Freq: Once | Status: DC
  Filled 2024-03-25: qty 270

## 2024-03-25 MED ORDER — FERROUS SULFATE 325 (65 FE) MG PO TABS: 325.0000 mg | ORAL_TABLET | Freq: Every day | ORAL | Status: DC

## 2024-03-25 MED ORDER — SODIUM CHLORIDE 0.9 % IV SOLN
400.0000 mg | Freq: Once | INTRAVENOUS | Status: AC
  Administered 2024-03-25: 400 mg via INTRAVENOUS
  Filled 2024-03-25: qty 20

## 2024-03-25 MED ORDER — SODIUM CHLORIDE 0.9% IV SOLUTION: Freq: Once | INTRAVENOUS | Status: AC

## 2024-03-25 NOTE — TOC Progression Note (Signed)
 Transition of Care [facility]) - Progression Note    Patient Details  Name: Beverly Wheeler MRN: [id number] Date of Birth: 07/20/1946  Transition [facility] Care [facility]) CM/SW Contact  Bridget Cordella Simmonds, LCSW Phone Number: 03/25/2024, 12:03 PM  Clinical Narrative:  CSW spoke with pt regarding SNF choice, she requested CSW speak with her son.  Spoke with son Beverly Wheeler by phone, he will be back at the hospital this afternoon and will make decision with pt regarding bed offers.  He is also asking about whether pt insurance is cancelled.     Expected Discharge Plan: Skilled Nursing Facility Barriers to Discharge: Continued Medical Work up, SNF Pending bed offer               Expected Discharge Plan and Services In-house Referral: Clinical Social Work   Post Acute Care Choice: Skilled Nursing Facility Living arrangements for the past 2 months: Single Family Home                                       Social Drivers of Health (SDOH) Interventions SDOH Screenings   Food Insecurity: No Food Insecurity (03/22/2024)  Housing: Low Risk  (03/22/2024)  Transportation Needs: No Transportation Needs (03/22/2024)  Utilities: Not At Risk (03/22/2024)  Alcohol Screen: Low Risk  (04/12/2023)  Depression (PHQ2-9): Low Risk  (09/26/2023)  Financial Resource Strain: Low Risk  (04/12/2023)  Physical Activity: Inactive (05/02/2022)  Social Connections: Socially Isolated (03/22/2024)  Stress: No Stress Concern Present (05/02/2022)  Tobacco Use: Medium Risk (03/21/2024)    Readmission Risk Interventions     No data to display

## 2024-03-25 NOTE — Plan of Care (Signed)

## 2024-03-25 NOTE — Progress Notes (Signed)
 PROGRESS NOTE    Beverly Wheeler  FMW:[id number] DOB: 04-Nov-1945 DOA: 03/20/2024 PCP: [place], Betty G, MD    Brief Narrative:  78 year old with history of chronic hypoxic respiratory failure 2 L nasal cannula, COPD, CAD, CHF with reduced EF, CKD 3A, HTN, HLD, DM 2, iron  deficiency anemia presented with ground-level fall causing right distal tibial/fibular fracture.  Orthopedic consulted.  Patient underwent ORIF on 8/28, postop management per their service.  Postop course complicated by concerns of weakness and confusion therefore CT head performed which was unremarkable and was also seen by neurology service.  PT/OT is recommending SNF.   Assessment & Plan:  Principal Problem:   Closed right ankle fracture Active Problems:   Acute encephalopathy   Goals of care, counseling/discussion   Leukocytosis   Closed fracture of distal end of right fibula and tibia   Displaced pilon fracture of right tibia, initial encounter for closed fracture     Ground level fall  C/b closed R distal tibia / fibular fracture  S/p ORIF by ortho on 8/28. Post Op management per their service. Cont wound Vac. Post Op DVT ppx Xarelto .  - Pain control, bowel regimen  Progressive weakness and confusion - Overnight concerns of possible CVA?.  CT head is unremarkable.  Seen by neurology team.   Leukocytosis  Reactive.       Goals of care  She was uncertain re: code status, would like to be full code for now while thinking about this more. Has declined definitive management of her CAD in the past. With her advanced comorbidities recovery during resuscitation is expected to be poor which I discussed with patient  -- For now remains as full code. Continue goals of care discussions.     COPD  Without acute exacerbation, sub home dulera  (not taking PTA), montelukast . Nebs prn   CHRF on 2L O2  continue home O2.   CAD, hx of prior MI  Hx of multivessel CAD and referred for CABG in '15 but declined. Hold  aspirin  periop resume as soon as able. Continue home statin, zetia    HFrEF  Last TTE 9/'24 with LVEF 25-30%, global hypokinesis with local WM abnormalities, Gr 2 DD, biatrial dilation, moderate MR, mild AS. Temporarily pause lasix  (takes three times per week) and Farxiga  preop. Hold Losartan  preop, resume after. Continue home Spironolactone , Bystolic .  Follow-up outpatient [facility] cardiology. Due for outptn follow up and Repeat Echo.   CKD stage IIIa HyperKalemia  Baseline Cr near 1.2 - 1.4. On admission is 1.5, continue to monitor.  Lokelma   HTN  See GDMT meds above   HLD  See CAD above   DM type 2 -Sliding scale and Accu-Cheks  IDA  Hb near baseline ~ 9. Borderline macrocytic. R B12 and folate are normal - Borderline low ferritin and iron  saturation - Hemoglobin down to 7.  Will order IV iron  and discussed 1 unit PRBC transfusion  Mood d/o  Continue home lexapro    Vitamin D  deficiency - Weekly supplements added   DVT prophylaxis: Xarelto  rivaroxaban  (XARELTO ) tablet 10 mg    Code Status: Full Code Family Communication:   Status is: Inpatient Remains inpatient appropriate because: Pending SNF placement.  Medically stable for now   PT Follow up Recs: Skilled Nursing-Short Term Rehab (<3 Hours/Day)03/22/2024 1300  Subjective: Feels better no complaints Agreeable to 1 unit PRBC transfusion Examination:  General exam: Appears calm and comfortable  Respiratory system: Clear to auscultation. Respiratory effort normal. Cardiovascular system: S1 & S2 heard, RRR.  No JVD, murmurs, rubs, gallops or clicks. No pedal edema. Gastrointestinal system: Abdomen is nondistended, soft and nontender. No organomegaly or masses felt. Normal bowel sounds heard. Central nervous system: Alert and oriented.  Minimal slurred speech which is basically her baseline Extremities: Symmetric 5 x 5 power. Skin: RLE dressing in place.  Psychiatry: Judgement and insight appear normal. Mood & affect  appropriate.                Diet Orders (From admission, onward)     Start     Ordered   03/21/24 1854  Diet Carb Modified Fluid consistency: Thin  Diet effective now       Question Answer Comment  Calorie Level Medium 1600-2000   Fluid consistency: Thin      03/21/24 1853            Objective: Vitals:   03/24/24 2011 03/25/24 0403 03/25/24 0724 03/25/24 0813  BP: (!) 117/43 (!) 129/45  (!) 121/46  Pulse: (!) 57 (!) 56  61  Resp: 15 15  16   Temp: 98.6 F (37 C) 97.8 F (36.6 C)  97.7 F (36.5 C)  TempSrc: Oral Oral  Oral  SpO2: 96% 97% 98% 94%  Weight:      Height:        Intake/Output Summary (Last 24 hours) at 03/25/2024 1120 Last data filed at 03/25/2024 9077 Gross per 24 hour  Intake 120 ml  Output 350 ml  Net -230 ml   Filed Weights   03/20/24 1531 03/21/24 1220 03/21/24 1245  Weight: 80.3 kg (P) 80.3 kg 80 kg    Scheduled Meds:  sodium chloride    Intravenous Once   acetaminophen   1,000 mg Oral Q8H   aspirin   81 mg Oral BID   atorvastatin   80 mg Oral QHS   Chlorhexidine  Gluconate Cloth  6 each Topical Daily   cyanocobalamin   1,000 mcg Oral Daily   escitalopram   20 mg Oral Daily   ezetimibe   10 mg Oral Daily   feeding supplement (GLUCERNA SHAKE)  237 mL Oral TID BM   ferrous gluconate   324 mg Oral BID WC   fluticasone  furoate-vilanterol  1 puff Inhalation Daily   insulin  aspart  0-6 Units Subcutaneous TID WC   melatonin  6 mg Oral QHS   montelukast   10 mg Oral QHS   mupirocin  ointment  1 Application Nasal BID   nebivolol   5 mg Oral Daily   ondansetron  (ZOFRAN ) IV  4 mg Intravenous Once   polyethylene glycol  17 g Oral Daily   rivaroxaban   10 mg Oral Daily   senna-docusate  1 tablet Oral BID   sodium chloride  flush  3 mL Intravenous Q12H   spironolactone   12.5 mg Oral Daily   Vitamin D  (Ergocalciferol )  50,000 Units Oral Q7 days   Continuous Infusions:   ceFAZolin  (ANCEF ) IV 2 g (03/25/24 0546)   iron  sucrose     tranexamic acid   Stopped (03/24/24 2035)    Nutritional status     Body mass index is 33.32 kg/m.  Data Reviewed:   CBC: Recent Labs  Lab 03/20/24 1631 03/21/24 0452 03/22/24 0503 03/22/24 2137 03/23/24 0649 03/24/24 0333 03/25/24 0653  WBC 13.1*   < > 12.1* 9.6 9.0 9.1 8.7  NEUTROABS 10.5*  --   --  7.4  --   --   --   HGB 9.1*   < > 8.2* 7.5* 7.3* 7.3* 7.0*  HCT 29.4*   < > 26.6*  23.6* 23.6* 23.4* 22.8*  MCV 98.7   < > 97.8 95.9 97.1 95.9 96.2  PLT 221   < > 192 173 185 176 214   < > = values in this interval not displayed.   Basic Metabolic Panel: Recent Labs  Lab 03/21/24 0452 03/21/24 1247 03/22/24 0503 03/22/24 2137 03/23/24 0649 03/24/24 0333 03/25/24 0653  NA 141   < > 140 139 139 139 142  K 5.5*   < > 5.8* 5.0 4.6 4.7 4.9  CL 98   < > 97* 95* 95* 96* 96*  CO2 32  --  32 33* 35* 35* 32  GLUCOSE 138*   < > 124* 146* 105* 82 86  BUN 16   < > 19 21 22 23  25*  CREATININE 1.53*   < > 1.63* 1.66* 1.63* 1.45* 1.47*  CALCIUM  8.8*  --  8.8* 8.4* 8.4* 8.4* 8.4*  MG 1.7  --  1.7  --  1.8 1.8 1.9  PHOS 4.7*  --   --   --   --   --   --    < > = values in this interval not displayed.   GFR: Estimated Creatinine Clearance: 31.7 mL/min (A) (by C-G formula based on SCr of 1.47 mg/dL (H)). Liver Function Tests: Recent Labs  Lab 03/20/24 1631 03/22/24 2137  AST 16 17  ALT 10 7  ALKPHOS 104 86  BILITOT 0.2 0.3  PROT 6.6 5.9*  ALBUMIN  3.0* 2.6*   No results for input(s): LIPASE, AMYLASE in the last 168 hours. Recent Labs  Lab 03/23/24 1148  AMMONIA 29   Coagulation Profile: No results for input(s): INR, PROTIME in the last 168 hours. Cardiac Enzymes: No results for input(s): CKTOTAL, CKMB, CKMBINDEX, TROPONINI in the last 168 hours. BNP (last 3 results) No results for input(s): PROBNP in the last 8760 hours. HbA1C: Recent Labs    03/23/24 1148  HGBA1C 6.2*   CBG: Recent Labs  Lab 03/24/24 1221 03/24/24 1612 03/24/24 2128 03/25/24 0619  03/25/24 0812  GLUCAP 99 78 79 85 75   Lipid Profile: No results for input(s): CHOL, HDL, LDLCALC, TRIG, CHOLHDL, LDLDIRECT in the last 72 hours. Thyroid  Function Tests: No results for input(s): TSH, T4TOTAL, FREET4, T3FREE, THYROIDAB in the last 72 hours. Anemia Panel: No results for input(s): VITAMINB12, FOLATE, FERRITIN, TIBC, IRON , RETICCTPCT in the last 72 hours. Sepsis Labs: Recent Labs  Lab 03/22/24 2137 03/22/24 2355  LATICACIDVEN 0.7 0.6    Recent Results (from the past 240 hours)  Surgical pcr screen     Status: Abnormal   Collection Time: 03/21/24  8:00 AM   Specimen: Nasal Mucosa; Nasal Swab  Result Value Ref Range Status   MRSA, PCR NEGATIVE NEGATIVE Final   Staphylococcus aureus POSITIVE (A) NEGATIVE Final    Comment: (NOTE) The Xpert SA Assay (FDA approved for NASAL specimens in patients 47 years of age and older), is one component of a comprehensive surveillance program. It is not intended to diagnose infection nor to guide or monitor treatment. Performed at [facility], [address], [place], [place] [address]          Radiology Studies: No results found.         LOS: 5 days   Time spent= 35 mins    Burgess JAYSON Dare, MD [facility]  If 7PM-7AM, please contact night-coverage  03/25/2024, 11:20 AM

## 2024-03-26 ENCOUNTER — Encounter (HOSPITAL_COMMUNITY): Payer: Self-pay | Admitting: Orthopedic Surgery

## 2024-03-26 DIAGNOSIS — S82891D Other fracture of right lower leg, subsequent encounter for closed fracture with routine healing: Secondary | ICD-10-CM | POA: Diagnosis not present

## 2024-03-26 LAB — TYPE AND SCREEN
ABO/RH(D): O POS
Antibody Screen: NEGATIVE
Unit division: 0

## 2024-03-26 LAB — CBC
HCT: 28.6 % — ABNORMAL LOW (ref 36.0–46.0)
Hemoglobin: 9.2 g/dL — ABNORMAL LOW (ref 12.0–15.0)
MCH: 30.3 pg (ref 26.0–34.0)
MCHC: 32.2 g/dL (ref 30.0–36.0)
MCV: 94.1 fL (ref 80.0–100.0)
Platelets: 223 K/uL (ref 150–400)
RBC: 3.04 MIL/uL — ABNORMAL LOW (ref 3.87–5.11)
RDW: 13.7 % (ref 11.5–15.5)
WBC: 8.8 K/uL (ref 4.0–10.5)
nRBC: 0 % (ref 0.0–0.2)

## 2024-03-26 LAB — BASIC METABOLIC PANEL WITH GFR
Anion gap: 8 (ref 5–15)
BUN: 21 mg/dL (ref 8–23)
CO2: 35 mmol/L — ABNORMAL HIGH (ref 22–32)
Calcium: 8.3 mg/dL — ABNORMAL LOW (ref 8.9–10.3)
Chloride: 96 mmol/L — ABNORMAL LOW (ref 98–111)
Creatinine, Ser: 1.21 mg/dL — ABNORMAL HIGH (ref 0.44–1.00)
GFR, Estimated: 47 mL/min — ABNORMAL LOW (ref >60)
Glucose, Bld: 88 mg/dL (ref 70–99)
Potassium: 4.8 mmol/L (ref 3.5–5.1)
Sodium: 139 mmol/L (ref 135–145)

## 2024-03-26 LAB — BPAM RBC
Blood Product Expiration Date: 202509212359
ISSUE DATE / TIME: 202509011733
Unit Type and Rh: 5100

## 2024-03-26 LAB — MAGNESIUM: Magnesium: 1.9 mg/dL (ref 1.7–2.4)

## 2024-03-26 LAB — GLUCOSE, CAPILLARY
Glucose-Capillary: 74 mg/dL (ref 70–99)
Glucose-Capillary: 92 mg/dL (ref 70–99)
Glucose-Capillary: 80 mg/dL (ref 70–99)
Glucose-Capillary: 120 mg/dL — ABNORMAL HIGH (ref 70–99)

## 2024-03-26 NOTE — Discharge Summary (Signed)
 Physician Discharge Summary  Kaity Pitstick FMW:[id number] DOB: 11/23/1945 DOA: 03/20/2024  PCP: [place], Betty G, MD  Admit date: 03/20/2024 Discharge date: 03/26/2024  Admitted From: Home Disposition: SNF  Recommendations for Outpatient Follow-up:  Follow up with PCP in 1-2 weeks Please obtain BMP/CBC in one week your next doctors visit.  Xarelto  for DVT prophylaxis Outpatient follow-up with orthopedic   Discharge Condition: Stable CODE STATUS: Full code Diet recommendation: Heart healthy  Brief/Interim Summary: Brief Narrative:  78 year old with history of chronic hypoxic respiratory failure 2 L nasal cannula, COPD, CAD, CHF with reduced EF, CKD 3A, HTN, HLD, DM 2, iron  deficiency anemia presented with ground-level fall causing right distal tibial/fibular fracture.  Orthopedic consulted.  Patient underwent ORIF on 8/28, postop management per their service.  Postop course complicated by concerns of weakness and confusion therefore CT head performed which was unremarkable and was also seen by neurology service.  PT/OT is recommending SNF.  Currently awaiting placement. Patient will be on Xarelto  for DVT prophylaxis.  Assessment & Plan:  Principal Problem:   Closed right ankle fracture Active Problems:   Acute encephalopathy   Goals of care, counseling/discussion   Leukocytosis   Closed fracture of distal end of right fibula and tibia   Displaced pilon fracture of right tibia, initial encounter for closed fracture     Ground level fall  C/b closed R distal tibia / fibular fracture  S/p ORIF by ortho on 8/28. Post Op management per their service. Cont wound Vac. Post Op DVT ppx Xarelto .  - Pain control, bowel regimen  Progressive weakness and confusion - Stable this morning.  CT of the head is negative   Leukocytosis  Resolved    Goals of care  Wishes to be full code    COPD  Without acute exacerbation, sub home dulera  (not taking PTA), montelukast . Nebs prn    CHRF on 2L O2  continue home O2.  As needed bronchodilators  CAD, hx of prior MI  Hx of multivessel CAD and referred for CABG in '15 but declined. Hold aspirin  periop resume as soon as able. Continue home statin, zetia    HFrEF  Last TTE 9/'24 with LVEF 25-30%, global hypokinesis with local WM abnormalities, Gr 2 DD, biatrial dilation, moderate MR, mild AS. Temporarily pause lasix  (takes three times per week) and Farxiga  preop. Hold Losartan  preop, resume after. Continue home Spironolactone , Bystolic .  Follow-up outpatient [facility] cardiology.  -Due for outptn follow up and Repeat Echo.   CKD stage IIIa HyperKalemia  Baseline Cr near 1.2 - 1.4. On admission is 1.5, continue to monitor.  Lokelma   HTN  See GDMT meds above   HLD  See CAD above   DM type 2 -Sliding scale and Accu-Cheks  IDA  Hb near baseline ~ 9. Borderline macrocytic. R B12 and folate are normal - Borderline low ferritin and iron  saturation - Postoperatively hemoglobin drifted down to 7.0.  Received 1 dose of IV iron  and 1 unit of PRBC.  Hemoglobin is now stable.  Mood d/o  Continue home lexapro    Vitamin D  deficiency - Weekly supplements added   DVT prophylaxis: Xarelto     Code Status: Full Code Family Communication:   Status is: Inpatient Remains inpatient appropriate because:  stable, SNF placement   PT Follow up Recs: Skilled Nursing-Short Term Rehab (<3 Hours/Day)03/22/2024 1300  Subjective: Doing well no complaints today Examination:  General exam: Appears calm and comfortable  Respiratory system: Clear to auscultation. Respiratory effort normal. Cardiovascular system: S1 &  S2 heard, RRR. No JVD, murmurs, rubs, gallops or clicks. No pedal edema. Gastrointestinal system: Abdomen is nondistended, soft and nontender. No organomegaly or masses felt. Normal bowel sounds heard. Central nervous system: Alert and oriented.  Minimal slurred speech which is basically her baseline Extremities: Symmetric 5  x 5 power. Skin: RLE dressing in place.  Psychiatry: Judgement and insight appear normal. Mood & affect appropriate.    Discharge Diagnoses:  Principal Problem:   Closed right ankle fracture Active Problems:   Acute encephalopathy   Goals of care, counseling/discussion   Leukocytosis   Closed fracture of distal end of right fibula and tibia   Displaced pilon fracture of right tibia, initial encounter for closed fracture      Discharge Exam: Vitals:   03/26/24 0525 03/26/24 0757  BP: 133/65 (!) 133/58  Pulse:  (!) 57  Resp: 17 16  Temp: 98 F (36.7 C) (!) 97.5 F (36.4 C)  SpO2: 98% 95%   Vitals:   03/25/24 1952 03/25/24 2037 03/26/24 0525 03/26/24 0757  BP: (!) 136/44 (!) 141/48 133/65 (!) 133/58  Pulse: 64 64  (!) 57  Resp:  17 17 16   Temp: 98.2 F (36.8 C) 98.5 F (36.9 C) 98 F (36.7 C) (!) 97.5 F (36.4 C)  TempSrc: Oral Oral Oral Oral  SpO2: 97% 98% 98% 95%  Weight:      Height:          Discharge Instructions   Allergies as of 03/26/2024   No Known Allergies      Medication List     STOP taking these medications    aspirin  EC 81 MG tablet       TAKE these medications    Accu-Chek Aviva Plus w/Device Kit Use to test blood sugars 1-2 times daily. Dx: E11.9   Accu-Chek Guide Test test strip Generic drug: glucose blood USE TO CHECK BLOOD SUGARS 4 TIMES DAILY   Accu-Chek Softclix Lancets lancets Use to check blood sugars 1-2 times daily. Dx:e11.9   acetaminophen  500 MG tablet Commonly known as: TYLENOL  Take 2 tablets (1,000 mg total) by mouth every 8 (eight) hours for 14 days.   albuterol  108 (90 Base) MCG/ACT inhaler Commonly known as: VENTOLIN  HFA INHALE 1 PUFF EVERY 4 HOURS AS NEEDED FOR WHEEZING OR SHORTNESS OF BREATH.   atorvastatin  80 MG tablet Commonly known as: LIPITOR  TAKE 1 TABLET BY MOUTH EVERY DAY AT 6PM   cetirizine 10 MG tablet Commonly known as: ZYRTEC Take 10 mg by mouth daily.   cyanocobalamin  1000 MCG  tablet Take 1 tablet (1,000 mcg total) by mouth daily.   dapagliflozin  propanediol 10 MG Tabs tablet Commonly known as: FARXIGA  Take 1 tablet (10 mg total) by mouth daily.   Dulera  200-5 MCG/ACT Aero Generic drug: mometasone -formoterol  Inhale 2 puffs into the lungs daily.   escitalopram  20 MG tablet Commonly known as: LEXAPRO  TAKE 1 TABLET BY MOUTH EVERY DAY   ezetimibe  10 MG tablet Commonly known as: ZETIA  Take 1 tablet (10 mg total) by mouth daily.   ferrous gluconate  324 MG tablet Commonly known as: FERGON TAKE 1 TABLET BY MOUTH TWICE A DAY WITH MEALS   furosemide  40 MG tablet Commonly known as: LASIX  TAKE 1 TABLET (40 MG TOTAL) BY MOUTH 3 (THREE) TIMES A WEEK.   losartan  25 MG tablet Commonly known as: COZAAR  Take 0.5 tablets (12.5 mg total) by mouth daily.   metFORMIN  500 MG tablet Commonly known as: GLUCOPHAGE  TAKE 1 TABLET BY MOUTH TWICE  A DAY WITH FOOD   montelukast  10 MG tablet Commonly known as: SINGULAIR  Take 1 tablet (10 mg total) by mouth at bedtime.   nebivolol  5 MG tablet Commonly known as: BYSTOLIC  TAKE 1 TABLET BY MOUTH EVERY DAY   nitroGLYCERIN  0.4 MG SL tablet Commonly known as: Nitrostat  Place 1 tablet (0.4 mg total) under the tongue every 5 (five) minutes as needed for chest pain.   oxyCODONE  5 MG immediate release tablet Commonly known as: Oxy IR/ROXICODONE  Take 1 tablet (5 mg total) by mouth every 4 (four) hours as needed for up to 7 days for severe pain (pain score 7-10).   PEPCID  PO Take 1 tablet by mouth daily.   polyethylene glycol 17 g packet Commonly known as: MIRALAX  / GLYCOLAX  Take 17 g by mouth daily for 14 days.   rivaroxaban  10 MG Tabs tablet Commonly known as: XARELTO  Take 1 tablet (10 mg total) by mouth daily.   senna-docusate 8.6-50 MG tablet Commonly known as: Senokot-S Take 1 tablet by mouth 2 (two) times daily for 14 days.   spironolactone  25 MG tablet Commonly known as: ALDACTONE  Take 0.5 tablets (12.5 mg  total) by mouth daily.   Vitamin D  (Ergocalciferol ) 1.25 MG (50000 UNIT) Caps capsule Commonly known as: DRISDOL  Take 1 capsule (50,000 Units total) by mouth every 7 (seven) days for 4 doses.        Follow-up Information     [place], Betty G, MD Follow up in 1 week(s).   Specialty: Family Medicine Contact information: [address] [place] [address] [phone]                No Known Allergies  You were cared for by a hospitalist during your hospital stay. If you have any questions about your discharge medications or the care you received while you were in the hospital after you are discharged, you can call the unit and asked to speak with the hospitalist on call if the hospitalist that took care of you is not available. Once you are discharged, your primary care physician will handle any further medical issues. Please note that no refills for any discharge medications will be authorized once you are discharged, as it is imperative that you return to your primary care physician (or establish a relationship with a primary care physician if you do not have one) for your aftercare needs so that they can reassess your need for medications and monitor your lab values.  You were cared for by a hospitalist during your hospital stay. If you have any questions about your discharge medications or the care you received while you were in the hospital after you are discharged, you can call the unit and asked to speak with the hospitalist on call if the hospitalist that took care of you is not available. Once you are discharged, your primary care physician will handle any further medical issues. Please note that NO REFILLS for any discharge medications will be authorized once you are discharged, as it is imperative that you return to your primary care physician (or establish a relationship with a primary care physician if you do not have one) for your aftercare needs so that they can  reassess your need for medications and monitor your lab values.  Please request your Prim.MD to go over all Hospital Tests and Procedure/Radiological results at the follow up, please get all Hospital records sent to your Prim MD by signing hospital release before you go home.  Get CBC, CMP,  2 view Chest X ray checked  by Primary MD during your next visit or SNF MD in 5-7 days ( we routinely change or add medications that can affect your baseline labs and fluid status, therefore we recommend that you get the mentioned basic workup next visit with your PCP, your PCP may decide not to get them or add new tests based on their clinical decision)  On your next visit with your primary care physician please Get Medicines reviewed and adjusted.  If you experience worsening of your admission symptoms, develop shortness of breath, life threatening emergency, suicidal or homicidal thoughts you must seek medical attention immediately by calling 911 or calling your MD immediately  if symptoms less severe.  You Must read complete instructions/literature along with all the possible adverse reactions/side effects for all the Medicines you take and that have been prescribed to you. Take any new Medicines after you have completely understood and accpet all the possible adverse reactions/side effects.   Do not drive, operate heavy machinery, perform activities at heights, swimming or participation in water activities or provide baby sitting services if your were admitted for syncope or siezures until you have seen by Primary MD or a Neurologist and advised to do so again.  Do not drive when taking Pain medications.   Procedures/Studies: CT HEAD CODE STROKE WO CONTRAST Result Date: 03/22/2024 EXAM: CT HEAD WITHOUT 03/22/2024 08:08:04 PM TECHNIQUE: CT of the head was performed without the administration of intravenous contrast. Automated exposure control, iterative reconstruction, and/or weight based adjustment of the  mA/kV was utilized to reduce the radiation dose to as low as reasonably achievable. COMPARISON: CT head without contrast 01/14/2022. CLINICAL HISTORY: Neuro deficit, acute, stroke suspected. Facial droop. FINDINGS: BRAIN AND VENTRICLES: No acute intracranial hemorrhage. No mass effect or midline shift. No extra-axial fluid collection. No evidence of acute infarct. No hydrocephalus. Remote left occipital lobe infarct is again noted. Scattered subcortical white matter hypoattenuation is mildly advanced for age. [place] stroke program early CT (ASPECT) score ----- Ganglionic (caudate, IC, lentiform nucleus, insula, M1-M3): 7 Supraganglionic (M4-M6): 3 Total: 10 VASCULATURE: Atherosclerotic calcifications are present in the cavernous carotid arteries bilaterally and at the dural margin of both vertebral arteries. No hyperdense vessel is present. ORBITS: No acute abnormality. SINUSES AND MASTOIDS: No acute abnormality. SOFT TISSUES AND SKULL: No acute skull fracture. No acute soft tissue abnormality. IMPRESSION: 1. No acute intracranial abnormality. 2. Remote left occipital lobe infarct. 3. Scattered subcortical white matter hypoattenuation, mildly advanced for age. 4. Atherosclerotic calcifications in the cavernous carotid arteries bilaterally and at the dural margin of both vertebral arteries. No hyperdense vessel. The pertinent results were texted to Dr. Vanessa via the [facility] system at 8:20 pm. Electronically signed by: Lonni Necessary MD 03/22/2024 08:21 PM EDT RP Workstation: [id number]   DG Ankle Complete Right Result Date: 03/21/2024 CLINICAL DATA:  Postoperative status. EXAM: RIGHT ANKLE - COMPLETE 3+ VIEW COMPARISON:  March 20, 2024. FINDINGS: Status post surgical internal fixation of comminuted distal right tibial fracture. Nondisplaced distal right fibular fracture is noted. Expected postoperative changes seen in the surrounding soft tissues. IMPRESSION: Status post surgical internal fixation of  distal right tibial fracture. Electronically Signed   By: Lynwood Landy Raddle M.D.   On: 03/21/2024 18:22   DG Ankle Complete Right Result Date: 03/21/2024 CLINICAL DATA:  Open reduction and internal fixation of right tibial fracture. EXAM: RIGHT ANKLE - COMPLETE 3+ VIEW; DG C-ARM 1-60 MIN-NO REPORT Radiation exposure index: 4.84 mGy. COMPARISON:  March 20, 2024. FINDINGS: Three intraoperative fluoroscopic images were obtained of the right ankle. These demonstrate surgical fixation of comminuted distal right tibial fracture. IMPRESSION: Fluoroscopic guidance provided during surgical fixation of distal right tibial fracture. Electronically Signed   By: Lynwood Landy Raddle M.D.   On: 03/21/2024 17:02   DG C-Arm 1-60 Min-No Report Result Date: 03/21/2024 CLINICAL DATA:  Open reduction and internal fixation of right tibial fracture. EXAM: RIGHT ANKLE - COMPLETE 3+ VIEW; DG C-ARM 1-60 MIN-NO REPORT Radiation exposure index: 4.84 mGy. COMPARISON:  March 20, 2024. FINDINGS: Three intraoperative fluoroscopic images were obtained of the right ankle. These demonstrate surgical fixation of comminuted distal right tibial fracture. IMPRESSION: Fluoroscopic guidance provided during surgical fixation of distal right tibial fracture. Electronically Signed   By: Lynwood Landy Raddle M.D.   On: 03/21/2024 17:02   DG C-Arm 1-60 Min-No Report Result Date: 03/21/2024 CLINICAL DATA:  Open reduction and internal fixation of right tibial fracture. EXAM: RIGHT ANKLE - COMPLETE 3+ VIEW; DG C-ARM 1-60 MIN-NO REPORT Radiation exposure index: 4.84 mGy. COMPARISON:  March 20, 2024. FINDINGS: Three intraoperative fluoroscopic images were obtained of the right ankle. These demonstrate surgical fixation of comminuted distal right tibial fracture. IMPRESSION: Fluoroscopic guidance provided during surgical fixation of distal right tibial fracture. Electronically Signed   By: Lynwood Landy Raddle M.D.   On: 03/21/2024 17:02   DG C-Arm 1-60 Min-No  Report Result Date: 03/21/2024 CLINICAL DATA:  Open reduction and internal fixation of right tibial fracture. EXAM: RIGHT ANKLE - COMPLETE 3+ VIEW; DG C-ARM 1-60 MIN-NO REPORT Radiation exposure index: 4.84 mGy. COMPARISON:  March 20, 2024. FINDINGS: Three intraoperative fluoroscopic images were obtained of the right ankle. These demonstrate surgical fixation of comminuted distal right tibial fracture. IMPRESSION: Fluoroscopic guidance provided during surgical fixation of distal right tibial fracture. Electronically Signed   By: Lynwood Landy Raddle M.D.   On: 03/21/2024 17:02   CT ANKLE RIGHT WO CONTRAST Result Date: 03/20/2024 CLINICAL DATA:  Fracture of the right tibia and fibula. Surgical planning. EXAM: CT OF THE RIGHT ANKLE WITHOUT CONTRAST TECHNIQUE: Multidetector CT imaging of the right ankle was performed according to the standard protocol. Multiplanar CT image reconstructions were also generated. RADIATION DOSE REDUCTION: This exam was performed according to the departmental dose-optimization program which includes automated exposure control, adjustment of the mA and/or kV according to patient size and/or use of iterative reconstruction technique. COMPARISON:  Earlier radiograph dated 03/20/2024. FINDINGS: Bones/Joint/Cartilage There is a mildly angulated oblique fracture of the distal fibula. There is a comminuted, mildly displaced and angulated fracture of the distal tibial metadiaphysis with extension into the articular surface of the tibial plafond. There is no dislocation. There is severe osteopenia. Ligaments Suboptimally assessed by CT. Muscles and Tendons No acute findings. Soft tissues There is diffuse skin thickening and subcutaneous edema of the distal like and foot. No fluid collection or soft tissue gas. An overlying cast noted. IMPRESSION: 1. Comminuted, mildly displaced and angulated intra-articular fracture of the distal tibial. 2. Mildly angulated oblique fracture of the distal fibula. 3.  Severe osteopenia. Electronically Signed   By: Vanetta Chou M.D.   On: 03/20/2024 20:20   DG Ankle 2 Views Right Result Date: 03/20/2024 CLINICAL DATA:  Distal tibial and fibular fractures. EXAM: RIGHT ANKLE - 2 VIEW COMPARISON:  Earlier radiograph dated 03/20/2024. FINDINGS: Slight interval decrease in the angulation of the distal tibial and fibular fractures compared to the earlier radiograph. There has been interval placement of a cast. IMPRESSION:  Slight interval decrease in the angulation of the distal tibial and fibular fractures. Electronically Signed   By: Vanetta Chou M.D.   On: 03/20/2024 19:35   DG Ankle Complete Right Result Date: 03/20/2024 CLINICAL DATA:  fall. EXAM: RIGHT ANKLE - COMPLETE 3+ VIEW; RIGHT FOOT COMPLETE - 3+ VIEW COMPARISON:  None Available. FINDINGS: There is acute comminuted/angulated and displaced fracture of the distal right tibia. There is slightly angulated fracture of the distal right fibula also. No other acute fracture or dislocation. No aggressive osseous lesion. There is old fracture versus unfused apophysis along the base of the fifth metatarsal. Ankle mortise appears intact. There are mild to moderate diffuse degenerative changes of imaged joints. Calcaneal spur noted along the Achilles tendon attachment site. Mild diffuse soft tissue swelling noted around the ankle joint. No radiopaque foreign bodies. IMPRESSION: Acute fractures of the distal right tibia and fibula, as described above. Electronically Signed   By: Ree Molt M.D.   On: 03/20/2024 16:17   DG Foot Complete Right Result Date: 03/20/2024 CLINICAL DATA:  fall. EXAM: RIGHT ANKLE - COMPLETE 3+ VIEW; RIGHT FOOT COMPLETE - 3+ VIEW COMPARISON:  None Available. FINDINGS: There is acute comminuted/angulated and displaced fracture of the distal right tibia. There is slightly angulated fracture of the distal right fibula also. No other acute fracture or dislocation. No aggressive osseous lesion. There  is old fracture versus unfused apophysis along the base of the fifth metatarsal. Ankle mortise appears intact. There are mild to moderate diffuse degenerative changes of imaged joints. Calcaneal spur noted along the Achilles tendon attachment site. Mild diffuse soft tissue swelling noted around the ankle joint. No radiopaque foreign bodies. IMPRESSION: Acute fractures of the distal right tibia and fibula, as described above. Electronically Signed   By: Ree Molt M.D.   On: 03/20/2024 16:17     The results of significant diagnostics from this hospitalization (including imaging, microbiology, ancillary and laboratory) are listed below for reference.     Microbiology: Recent Results (from the past 240 hours)  Surgical pcr screen     Status: Abnormal   Collection Time: 03/21/24  8:00 AM   Specimen: Nasal Mucosa; Nasal Swab  Result Value Ref Range Status   MRSA, PCR NEGATIVE NEGATIVE Final   Staphylococcus aureus POSITIVE (A) NEGATIVE Final    Comment: (NOTE) The Xpert SA Assay (FDA approved for NASAL specimens in patients 46 years of age and older), is one component of a comprehensive surveillance program. It is not intended to diagnose infection nor to guide or monitor treatment. Performed at [facility], [address] [place], [place] [address]      Labs: BNP (last 3 results) Recent Labs    04/08/23 1843 05/09/23 1608  BNP 626.5* 415.5*   Basic Metabolic Panel: Recent Labs  Lab 03/21/24 0452 03/21/24 1247 03/22/24 0503 03/22/24 2137 03/23/24 0649 03/24/24 0333 03/25/24 0653 03/26/24 0555  NA 141   < > 140 139 139 139 142 139  K 5.5*   < > 5.8* 5.0 4.6 4.7 4.9 4.8  CL 98   < > 97* 95* 95* 96* 96* 96*  CO2 32  --  32 33* 35* 35* 32 35*  GLUCOSE 138*   < > 124* 146* 105* 82 86 88  BUN 16   < > 19 21 22 23  25* 21  CREATININE 1.53*   < > 1.63* 1.66* 1.63* 1.45* 1.47* 1.21*  CALCIUM  8.8*  --  8.8* 8.4* 8.4* 8.4* 8.4*  8.3*  MG 1.7  --  1.7  --  1.8 1.8 1.9 1.9   PHOS 4.7*  --   --   --   --   --   --   --    < > = values in this interval not displayed.   Liver Function Tests: Recent Labs  Lab 03/20/24 1631 03/22/24 2137  AST 16 17  ALT 10 7  ALKPHOS 104 86  BILITOT 0.2 0.3  PROT 6.6 5.9*  ALBUMIN  3.0* 2.6*   No results for input(s): LIPASE, AMYLASE in the last 168 hours. Recent Labs  Lab 03/23/24 1148  AMMONIA 29   CBC: Recent Labs  Lab 03/20/24 1631 03/21/24 0452 03/22/24 2137 03/23/24 0649 03/24/24 0333 03/25/24 0653 03/25/24 2230 03/26/24 0555  WBC 13.1*   < > 9.6 9.0 9.1 8.7  --  8.8  NEUTROABS 10.5*  --  7.4  --   --   --   --   --   HGB 9.1*   < > 7.5* 7.3* 7.3* 7.0* 8.7* 9.2*  HCT 29.4*   < > 23.6* 23.6* 23.4* 22.8* 26.6* 28.6*  MCV 98.7   < > 95.9 97.1 95.9 96.2  --  94.1  PLT 221   < > 173 185 176 214  --  223   < > = values in this interval not displayed.   Cardiac Enzymes: No results for input(s): CKTOTAL, CKMB, CKMBINDEX, TROPONINI in the last 168 hours. BNP: Invalid input(s): POCBNP CBG: Recent Labs  Lab 03/25/24 0812 03/25/24 1207 03/25/24 1608 03/25/24 2109 03/26/24 0614  GLUCAP 75 84 108* 90 74   D-Dimer No results for input(s): DDIMER in the last 72 hours. Hgb A1c Recent Labs    03/23/24 1148  HGBA1C 6.2*   Lipid Profile No results for input(s): CHOL, HDL, LDLCALC, TRIG, CHOLHDL, LDLDIRECT in the last 72 hours. Thyroid  function studies No results for input(s): TSH, T4TOTAL, T3FREE, THYROIDAB in the last 72 hours.  Invalid input(s): FREET3 Anemia work up No results for input(s): VITAMINB12, FOLATE, FERRITIN, TIBC, IRON , RETICCTPCT in the last 72 hours. Urinalysis    Component Value Date/Time   COLORURINE YELLOW 04/08/2023 2057   APPEARANCEUR HAZY (A) 04/08/2023 2057   LABSPEC 1.015 04/08/2023 2057   PHURINE 6.0 04/08/2023 2057   GLUCOSEU NEGATIVE 04/08/2023 2057   GLUCOSEU NEGATIVE 05/26/2009 1007   HGBUR TRACE (A) 04/08/2023  2057   BILIRUBINUR NEGATIVE 04/08/2023 2057   KETONESUR NEGATIVE 04/08/2023 2057   PROTEINUR NEGATIVE 04/08/2023 2057   UROBILINOGEN 0.2 10/16/2013 1735   NITRITE NEGATIVE 04/08/2023 2057   LEUKOCYTESUR NEGATIVE 04/08/2023 2057   Sepsis Labs Recent Labs  Lab 03/23/24 0649 03/24/24 0333 03/25/24 0653 03/26/24 0555  WBC 9.0 9.1 8.7 8.8   Microbiology Recent Results (from the past 240 hours)  Surgical pcr screen     Status: Abnormal   Collection Time: 03/21/24  8:00 AM   Specimen: Nasal Mucosa; Nasal Swab  Result Value Ref Range Status   MRSA, PCR NEGATIVE NEGATIVE Final   Staphylococcus aureus POSITIVE (A) NEGATIVE Final    Comment: (NOTE) The Xpert SA Assay (FDA approved for NASAL specimens in patients 14 years of age and older), is one component of a comprehensive surveillance program. It is not intended to diagnose infection nor to guide or monitor treatment. Performed at [facility], [address], [place], [place] [id number]      Time coordinating discharge:  I have spent 35 minutes face to  face with the patient and on the ward discussing the patients care, assessment, plan and disposition with other care givers. >50% of the time was devoted counseling the patient about the risks and benefits of treatment/Discharge disposition and coordinating care.   SIGNED:   Burgess JAYSON Dare, MD  [facility] 03/26/2024, 10:57 AM   If 7PM-7AM, please contact night-coverage

## 2024-03-26 NOTE — TOC Progression Note (Addendum)
 Transition of Care [facility]) - Progression Note    Patient Details  Name: Beverly Wheeler MRN: [id number] Date of Birth: Apr 26, 1946  [facility]) CM/SW Contact  Bridget Cordella Simmonds, LCSW Phone Number: 03/26/2024, 10:01 AM  Clinical Narrative:   CSW spoke with pt son Zackary by phone, they would like to accept offer at [facility]. CSW spoke with [facility]: they do not have any available beds, no scheduled DC, unsure when next bed will be open.  CSW spoke with Zackary again: he is coming to the hospital before noon, will talk with pt about second choice.  PT note needed for insurance auth.  PT aware.   CSW spoke with [facility] medicare provider line, who confirmed pt insurance is still active, no issues, nothing to indicate it will be cancelled.   1330: CSW spoke with pt and son Christyne in room, they will accept offer at [facility].  CSW message with [facility]: possible they will have bed tomorrow, could be Thursday.  SNF auth request submitted in [place].  Expected Discharge Plan: Skilled Nursing Facility Barriers to Discharge: Continued Medical Work up, SNF Pending bed offer               Expected Discharge Plan and Services In-house Referral: Clinical Social Work   Post Acute Care Choice: Skilled Nursing Facility Living arrangements for the past 2 months: Single Family Home Expected Discharge Date: 03/26/24                                     Social Drivers of Health (SDOH) Interventions SDOH Screenings   Food Insecurity: No Food Insecurity (03/22/2024)  Housing: Low Risk  (03/22/2024)  Transportation Needs: No Transportation Needs (03/22/2024)  Utilities: Not At Risk (03/22/2024)  Alcohol Screen: Low Risk  (04/12/2023)  Depression (PHQ2-9): Low Risk  (09/26/2023)  Financial Resource Strain: Low Risk  (04/12/2023)  Physical Activity: Inactive (05/02/2022)  Social Connections: Socially Isolated (03/22/2024)  Stress: No Stress Concern Present  (05/02/2022)  Tobacco Use: Medium Risk (03/21/2024)    Readmission Risk Interventions     No data to display

## 2024-03-26 NOTE — Progress Notes (Signed)
 Occupational Therapy Treatment Patient Details Name: Beverly Wheeler MRN: [id number] DOB: March 09, 1946 Today's Date: 03/26/2024   History of present illness Beverly Wheeler is a 78 y.o. female admitted 03/20/24 after ground level fall sustaining a right tibial shaft and pilon fracture. Pt s/p ORIF and application of incisional vac 8/28. PMHx: COPD, chronic respiratory failure on 2-3L O2 at baseline, T2DM, HTN, HLD, CKD 3, CAD, and IDA.   OT comments  Pt making incremental progress towards OT goals with improved cognition noted from initial eval. Pt with bowel incontinence in bed prior to OOB attempts, requiring Total A for cleanup. Afterwards, pt able to sit EOB with Mod A. Attempted standing with RW though Max A X 2 needed and unable to gain balance. Opted for scooting to drop arm recliner with +2 assist required. Continue to recommend continued inpatient follow up therapy, <3 hours/day at DC. Son at bedside, discussing SNF choices with pt.   SpO2 variable but 86-89% on 2-3 L O2      If plan is discharge home, recommend the following:  A lot of help with walking and/or transfers;Two people to help with walking and/or transfers;A lot of help with bathing/dressing/bathroom;Two people to help with bathing/dressing/bathroom   Equipment Recommendations  Other (comment) (TBD pending progress)    Recommendations for Other Services      Precautions / Restrictions Precautions Precautions: Fall Recall of Precautions/Restrictions: Impaired Precaution/Restrictions Comments: watch SpO2; Wound Vac. Restrictions Weight Bearing Restrictions Per Provider Order: Yes RLE Weight Bearing Per Provider Order: Non weight bearing       Mobility Bed Mobility Overal bed mobility: Needs Assistance Bed Mobility: Supine to Sit     Supine to sit: Mod assist, HOB elevated, Used rails     General bed mobility comments: cues for bedrail use, good initiation of LE to EOB and assist to lift trunk/scoot  bottom    Transfers Overall transfer level: Needs assistance Equipment used: Rolling walker (2 wheels), None Transfers: Sit to/from Stand, Bed to chair/wheelchair/BSC Sit to Stand: Max assist, +2 physical assistance, +2 safety/equipment          Lateral/Scoot Transfers: Max assist, +2 physical assistance, +2 safety/equipment General transfer comment: Max A x 2 for stand with RW, fair adherence to NWB but unable to gain balance. Max A x 2 for scooting to chair with use of bed pad w/ pt difficulty sequencing     Balance Overall balance assessment: Needs assistance Sitting-balance support: No upper extremity supported, Feet supported Sitting balance-Leahy Scale: Fair Sitting balance - Comments: initially Min A but progressed quickly to CGA Postural control: Posterior lean Standing balance support: Bilateral upper extremity supported, During functional activity Standing balance-Leahy Scale: Poor                             ADL either performed or assessed with clinical judgement   ADL Overall ADL's : Needs assistance/impaired Eating/Feeding: Set up;Sitting                   Lower Body Dressing: Total assistance;Bed level       Toileting- Clothing Manipulation and Hygiene: Total assistance;Bed level Toileting - Clothing Manipulation Details (indicate cue type and reason): Total A for peri care after bowel incontinence            Extremity/Trunk Assessment Upper Extremity Assessment Upper Extremity Assessment: Generalized weakness;Right hand dominant   Lower Extremity Assessment Lower Extremity Assessment: Defer to PT evaluation  Vision   Vision Assessment?: No apparent visual deficits   Perception     Praxis     Communication Communication Communication: Impaired Factors Affecting Communication: Reduced clarity of speech   Cognition Arousal: Alert Behavior During Therapy: WFL for tasks assessed/performed Cognition: Cognition  impaired     Awareness: Intellectual awareness intact, Online awareness impaired Memory impairment (select all impairments): Working memory Attention impairment (select first level of impairment): Selective attention Executive functioning impairment (select all impairments): Reasoning, Problem solving OT - Cognition Comments: improved cognition from eval. able to express needs, multimodal cues for command following. poor recall of WB precautions                 Following commands: Impaired Following commands impaired: Only follows one step commands consistently, Follows one step commands with increased time, Follows multi-step commands inconsistently      Cueing   Cueing Techniques: Verbal cues, Gestural cues, Tactile cues  Exercises      Shoulder Instructions       General Comments      Pertinent Vitals/ Pain       Pain Assessment Pain Assessment: Faces Faces Pain Scale: Hurts little more Pain Location: bottom with peri care Pain Descriptors / Indicators: Grimacing Pain Intervention(s): Limited activity within patient's tolerance, Monitored during session, Other (comment) (applied barrier cream)  Home Living                                          Prior Functioning/Environment              Frequency  Min 1X/week        Progress Toward Goals  OT Goals(current goals can now be found in the care plan section)  Progress towards OT goals: Progressing toward goals  Acute Rehab OT Goals Patient Stated Goal: go to rehab and get better OT Goal Formulation: Patient unable to participate in goal setting Time For Goal Achievement: 04/05/24 Potential to Achieve Goals: Fair ADL Goals Pt Will Perform Eating: with set-up;sitting Pt Will Perform Grooming: with set-up;sitting Pt Will Transfer to Toilet: with +2 assist;with min assist;stand pivot transfer;bedside commode;squat pivot transfer Additional ADL Goal #1: Pt to complete bed mobility with  Min A in prep for EOB/OOB ADLs.  Plan      Co-evaluation    PT/OT/SLP Co-Evaluation/Treatment: Yes Reason for Co-Treatment: For patient/therapist safety;To address functional/ADL transfers PT goals addressed during session: Mobility/safety with mobility;Balance OT goals addressed during session: ADL's and self-care;Proper use of Adaptive equipment and DME      AM-PAC OT 6 Clicks Daily Activity     Outcome Measure   Help from another person eating meals?: A Little Help from another person taking care of personal grooming?: A Lot Help from another person toileting, which includes using toliet, bedpan, or urinal?: Total Help from another person bathing (including washing, rinsing, drying)?: A Lot Help from another person to put on and taking off regular upper body clothing?: A Lot Help from another person to put on and taking off regular lower body clothing?: A Lot 6 Click Score: 12    End of Session Equipment Utilized During Treatment: Gait belt;Oxygen ;Rolling walker (2 wheels)  OT Visit Diagnosis: Unsteadiness on feet (R26.81);Other abnormalities of gait and mobility (R26.89);Muscle weakness (generalized) (M62.81)   Activity Tolerance Patient tolerated treatment well   Patient Left in chair;with call bell/phone within reach;with chair alarm set;with  family/visitor present   Nurse Communication Mobility status        Time: [phone] OT Time Calculation (min): 28 min  Charges: OT General Charges $OT Visit: 1 Visit OT Treatments $Therapeutic Activity: 8-22 mins  Mliss NOVAK, OTR/L Acute Rehab Services Office: [phone]   Mliss Fish 03/26/2024, 1:11 PM

## 2024-03-26 NOTE — Progress Notes (Signed)
 Mobility Specialist Progress Note:   03/26/24 1418  Mobility  Activity Pivoted/transferred from chair to bed  Level of Assistance Maximum assist, patient does 25-49% (+2)  Assistive Device Sliding board  RLE Weight Bearing Per Provider Order NWB  Activity Response Tolerated fair  Mobility Referral Yes  Mobility visit 1 Mobility  Mobility Specialist Start Time (ACUTE ONLY) 1345  Mobility Specialist Stop Time (ACUTE ONLY) 1400  Mobility Specialist Time Calculation (min) (ACUTE ONLY) 15 min   Pt received in chair requesting assistance getting back in bed. Pt c/o buttock pain, otherwise tolerated fair. Pt required MaxA +2 w/ use of sliding board to transfer to bed. Situated in bed w/ call bell and personal belongings in reach. All needs met. Bed alarm on. NT aware.  Thersia Minder Mobility Specialist  Please contact vis Secure Chat or  Rehab Office [phone]

## 2024-03-26 NOTE — Progress Notes (Signed)
 Physical Therapy Treatment Patient Details Name: Beverly Wheeler MRN: [id number] DOB: February 14, 1946 Today's Date: 03/26/2024   History of Present Illness Beverly Wheeler is a 78 y.o. female admitted 03/20/24 after ground level fall sustaining a right tibial shaft and pilon fracture. Pt s/p ORIF and application of incisional vac 8/28. PMHx: COPD, chronic respiratory failure on 2-3L O2 at baseline, T2DM, HTN, HLD, CKD 3, CAD, and IDA.    PT Comments  Pt with improved cognition and ability to follow simple commands today.  She was able to progress bed mobility to mod A.  Able to progress OOB but required max x 2 for lateral scoot.  Pt still with decreased O2 sats with activity but improves with rest and cues for PLB.  Continue POC. Patient will benefit from continued inpatient follow up therapy, <3 hours/day at d/c.     If plan is discharge home, recommend the following: Two people to help with walking and/or transfers;Two people to help with bathing/dressing/bathroom;Assistance with cooking/housework;Assist for transportation;Help with stairs or ramp for entrance;Supervision due to cognitive status   Can travel by private vehicle     No  Equipment Recommendations  Hospital bed;Hoyer lift;Rolling walker (2 wheels)    Recommendations for Other Services       Precautions / Restrictions Precautions Precautions: Fall Precaution/Restrictions Comments: watch SpO2; Wound Vac. Restrictions RLE Weight Bearing Per Provider Order: Non weight bearing     Mobility  Bed Mobility Overal bed mobility: Needs Assistance Bed Mobility: Rolling Rolling: Min assist   Supine to sit: Mod assist, HOB elevated, Used rails     General bed mobility comments: Rolling both sides with cues and min A for ADLs. To sit:  Cues for bedrail use, good initiation of LE to EOB and assist to lift trunk/scoot bottom    Transfers Overall transfer level: Needs assistance Equipment used: Rolling walker (2 wheels),  None Transfers: Sit to/from Stand, Bed to chair/wheelchair/BSC Sit to Stand: Max assist, +2 physical assistance, +2 safety/equipment          Lateral/Scoot Transfers: Max assist, +2 physical assistance, +2 safety/equipment General transfer comment: Max A x 2 for stand with RW, fair adherence to NWB but unable to gain balance in order to pivot. Max A x 2 for scooting to chair with use of bed pad w/ pt difficulty sequencing    Ambulation/Gait               General Gait Details: Unable   Stairs             Wheelchair Mobility     Tilt Bed    Modified Rankin (Stroke Patients Only)       Balance Overall balance assessment: Needs assistance Sitting-balance support: No upper extremity supported, Feet supported Sitting balance-Leahy Scale: Fair Sitting balance - Comments: initially Min A but progressed quickly to CGA Postural control: Posterior lean Standing balance support: Bilateral upper extremity supported, During functional activity Standing balance-Leahy Scale: Poor Standing balance comment: Pt dependent on RW and +2 assist                            Communication Communication Communication: Impaired Factors Affecting Communication: Reduced clarity of speech  Cognition Arousal: Alert Behavior During Therapy: WFL for tasks assessed/performed   PT - Cognitive impairments: Sequencing                         Following commands:  Impaired Following commands impaired: Only follows one step commands consistently, Follows one step commands with increased time, Follows multi-step commands inconsistently    Cueing Cueing Techniques: Verbal cues, Gestural cues, Tactile cues  Exercises General Exercises - Lower Extremity Ankle Circles/Pumps: AROM, Both, 5 reps, Supine Quad Sets: AROM, Both, 5 reps, Supine    General Comments General comments (skin integrity, edema, etc.): Pt on 3 L O2 with sats varying between 86-90% during session.  Cues  for PLB when sats would drop and pt recovered.      Pertinent Vitals/Pain Pain Assessment Pain Assessment: Faces Faces Pain Scale: Hurts little more Pain Location: bottom with peri care; ankle pain controlled Pain Descriptors / Indicators: Grimacing Pain Intervention(s): Limited activity within patient's tolerance, Monitored during session, Repositioned (barrier cream)    Home Living                          Prior Function            PT Goals (current goals can now be found in the care plan section) Progress towards PT goals: Progressing toward goals    Frequency    Min 2X/week      PT Plan      Co-evaluation PT/OT/SLP Co-Evaluation/Treatment: Yes Reason for Co-Treatment: For patient/therapist safety;To address functional/ADL transfers PT goals addressed during session: Mobility/safety with mobility;Balance OT goals addressed during session: ADL's and self-care;Proper use of Adaptive equipment and DME      AM-PAC PT 6 Clicks Mobility   Outcome Measure  Help needed turning from your back to your side while in a flat bed without using bedrails?: A Little Help needed moving from lying on your back to sitting on the side of a flat bed without using bedrails?: A Lot Help needed moving to and from a bed to a chair (including a wheelchair)?: Total Help needed standing up from a chair using your arms (e.g., wheelchair or bedside chair)?: Total Help needed to walk in hospital room?: Total Help needed climbing 3-5 steps with a railing? : Total 6 Click Score: 9    End of Session Equipment Utilized During Treatment: Oxygen ;Gait belt Activity Tolerance: Patient tolerated treatment well Patient left: with call bell/phone within reach;with chair alarm set;in chair;with family/visitor present Nurse Communication: Mobility status PT Visit Diagnosis: Muscle weakness (generalized) (M62.81);Other abnormalities of gait and mobility (R26.89);Unsteadiness on feet  (R26.81);Difficulty in walking, not elsewhere classified (R26.2)     Time: [id number] PT Time Calculation (min) (ACUTE ONLY): 29 min  Charges:    $Therapeutic Activity: 8-22 mins PT General Charges $$ ACUTE PT VISIT: 1 Visit                     Benjiman, PT Acute Rehab Services [facility] [phone]    Benjiman VEAR Mulberry 03/26/2024, 1:32 PM

## 2024-03-26 NOTE — Progress Notes (Signed)
 Orthopedic Note  Patient sleeping comfortably this morning. Incisional vac in place with no evidence of leak. No drainage in canister.   Plan: NWB RLE Keep incisional vac on and then transition to portable home vac which is charging in her room at discharge PT evaluate and treat Xarelto  for DVT ppx Dispo per primary   Ozell DELENA Ada, MD Orthopedic Surgeon

## 2024-03-27 DIAGNOSIS — S82301A Unspecified fracture of lower end of right tibia, initial encounter for closed fracture: Secondary | ICD-10-CM | POA: Diagnosis not present

## 2024-03-27 DIAGNOSIS — J45909 Unspecified asthma, uncomplicated: Secondary | ICD-10-CM | POA: Diagnosis not present

## 2024-03-27 DIAGNOSIS — S82831D Other fracture of upper and lower end of right fibula, subsequent encounter for closed fracture with routine healing: Secondary | ICD-10-CM | POA: Diagnosis not present

## 2024-03-27 DIAGNOSIS — R278 Other lack of coordination: Secondary | ICD-10-CM | POA: Diagnosis not present

## 2024-03-27 DIAGNOSIS — J9611 Chronic respiratory failure with hypoxia: Secondary | ICD-10-CM | POA: Diagnosis not present

## 2024-03-27 DIAGNOSIS — S82201D Unspecified fracture of shaft of right tibia, subsequent encounter for closed fracture with routine healing: Secondary | ICD-10-CM | POA: Diagnosis not present

## 2024-03-27 DIAGNOSIS — R638 Other symptoms and signs concerning food and fluid intake: Secondary | ICD-10-CM | POA: Diagnosis not present

## 2024-03-27 DIAGNOSIS — W1830XD Fall on same level, unspecified, subsequent encounter: Secondary | ICD-10-CM | POA: Diagnosis not present

## 2024-03-27 DIAGNOSIS — J449 Chronic obstructive pulmonary disease, unspecified: Secondary | ICD-10-CM | POA: Diagnosis not present

## 2024-03-27 DIAGNOSIS — D509 Iron deficiency anemia, unspecified: Secondary | ICD-10-CM | POA: Diagnosis not present

## 2024-03-27 DIAGNOSIS — G9340 Encephalopathy, unspecified: Secondary | ICD-10-CM | POA: Diagnosis not present

## 2024-03-27 DIAGNOSIS — R6889 Other general symptoms and signs: Secondary | ICD-10-CM | POA: Diagnosis not present

## 2024-03-27 DIAGNOSIS — L22 Diaper dermatitis: Secondary | ICD-10-CM | POA: Diagnosis not present

## 2024-03-27 DIAGNOSIS — K219 Gastro-esophageal reflux disease without esophagitis: Secondary | ICD-10-CM | POA: Diagnosis not present

## 2024-03-27 DIAGNOSIS — Z743 Need for continuous supervision: Secondary | ICD-10-CM | POA: Diagnosis not present

## 2024-03-27 DIAGNOSIS — E7220 Disorder of urea cycle metabolism, unspecified: Secondary | ICD-10-CM | POA: Diagnosis not present

## 2024-03-27 DIAGNOSIS — E876 Hypokalemia: Secondary | ICD-10-CM | POA: Diagnosis not present

## 2024-03-27 DIAGNOSIS — I2510 Atherosclerotic heart disease of native coronary artery without angina pectoris: Secondary | ICD-10-CM | POA: Diagnosis not present

## 2024-03-27 DIAGNOSIS — E782 Mixed hyperlipidemia: Secondary | ICD-10-CM | POA: Diagnosis not present

## 2024-03-27 DIAGNOSIS — S82301D Unspecified fracture of lower end of right tibia, subsequent encounter for closed fracture with routine healing: Secondary | ICD-10-CM | POA: Diagnosis not present

## 2024-03-27 DIAGNOSIS — M6281 Muscle weakness (generalized): Secondary | ICD-10-CM | POA: Diagnosis not present

## 2024-03-27 DIAGNOSIS — I959 Hypotension, unspecified: Secondary | ICD-10-CM | POA: Diagnosis not present

## 2024-03-27 DIAGNOSIS — J309 Allergic rhinitis, unspecified: Secondary | ICD-10-CM | POA: Diagnosis not present

## 2024-03-27 DIAGNOSIS — J441 Chronic obstructive pulmonary disease with (acute) exacerbation: Secondary | ICD-10-CM | POA: Diagnosis not present

## 2024-03-27 DIAGNOSIS — N1831 Chronic kidney disease, stage 3a: Secondary | ICD-10-CM | POA: Diagnosis not present

## 2024-03-27 DIAGNOSIS — I5040 Unspecified combined systolic (congestive) and diastolic (congestive) heart failure: Secondary | ICD-10-CM | POA: Diagnosis not present

## 2024-03-27 DIAGNOSIS — I10 Essential (primary) hypertension: Secondary | ICD-10-CM | POA: Diagnosis not present

## 2024-03-27 DIAGNOSIS — R2689 Other abnormalities of gait and mobility: Secondary | ICD-10-CM | POA: Diagnosis not present

## 2024-03-27 DIAGNOSIS — M8580 Other specified disorders of bone density and structure, unspecified site: Secondary | ICD-10-CM | POA: Diagnosis not present

## 2024-03-27 DIAGNOSIS — S82891A Other fracture of right lower leg, initial encounter for closed fracture: Secondary | ICD-10-CM | POA: Diagnosis not present

## 2024-03-27 DIAGNOSIS — E119 Type 2 diabetes mellitus without complications: Secondary | ICD-10-CM | POA: Diagnosis not present

## 2024-03-27 DIAGNOSIS — E785 Hyperlipidemia, unspecified: Secondary | ICD-10-CM | POA: Diagnosis not present

## 2024-03-27 DIAGNOSIS — I509 Heart failure, unspecified: Secondary | ICD-10-CM | POA: Diagnosis not present

## 2024-03-27 DIAGNOSIS — J9690 Respiratory failure, unspecified, unspecified whether with hypoxia or hypercapnia: Secondary | ICD-10-CM | POA: Diagnosis not present

## 2024-03-27 LAB — BASIC METABOLIC PANEL WITH GFR
Anion gap: 8 (ref 5–15)
BUN: 21 mg/dL (ref 8–23)
CO2: 34 mmol/L — ABNORMAL HIGH (ref 22–32)
Calcium: 8.3 mg/dL — ABNORMAL LOW (ref 8.9–10.3)
Chloride: 96 mmol/L — ABNORMAL LOW (ref 98–111)
Creatinine, Ser: 1.16 mg/dL — ABNORMAL HIGH (ref 0.44–1.00)
GFR, Estimated: 49 mL/min — ABNORMAL LOW (ref >60)
Glucose, Bld: 84 mg/dL (ref 70–99)
Potassium: 4.7 mmol/L (ref 3.5–5.1)
Sodium: 138 mmol/L (ref 135–145)

## 2024-03-27 LAB — CBC
HCT: 28.7 % — ABNORMAL LOW (ref 36.0–46.0)
Hemoglobin: 9 g/dL — ABNORMAL LOW (ref 12.0–15.0)
MCH: 29.7 pg (ref 26.0–34.0)
MCHC: 31.4 g/dL (ref 30.0–36.0)
MCV: 94.7 fL (ref 80.0–100.0)
Platelets: 236 K/uL (ref 150–400)
RBC: 3.03 MIL/uL — ABNORMAL LOW (ref 3.87–5.11)
RDW: 13.4 % (ref 11.5–15.5)
WBC: 8.6 K/uL (ref 4.0–10.5)
nRBC: 0.2 % (ref 0.0–0.2)

## 2024-03-27 LAB — MAGNESIUM: Magnesium: 1.8 mg/dL (ref 1.7–2.4)

## 2024-03-27 LAB — GLUCOSE, CAPILLARY
Glucose-Capillary: 92 mg/dL (ref 70–99)
Glucose-Capillary: 94 mg/dL (ref 70–99)

## 2024-03-27 MED ORDER — TRAMADOL HCL 50 MG PO TABS
50.0000 mg | ORAL_TABLET | Freq: Three times a day (TID) | ORAL | Status: DC | PRN
  Administered 2024-03-27: 50 mg via ORAL
  Filled 2024-03-27: qty 1

## 2024-03-27 MED ORDER — FAMOTIDINE 20 MG PO TABS: 20.0000 mg | ORAL_TABLET | Freq: Every day | ORAL | Status: AC | PRN

## 2024-03-27 MED ORDER — TRAMADOL HCL 50 MG PO TABS: 50.0000 mg | ORAL_TABLET | Freq: Three times a day (TID) | ORAL | 0 refills | Status: AC | PRN

## 2024-03-27 NOTE — TOC Transition Note (Signed)
 Transition of Care [facility]) - Discharge Note   Patient Details  Name: Beverly Wheeler MRN: [id number] Date of Birth: Nov 25, 1945  Transition [facility]) CM/SW Contact:  Bridget Cordella Simmonds, LCSW Phone Number: 03/27/2024, 11:07 AM   Clinical Narrative: Pt discharging to [facility], room 601B.  RN call report to [phone].  PTAR called [phone].        Final next level of care: Skilled Nursing Facility Barriers to Discharge: Barriers Resolved   Patient Goals and CMS Choice Patient states their goals for this hospitalization and ongoing recovery are:: going out, walking CMS Medicare.gov Compare Post Acute Care list provided to:: Patient Choice offered to / list presented to : Patient      Discharge Placement              Patient chooses bed at: [facility] Patient to be transferred to facility by: ptar Name of family member notified: son Cleatrice Patient and family notified of of transfer: 03/27/24  Discharge Plan and Services Additional resources added to the After Visit Summary for   In-house Referral: Clinical Social Work   Post Acute Care Choice: Skilled Nursing Facility                               Social Drivers of Health (SDOH) Interventions SDOH Screenings   Food Insecurity: No Food Insecurity (03/22/2024)  Housing: Low Risk  (03/22/2024)  Transportation Needs: No Transportation Needs (03/22/2024)  Utilities: Not At Risk (03/22/2024)  Alcohol Screen: Low Risk  (04/12/2023)  Depression (PHQ2-9): Low Risk  (09/26/2023)  Financial Resource Strain: Low Risk  (04/12/2023)  Physical Activity: Inactive (05/02/2022)  Social Connections: Socially Isolated (03/22/2024)  Stress: No Stress Concern Present (05/02/2022)  Tobacco Use: Medium Risk (03/21/2024)     Readmission Risk Interventions     No data to display

## 2024-03-27 NOTE — Progress Notes (Signed)
 PROGRESS NOTE Beverly Wheeler  FMW:[id number] DOB: 10-20-1945 DOA: 03/20/2024 PCP: [place], Betty G, MD  Brief Narrative/Hospital Course: Beverly Wheeler is a 78 y.o. year old female with PMH of chronic hypoxic respiratory failure 2 L nasal cannula, COPD, CAD, CHF with reduced EF, CKD 3A, HTN, HLD, DM 2, iron  deficiency anemia presented with ground-level fall causing right distal tibial/fibular fracture.  S/p ORIF on 8/28. postop course complicated by concerns of weakness and confusion therefore CT head performed which was unremarkable and was also seen by neurology service. PT/OT is recommending SNF, and has been stable awaiting for placement.  Admitted day: 03/20/2024 Discharge date 03/27/2024  Subjective: Seen and examined today No new complaints,pain controlled. Overnight afebrile BP stable Labs reviewed this morning overall unchanged  Discharge diagnosis:  Closed R distal tibia / fibular fracture 2/2 fall: S/p ORIF by ortho on 8/28.  Continue multimodal pain management, Xarelto  for DVT prophylaxis, at this time waiting for placement to SNF   Progressive weakness and confusion CT head no acute finding, seen by neurology service, overall stable    Leukocytosis  Resolved    COPD Chronic hypoxic respiratory failure Not in exacerbation continue home meds.  Continue supplemental oxygen   CAD, hx of prior MI Hx of multivessel CAD and referred for CABG in '15 but declined. Continue home statin, zetia , aspirin   HFrEF  Last TTE 9/'24 with LVEF 25-30%, global hypokinesis with local WM abnormalities, Gr 2 DD, biatrial dilation, moderate MR, mild AS. Temporarily pause lasix  (takes three times per week) and Farxiga  preop. Hold Losartan  preop, resume after. Continue home Spironolactone , Bystolic .  Follow-up outpatient [facility] cardiology.  Due for outptn follow up and Repeat Echo.   CKD stage IIIa HyperKalemia-resolved Baseline Cr near 1.2 - 1.4. On admission is 1.5, continue to monitor.  Stable.  HTN Well-controlled cont gdmt  HLD Cont statins  DM type 2 Cont ssi  IDA Hb near baseline ~ 9. Borderline macrocytic. R B12 and folate are normal Borderline low ferritin and iron  saturation Postoperatively hemoglobin drifted down to 7.0.  Received 1 dose of IV iron  and 1 unit of PRBC.  Hemoglobin is now stable.  Mood d/o Continue home lexapro    Vitamin D  deficiency Weekly supplements added  Class I Obesity w/ Body mass index is 33.32 kg/m.: Will benefit with PCP follow-up, weight loss,healthy lifestyle and outpatient sleep eval if not done.  Mobility: PT Orders: Active  PT Follow up Rec: Skilled Nursing-Short Term Rehab (<3 Hours/Day)03/26/2024 1330   DVT prophylaxis: rivaroxaban  (XARELTO ) tablet 10 mg Start: 03/22/24 1000 SCDs Start: 03/20/24 2104 Code Status:   Code Status: Full Code Family Communication: plan of care discussed with patient at bedside. Patient status is: Remains hospitalized because of severity of illness Level of care: Med-Surg   Dispo: The patient is from: home            Anticipated disposition: SNF today Objective: Vitals last 24 hrs: Vitals:   03/26/24 1621 03/26/24 2010 03/27/24 0520 03/27/24 0733  BP: (!) 157/55 (!) 143/44 (!) 122/45 (!) 128/52  Pulse: (!) 57 65 (!) 57 60  Resp: 16 18 18 16   Temp: 98.7 F (37.1 C) 98.4 F (36.9 C) 98 F (36.7 C) (!) 97.5 F (36.4 C)  TempSrc: Oral   Oral  SpO2: 97% 93% 98% 98%  Weight:      Height:        Physical Examination: General exam: alert awake, oriented, older than stated age HEENT:Oral mucosa moist, Ear/Nose WNL grossly  Respiratory system: Bilaterally clear BS,no use of accessory muscle Cardiovascular system: S1 & S2 +, No JVD. Gastrointestinal system: Abdomen soft,NT,ND, BS+ Nervous System: Alert, awake, moving all extremities,and following commands. Extremities: LE edema neg, distal extremities warm.  Right ankle with dressing/wound VAC Skin: No rashes,no icterus. MSK:  Normal muscle bulk,tone, power   Data Reviewed: I have personally reviewed following labs and imaging studies ( see epic result tab) CBC: Recent Labs  Lab 03/20/24 1631 03/21/24 0452 03/22/24 2137 03/23/24 0649 03/24/24 0333 03/25/24 0653 03/25/24 2230 03/26/24 0555 03/27/24 0338  WBC 13.1*   < > 9.6 9.0 9.1 8.7  --  8.8 8.6  NEUTROABS 10.5*  --  7.4  --   --   --   --   --   --   HGB 9.1*   < > 7.5* 7.3* 7.3* 7.0* 8.7* 9.2* 9.0*  HCT 29.4*   < > 23.6* 23.6* 23.4* 22.8* 26.6* 28.6* 28.7*  MCV 98.7   < > 95.9 97.1 95.9 96.2  --  94.1 94.7  PLT 221   < > 173 185 176 214  --  223 236   < > = values in this interval not displayed.   CMP: Recent Labs  Lab 03/21/24 0452 03/21/24 1247 03/23/24 0649 03/24/24 0333 03/25/24 0653 03/26/24 0555 03/27/24 0338  NA 141   < > 139 139 142 139 138  K 5.5*   < > 4.6 4.7 4.9 4.8 4.7  CL 98   < > 95* 96* 96* 96* 96*  CO2 32   < > 35* 35* 32 35* 34*  GLUCOSE 138*   < > 105* 82 86 88 84  BUN 16   < > 22 23 25* 21 21  CREATININE 1.53*   < > 1.63* 1.45* 1.47* 1.21* 1.16*  CALCIUM  8.8*   < > 8.4* 8.4* 8.4* 8.3* 8.3*  MG 1.7   < > 1.8 1.8 1.9 1.9 1.8  PHOS 4.7*  --   --   --   --   --   --    < > = values in this interval not displayed.   GFR: Estimated Creatinine Clearance: 40.2 mL/min (A) (by C-G formula based on SCr of 1.16 mg/dL (H)). Recent Labs  Lab 03/20/24 1631 03/22/24 2137  AST 16 17  ALT 10 7  ALKPHOS 104 86  BILITOT 0.2 0.3  PROT 6.6 5.9*  ALBUMIN  3.0* 2.6*   No results for input(s): LIPASE, AMYLASE in the last 168 hours.  Recent Labs  Lab 03/23/24 1148  AMMONIA 29   Coagulation Profile: No results for input(s): INR, PROTIME in the last 168 hours. Unresulted Labs (From admission, onward)     Start     Ordered   03/22/24 2030  Urinalysis, Complete w Microscopic -Urine, Clean Catch  ONCE - URGENT,   URGENT       Question:  Specimen Source  Answer:  Urine, Clean Catch   03/22/24 2032   03/22/24 0500  Basic  metabolic panel with GFR  Daily,   R     Question:  Specimen collection method  Answer:  Lab=Lab collect   03/21/24 0843   03/22/24 0500  CBC  Daily,   R     Question:  Specimen collection method  Answer:  Lab=Lab collect   03/21/24 0843   03/22/24 0500  Magnesium   Daily,   R     Question:  Specimen collection method  Answer:  Lab=Lab collect  03/21/24 0843           Antimicrobials/Microbiology: Anti-infectives (From admission, onward)    Start     Dose/Rate Route Frequency Ordered Stop   03/24/24 2200  ceFAZolin  (ANCEF ) IVPB 2g/100 mL premix        2 g 200 mL/hr over 30 Minutes Intravenous Every 8 hours 03/24/24 1820 03/25/24 1439   03/22/24 1000  ceFAZolin  (ANCEF ) IVPB 2g/100 mL premix        2 g 200 mL/hr over 30 Minutes Intravenous Every 12 hours 03/22/24 0720 03/22/24 2147   03/21/24 1621  vancomycin  (VANCOCIN ) powder  Status:  Discontinued          As needed 03/21/24 1621 03/21/24 1705   03/21/24 0600  ceFAZolin  (ANCEF ) IVPB 2g/100 mL premix        2 g 200 mL/hr over 30 Minutes Intravenous On call to O.R. 03/20/24 2216 03/21/24 1405         Component Value Date/Time   SDES URINE, CLEAN CATCH 01/14/2022 1724   SPECREQUEST  01/14/2022 1724    NONE Performed at [facility], [address], [place], [place] [address]    CULT >=100,000 COLONIES/mL ESCHERICHIA COLI (A) 01/14/2022 1724   REPTSTATUS 01/16/2022 FINAL 01/14/2022 1724    Procedures: Procedure(s) (LRB): OPEN REDUCTION INTERNAL FIXATION (ORIF) ANKLE FRACTURE (Right)   Mennie LAMY, MD [facility] 03/27/2024, 10:58 AM

## 2024-03-27 NOTE — Discharge Summary (Deleted)
 PROGRESS NOTE Beverly Wheeler  FMW:[id number] DOB: Oct 29, 1945 DOA: 03/20/2024 PCP: [place], Betty G, MD  Brief Narrative/Hospital Course: Beverly Wheeler is a 78 y.o. year old female with PMH of chronic hypoxic respiratory failure 2 L nasal cannula, COPD, CAD, CHF with reduced EF, CKD 3A, HTN, HLD, DM 2, iron  deficiency anemia presented with ground-level fall causing right distal tibial/fibular fracture.  S/p ORIF on 8/28. postop course complicated by concerns of weakness and confusion therefore CT head performed which was unremarkable and was also seen by neurology service. PT/OT is recommending SNF, and has been stable awaiting for placement.  Admitted day: 03/20/2024 Discharge date 03/27/2024  Subjective: Seen and examined today No new complaints,pain controlled. Overnight afebrile BP stable Labs reviewed this morning overall unchanged  Discharge diagnosis:  Closed R distal tibia / fibular fracture 2/2 fall: S/p ORIF by ortho on 8/28.  Continue multimodal pain management, Xarelto  for DVT prophylaxis, at this time waiting for placement to SNF   Progressive weakness and confusion CT head no acute finding, seen by neurology service, overall stable    Leukocytosis  Resolved    COPD Chronic hypoxic respiratory failure Not in exacerbation continue home meds.  Continue supplemental oxygen   CAD, hx of prior MI Hx of multivessel CAD and referred for CABG in '15 but declined. Continue home statin, zetia , aspirin   HFrEF  Last TTE 9/'24 with LVEF 25-30%, global hypokinesis with local WM abnormalities, Gr 2 DD, biatrial dilation, moderate MR, mild AS. Temporarily pause lasix  (takes three times per week) and Farxiga  preop. Hold Losartan  preop, resume after. Continue home Spironolactone , Bystolic .  Follow-up outpatient [facility] cardiology.  Due for outptn follow up and Repeat Echo.   CKD stage IIIa HyperKalemia-resolved Baseline Cr near 1.2 - 1.4. On admission is 1.5, continue to monitor.  Stable.  HTN Well-controlled cont gdmt  HLD Cont statins  DM type 2 Cont ssi  IDA Hb near baseline ~ 9. Borderline macrocytic. R B12 and folate are normal Borderline low ferritin and iron  saturation Postoperatively hemoglobin drifted down to 7.0.  Received 1 dose of IV iron  and 1 unit of PRBC.  Hemoglobin is now stable.  Mood d/o Continue home lexapro    Vitamin D  deficiency Weekly supplements added  Class I Obesity w/ Body mass index is 33.32 kg/m.: Will benefit with PCP follow-up, weight loss,healthy lifestyle and outpatient sleep eval if not done.  Mobility: PT Orders: Active  PT Follow up Rec: Skilled Nursing-Short Term Rehab (<3 Hours/Day)03/26/2024 1330   DVT prophylaxis: rivaroxaban  (XARELTO ) tablet 10 mg Start: 03/22/24 1000 SCDs Start: 03/20/24 2104 Code Status:   Code Status: Full Code Family Communication: plan of care discussed with patient at bedside. Patient status is: Remains hospitalized because of severity of illness Level of care: Med-Surg   Dispo: The patient is from: home            Anticipated disposition: SNF today Objective: Vitals last 24 hrs: Vitals:   03/26/24 1621 03/26/24 2010 03/27/24 0520 03/27/24 0733  BP: (!) 157/55 (!) 143/44 (!) 122/45 (!) 128/52  Pulse: (!) 57 65 (!) 57 60  Resp: 16 18 18 16   Temp: 98.7 F (37.1 C) 98.4 F (36.9 C) 98 F (36.7 C) (!) 97.5 F (36.4 C)  TempSrc: Oral   Oral  SpO2: 97% 93% 98% 98%  Weight:      Height:        Physical Examination: General exam: alert awake, oriented, older than stated age HEENT:Oral mucosa moist, Ear/Nose WNL grossly  Respiratory system: Bilaterally clear BS,no use of accessory muscle Cardiovascular system: S1 & S2 +, No JVD. Gastrointestinal system: Abdomen soft,NT,ND, BS+ Nervous System: Alert, awake, moving all extremities,and following commands. Extremities: LE edema neg, distal extremities warm.  Right ankle with dressing/wound VAC Skin: No rashes,no icterus. MSK:  Normal muscle bulk,tone, power   Data Reviewed: I have personally reviewed following labs and imaging studies ( see epic result tab) CBC: Recent Labs  Lab 03/20/24 1631 03/21/24 0452 03/22/24 2137 03/23/24 0649 03/24/24 0333 03/25/24 0653 03/25/24 2230 03/26/24 0555 03/27/24 0338  WBC 13.1*   < > 9.6 9.0 9.1 8.7  --  8.8 8.6  NEUTROABS 10.5*  --  7.4  --   --   --   --   --   --   HGB 9.1*   < > 7.5* 7.3* 7.3* 7.0* 8.7* 9.2* 9.0*  HCT 29.4*   < > 23.6* 23.6* 23.4* 22.8* 26.6* 28.6* 28.7*  MCV 98.7   < > 95.9 97.1 95.9 96.2  --  94.1 94.7  PLT 221   < > 173 185 176 214  --  223 236   < > = values in this interval not displayed.   CMP: Recent Labs  Lab 03/21/24 0452 03/21/24 1247 03/23/24 0649 03/24/24 0333 03/25/24 0653 03/26/24 0555 03/27/24 0338  NA 141   < > 139 139 142 139 138  K 5.5*   < > 4.6 4.7 4.9 4.8 4.7  CL 98   < > 95* 96* 96* 96* 96*  CO2 32   < > 35* 35* 32 35* 34*  GLUCOSE 138*   < > 105* 82 86 88 84  BUN 16   < > 22 23 25* 21 21  CREATININE 1.53*   < > 1.63* 1.45* 1.47* 1.21* 1.16*  CALCIUM  8.8*   < > 8.4* 8.4* 8.4* 8.3* 8.3*  MG 1.7   < > 1.8 1.8 1.9 1.9 1.8  PHOS 4.7*  --   --   --   --   --   --    < > = values in this interval not displayed.   GFR: Estimated Creatinine Clearance: 40.2 mL/min (A) (by C-G formula based on SCr of 1.16 mg/dL (H)). Recent Labs  Lab 03/20/24 1631 03/22/24 2137  AST 16 17  ALT 10 7  ALKPHOS 104 86  BILITOT 0.2 0.3  PROT 6.6 5.9*  ALBUMIN  3.0* 2.6*   No results for input(s): LIPASE, AMYLASE in the last 168 hours.  Recent Labs  Lab 03/23/24 1148  AMMONIA 29   Coagulation Profile: No results for input(s): INR, PROTIME in the last 168 hours. Unresulted Labs (From admission, onward)     Start     Ordered   03/22/24 2030  Urinalysis, Complete w Microscopic -Urine, Clean Catch  ONCE - URGENT,   URGENT       Question:  Specimen Source  Answer:  Urine, Clean Catch   03/22/24 2032   03/22/24 0500  Basic  metabolic panel with GFR  Daily,   R     Question:  Specimen collection method  Answer:  Lab=Lab collect   03/21/24 0843   03/22/24 0500  CBC  Daily,   R     Question:  Specimen collection method  Answer:  Lab=Lab collect   03/21/24 0843   03/22/24 0500  Magnesium   Daily,   R     Question:  Specimen collection method  Answer:  Lab=Lab collect  03/21/24 0843           Antimicrobials/Microbiology: Anti-infectives (From admission, onward)    Start     Dose/Rate Route Frequency Ordered Stop   03/24/24 2200  ceFAZolin  (ANCEF ) IVPB 2g/100 mL premix        2 g 200 mL/hr over 30 Minutes Intravenous Every 8 hours 03/24/24 1820 03/25/24 1439   03/22/24 1000  ceFAZolin  (ANCEF ) IVPB 2g/100 mL premix        2 g 200 mL/hr over 30 Minutes Intravenous Every 12 hours 03/22/24 0720 03/22/24 2147   03/21/24 1621  vancomycin  (VANCOCIN ) powder  Status:  Discontinued          As needed 03/21/24 1621 03/21/24 1705   03/21/24 0600  ceFAZolin  (ANCEF ) IVPB 2g/100 mL premix        2 g 200 mL/hr over 30 Minutes Intravenous On call to O.R. 03/20/24 2216 03/21/24 1405         Component Value Date/Time   SDES URINE, CLEAN CATCH 01/14/2022 1724   SPECREQUEST  01/14/2022 1724    NONE Performed at [facility], [address] [place], [place] [address]    CULT >=100,000 COLONIES/mL ESCHERICHIA COLI (A) 01/14/2022 1724   REPTSTATUS 01/16/2022 FINAL 01/14/2022 1724    Procedures: Procedure(s) (LRB): OPEN REDUCTION INTERNAL FIXATION (ORIF) ANKLE FRACTURE (Right)   Mennie LAMY, MD [facility] 03/27/2024, 10:23 AM

## 2024-03-27 NOTE — TOC Progression Note (Addendum)
 Transition of Care [facility]) - Progression Note    Patient Details  Name: Beverly Wheeler MRN: [id number] Date of Birth: Mar 06, 1946  Transition of [facility] CM/SW Contact  Bridget Cordella Simmonds, LCSW Phone Number: 03/27/2024, 8:42 AM  Clinical Narrative:   SNF auth approved: [id number], 3 days: 9/3-9/5.  CSW confirmed with [facility] that they do have available bed, are able to receive pt today. MD informed.   Expected Discharge Plan: Skilled Nursing Facility Barriers to Discharge: Continued Medical Work up, SNF Pending bed offer               Expected Discharge Plan and Services In-house Referral: Clinical Social Work   Post Acute Care Choice: Skilled Nursing Facility Living arrangements for the past 2 months: Single Family Home Expected Discharge Date: 03/26/24                                     Social Drivers of Health (SDOH) Interventions SDOH Screenings   Food Insecurity: No Food Insecurity (03/22/2024)  Housing: Low Risk  (03/22/2024)  Transportation Needs: No Transportation Needs (03/22/2024)  Utilities: Not At Risk (03/22/2024)  Alcohol Screen: Low Risk  (04/12/2023)  Depression (PHQ2-9): Low Risk  (09/26/2023)  Financial Resource Strain: Low Risk  (04/12/2023)  Physical Activity: Inactive (05/02/2022)  Social Connections: Socially Isolated (03/22/2024)  Stress: No Stress Concern Present (05/02/2022)  Tobacco Use: Medium Risk (03/21/2024)    Readmission Risk Interventions     No data to display

## 2024-03-27 NOTE — Plan of Care (Signed)

## 2024-03-27 NOTE — Progress Notes (Signed)
 Report called to Laray Homans, RN @ [facility] no questions at this time. Dr. Mennie wrote an order for tramadol  and d/c oxycodone 

## 2024-03-28 ENCOUNTER — Encounter (HOSPITAL_BASED_OUTPATIENT_CLINIC_OR_DEPARTMENT_OTHER): Payer: Self-pay

## 2024-03-28 DIAGNOSIS — D509 Iron deficiency anemia, unspecified: Secondary | ICD-10-CM | POA: Diagnosis not present

## 2024-03-28 DIAGNOSIS — L22 Diaper dermatitis: Secondary | ICD-10-CM | POA: Diagnosis not present

## 2024-03-28 DIAGNOSIS — J9690 Respiratory failure, unspecified, unspecified whether with hypoxia or hypercapnia: Secondary | ICD-10-CM | POA: Diagnosis not present

## 2024-03-28 DIAGNOSIS — I10 Essential (primary) hypertension: Secondary | ICD-10-CM | POA: Diagnosis not present

## 2024-03-28 DIAGNOSIS — E119 Type 2 diabetes mellitus without complications: Secondary | ICD-10-CM | POA: Diagnosis not present

## 2024-03-28 DIAGNOSIS — I509 Heart failure, unspecified: Secondary | ICD-10-CM | POA: Diagnosis not present

## 2024-03-28 DIAGNOSIS — S82201D Unspecified fracture of shaft of right tibia, subsequent encounter for closed fracture with routine healing: Secondary | ICD-10-CM | POA: Diagnosis not present

## 2024-03-28 DIAGNOSIS — J449 Chronic obstructive pulmonary disease, unspecified: Secondary | ICD-10-CM | POA: Diagnosis not present

## 2024-03-28 NOTE — Telephone Encounter (Signed)
 Late entry-patient was actually admitted to hospital 8/27  Has follow up with Dr Lonni 9/5

## 2024-03-29 ENCOUNTER — Ambulatory Visit (HOSPITAL_BASED_OUTPATIENT_CLINIC_OR_DEPARTMENT_OTHER): Admitting: Cardiology

## 2024-03-29 NOTE — Progress Notes (Incomplete)
 Cardiology Office Note:  .   Date:  03/29/2024  ID:  Beverly Wheeler, DOB 1946-02-11, MRN [id number] PCP: [place], Betty G, MD  [facility] Providers Cardiologist:  Shelda Bruckner, MD {  History of Present Illness: Beverly   Beverly Wheeler is a 78 y.o. female with complex PMH, including CAD recommended for CABG in 2015, chronic systolic and diastolic heart failure, chronic hypoxic respiratory failure on home O2. I met her during her hospitalization 03/2023.   Pertinent CV history: Presented with late STEMI in 2015. Found to have multivessel CAD, recommended for CABG, but she declined. Initial EF was 30-35%, then improved to 55-60% in 2015, then down to 35-40% in 2019. EF during admission 03/2023 dropped to 25-30%.  Today: Overall feels stable. Notes that she has been struggling as many of her medications are more expensive this year, especially Jardiance . Brings log of home BP and weights, reviewed. Weights stable between 175-180 lbs for the last several months. Taking lasix  about three times/week, keeps a good balance on her fluid without dropping her blood pressures.   She has used home O2 since about 2019 but hasn't been seen since 2015 (Dr. Darlean) from what I can see from the chart. Discussed re-referral to pulmonary given her persistent need for home O2, she will consider. Breathing is short chronically, very limited reserve for physical activity.  On med review, does not appear that atorvastatin  or ezetimibe  has been dispensed by pharmacy. Patient is uncertain as to whether she has been taking this. Due for lipid recheck, but if she has not been taking, we discussed delaying her labs.  ROS: Denies chest pain. No PND, orthopnea, worsening LE edema or unexpected weight gain. No syncope or palpitations. ROS otherwise negative except as noted.   Studies Reviewed: Beverly    EKG:       Physical Exam:   VS:  There were no vitals taken for this visit.   Wt Readings from Last 3  Encounters:  03/21/24 176 lb 5.9 oz (80 kg)  11/06/23 179 lb 14.4 oz (81.6 kg)  09/26/23 176 lb (79.8 kg)    GEN: On O2 by nasal cannula HEENT: Normal, moist mucous membranes NECK: No JVD CARDIAC: distant but regular rhythm, normal S1 and S2, no rubs or gallops. No murmur. VASCULAR: Radial and DP pulses 2+ bilaterally. No carotid bruits RESPIRATORY:  Distant breath sounds but largely clear to auscultation without rales, wheezing or rhonchi  ABDOMEN: Soft, non-tender, non-distended MUSCULOSKELETAL:  Ambulates independently with rollator SKIN: Warm and dry, trivial firm bilateral LE edema NEUROLOGIC:  Alert and oriented x 3. No focal neuro deficits noted. PSYCHIATRIC:  Normal affect    ASSESSMENT AND PLAN: .    CAD with MI in 2015 Chronic systolic and diastolic heart failure Ischemic cardiomyopathy Hypercholesterolemia -recommended for CABG in 2015, cath has been extensively reviewed previously and not recommended for PCI -she declined CABG previously, managed medically. However, appears she has not had statin or zetia  dispensed from her pharmacy in months. She will confirm this on her home meds. Stressed importance of statin/zetia  for managing her LDL -continue aspirin , denies bleeding -she has not been able to afford Jardiance  in 2025. With dapagliflozin  now generic, will see if this is more affordable for her. She will contact me if cost prohibitive.  -blood pressure has historically limited GDMT. On losartan  12/5 mg daily, bystolic  5 mg daily, spironolactone  12.5 mg daily -weights, symptoms stable on furosemide  40 mg three times a week -check labs,  echo in 3 mos prior to next visit   Chronic hypoxemic respiratory failure, on home O2 COPD -followed with Dr. Darlean in the past, no recent appt that I can see. Discussed re-referral to pulmonology given persistent use of home O2. She will consider.  Dispo: 3 mos or sooner as needed  Signed, Shelda Bruckner, MD   Shelda Bruckner, MD, PhD, [facility]  [facility]  [place]  [facility] & V[facility] at [facility] at [facility] [address] [place], [place] [address] [phone]

## 2024-04-03 ENCOUNTER — Other Ambulatory Visit: Payer: Self-pay | Admitting: Family Medicine

## 2024-04-03 DIAGNOSIS — F419 Anxiety disorder, unspecified: Secondary | ICD-10-CM

## 2024-04-04 DIAGNOSIS — L22 Diaper dermatitis: Secondary | ICD-10-CM | POA: Diagnosis not present

## 2024-04-04 DIAGNOSIS — D509 Iron deficiency anemia, unspecified: Secondary | ICD-10-CM | POA: Diagnosis not present

## 2024-04-04 DIAGNOSIS — E119 Type 2 diabetes mellitus without complications: Secondary | ICD-10-CM | POA: Diagnosis not present

## 2024-04-08 ENCOUNTER — Ambulatory Visit (INDEPENDENT_AMBULATORY_CARE_PROVIDER_SITE_OTHER): Admitting: Orthopedic Surgery

## 2024-04-08 ENCOUNTER — Other Ambulatory Visit (INDEPENDENT_AMBULATORY_CARE_PROVIDER_SITE_OTHER): Payer: Self-pay

## 2024-04-08 ENCOUNTER — Ambulatory Visit (HOSPITAL_BASED_OUTPATIENT_CLINIC_OR_DEPARTMENT_OTHER): Payer: Self-pay | Admitting: Cardiology

## 2024-04-08 DIAGNOSIS — S82301D Unspecified fracture of lower end of right tibia, subsequent encounter for closed fracture with routine healing: Secondary | ICD-10-CM

## 2024-04-08 DIAGNOSIS — S82831D Other fracture of upper and lower end of right fibula, subsequent encounter for closed fracture with routine healing: Secondary | ICD-10-CM | POA: Diagnosis not present

## 2024-04-08 NOTE — Progress Notes (Signed)
 Orthopedic Surgery Post-operative Office Visit  Procedure: right pilon and tibia fracture ORIF Date of Surgery: 03/21/2024 (~2 weeks post-op)  Assessment: Patient is a 78 y.o. who is doing well after surgery   Plan: -Operative plans complete -Nonweightbearing right lower extremity in boot -Sutures removed today in office -Okay to let soap/water run over the incision but do not submerge -DVT ppx: Xarelto  10mg  daily -Pain management: tylenol  as needed -Return to office in 4 weeks, x-rays needed at next visit: AP/lateral/mortise right ankle  ___________________________________________________________________________   Subjective: Patient's pain is improved significantly since surgery.  She has stopped using narcotics and is using Tylenol  to control her pain.  She is still at a skilled nursing facility.  She has been nonweightbearing at that facility.  She is supposed to be going home in the next week.  She has not noticed any redness or drainage around her incision.  Objective:  General: no acute distress, appropriate affect Neurologic: alert, answering questions appropriately, following commands Respiratory: unlabored breathing on room air Skin: incision is well-approximated with no erythema, induration, active/expressible drainage  MSK (RLE): EHL/TA/GSC intact, plantar flexes and dorsiflexes toes, sensation intact light touch in sural/saphenous/deep peroneal/superficial peroneal/tibial nerve distributions, foot warm and well-perfused  Imaging: XRs of the right ankle from 04/08/2024 were independently reviewed and interpreted, showing right pilon and distal tibia fractures.  Alignment appears unchanged since immediate postoperative films.  There is an anterior plate over the distal tibia.  No lucency seen around the plate or any of the associated screws.  The ankle mortise appears well-maintained.  There are 2 screws from medial to lateral over the distal tibia that are in similar  position to immediate postoperative images with no lucency around them.  No new fracture seen.   Patient name: Beverly Wheeler Patient MRN: [id number] Date of visit: 04/08/24

## 2024-04-11 DIAGNOSIS — D509 Iron deficiency anemia, unspecified: Secondary | ICD-10-CM | POA: Diagnosis not present

## 2024-04-11 DIAGNOSIS — L22 Diaper dermatitis: Secondary | ICD-10-CM | POA: Diagnosis not present

## 2024-04-11 DIAGNOSIS — E119 Type 2 diabetes mellitus without complications: Secondary | ICD-10-CM | POA: Diagnosis not present

## 2024-04-12 ENCOUNTER — Encounter (HOSPITAL_BASED_OUTPATIENT_CLINIC_OR_DEPARTMENT_OTHER): Payer: Self-pay | Admitting: Cardiology

## 2024-04-15 DIAGNOSIS — K219 Gastro-esophageal reflux disease without esophagitis: Secondary | ICD-10-CM | POA: Diagnosis not present

## 2024-04-15 DIAGNOSIS — E785 Hyperlipidemia, unspecified: Secondary | ICD-10-CM | POA: Diagnosis not present

## 2024-04-15 DIAGNOSIS — E119 Type 2 diabetes mellitus without complications: Secondary | ICD-10-CM | POA: Diagnosis not present

## 2024-04-15 DIAGNOSIS — I10 Essential (primary) hypertension: Secondary | ICD-10-CM | POA: Diagnosis not present

## 2024-04-18 DIAGNOSIS — E119 Type 2 diabetes mellitus without complications: Secondary | ICD-10-CM | POA: Diagnosis not present

## 2024-04-18 DIAGNOSIS — L22 Diaper dermatitis: Secondary | ICD-10-CM | POA: Diagnosis not present

## 2024-04-18 DIAGNOSIS — D509 Iron deficiency anemia, unspecified: Secondary | ICD-10-CM | POA: Diagnosis not present

## 2024-04-25 ENCOUNTER — Telehealth: Payer: Self-pay

## 2024-04-25 NOTE — Transitions of Care (Post Inpatient/ED Visit) (Signed)
   04/25/2024  Name: Neidy Guerrieri MRN: [id number] DOB: [birth year]  Today's TOC FU Call Status: Today's TOC FU Call Status:: Unsuccessful Call (1st Attempt) Unsuccessful Call (1st Attempt) Date: 04/25/24  Attempted to reach the patient regarding the most recent Inpatient/ED visit.  Follow Up Plan: Additional outreach attempts will be made to reach the patient to complete the Transitions of Care (Post Inpatient/ED visit) call.   Signature Avelina Essex, CMA (AAMA)  [facility] [phone]

## 2024-04-29 NOTE — Transitions of Care (Post Inpatient/ED Visit) (Signed)
   04/29/2024  Name: Beverly Wheeler MRN: [id number] DOB: [birth year]  Today's TOC FU Call Status: Today's TOC FU Call Status:: Unsuccessful Call (2nd Attempt) Unsuccessful Call (1st Attempt) Date: 04/25/24 Unsuccessful Call (2nd Attempt) Date: 04/29/24  Attempted to reach the patient regarding the most recent Inpatient/ED visit.  Follow Up Plan: Additional outreach attempts will be made to reach the patient to complete the Transitions of Care (Post Inpatient/ED visit) call.   Signature  Avelina Essex, CMA (AAMA)  [facility] [phone]

## 2024-04-30 ENCOUNTER — Telehealth: Payer: Self-pay | Admitting: *Deleted

## 2024-04-30 NOTE — Telephone Encounter (Signed)
 I have not seen patient since 04/2023. Status post ORIF of right ankle due to fracture, on 03/20/2024. She needs verbal orders from her orthopedist. Thanks, BJ

## 2024-04-30 NOTE — Transitions of Care (Post Inpatient/ED Visit) (Signed)
   04/30/2024  Name: Beverly Wheeler MRN: [id number] DOB: 1945/11/13  Today's TOC FU Call Status: Today's TOC FU Call Status:: Unsuccessful Call (3rd Attempt) Unsuccessful Call (1st Attempt) Date: 04/25/24 Unsuccessful Call (2nd Attempt) Date: 04/29/24 Unsuccessful Call (3rd Attempt) Date: 04/30/24  Attempted to reach the patient regarding the most recent Inpatient/ED visit.  Follow Up Plan: No further outreach attempts will be made at this time. We have been unable to contact the patient.  Signature  Avelina Essex, CMA (AAMA)  [facility] [phone]

## 2024-04-30 NOTE — Telephone Encounter (Signed)
 Copied from CRM [id number]. Topic: Clinical - Home Health Verbal Orders >> Apr 30, 2024  9:12 AM Mercedes MATSU wrote: Caller/Agency: Powell Boas ([facility]) Callback Number: [phone] (secure line, able to voicemail) Service Requested: Skilled Nursing Frequency: Start up care this Friday 05/03/2024, is requesting verbal order. Had patient scheduled for start of care last week, but the son called last minute and cancelled. Any new concerns about the patient? No

## 2024-04-30 NOTE — Telephone Encounter (Signed)
 Spoke with [employer]. Explained Dr. Gib response. Heather voiced understanding.

## 2024-05-01 ENCOUNTER — Telehealth: Payer: Self-pay | Admitting: Orthopedic Surgery

## 2024-05-01 NOTE — Telephone Encounter (Signed)
 [facility], Powell called wanting to know if Dr.Moore would sign off on their home health orders to see pt. Call back number is [phone].

## 2024-05-01 NOTE — Telephone Encounter (Signed)
 I called and spoke with Powell, I advised that Dr. Georgina would sign off on orders pertaining to her surgery

## 2024-05-06 ENCOUNTER — Telehealth: Payer: Self-pay

## 2024-05-06 NOTE — Telephone Encounter (Signed)
 Copied from CRM [id number]. Topic: Clinical - Home Health Verbal Orders >> May 06, 2024 12:15 PM Larissa RAMAN wrote: Caller/Agency: Pam,Rn/ [facility]  Callback Number: [phone], please leave VM  Service Requested: Skilled Nursing Frequency: 1 week x 4, 2 month x 1, 2 prn visits Any new concerns about the patient? No

## 2024-05-07 NOTE — Telephone Encounter (Signed)
 I have not seen patient since 04/2023. Status post ORIF of right ankle due to fracture, on 03/20/2024. She needs verbal orders from her orthopedist. Thanks, BJ

## 2024-05-08 NOTE — Telephone Encounter (Signed)
 Representative Pam notified of Dr. Gib response. Representative voiced understanding.

## 2024-05-09 ENCOUNTER — Telehealth: Payer: Self-pay | Admitting: Radiology

## 2024-05-09 ENCOUNTER — Ambulatory Visit (INDEPENDENT_AMBULATORY_CARE_PROVIDER_SITE_OTHER): Admitting: Orthopedic Surgery

## 2024-05-09 ENCOUNTER — Other Ambulatory Visit (INDEPENDENT_AMBULATORY_CARE_PROVIDER_SITE_OTHER): Payer: Self-pay

## 2024-05-09 DIAGNOSIS — S82831D Other fracture of upper and lower end of right fibula, subsequent encounter for closed fracture with routine healing: Secondary | ICD-10-CM | POA: Diagnosis not present

## 2024-05-09 DIAGNOSIS — S82301D Unspecified fracture of lower end of right tibia, subsequent encounter for closed fracture with routine healing: Secondary | ICD-10-CM

## 2024-05-09 MED ORDER — DOXYCYCLINE HYCLATE 100 MG PO TABS: 100.0000 mg | ORAL_TABLET | Freq: Two times a day (BID) | ORAL | 0 refills | Status: AC

## 2024-05-09 NOTE — Telephone Encounter (Signed)
 Pam, RN called, states that they would like to follow pt for 1 week 4, 2 month 1, 2 PRN.  And asked if Dr. Georgina would sign the orders for her and I advised yes on the request for all orders.

## 2024-05-09 NOTE — Progress Notes (Signed)
 Orthopedic Surgery Post-operative Office Visit   Procedure: right pilon and tibia fracture ORIF Date of Surgery: 03/21/2024 (~6 weeks post-op)   Assessment: Patient is a 78 y.o. who has had a small dehiscence of the wound with fibrinous material but a healthy base underneath that the distal aspect of the incision     Plan: -Operative plans complete -The fibrinous material was debrided to a healthy red appearing base today in the office -Start wet-to-dry dressing changes over the fibrous material.  Went over the way to do this with the patient and her son.  They should be done daily -Prescribed a course of doxycycline  -Nonweightbearing right lower extremity in boot -Okay to let soap/water run over the incision but do not submerge -DVT ppx: Xarelto  10mg  daily -Pain management: tylenol  as needed -Return to office in 2 weeks, x-rays needed at next visit: none   ___________________________________________________________________________     Subjective: Patient does not have any significant pain in her ankle at this point.  She has been nonweightbearing in the boot.  She is back at home with her son.  She has noticed that there is scabbing at the distal aspect of her wound.  She has not noticed any drainage from it.  There is a slight redness around the incision that she has noticed.   Objective:   General: no acute distress, appropriate affect Neurologic: alert, answering questions appropriately, following commands Respiratory: unlabored breathing on room air Skin: incision is with fibrinous material in the base of the distal wound, no active or expressible drainage, about 3mm of erythema around the fibrous material, no other erythema seen   MSK (RLE): EHL/TA/GSC intact, plantar flexes and dorsiflexes toes, sensation intact light touch in sural/saphenous/deep peroneal/superficial peroneal/tibial nerve distributions, foot warm and well-perfused   Imaging: XRs of the right ankle from  05/09/2024 were independently reviewed and interpreted, showing right pilon fracture and distal tibia fracture.  Alignment appears similar to prior films on 04/08/2024.  No interval callus formation is seen.  No lucency seen around the fixation.  Ankle mortise is well-maintained.  No new fracture seen.     Patient name: Beverly Wheeler Patient MRN: [id number] Date of visit: 05/09/24

## 2024-05-10 ENCOUNTER — Telehealth: Payer: Self-pay | Admitting: Orthopedic Surgery

## 2024-05-10 NOTE — Telephone Encounter (Signed)
 Jim the OT called from [facility] called for verbal orders. OT 1 wk 3. CB#[phone]

## 2024-05-10 NOTE — Telephone Encounter (Signed)
 I called and spoke with Signe, gave verbal auth for OT and advised of Dr. Jeraline note

## 2024-05-14 ENCOUNTER — Telehealth: Payer: Self-pay

## 2024-05-14 NOTE — Telephone Encounter (Signed)
 Left detailed message on secure line for Beverly Wheeler. Advised verbal authorization needs to come from Orthopedist.

## 2024-05-14 NOTE — Telephone Encounter (Signed)
 Copied from CRM #[id number]. Topic: Clinical - Home Health Verbal Orders >> May 14, 2024 10:39 AM Franky GRADE wrote: Caller/Agency: [facility] / [facility] Callback Number: [phone] Service Requested: Physical Therapy Frequency: 2 week 2 , 1 week 6 Any new concerns about the patient? No

## 2024-05-14 NOTE — Telephone Encounter (Signed)
 I have not seen patient since 04/2023. Status post ORIF of right ankle due to fracture, on 03/20/2024. She needs verbal orders from her orthopedist. Thanks, BJ

## 2024-05-15 ENCOUNTER — Other Ambulatory Visit (HOSPITAL_BASED_OUTPATIENT_CLINIC_OR_DEPARTMENT_OTHER): Payer: Self-pay

## 2024-05-15 ENCOUNTER — Other Ambulatory Visit (HOSPITAL_COMMUNITY): Payer: Self-pay | Admitting: Cardiology

## 2024-05-16 ENCOUNTER — Other Ambulatory Visit (HOSPITAL_BASED_OUTPATIENT_CLINIC_OR_DEPARTMENT_OTHER): Payer: Self-pay

## 2024-05-16 ENCOUNTER — Other Ambulatory Visit (HOSPITAL_COMMUNITY): Payer: Self-pay | Admitting: Cardiology

## 2024-05-17 ENCOUNTER — Telehealth: Payer: Self-pay | Admitting: Cardiology

## 2024-05-17 ENCOUNTER — Other Ambulatory Visit (HOSPITAL_BASED_OUTPATIENT_CLINIC_OR_DEPARTMENT_OTHER): Payer: Self-pay

## 2024-05-17 ENCOUNTER — Other Ambulatory Visit (HOSPITAL_COMMUNITY): Payer: Self-pay | Admitting: Cardiology

## 2024-05-17 MED ORDER — SPIRONOLACTONE 25 MG PO TABS: 12.5000 mg | ORAL_TABLET | Freq: Every day | ORAL | 1 refills | Status: AC

## 2024-05-17 MED ORDER — LOSARTAN POTASSIUM 25 MG PO TABS: 12.5000 mg | ORAL_TABLET | Freq: Every day | ORAL | 1 refills | Status: DC

## 2024-05-17 NOTE — Telephone Encounter (Signed)
*  STAT* If patient is at the pharmacy, call can be transferred to refill team.   1. Which medications need to be refilled? (please list name of each medication and dose if known) losartan  (COZAAR ) 25 MG tablet   spironolactone  (ALDACTONE ) 25 MG tablet   2. Which pharmacy/location (including street and city if local pharmacy) is medication to be sent to?  [id number] - [place], [place] - [address] AT [address] OF [address]    3. Do they need a 30 day or 90 day supply? 90

## 2024-05-17 NOTE — Telephone Encounter (Signed)
 RX sent in

## 2024-05-23 ENCOUNTER — Ambulatory Visit (INDEPENDENT_AMBULATORY_CARE_PROVIDER_SITE_OTHER): Admitting: Orthopedic Surgery

## 2024-05-23 DIAGNOSIS — T8130XA Disruption of wound, unspecified, initial encounter: Secondary | ICD-10-CM

## 2024-05-23 NOTE — Progress Notes (Signed)
 Orthopedic Surgery Post-operative Office Visit   Procedure: right pilon and tibia fracture ORIF Date of Surgery: 03/21/2024 (~8 weeks post-op)   Assessment: Patient is a 78 y.o. whose wound is healing and the dehiscence is much smaller on today's visit     Plan: -Operative plans complete -Complete antibiotic course -Continue wet to dry dressings daily -Nonweightbearing right lower extremity in boot -Okay to let soap/water run over the incision but do not submerge -DVT ppx: Xarelto  10mg  daily -Pain management: tylenol  as needed -Return to office in 4 weeks, x-rays needed at next visit: none   ___________________________________________________________________________     Subjective: Patient and son have been doing daily wet to dry dressing changes. She has also been taking the antibiotic. Has not noticed any redness or drainage. No pain in the ankle. Feels her incision is looking better.   Objective:   General: no acute distress, appropriate affect Neurologic: alert, answering questions appropriately, following commands Respiratory: unlabored breathing on room air Skin: incision is better approximated with very small area with healthy appearing base of tissue, picture placed in chart   MSK (RLE): EHL/TA/GSC intact, plantar flexes and dorsiflexes toes, sensation intact light touch in sural/saphenous/deep peroneal/superficial peroneal/tibial nerve distributions, foot warm and well-perfused   Imaging: XRs of the right ankle from 05/09/2024 were previously independently reviewed and interpreted, showing right pilon fracture and distal tibia fracture.  Alignment appears similar to prior films on 04/08/2024.  No interval callus formation is seen.  No lucency seen around the fixation.  Ankle mortise is well-maintained.  No new fracture seen.     Patient name: Beverly Wheeler Patient MRN: [id number] Date of visit: 05/23/24

## 2024-05-27 ENCOUNTER — Encounter: Payer: Self-pay | Admitting: Radiology

## 2024-06-19 ENCOUNTER — Other Ambulatory Visit: Payer: Self-pay

## 2024-06-19 ENCOUNTER — Ambulatory Visit (INDEPENDENT_AMBULATORY_CARE_PROVIDER_SITE_OTHER): Admitting: Orthopedic Surgery

## 2024-06-19 DIAGNOSIS — S82301D Unspecified fracture of lower end of right tibia, subsequent encounter for closed fracture with routine healing: Secondary | ICD-10-CM

## 2024-06-19 DIAGNOSIS — S82831D Other fracture of upper and lower end of right fibula, subsequent encounter for closed fracture with routine healing: Secondary | ICD-10-CM

## 2024-06-19 NOTE — Progress Notes (Signed)
 Orthopedic Surgery Post-operative Office Visit   Procedure: right pilon and tibia fracture ORIF Date of Surgery: 03/21/2024 (~3 months post-op)   Assessment: Patient is a 78 y.o. whose wound has healed.  Not having any ankle pain.     Plan: -Operative plans complete -Weightbearing as tolerated right lower extremity in boot, can transition out of the boot in 1 month and move to regular shoes -Okay to submerge wound at this point -DVT ppx: none -Pain management: tylenol  as needed -Return to office in 3 months, x-rays needed at next visit: AP/mortise/lateral right ankle weightbearing   ___________________________________________________________________________     Subjective: Patient's wound has completely healed.  She is not having any pain in the ankle.  She has been nonweightbearing having her son help her with mobilization.   Objective:   General: no acute distress, appropriate affect Neurologic: alert, answering questions appropriately, following commands Respiratory: unlabored breathing on room air Skin: incision is well-healed with no erythema, induration, active/expressible drainage   MSK (RLE): ankle ROM from neutral to 30 degrees, EHL/TA/GSC intact, plantar flexes and dorsiflexes toes, sensation intact light touch in sural/saphenous/deep peroneal/superficial peroneal/tibial nerve distributions, foot warm and well-perfused   Imaging: XRs of the right ankle from 06/19/2024 were independently reviewed and interpreted, showing anterior distal tibia fixation for a pilon fracture.  No lucency seen around the screws.  No screws were backed out.  Alignment appears similar to prior films on 05/09/2024.  Fracture line is no longer as distinct as previously.  Ankle mortise is maintained.  No new fracture seen.     Patient name: Beverly Wheeler Patient MRN: [id number] Date of visit: 06/19/24

## 2024-06-25 ENCOUNTER — Other Ambulatory Visit: Payer: Self-pay | Admitting: Family Medicine

## 2024-06-27 ENCOUNTER — Telehealth: Payer: Self-pay

## 2024-06-27 DIAGNOSIS — S82871D Displaced pilon fracture of right tibia, subsequent encounter for closed fracture with routine healing: Secondary | ICD-10-CM

## 2024-06-27 NOTE — Telephone Encounter (Signed)
 Tanner, PT with [facility] would like to know if Dr. Georgina can do a peer to peer with patient's insurance due to being denied for extension of care.  Stated that patient is not able walk.  CB# for [facility] is [phone].   [facility] Appeals # [phone] or [phone]. Please advise.  Thank you

## 2024-06-28 NOTE — Addendum Note (Signed)
 Addended by: GEORGINA SHARPER on: 06/28/2024 05:51 PM   Modules accepted: Orders

## 2024-07-01 ENCOUNTER — Telehealth: Payer: Self-pay | Admitting: Orthopedic Surgery

## 2024-07-01 DIAGNOSIS — S82871D Displaced pilon fracture of right tibia, subsequent encounter for closed fracture with routine healing: Secondary | ICD-10-CM

## 2024-07-01 NOTE — Telephone Encounter (Signed)
 Pt called saying that she was no longer approved for pt and is wondering if Dr. Georgina could extend her therapy.They filled an appeal, but the appeal, but it was  denied. Tanner Perser, her therapist strongly disagrees with the appeal. Call back number is [phone] .

## 2024-07-16 ENCOUNTER — Telehealth: Payer: Self-pay | Admitting: Cardiology

## 2024-07-21 ENCOUNTER — Other Ambulatory Visit: Payer: Self-pay | Admitting: Family Medicine

## 2024-07-24 ENCOUNTER — Other Ambulatory Visit: Payer: Self-pay | Admitting: Family Medicine

## 2024-07-24 DIAGNOSIS — E1169 Type 2 diabetes mellitus with other specified complication: Secondary | ICD-10-CM

## 2024-07-29 ENCOUNTER — Other Ambulatory Visit: Payer: Self-pay | Admitting: Family Medicine

## 2024-08-02 ENCOUNTER — Other Ambulatory Visit: Payer: Self-pay

## 2024-08-02 ENCOUNTER — Telehealth: Payer: Self-pay

## 2024-08-02 MED ORDER — NEBIVOLOL HCL 5 MG PO TABS: 5.0000 mg | ORAL_TABLET | Freq: Every day | ORAL | 0 refills | Status: DC

## 2024-08-02 NOTE — Telephone Encounter (Signed)
 Refilled up till her appointment date. Gave patient a 5 DS

## 2024-08-02 NOTE — Telephone Encounter (Signed)
 Copied from CRM [id number]. Topic: Clinical - Medication Question >> Aug 02, 2024  3:57 PM Aisha D wrote: Reason for CRM: Pt submitted a refill request for the nebivolol  (BYSTOLIC ) 5 MG tablet but was refused due to needing an appt. Pt recently scheduled an appt for 1/13 and wants to know if the medication could be refilled and sent to the pharmacy today if possible. Pt stated that she is currently out of this medication and didn't want to wait until Monday to get the medication. Pt would like a callback with an update.

## 2024-08-06 ENCOUNTER — Ambulatory Visit: Admitting: Family Medicine

## 2024-08-06 ENCOUNTER — Ambulatory Visit: Payer: Self-pay

## 2024-08-06 DIAGNOSIS — E1169 Type 2 diabetes mellitus with other specified complication: Secondary | ICD-10-CM

## 2024-08-06 NOTE — Telephone Encounter (Signed)
 FYI Only or Action Required?: Action required by provider: update on patient condition. Rescheduled to 08/09/2024 Patient was last seen in primary care on 09/26/2023 by Luke Chiquita SAUNDERS, DO.  Called Nurse Triage reporting Nausea, Vomiting, and Diarrhea.  Symptoms began today.  Interventions attempted: Dietary changes.  Symptoms are: stable.  Triage Disposition: Home Care  Patient/caregiver understands and will follow disposition?:   Copied from CRM #[id number]. Topic: Clinical - Red Word Triage >> Aug 06, 2024  9:20 AM Harlene ORN wrote: Red Word that prompted transfer to Nurse Triage: dizziness and vomiting Reason for Disposition  Unexplained nausea  Answer Assessment - Initial Assessment Questions 1. NAUSEA SEVERITY: How bad is the nausea? (e.g., mild, moderate, severe; dehydration, weight loss)     mild 2. ONSET: When did the nausea begin?     today 3. VOMITING: Any vomiting? If Yes, ask: How many times today?     One time 4. RECURRENT SYMPTOM: Have you had nausea before? If Yes, ask: When was the last time? What happened that time?     Sometimes when she eats something bad 5. CAUSE: What do you think is causing the nausea?     unsure  Protocols used: Nausea-A-AH

## 2024-08-09 ENCOUNTER — Ambulatory Visit: Admitting: Family Medicine

## 2024-08-12 ENCOUNTER — Telehealth: Payer: Self-pay | Admitting: Family Medicine

## 2024-08-12 NOTE — Telephone Encounter (Signed)
 Patient has late canceled/no showed 3 appointments since 08/14/23 which qualifies for dismissal. Please advise, thx

## 2024-08-12 NOTE — Telephone Encounter (Signed)
 She has an appt on 08/16/24, if she does not show or cancel, we can proceed with dismissal. Thanks, BJ

## 2024-08-16 ENCOUNTER — Ambulatory Visit: Admitting: Family Medicine

## 2024-08-16 ENCOUNTER — Encounter: Payer: Self-pay | Admitting: Family Medicine

## 2024-08-16 ENCOUNTER — Telehealth: Payer: Self-pay

## 2024-08-16 VITALS — BP 110/60 | HR 77 | Temp 98.9°F | Resp 16 | Ht 61.0 in | Wt 179.4 lb

## 2024-08-16 DIAGNOSIS — D509 Iron deficiency anemia, unspecified: Secondary | ICD-10-CM | POA: Diagnosis not present

## 2024-08-16 DIAGNOSIS — E1122 Type 2 diabetes mellitus with diabetic chronic kidney disease: Secondary | ICD-10-CM

## 2024-08-16 DIAGNOSIS — I10 Essential (primary) hypertension: Secondary | ICD-10-CM

## 2024-08-16 DIAGNOSIS — J449 Chronic obstructive pulmonary disease, unspecified: Secondary | ICD-10-CM

## 2024-08-16 DIAGNOSIS — Z7984 Long term (current) use of oral hypoglycemic drugs: Secondary | ICD-10-CM | POA: Diagnosis not present

## 2024-08-16 DIAGNOSIS — I5020 Unspecified systolic (congestive) heart failure: Secondary | ICD-10-CM

## 2024-08-16 DIAGNOSIS — I130 Hypertensive heart and chronic kidney disease with heart failure and stage 1 through stage 4 chronic kidney disease, or unspecified chronic kidney disease: Secondary | ICD-10-CM

## 2024-08-16 DIAGNOSIS — N1832 Chronic kidney disease, stage 3b: Secondary | ICD-10-CM

## 2024-08-16 DIAGNOSIS — E1169 Type 2 diabetes mellitus with other specified complication: Secondary | ICD-10-CM

## 2024-08-16 LAB — POCT GLYCOSYLATED HEMOGLOBIN (HGB A1C): Hemoglobin A1C: 5.4 % (ref 4.0–5.6)

## 2024-08-16 MED ORDER — NEBIVOLOL HCL 5 MG PO TABS: 5.0000 mg | ORAL_TABLET | Freq: Every day | ORAL | 1 refills | Status: AC

## 2024-08-16 MED ORDER — SPIRIVA RESPIMAT 2.5 MCG/ACT IN AERS: 1.0000 | INHALATION_SPRAY | Freq: Every day | RESPIRATORY_TRACT | 2 refills | Status: DC | PRN

## 2024-08-16 MED ORDER — DAPAGLIFLOZIN PROPANEDIOL 10 MG PO TABS: 10.0000 mg | ORAL_TABLET | Freq: Every day | ORAL | 2 refills | Status: AC

## 2024-08-16 MED ORDER — DULERA 200-5 MCG/ACT IN AERO: 2.0000 | INHALATION_SPRAY | Freq: Every day | RESPIRATORY_TRACT | 2 refills | Status: DC

## 2024-08-16 NOTE — Telephone Encounter (Signed)
 Patient was seen in the office today 08/16/24. Upon her leaving with her son, her son informed us  that they needed assistance getting her in the car. When I went out to help the patient get into the car I saw her sitting on the ground near the car with the door open. Patient and the patients son stated that she didn't fall that she simply slide down. Dr. Jordan came out to help us . Dr. Jordan did a assessment and determined that the patient was not hurt and was able to be moved and helped into the car. Patient was instructed on warning signs and was told that if she starts hurting anywhere she needs to go to the ER. Patient and patients son voiced understanding. All three of us  (Dr. Jordan, patients son, and me) tried to help her get into the car but she started sliding down from the seat again. So we sat her down back on the ground. Dr. Jordan went inside back to finish seeing her patients and I went inside to ask Mykel another CMA for help. He agreed to help me and we walked back outside together to help her into the car. Mykel and the patients son helped her up back up and helped her into the car. This last attempt was success. Patient and the patients son voiced extreme gratitude and was stated they were thankful that we were able to help them.

## 2024-08-16 NOTE — Telephone Encounter (Signed)
Beverly Glendenning, MD  

## 2024-08-16 NOTE — Assessment & Plan Note (Signed)
 She would like to add Spiriva  to her current management, she felt like it helped in the past. Currently on Dulera  2 puff daily. Spiriva  2.5 mcg 1 puff daily as needed. Currently on continuous supplemental O2, 2 to 3 L/min.  For now she is not interested in seeing pulmonologist. Follow-up in 4 months.

## 2024-08-16 NOTE — Progress Notes (Signed)
 "  Chief Complaint  Patient presents with   Medical Management of Chronic Issues   Discussed the use of AI scribe software for clinical note transcription with the patient, who gave verbal consent to proceed.  History of Present Illness Beverly Wheeler is a 79 year old femalewith a PMHx significant for DM II,CAD, COPD, CKD III, HFrEF, and seasonal allergies here today with her son for chronic disease management. Last seen on 05/12/23.  Since her last visit she was hospitalized from 03/20/2024 to 03/26/2024, she was discharged to a SNF. She returned home on May 03, 2024. Transportation issues have delayed her follow-up appointments.  She suffered a fall complicated with ankle fracture, right distal tibia/fibular fracture.  Status post ORIF on 03/21/2024.  Postop course was complicated by episode of weakness and confusion, head CT was negative.  She is no longer receiving physical therapy at home due to insurance termination on June 27, 2024. She uses a walker for short distances at home and fears falling again. She is not using a wheelchair full-time.  Occasional swelling in her legs, particularly the one that was operated on,right ankle, but denies significant pain In general she doe snot feel like she is at her baseline but gradually improving.  COPD:Currently she is on Dulera  200-5 mcg 2 puff daily and Albuterol  inh. She would like a ra for Spiriva , which helps in the past. No changes in chronic cough, dyspnea,or wheezing. On continuous O2 supplementation 2-2.5 LMP [place]. She has not not had fever or chills. + Dry mouth, drinks Gatorade , small sips through the day.  She reports occasional constipation and uses Miralax  as needed. She also mentions an episode of diarrhea yesterday.  Hypertension and HFrEF: Currently she is on losartan  25 mg daily, Bystolic  5 mg daily, spironolactone  25 mg daily, and Farxiga  10 mg daily. Home blood pressure readings are around 110/60 mmHg.  Last  echo in 03/2023: LVEF 25-30% and grade 2 diastolic dysfunction. There is akinesis of the left ventricular, mid-apical anteroseptal wall, anterior wall and apical segment. There is severe hypokinesis of the left ventricular, entire inferior wall and inferolateral wall.   Follows with cardiology regularly. She does not remember if she is still taking Xarelto .  Negative for unusual or severe headache, visual changes, worsening exertional chest pain, dyspnea,  focal weakness, or worsening edema.  Lab Results  Component Value Date   NA 138 03/27/2024   CL 96 (L) 03/27/2024   K 4.7 03/27/2024   CO2 34 (H) 03/27/2024   BUN 21 03/27/2024   CREATININE 1.16 (H) 03/27/2024   GFRNONAA 49 (L) 03/27/2024   CALCIUM  8.3 (L) 03/27/2024   PHOS 4.7 (H) 03/21/2024   ALBUMIN  2.6 (L) 03/22/2024   GLUCOSE 84 03/27/2024   Diabetes Mellitus II: :Dx'ed  in 03/2022 with HgA1C of 9.0.  BS up to 160's after meals. She is on Farxiga  10 mg daily and Metformin  500 mg bid with food. Negative for symptoms of hypoglycemia, polyuria, polydipsia, foot ulcers/trauma  Last HgA1C was 6.2 on 03/23/24  Iron  def anemia; She is not on iron  supplementation at this time. She has not noted blood in stool or melena. Lab Results  Component Value Date   WBC 8.6 03/27/2024   HGB 9.0 (L) 03/27/2024   HCT 28.7 (L) 03/27/2024   MCV 94.7 03/27/2024   PLT 236 03/27/2024   Review of Systems  Constitutional:  Positive for fatigue. Negative for activity change and appetite change.  HENT:  Negative for mouth  sores, sore throat and trouble swallowing.   Respiratory:  Negative for cough and wheezing.   Gastrointestinal:  Negative for abdominal pain, nausea and vomiting.  Endocrine: Negative for cold intolerance and heat intolerance.  Genitourinary:  Negative for decreased urine volume, dysuria and hematuria.  Musculoskeletal:  Positive for arthralgias and gait problem.  Neurological:  Negative for syncope and facial asymmetry.   Psychiatric/Behavioral:  Negative for confusion and hallucinations.   See other pertinent positives and negatives in HPI.  Medications Ordered Prior to Encounter[1]  Past Medical History:  Diagnosis Date   Allergy    Asthma    CAD (coronary artery disease)    COPD (chronic obstructive pulmonary disease) (HCC)    Depression    Diabetes mellitus without complication (HCC)    GERD (gastroesophageal reflux disease)    Hyperlipidemia    Hypertension    Myocardial infarction (HCC) 10/16/2013    Allergies[2]  Social History   Socioeconomic History   Marital status: Widowed    Spouse name: Not on file   Number of children: 1   Years of education: Not on file   Highest education level: High school graduate  Occupational History   Occupation: retired  Tobacco Use   Smoking status: Former    Current packs/day: 0.00    Average packs/day: 1 pack/day for 45.0 years (45.0 ttl pk-yrs)    Types: Cigarettes    Start date: 10/16/1968    Quit date: 10/16/2013    Years since quitting: 10.8   Smokeless tobacco: Never  Vaping Use   Vaping status: Never Used  Substance and Sexual Activity   Alcohol use: Not Currently    Comment: occasional   Drug use: No   Sexual activity: Not Currently  Other Topics Concern   Not on file  Social History Narrative   Not on file   Social Drivers of Health   Tobacco Use: Medium Risk (08/16/2024)   Patient History    Smoking Tobacco Use: Former    Smokeless Tobacco Use: Never    Passive Exposure: Not on file  Financial Resource Strain: Low Risk (04/12/2023)   Overall Financial Resource Strain (CARDIA)    Difficulty of Paying Living Expenses: Not hard at all  Food Insecurity: No Food Insecurity (03/22/2024)   Epic    Worried About Radiation Protection Practitioner of Food in the Last Year: Never true    Ran Out of Food in the Last Year: Never true  Transportation Needs: No Transportation Needs (03/22/2024)   Epic    Lack of Transportation (Medical): No    Lack of  Transportation (Non-Medical): No  Physical Activity: Inactive (05/02/2022)   Exercise Vital Sign    Days of Exercise per Week: 0 days    Minutes of Exercise per Session: 0 min  Stress: No Stress Concern Present (05/02/2022)   [employer] of Occupational Health - Occupational Stress Questionnaire    Feeling of Stress : Not at all  Social Connections: Socially Isolated (03/22/2024)   Social Connection and Isolation Panel    Frequency of Communication with Friends and Family: Twice a week    Frequency of Social Gatherings with Friends and Family: Twice a week    Attends Religious Services: Never    Database Administrator or Organizations: No    Attends Banker Meetings: Never    Marital Status: Widowed  Depression (PHQ2-9): Low Risk (09/26/2023)   Depression (PHQ2-9)    PHQ-2 Score: 1  Alcohol Screen: Low Risk (04/12/2023)  Alcohol Screen    Last Alcohol Screening Score (AUDIT): 0  Housing: Low Risk (03/22/2024)   Epic    Unable to Pay for Housing in the Last Year: No    Number of Times Moved in the Last Year: 0    Homeless in the Last Year: No  Utilities: Not At Risk (03/22/2024)   Epic    Threatened with loss of utilities: No  Health Literacy: Not on file    Today's Vitals   08/16/24 1548  BP: 110/60  Pulse: 77  Resp: 16  Temp: 98.9 F (37.2 C)  SpO2: 98%  Weight: 179 lb 6.4 oz (81.4 kg)  Height: 5' 1 (1.549 m)   Body mass index is 33.9 kg/m.  Physical Exam Vitals and nursing note reviewed.  Constitutional:      General: She is not in acute distress.    Appearance: She is well-developed.  HENT:     Head: Normocephalic and atraumatic.     Mouth/Throat:     Mouth: Mucous membranes are moist.  Eyes:     Conjunctiva/sclera: Conjunctivae normal.  Cardiovascular:     Rate and Rhythm: Normal rate and regular rhythm.     Heart sounds: No murmur heard.    Comments: Trace pitting LE edema, peri ankle R>L. DP pulses palpable. Pulmonary:     Effort:  Pulmonary effort is normal. No respiratory distress.     Breath sounds: Normal breath sounds. Decreased air movement present. No wheezing, rhonchi or rales.     Comments: On continuous supplemental O2 2 L/min per [place]. Abdominal:     Palpations: Abdomen is soft. There is no mass.     Tenderness: There is no abdominal tenderness.  Lymphadenopathy:     Cervical: No cervical adenopathy.  Skin:    General: Skin is warm.     Findings: No erythema or rash.  Neurological:     General: No focal deficit present.     Mental Status: She is alert and oriented to person, place, and time.     Comments: In a wheel chair.  Psychiatric:        Mood and Affect: Mood and affect normal.    ASSESSMENT AND PLAN:  Beverly Wheeler was seen today for medical management of chronic issues.  Diagnoses and all orders for this visit:  Orders Placed This Encounter  Procedures   Microalbumin / creatinine urine ratio   Comprehensive metabolic panel with GFR   CBC   POC HgB A1c   Lab Results  Component Value Date   HGBA1C 5.4 08/16/2024   Component     Latest Ref Rng 08/16/2024  Glucose     70 - 99 mg/dL 876 (H)   BUN     8 - 23 mg/dL 24   Creatinine     9.55 - 1.00 mg/dL 8.16 (H)   Sodium     864 - 145 mmol/L 141   Potassium     3.5 - 5.1 mmol/L 5.7 (H)   Chloride     98 - 111 mmol/L 101   CO2     22 - 32 mmol/L 25   Calcium      8.9 - 10.3 mg/dL 9.1   Total Protein     6.5 - 8.1 g/dL 6.3   Total Bilirubin     0.0 - 1.2 mg/dL <9.7   AST     15 - 41 U/L 8   ALT     0 - 44 U/L 5  Alkaline Phosphatase     38 - 126 U/L 124   Albumin      3.5 - 5.0 g/dL 3.9     Component     Latest Ref Rng 08/16/2024  WBC     4.0 - 10.5 K/uL 8.8   RBC     3.87 - 5.11 MIL/uL 2.22 (LL)   Hemoglobin     12.0 - 15.0 g/dL 8.3 (L)   HCT     63.9 - 46.0 % 22.6 (L)   MCV     80.0 - 100.0 fL 102 (H)   MCHC     30.0 - 36.0 g/dL 63.2 (H)   RDW     88.4 - 15.5 % 13.0   Platelets     150 - 400 K/uL 164     Type  2 diabetes mellitus with other specified complication, without long-term current use of insulin  (HCC) Assessment & Plan: Problem is well-controlled, A1c today 5.4. She can stop metformin . Continue Farxiga  10 mg daily. We reviewed dietary recommendations. Periodic eye exams and appropriate footcare recommended. F/U in 3-4 months.  Orders: -     Microalbumin / creatinine urine ratio; Future -     Comprehensive metabolic panel with GFR; Future -     CBC; Future -     POCT glycosylated hemoglobin (Hb A1C) -     Dapagliflozin  Propanediol; Take 1 tablet (10 mg total) by mouth daily.  Dispense: 90 tablet; Refill: 2  Essential hypertension BP adequately controlled. Continue losartan  25 mg daily, Bystolic  5 mg daily, spironolactone  25 mg daily. Low salt diet also recommended, caution with drinking Gatorade. Continue monitoring BP at home. Has appt with cardiologist scheduled.   -     Comprehensive metabolic panel with GFR; Future  COPD  GOLD II based on fev1/VC of 58%  Assessment & Plan: Lung auscultation negative for rales or wheezing, + diffused hypoventilation. She would like to add Spiriva  to her current management, she felt like it helped in the past. Currently on Dulera  200-5 mcg 2 puff daily. Spiriva  2.5 mcg 1 puff daily as needed. Currently on continuous supplemental O2, 2 to 3 L/min.  For now she is not interested in seeing pulmonologist. Instructed about warning signs. Follow-up in 4 months.  Orders: -     Dulera ; Inhale 2 puffs into the lungs daily.  Dispense: 13 g; Refill: 2 -     Spiriva  Respimat; Inhale 1 puff into the lungs daily as needed.  Dispense: 4 g; Refill: 2  Stage 3b chronic kidney disease (HCC) Assessment & Plan: Problem has been stable. Last e GFR 49 and Cr 1.16 in 03/2024. Low-salt diet and avoidance of NSAIDs to continue. Adequate hydration, glucose, and BP control. Currently on Farxiga  10 mg daily and losartan  25 mg daily.  Orders: -     Microalbumin  / creatinine urine ratio; Future -     Comprehensive metabolic panel with GFR; Future -     CBC; Future -     Dapagliflozin  Propanediol; Take 1 tablet (10 mg total) by mouth daily.  Dispense: 90 tablet; Refill: 2  Iron  deficiency anemia, unspecified iron  deficiency anemia type Assessment & Plan: And anemia or chronic disease. She has declined colonoscopy in the past. Given her general health status, I do not think we need to pursuit at this time but needs to be considered if anemia gets worse. She is not on iron  supplementation.  Further recommendation will be given according to CBC results.  HFrEF (  heart failure with reduced ejection fraction) (HCC) Assessment & Plan: Minimal LE edema noted today. Currently on losartan  25 mg daily, spironolactone  25 mg daily, Bystolic  5 mg daily, and Farxiga  10 mg daily. Continue low-salt diet. She has an appointment with her cardiologist 10/03/2024.  Orders: -     Nebivolol  HCl; Take 1 tablet (5 mg total) by mouth daily.  Dispense: 90 tablet; Refill: 1 -     Dapagliflozin  Propanediol; Take 1 tablet (10 mg total) by mouth daily.  Dispense: 90 tablet; Refill: 2   I was called to evaluate Ms Ogan , who had a fall in the parking lot, when trying to get into his car. See tel note for details about fall, no serious injury. No bony pain, pain with ROM,or changes in movement after fall.  I personally spent a total of 46 minutes in the care of the patient today including preparing to see the patient, getting/reviewing separately obtained history, performing a medically appropriate exam/evaluation, counseling and educating, placing orders, and documenting clinical information in the EHR.  Return in about 17 weeks (around 12/13/2024) for chronic problems.  Yannely Kintzel, MD [facility]. [place] office.      [1]  Current Outpatient Medications on File Prior to Visit  Medication Sig Dispense Refill   Accu-Chek Softclix Lancets lancets Use to  check blood sugars 1-2 times daily. Dx:e11.9 100 each 12   albuterol  (VENTOLIN  HFA) 108 (90 Base) MCG/ACT inhaler INHALE 1 PUFF EVERY 4 HOURS AS NEEDED FOR WHEEZING OR SHORTNESS OF BREATH. 17 each 2   atorvastatin  (LIPITOR ) 80 MG tablet TAKE 1 TABLET BY MOUTH EVERY DAY AT 6PM 90 tablet 1   Blood Glucose Monitoring Suppl (ACCU-CHEK AVIVA PLUS) w/Device KIT Use to test blood sugars 1-2 times daily. Dx: E11.9 1 kit 0   cetirizine (ZYRTEC) 10 MG tablet Take 10 mg by mouth daily.     escitalopram  (LEXAPRO ) 20 MG tablet TAKE 1 TABLET BY MOUTH EVERY DAY 90 tablet 2   ezetimibe  (ZETIA ) 10 MG tablet Take 1 tablet (10 mg total) by mouth daily. 90 tablet 1   famotidine  (PEPCID ) 20 MG tablet Take 1 tablet (20 mg total) by mouth daily as needed for heartburn.     ferrous gluconate  (FERGON) 324 MG tablet TAKE 1 TABLET BY MOUTH TWICE A DAY WITH MEALS 180 tablet 1   furosemide  (LASIX ) 40 MG tablet TAKE 1 TABLET (40 MG TOTAL) BY MOUTH 3 (THREE) TIMES A WEEK. 45 tablet 3   glucose blood (ACCU-CHEK GUIDE TEST) test strip USE TO CHECK BLOOD SUGARS 4 TIMES DAILY 400 strip 3   losartan  (COZAAR ) 25 MG tablet Take 0.5 tablets (12.5 mg total) by mouth daily. 45 tablet 1   nitroGLYCERIN  (NITROSTAT ) 0.4 MG SL tablet Place 1 tablet (0.4 mg total) under the tongue every 5 (five) minutes as needed for chest pain. 25 tablet 0   rivaroxaban  (XARELTO ) 10 MG TABS tablet Take 1 tablet (10 mg total) by mouth daily. 42 tablet 0   spironolactone  (ALDACTONE ) 25 MG tablet Take 0.5 tablets (12.5 mg total) by mouth daily. 45 tablet 1   traMADol  (ULTRAM ) 50 MG tablet Take 1 tablet (50 mg total) by mouth every 8 (eight) hours as needed for up to 4 doses for moderate pain (pain score 4-6). 4 tablet 0   vitamin B-12 1000 MCG tablet Take 1 tablet (1,000 mcg total) by mouth daily.     montelukast  (SINGULAIR ) 10 MG tablet Take 1 tablet (10 mg total) by  mouth at bedtime. (Patient not taking: Reported on 08/16/2024) 30 tablet 3   No current  facility-administered medications on file prior to visit.  [2] No Known Allergies  "

## 2024-08-16 NOTE — Assessment & Plan Note (Signed)
 Euvolemic today. Currently on losartan  25 mg daily, spironolactone  25 mg daily, Bystolic  5 mg daily, and Farxiga  10 mg daily. Continue low-salt diet. She has an appointment with her cardiologist 10/03/2024.

## 2024-08-16 NOTE — Assessment & Plan Note (Signed)
 Problem has been stable. Last e GFR 49 and Cr 1.16 in 03/2024. Low-salt diet and avoidance of NSAIDs to continue. Adequate hydration, glucose, and BP control. Currently on Farxiga  10 mg daily and losartan  25 mg daily.

## 2024-08-16 NOTE — Patient Instructions (Addendum)
 A few things to remember from today's visit:  Type 2 diabetes mellitus with other specified complication, without long-term current use of insulin  (HCC) - Plan: Microalbumin / creatinine urine ratio, Comprehensive metabolic panel with GFR, CBC, POC HgB A1c, CBC, Comprehensive metabolic panel with GFR, dapagliflozin  propanediol (FARXIGA ) 10 MG TABS tablet  Essential hypertension - Plan: Comprehensive metabolic panel with GFR, Comprehensive metabolic panel with GFR  COPD  GOLD II based on fev1/VC of 58%  - Plan: mometasone -formoterol  (DULERA ) 200-5 MCG/ACT AERO, Tiotropium Bromide  (SPIRIVA  RESPIMAT) 2.5 MCG/ACT AERS  Stage 3b chronic kidney disease (HCC) - Plan: Microalbumin / creatinine urine ratio, Comprehensive metabolic panel with GFR, CBC, CBC, Comprehensive metabolic panel with GFR, dapagliflozin  propanediol (FARXIGA ) 10 MG TABS tablet  Iron  deficiency anemia, unspecified iron  deficiency anemia type  HFrEF (heart failure with reduced ejection fraction) (HCC) - Plan: nebivolol  (BYSTOLIC ) 5 MG tablet, dapagliflozin  propanediol (FARXIGA ) 10 MG TABS tablet  Stop Metformin . I will see you back in 4 months. Spiriva  once daily as needed.  If you need refills for medications you take chronically, please call your pharmacy. Do not use My Chart to request refills or for acute issues that need immediate attention. If you send a my chart message, it may take a few days to be addressed, specially if I am not in the office.  Please be sure medication list is accurate. If a new problem present, please set up appointment sooner than planned today.

## 2024-08-16 NOTE — Assessment & Plan Note (Signed)
 Problem is well-controlled, A1c today 5.4. She can stop metformin . Continue Farxiga  10 mg daily. We reviewed dietary recommendations. Periodic eye exams and appropriate footcare recommended. F/U in 3-4 months.

## 2024-08-16 NOTE — Assessment & Plan Note (Signed)
 She is not on iron  supplementation. Further recommendation will be given according to CBC results.

## 2024-08-17 ENCOUNTER — Inpatient Hospital Stay (HOSPITAL_COMMUNITY)
Admission: EM | Admit: 2024-08-17 | Discharge: 2024-08-22 | DRG: 291 | Disposition: A | Discharge status: Home Health | Attending: Family Medicine | Admitting: Family Medicine

## 2024-08-17 ENCOUNTER — Emergency Department (HOSPITAL_COMMUNITY)

## 2024-08-17 ENCOUNTER — Encounter (HOSPITAL_COMMUNITY): Payer: Self-pay | Admitting: *Deleted

## 2024-08-17 ENCOUNTER — Other Ambulatory Visit: Payer: Self-pay

## 2024-08-17 DIAGNOSIS — Z951 Presence of aortocoronary bypass graft: Secondary | ICD-10-CM

## 2024-08-17 DIAGNOSIS — I5023 Acute on chronic systolic (congestive) heart failure: Secondary | ICD-10-CM | POA: Diagnosis present

## 2024-08-17 DIAGNOSIS — F419 Anxiety disorder, unspecified: Secondary | ICD-10-CM | POA: Diagnosis present

## 2024-08-17 DIAGNOSIS — J449 Chronic obstructive pulmonary disease, unspecified: Secondary | ICD-10-CM

## 2024-08-17 DIAGNOSIS — Z8249 Family history of ischemic heart disease and other diseases of the circulatory system: Secondary | ICD-10-CM

## 2024-08-17 DIAGNOSIS — Z818 Family history of other mental and behavioral disorders: Secondary | ICD-10-CM

## 2024-08-17 DIAGNOSIS — F32A Depression, unspecified: Secondary | ICD-10-CM | POA: Diagnosis present

## 2024-08-17 DIAGNOSIS — E875 Hyperkalemia: Secondary | ICD-10-CM | POA: Diagnosis present

## 2024-08-17 DIAGNOSIS — N179 Acute kidney failure, unspecified: Secondary | ICD-10-CM | POA: Diagnosis present

## 2024-08-17 DIAGNOSIS — Z8261 Family history of arthritis: Secondary | ICD-10-CM

## 2024-08-17 DIAGNOSIS — J9621 Acute and chronic respiratory failure with hypoxia: Secondary | ICD-10-CM | POA: Diagnosis present

## 2024-08-17 DIAGNOSIS — E785 Hyperlipidemia, unspecified: Secondary | ICD-10-CM | POA: Diagnosis present

## 2024-08-17 DIAGNOSIS — E669 Obesity, unspecified: Secondary | ICD-10-CM | POA: Diagnosis present

## 2024-08-17 DIAGNOSIS — I509 Heart failure, unspecified: Secondary | ICD-10-CM

## 2024-08-17 DIAGNOSIS — E119 Type 2 diabetes mellitus without complications: Secondary | ICD-10-CM

## 2024-08-17 DIAGNOSIS — I2510 Atherosclerotic heart disease of native coronary artery without angina pectoris: Secondary | ICD-10-CM | POA: Diagnosis present

## 2024-08-17 DIAGNOSIS — Z7951 Long term (current) use of inhaled steroids: Secondary | ICD-10-CM

## 2024-08-17 DIAGNOSIS — R54 Age-related physical debility: Secondary | ICD-10-CM | POA: Diagnosis present

## 2024-08-17 DIAGNOSIS — Z813 Family history of other psychoactive substance abuse and dependence: Secondary | ICD-10-CM

## 2024-08-17 DIAGNOSIS — Z87891 Personal history of nicotine dependence: Secondary | ICD-10-CM

## 2024-08-17 DIAGNOSIS — D631 Anemia in chronic kidney disease: Secondary | ICD-10-CM | POA: Diagnosis present

## 2024-08-17 DIAGNOSIS — Z6833 Body mass index (BMI) 33.0-33.9, adult: Secondary | ICD-10-CM

## 2024-08-17 DIAGNOSIS — I252 Old myocardial infarction: Secondary | ICD-10-CM

## 2024-08-17 DIAGNOSIS — J9601 Acute respiratory failure with hypoxia: Principal | ICD-10-CM | POA: Diagnosis present

## 2024-08-17 DIAGNOSIS — Z9981 Dependence on supplemental oxygen: Secondary | ICD-10-CM

## 2024-08-17 DIAGNOSIS — J441 Chronic obstructive pulmonary disease with (acute) exacerbation: Principal | ICD-10-CM | POA: Diagnosis present

## 2024-08-17 DIAGNOSIS — Z1152 Encounter for screening for COVID-19: Secondary | ICD-10-CM

## 2024-08-17 DIAGNOSIS — Z803 Family history of malignant neoplasm of breast: Secondary | ICD-10-CM

## 2024-08-17 DIAGNOSIS — I5020 Unspecified systolic (congestive) heart failure: Secondary | ICD-10-CM | POA: Diagnosis present

## 2024-08-17 DIAGNOSIS — G9341 Metabolic encephalopathy: Secondary | ICD-10-CM | POA: Diagnosis present

## 2024-08-17 DIAGNOSIS — E1165 Type 2 diabetes mellitus with hyperglycemia: Secondary | ICD-10-CM | POA: Diagnosis present

## 2024-08-17 DIAGNOSIS — E1122 Type 2 diabetes mellitus with diabetic chronic kidney disease: Secondary | ICD-10-CM | POA: Diagnosis present

## 2024-08-17 DIAGNOSIS — N1832 Chronic kidney disease, stage 3b: Secondary | ICD-10-CM | POA: Diagnosis present

## 2024-08-17 DIAGNOSIS — D509 Iron deficiency anemia, unspecified: Secondary | ICD-10-CM | POA: Diagnosis present

## 2024-08-17 DIAGNOSIS — I130 Hypertensive heart and chronic kidney disease with heart failure and stage 1 through stage 4 chronic kidney disease, or unspecified chronic kidney disease: Principal | ICD-10-CM | POA: Diagnosis present

## 2024-08-17 DIAGNOSIS — Z634 Disappearance and death of family member: Secondary | ICD-10-CM

## 2024-08-17 DIAGNOSIS — Z515 Encounter for palliative care: Secondary | ICD-10-CM

## 2024-08-17 DIAGNOSIS — Z66 Do not resuscitate: Secondary | ICD-10-CM | POA: Diagnosis present

## 2024-08-17 DIAGNOSIS — Z83438 Family history of other disorder of lipoprotein metabolism and other lipidemia: Secondary | ICD-10-CM

## 2024-08-17 DIAGNOSIS — Z7901 Long term (current) use of anticoagulants: Secondary | ICD-10-CM

## 2024-08-17 DIAGNOSIS — Z79899 Other long term (current) drug therapy: Secondary | ICD-10-CM

## 2024-08-17 HISTORY — DX: Heart failure, unspecified: I50.9

## 2024-08-17 LAB — I-STAT CHEM 8, ED
BUN: 27 mg/dL — ABNORMAL HIGH (ref 8–23)
Calcium, Ion: 1.2 mmol/L (ref 1.15–1.40)
Chloride: 100 mmol/L (ref 98–111)
Creatinine, Ser: 1.8 mg/dL — ABNORMAL HIGH (ref 0.44–1.00)
Glucose, Bld: 150 mg/dL — ABNORMAL HIGH (ref 70–99)
HCT: 26 % — ABNORMAL LOW (ref 36.0–46.0)
Hemoglobin: 8.8 g/dL — ABNORMAL LOW (ref 12.0–15.0)
Potassium: 4.5 mmol/L (ref 3.5–5.1)
Sodium: 141 mmol/L (ref 135–145)
TCO2: 31 mmol/L (ref 22–32)

## 2024-08-17 LAB — CBC
Hematocrit: 22.6 % — ABNORMAL LOW (ref 34.0–46.6)
Hemoglobin: 8.3 g/dL — ABNORMAL LOW (ref 11.1–15.9)
MCH: 37.4 pg — ABNORMAL HIGH (ref 26.6–33.0)
MCHC: 36.7 g/dL — ABNORMAL HIGH (ref 31.5–35.7)
MCV: 102 fL — ABNORMAL HIGH (ref 79–97)
Platelets: 164 10*3/uL (ref 150–450)
RBC: 2.22 x10E6/uL — CL (ref 3.77–5.28)
RDW: 13 % (ref 11.7–15.4)
WBC: 8.8 10*3/uL (ref 3.4–10.8)

## 2024-08-17 LAB — I-STAT VENOUS BLOOD GAS, ED
Acid-Base Excess: 5 mmol/L — ABNORMAL HIGH (ref 0.0–2.0)
Bicarbonate: 32.3 mmol/L — ABNORMAL HIGH (ref 20.0–28.0)
Calcium, Ion: 1.23 mmol/L (ref 1.15–1.40)
HCT: 26 % — ABNORMAL LOW (ref 36.0–46.0)
Hemoglobin: 8.8 g/dL — ABNORMAL LOW (ref 12.0–15.0)
O2 Saturation: 86 %
Potassium: 4.5 mmol/L (ref 3.5–5.1)
Sodium: 140 mmol/L (ref 135–145)
TCO2: 34 mmol/L — ABNORMAL HIGH (ref 22–32)
pCO2, Ven: 65.3 mmHg — ABNORMAL HIGH (ref 44–60)
pH, Ven: 7.303 (ref 7.25–7.43)
pO2, Ven: 59 mmHg — ABNORMAL HIGH (ref 32–45)

## 2024-08-17 LAB — COMPREHENSIVE METABOLIC PANEL WITH GFR
ALT: 5 [IU]/L (ref 0–32)
ALT: 7 U/L (ref 0–44)
AST: 8 [IU]/L (ref 0–40)
AST: 16 U/L (ref 15–41)
Albumin: 3.9 g/dL (ref 3.8–4.8)
Albumin: 3.7 g/dL (ref 3.5–5.0)
Alkaline Phosphatase: 124 [IU]/L (ref 49–135)
Alkaline Phosphatase: 120 U/L (ref 38–126)
Anion gap: 10 (ref 5–15)
BUN/Creatinine Ratio: 13 (ref 12–28)
BUN: 24 mg/dL (ref 8–27)
BUN: 22 mg/dL (ref 8–23)
Bilirubin Total: <0.2 mg/dL (ref 0.0–1.2)
CO2: 25 mmol/L (ref 20–29)
CO2: 29 mmol/L (ref 22–32)
Calcium: 9.1 mg/dL (ref 8.7–10.3)
Calcium: 9.5 mg/dL (ref 8.9–10.3)
Chloride: 101 mmol/L (ref 96–106)
Chloride: 102 mmol/L (ref 98–111)
Creatinine, Ser: 1.83 mg/dL — ABNORMAL HIGH (ref 0.57–1.00)
Creatinine, Ser: 1.71 mg/dL — ABNORMAL HIGH (ref 0.44–1.00)
GFR, Estimated: 31 mL/min — ABNORMAL LOW
Globulin, Total: 2.4 g/dL (ref 1.5–4.5)
Glucose, Bld: 150 mg/dL — ABNORMAL HIGH (ref 70–99)
Glucose: 123 mg/dL — ABNORMAL HIGH (ref 70–99)
Potassium: 5.7 mmol/L — ABNORMAL HIGH (ref 3.5–5.2)
Potassium: 4.5 mmol/L (ref 3.5–5.1)
Sodium: 141 mmol/L (ref 134–144)
Sodium: 140 mmol/L (ref 135–145)
Total Bilirubin: 0.3 mg/dL (ref 0.0–1.2)
Total Protein: 6.3 g/dL (ref 6.0–8.5)
Total Protein: 6.7 g/dL (ref 6.5–8.1)
eGFR: 28 mL/min/{1.73_m2} — ABNORMAL LOW

## 2024-08-17 LAB — CBC WITH DIFFERENTIAL/PLATELET
Abs Immature Granulocytes: 0.05 10*3/uL (ref 0.00–0.07)
Basophils Absolute: 0.1 10*3/uL (ref 0.0–0.1)
Basophils Relative: 1 %
Eosinophils Absolute: 0.3 10*3/uL (ref 0.0–0.5)
Eosinophils Relative: 3 %
HCT: 26.1 % — ABNORMAL LOW (ref 36.0–46.0)
Hemoglobin: 8.4 g/dL — ABNORMAL LOW (ref 12.0–15.0)
Immature Granulocytes: 1 %
Lymphocytes Relative: 24 %
Lymphs Abs: 2.2 10*3/uL (ref 0.7–4.0)
MCH: 31.9 pg (ref 26.0–34.0)
MCHC: 32.2 g/dL (ref 30.0–36.0)
MCV: 99.2 fL (ref 80.0–100.0)
Monocytes Absolute: 0.5 10*3/uL (ref 0.1–1.0)
Monocytes Relative: 5 %
Neutro Abs: 5.8 10*3/uL (ref 1.7–7.7)
Neutrophils Relative %: 66 %
Platelets: 155 10*3/uL (ref 150–400)
RBC: 2.63 MIL/uL — ABNORMAL LOW (ref 3.87–5.11)
RDW: 12.8 % (ref 11.5–15.5)
WBC: 8.9 10*3/uL (ref 4.0–10.5)
nRBC: 0 % (ref 0.0–0.2)

## 2024-08-17 LAB — PRO BRAIN NATRIURETIC PEPTIDE: Pro Brain Natriuretic Peptide: 5290 pg/mL — ABNORMAL HIGH

## 2024-08-17 LAB — TROPONIN T, HIGH SENSITIVITY
Troponin T High Sensitivity: 32 ng/L — ABNORMAL HIGH (ref 0–19)
Troponin T High Sensitivity: 42 ng/L — ABNORMAL HIGH (ref 0–19)

## 2024-08-17 LAB — RESP PANEL BY RT-PCR (RSV, FLU A&B, COVID)  RVPGX2
Influenza A by PCR: NEGATIVE
Influenza B by PCR: NEGATIVE
Resp Syncytial Virus by PCR: NEGATIVE
SARS Coronavirus 2 by RT PCR: NEGATIVE

## 2024-08-17 LAB — AMMONIA: Ammonia: 21 umol/L (ref 9–35)

## 2024-08-17 LAB — GLUCOSE, CAPILLARY: Glucose-Capillary: 158 mg/dL — ABNORMAL HIGH (ref 70–99)

## 2024-08-17 LAB — CBG MONITORING, ED: Glucose-Capillary: 215 mg/dL — ABNORMAL HIGH (ref 70–99)

## 2024-08-17 MED ORDER — ALBUTEROL SULFATE (2.5 MG/3ML) 0.083% IN NEBU
5.0000 mg | INHALATION_SOLUTION | Freq: Once | RESPIRATORY_TRACT | Status: AC
  Administered 2024-08-17: 5 mg via RESPIRATORY_TRACT
  Filled 2024-08-17: qty 6

## 2024-08-17 MED ORDER — ESCITALOPRAM OXALATE 10 MG PO TABS
20.0000 mg | ORAL_TABLET | Freq: Every day | ORAL | Status: DC
  Administered 2024-08-17 – 2024-08-22 (×6): 20 mg via ORAL
  Filled 2024-08-17 (×6): qty 2

## 2024-08-17 MED ORDER — METHYLPREDNISOLONE SODIUM SUCC 125 MG IJ SOLR: 125.0000 mg | Freq: Once | INTRAMUSCULAR | Status: DC

## 2024-08-17 MED ORDER — ENOXAPARIN SODIUM 30 MG/0.3ML IJ SOSY
30.0000 mg | PREFILLED_SYRINGE | INTRAMUSCULAR | Status: DC
  Administered 2024-08-17 – 2024-08-22 (×6): 30 mg via SUBCUTANEOUS
  Filled 2024-08-17 (×6): qty 0.3

## 2024-08-17 MED ORDER — PANTOPRAZOLE SODIUM 40 MG PO TBEC
40.0000 mg | DELAYED_RELEASE_TABLET | Freq: Every day | ORAL | Status: DC
  Administered 2024-08-17 – 2024-08-22 (×6): 40 mg via ORAL
  Filled 2024-08-17 (×6): qty 1

## 2024-08-17 MED ORDER — ATORVASTATIN CALCIUM 40 MG PO TABS
80.0000 mg | ORAL_TABLET | Freq: Every day | ORAL | Status: DC
  Administered 2024-08-17 – 2024-08-21 (×5): 80 mg via ORAL
  Filled 2024-08-17 (×5): qty 2

## 2024-08-17 MED ORDER — PREDNISONE 20 MG PO TABS
50.0000 mg | ORAL_TABLET | Freq: Every day | ORAL | Status: DC
  Administered 2024-08-18: 50 mg via ORAL
  Filled 2024-08-17: qty 1

## 2024-08-17 MED ORDER — AZITHROMYCIN 250 MG PO TABS
500.0000 mg | ORAL_TABLET | Freq: Every day | ORAL | Status: AC
  Administered 2024-08-18 – 2024-08-19 (×2): 500 mg via ORAL
  Filled 2024-08-17 (×2): qty 2

## 2024-08-17 MED ORDER — POTASSIUM CHLORIDE CRYS ER 20 MEQ PO TBCR
40.0000 meq | EXTENDED_RELEASE_TABLET | Freq: Every day | ORAL | Status: DC
  Administered 2024-08-17: 40 meq via ORAL
  Filled 2024-08-17 (×2): qty 2

## 2024-08-17 MED ORDER — NEBIVOLOL HCL 5 MG PO TABS
5.0000 mg | ORAL_TABLET | Freq: Every day | ORAL | Status: DC
  Administered 2024-08-17 – 2024-08-22 (×5): 5 mg via ORAL
  Filled 2024-08-17 (×6): qty 1

## 2024-08-17 MED ORDER — FUROSEMIDE 10 MG/ML IJ SOLN
40.0000 mg | Freq: Once | INTRAMUSCULAR | Status: AC
  Administered 2024-08-17: 40 mg via INTRAVENOUS
  Filled 2024-08-17: qty 4

## 2024-08-17 MED ORDER — INSULIN ASPART 100 UNIT/ML IJ SOLN
0.0000 [IU] | Freq: Three times a day (TID) | INTRAMUSCULAR | Status: DC
  Administered 2024-08-17: 3 [IU] via SUBCUTANEOUS
  Administered 2024-08-18 – 2024-08-19 (×3): 1 [IU] via SUBCUTANEOUS
  Administered 2024-08-20: 3 [IU] via SUBCUTANEOUS
  Administered 2024-08-21: 2 [IU] via SUBCUTANEOUS
  Administered 2024-08-22: 1 [IU] via SUBCUTANEOUS
  Filled 2024-08-17: qty 3
  Filled 2024-08-17: qty 1
  Filled 2024-08-17: qty 3
  Filled 2024-08-17: qty 1
  Filled 2024-08-17: qty 2
  Filled 2024-08-17 (×3): qty 1

## 2024-08-17 MED ORDER — FUROSEMIDE 10 MG/ML IJ SOLN
40.0000 mg | Freq: Every day | INTRAMUSCULAR | Status: DC
  Administered 2024-08-18: 40 mg via INTRAVENOUS
  Filled 2024-08-17: qty 4

## 2024-08-17 MED ORDER — SODIUM CHLORIDE 0.9% FLUSH: 3.0000 mL | INTRAVENOUS | Status: DC | PRN

## 2024-08-17 MED ORDER — SODIUM CHLORIDE 0.9 % IV SOLN
500.0000 mg | INTRAVENOUS | Status: AC
  Administered 2024-08-17: 500 mg via INTRAVENOUS
  Filled 2024-08-17: qty 5

## 2024-08-17 MED ORDER — LEVALBUTEROL HCL 0.63 MG/3ML IN NEBU
0.6300 mg | INHALATION_SOLUTION | RESPIRATORY_TRACT | Status: DC | PRN
  Administered 2024-08-17: 0.63 mg via RESPIRATORY_TRACT

## 2024-08-17 MED ORDER — ACETAMINOPHEN 500 MG PO TABS
1000.0000 mg | ORAL_TABLET | ORAL | Status: AC
  Administered 2024-08-17: 1000 mg via ORAL
  Filled 2024-08-17: qty 2

## 2024-08-17 MED ORDER — ONDANSETRON HCL 4 MG PO TABS: 4.0000 mg | ORAL_TABLET | Freq: Four times a day (QID) | ORAL | Status: DC | PRN

## 2024-08-17 MED ORDER — SODIUM CHLORIDE 0.9 % IV SOLN: 250.0000 mL | INTRAVENOUS | Status: AC | PRN

## 2024-08-17 MED ORDER — ACETAMINOPHEN 650 MG RE SUPP: 650.0000 mg | Freq: Four times a day (QID) | RECTAL | Status: DC | PRN

## 2024-08-17 MED ORDER — ONDANSETRON HCL 4 MG/2ML IJ SOLN: 4.0000 mg | Freq: Four times a day (QID) | INTRAMUSCULAR | Status: DC | PRN

## 2024-08-17 MED ORDER — SODIUM CHLORIDE 0.9% FLUSH
3.0000 mL | Freq: Two times a day (BID) | INTRAVENOUS | Status: DC
  Administered 2024-08-17 – 2024-08-22 (×10): 3 mL via INTRAVENOUS

## 2024-08-17 MED ORDER — NITROGLYCERIN 0.4 MG SL SUBL: 0.4000 mg | SUBLINGUAL_TABLET | SUBLINGUAL | Status: DC | PRN

## 2024-08-17 MED ORDER — ALBUTEROL SULFATE (2.5 MG/3ML) 0.083% IN NEBU: 2.5000 mg | INHALATION_SOLUTION | RESPIRATORY_TRACT | Status: DC | PRN

## 2024-08-17 MED ORDER — ACETAMINOPHEN 325 MG PO TABS: 650.0000 mg | ORAL_TABLET | Freq: Four times a day (QID) | ORAL | Status: DC | PRN

## 2024-08-17 MED ORDER — LEVALBUTEROL HCL 0.63 MG/3ML IN NEBU
0.6300 mg | INHALATION_SOLUTION | Freq: Four times a day (QID) | RESPIRATORY_TRACT | Status: DC
  Administered 2024-08-18 – 2024-08-19 (×8): 0.63 mg via RESPIRATORY_TRACT
  Filled 2024-08-17 (×10): qty 3

## 2024-08-17 MED ORDER — ASPIRIN 81 MG PO TBEC
81.0000 mg | DELAYED_RELEASE_TABLET | Freq: Once | ORAL | Status: AC
  Administered 2024-08-17: 81 mg via ORAL
  Filled 2024-08-17: qty 1

## 2024-08-17 MED ORDER — EZETIMIBE 10 MG PO TABS
10.0000 mg | ORAL_TABLET | Freq: Every day | ORAL | Status: DC
  Administered 2024-08-17 – 2024-08-22 (×6): 10 mg via ORAL
  Filled 2024-08-17 (×6): qty 1

## 2024-08-17 NOTE — Assessment & Plan Note (Addendum)
 Baseline Cr around 1.2-1.4 w/ GFR in the 40s  Cr 1.7 today w/ GFR in the 20s  + concominant volume overload in setting of decompensated HFrEF  Monitor renal function w/ diuresis  Minimize nephrotoxic agents  Check FeNa to correlate  Renal imaging as clinically indicated

## 2024-08-17 NOTE — Assessment & Plan Note (Signed)
 COPD Exacerbation  Acute on chronic HFrEF  Decompensated respiratory status now requiring 3 L nasal cannula in setting of baseline chronic 2 L nasal cannula use at home Overall presentation appears fairly mixed with contribution of acute on chronic heart failure as well as COPD exacerbation Requiring 3 days nasal cannula to keep O2 sats in the mid 90s  Chest x-ray with cardiomegaly and nondescript lung findings concerning for pulmonary edema-focal infiltrate not overtly noted at present Will continue with IV Lasix  for diuresis Weight today 81.4 kg Strict ins and outs and daily weights Repeat 2D echo-consult cardiology for any significant deviation from baseline IV Solu-Medrol  Neb treatments Azithromycin  for respiratory coverage in setting of COPD flare VBG grossly stable-no acute indication for NIPPV at present Follow closely

## 2024-08-17 NOTE — Assessment & Plan Note (Addendum)
+   generalized lethargy on presentation  Noted decompensated resp status in setting volume overload and COPD exacerbation  VBG WNL  Nonfocal neuro exam per nursing evaluation  Will check ammonia level in setting of history of hyperammonemia  Add on UDS  Monitor mentation with treatment

## 2024-08-17 NOTE — ED Provider Notes (Signed)
 "  EMERGENCY DEPARTMENT AT [facility] Provider Note   CSN: [id number] Arrival date & time: 08/17/24  1243     Patient presents with: Shortness of Breath   Beverly Wheeler is a 79 y.o. female.  {Add pertinent medical, surgical, social history, OB history to HPI:7660} 79 year old female history of CAD, CHF, and COPD on 2 L home oxygen  who presents to the emergency department with generalized weakness.  Per son [name], the patient told him that she felt like she needed to go to the hospital. She seemed exhausted and started shivering and very weak. Had a difficult time describing what was going on with her. No recent illnesses that he's aware of. Didn't seem particularly confused to him.  He called 911 and when EMS arrived she was satting 90% but had some wheezing and when moved sats dropped to 70%.  Was given Solu-Medrol , albuterol , and Atrovent on the way over with improvement of her mentation after the breathing treatment.  Patient denies any pain to me at this point in time.  Says that she is not more swollen than usual but feels that she may need a fluid pill.       Prior to Admission medications  Medication Sig Start Date End Date Taking? Authorizing Provider  Accu-Chek Softclix Lancets lancets Use to check blood sugars 1-2 times daily. Dx:e11.9 05/04/20   Jordan, Betty G, MD  albuterol  (VENTOLIN  HFA) 108 [phone] Base) MCG/ACT inhaler INHALE 1 PUFF EVERY 4 HOURS AS NEEDED FOR WHEEZING OR SHORTNESS OF BREATH. 10/09/23   Jordan, Betty G, MD  atorvastatin  (LIPITOR ) 80 MG tablet TAKE 1 TABLET BY MOUTH EVERY DAY AT [place]EREL 11/06/23   Lonni Slain, MD  Blood Glucose Monitoring Suppl (ACCU-CHEK AVIVA PLUS) w/Device KIT Use to test blood sugars 1-2 times daily. Dx: E11.9 05/04/20   Jordan, Betty G, MD  cetirizine (ZYRTEC) 10 MG tablet Take 10 mg by mouth daily.    [provider]  dapagliflozin  propanediol (FARXIGA ) 10 MG TABS tablet Take 1 tablet (10 mg  total) by mouth daily. 08/16/24   Jordan, Betty G, MD  escitalopram  (LEXAPRO ) 20 MG tablet TAKE 1 TABLET BY MOUTH EVERY DAY 04/03/24   Jordan, Betty G, MD  ezetimibe  (ZETIA ) 10 MG tablet Take 1 tablet (10 mg total) by mouth daily. 11/06/23   Lonni Slain, MD  famotidine  (PEPCID ) 20 MG tablet Take 1 tablet (20 mg total) by mouth daily as needed for heartburn. 03/27/24   Christobal Guadalajara, MD  ferrous gluconate  (FERGON) 324 MG tablet TAKE 1 TABLET BY MOUTH TWICE A DAY WITH MEALS 08/09/19   Jordan, Betty G, MD  furosemide  (LASIX ) 40 MG tablet TAKE 1 TABLET (40 MG TOTAL) BY MOUTH 3 (THREE) TIMES A WEEK. 01/05/24   Lonni Slain, MD  glucose blood (ACCU-CHEK GUIDE TEST) test strip USE TO CHECK BLOOD SUGARS 4 TIMES DAILY 01/02/24   Jordan, Betty G, MD  losartan  (COZAAR ) 25 MG tablet Take 0.5 tablets (12.5 mg total) by mouth daily. 05/17/24   Lonni Slain, MD  mometasone -formoterol  (DULERA ) 200-5 MCG/ACT AERO Inhale 2 puffs into the lungs daily. 08/16/24   Jordan, Betty G, MD  montelukast  (SINGULAIR ) 10 MG tablet Take 1 tablet (10 mg total) by mouth at bedtime. Patient not taking: Reported on 08/16/2024 03/25/20   Jordan, Betty G, MD  nebivolol  (BYSTOLIC ) 5 MG tablet Take 1 tablet (5 mg total) by mouth daily. 08/16/24   Jordan, Betty G, MD  nitroGLYCERIN  (NITROSTAT ) 0.4 MG SL tablet  Place 1 tablet (0.4 mg total) under the tongue every 5 (five) minutes as needed for chest pain. 12/20/22   Jordan, Betty G, MD  rivaroxaban  (XARELTO ) 10 MG TABS tablet Take 1 tablet (10 mg total) by mouth daily. 03/22/24 08/16/24  Georgina Ozell LABOR, MD  spironolactone  (ALDACTONE ) 25 MG tablet Take 0.5 tablets (12.5 mg total) by mouth daily. 05/17/24   Lonni Slain, MD  Tiotropium Bromide  (SPIRIVA  RESPIMAT) 2.5 MCG/ACT AERS Inhale 1 puff into the lungs daily as needed. 08/16/24   Jordan, Betty G, MD  traMADol  (ULTRAM ) 50 MG tablet Take 1 tablet (50 mg total) by mouth every 8 (eight) hours as needed for up to 4 doses  for moderate pain (pain score 4-6). 03/27/24   Christobal Guadalajara, MD  vitamin B-12 1000 MCG tablet Take 1 tablet (1,000 mcg total) by mouth daily. 12/06/17   Singh, Prashant K, MD    Allergies: Patient has no known allergies.    Review of Systems  Updated Vital Signs BP 128/61 (BP Location: Right Arm)   Temp 97.7 F (36.5 C) (Oral)   Resp 20   Physical Exam Vitals and nursing note reviewed.  Constitutional:      General: She is not in acute distress.    Appearance: She is well-developed.  HENT:     Head: Normocephalic and atraumatic.     Right Ear: External ear normal.     Left Ear: External ear normal.     Nose: Nose normal.  Eyes:     Extraocular Movements: Extraocular movements intact.     Conjunctiva/sclera: Conjunctivae normal.     Pupils: Pupils are equal, round, and reactive to light.  Cardiovascular:     Rate and Rhythm: Normal rate and regular rhythm.     Heart sounds: No murmur heard. Pulmonary:     Effort: Pulmonary effort is normal. No respiratory distress.     Breath sounds: Wheezing present.  Musculoskeletal:     Cervical back: Normal range of motion and neck supple.     Right lower leg: Edema present.     Left lower leg: Edema present.  Skin:    General: Skin is warm and dry.  Neurological:     Mental Status: She is alert and oriented to person, place, and time. Mental status is at baseline.  Psychiatric:        Mood and Affect: Mood normal.     (all labs ordered are listed, but only abnormal results are displayed) Labs Reviewed  RESP PANEL BY RT-PCR (RSV, FLU A&B, COVID)  [id number]  CBC WITH DIFFERENTIAL/PLATELET  COMPREHENSIVE METABOLIC PANEL WITH GFR  PRO BRAIN NATRIURETIC PEPTIDE  I-STAT CHEM 8, ED  I-STAT VENOUS BLOOD GAS, ED    EKG: None  Radiology: No results found.  {Document cardiac monitor, telemetry assessment procedure when appropriate:32947} Procedures   Medications Ordered in the ED  albuterol  (PROVENTIL ) (2.5 MG/3ML) 0.083% nebulizer  solution 5 mg (has no administration in time range)      {Click here for ABCD2, HEART and other calculators REFRESH Note before signing:1}                              Medical Decision Making Amount and/or Complexity of Data Reviewed Labs: ordered. Radiology: ordered.  Risk Prescription drug management.   ***  {Document critical care time when appropriate  Document review of labs and clinical decision tools ie CHADS2VASC2, etc  Document your independent review  of radiology images and any outside records  Document your discussion with family members, caretakers and with consultants  Document social determinants of health affecting pt's care  Document your decision making why or why not admission, treatments were needed:32947:::1}   Final diagnoses:  None    ED Discharge Orders     None        "

## 2024-08-17 NOTE — Assessment & Plan Note (Addendum)
 Baseline CAD--recommended for CABG in 2015 with patient declining CABG Follows Dr. Shelda outpatient- last visit 10/2023  No reported active CP at present EKG stable  Trop in 30s  Baby ASA  Cont home regimen  Follow

## 2024-08-17 NOTE — ED Triage Notes (Signed)
 Pt here via [facility] from home.  Initial call was for generalized weakness and sleeping more than normal.    Pt always on 2L.  90% while sitting.  When pt stood to move to stretcher, sats dropped to 70%.  Placed on duo nebx e-nroute for exp wheezing with decreased lung sounds.  Pt ao x 4.  Mentation improved after breathing tx per ems.  Given en-route 10 albuterol  0.5 atrovent 125 solumedrol   18 L ac  98% on nebx Bp 153/80 Hr 80 Cbg 146

## 2024-08-17 NOTE — H&P (Addendum)
 " [facility] History and Physical   Referring Provider: Lamar Gander MD  Telemedicine Provider: Elspeth Masters MD  Provider Location: [facility]  Patient Location: [facility]  Referring Diagnosis: Acute on chronic resp failure w/ hypoxia, CHF and COPD flare  Patient Name and DOB verified: Yes  Patient consented to Telemedicine Evaluation:yes  RN virtual assistant: Erminio Richter RN  Video encounter time and date:08/17/24 1630   Additional Information:  Transfusion: no CPAP/BIPAP: no  O2:3L [place]  Foley: no     Patient: Beverly Wheeler FMW:[id number] DOB: [birth year] DOA: 08/17/2024 DOS: the patient was seen and examined on 08/17/2024 PCP: Jordan, Betty G, MD  Patient coming from: Home  Chief Complaint:  Chief Complaint  Patient presents with   Shortness of Breath   HPI: Beverly Wheeler is a 79 y.o. female with medical history significant of chronic hypoxic respiratory failure 2 L nasal cannula, COPD, CAD, CHF with reduced EF, CKD 3A, HTN, HLD, DM 2, iron  deficiency anemia presenting w/ acute on chronic respiratory failure with hypoxia, acute on chronic HFrEF, COPD exacerbation.  History from patient and chart. Per report, patient with generalized weakness, and fatigue. Mild cough and wheezing. Pt states that she was seen by her PCP yesterday. Has missed some of her medications including her BP medications though the patient is not altogether clear. Pt does report some high salt intake. Wears 2L [place] chronically. + shortness of breath on exertion. No longer smokes. No reported central chest pain, though does report some indigestion. No abdominal pain or diarrhea. EMS subsequently called at home in setting of lethargy. Had noted desats in 70s on 2L w/ EMS. S/p duoneb. ALso given solumedrol en route to ER. Per report, her mentation transiently improved with treatment.  Presented to the ER afebrile, HR 100s, RR 20s, BP stable. Satting in mid 90s on 3L [place]. WBC 8.9, hgb 8.4, plt  155, Cr 1.71 w/ GFR in 30s, glu 150, Pro BNP 5290, Trop 32, VBG WNL. CXR w/ central bronchovascular crowding and cardiomegaly. S/p IV lasix .  Noted admission 8/27-9/2 for issues including ground level fall w/ R distal tib/fib fracture s/p ORIF 02/2024. Was noted to have been dcd to SNF w/ transition home 05/03/2024.  Has some chronic weakness per the patient, though improved.  Review of Systems: As mentioned in the history of present illness. All other systems reviewed and are negative.  Past Surgical History:  Procedure Laterality Date   CARDIAC CATHETERIZATION  10/17/13   significant 3 vessel disease   LEFT HEART CATHETERIZATION WITH CORONARY ANGIOGRAM N/A 10/17/2013   Procedure: LEFT HEART CATHETERIZATION WITH CORONARY ANGIOGRAM;  Surgeon: Victory LELON Claudene DOUGLAS, MD;  Location: [facility] CATH LAB;  Service: Cardiovascular;  Laterality: N/A;   ORIF ANKLE FRACTURE Right 03/21/2024   Procedure: OPEN REDUCTION INTERNAL FIXATION (ORIF) ANKLE FRACTURE;  Surgeon: Georgina Ozell LABOR, MD;  Location: [facility] OR;  Service: Orthopedics;  Laterality: Right;   Social History:  reports that she quit smoking about 10 years ago. Her smoking use included cigarettes. She started smoking about 55 years ago. She has a 45 pack-year smoking history. She has never used smokeless tobacco. She reports that she does not currently use alcohol. She reports that she does not use drugs.  Allergies[1]  Family History  Problem Relation Age of Onset   Allergies Mother    Breast cancer Mother    Arthritis Mother    Cancer Mother    Hyperlipidemia Mother    Hypertension Mother  Heart disease Father    Heart disease Brother    Arthritis Brother    Cancer Brother    Depression Brother    Drug abuse Brother    Hyperlipidemia Brother    Heart attack Brother    Rheum arthritis Brother    Cancer Sister    Dementia Sister     Prior to Admission medications  Medication Sig Start Date End Date Taking? Authorizing Provider  Accu-Chek  Softclix Lancets lancets Use to check blood sugars 1-2 times daily. Dx:e11.9 05/04/20   Jordan, Betty G, MD  albuterol  (VENTOLIN  HFA) 108 [phone] Base) MCG/ACT inhaler INHALE 1 PUFF EVERY 4 HOURS AS NEEDED FOR WHEEZING OR SHORTNESS OF BREATH. 10/09/23   Jordan, Betty G, MD  atorvastatin  (LIPITOR ) 80 MG tablet TAKE 1 TABLET BY MOUTH EVERY DAY AT JEREL 11/06/23   Lonni Slain, MD  Blood Glucose Monitoring Suppl (ACCU-CHEK AVIVA PLUS) w/Device KIT Use to test blood sugars 1-2 times daily. Dx: E11.9 05/04/20   Jordan, Betty G, MD  cetirizine (ZYRTEC) 10 MG tablet Take 10 mg by mouth daily.    [provider]  dapagliflozin  propanediol (FARXIGA ) 10 MG TABS tablet Take 1 tablet (10 mg total) by mouth daily. 08/16/24   Jordan, Betty G, MD  escitalopram  (LEXAPRO ) 20 MG tablet TAKE 1 TABLET BY MOUTH EVERY DAY 04/03/24   Jordan, Betty G, MD  ezetimibe  (ZETIA ) 10 MG tablet Take 1 tablet (10 mg total) by mouth daily. 11/06/23   Lonni Slain, MD  famotidine  (PEPCID ) 20 MG tablet Take 1 tablet (20 mg total) by mouth daily as needed for heartburn. 03/27/24   Christobal Guadalajara, MD  ferrous gluconate  (FERGON) 324 MG tablet TAKE 1 TABLET BY MOUTH TWICE A DAY WITH MEALS 08/09/19   Jordan, Betty G, MD  furosemide  (LASIX ) 40 MG tablet TAKE 1 TABLET (40 MG TOTAL) BY MOUTH 3 (THREE) TIMES A WEEK. 01/05/24   Lonni Slain, MD  glucose blood (ACCU-CHEK GUIDE TEST) test strip USE TO CHECK BLOOD SUGARS 4 TIMES DAILY 01/02/24   Jordan, Betty G, MD  losartan  (COZAAR ) 25 MG tablet Take 0.5 tablets (12.5 mg total) by mouth daily. 05/17/24   Lonni Slain, MD  mometasone -formoterol  (DULERA ) 200-5 MCG/ACT AERO Inhale 2 puffs into the lungs daily. 08/16/24   Jordan, Betty G, MD  montelukast  (SINGULAIR ) 10 MG tablet Take 1 tablet (10 mg total) by mouth at bedtime. Patient not taking: Reported on 08/16/2024 03/25/20   Jordan, Betty G, MD  nebivolol  (BYSTOLIC ) 5 MG tablet Take 1 tablet (5 mg total) by mouth daily.  08/16/24   Jordan, Betty G, MD  nitroGLYCERIN  (NITROSTAT ) 0.4 MG SL tablet Place 1 tablet (0.4 mg total) under the tongue every 5 (five) minutes as needed for chest pain. 12/20/22   Jordan, Betty G, MD  rivaroxaban  (XARELTO ) 10 MG TABS tablet Take 1 tablet (10 mg total) by mouth daily. 03/22/24 08/16/24  Georgina Ozell LABOR, MD  spironolactone  (ALDACTONE ) 25 MG tablet Take 0.5 tablets (12.5 mg total) by mouth daily. 05/17/24   Lonni Slain, MD  Tiotropium Bromide  (SPIRIVA  RESPIMAT) 2.5 MCG/ACT AERS Inhale 1 puff into the lungs daily as needed. 08/16/24   Jordan, Betty G, MD  traMADol  (ULTRAM ) 50 MG tablet Take 1 tablet (50 mg total) by mouth every 8 (eight) hours as needed for up to 4 doses for moderate pain (pain score 4-6). 03/27/24   Christobal Guadalajara, MD  vitamin B-12 1000 MCG tablet Take 1 tablet (1,000 mcg total) by mouth daily.  12/06/17   Dennise Lavada POUR, MD    Physical Exam: Vitals:   08/17/24 1259 08/17/24 1315 08/17/24 1500 08/17/24 1515  BP:  (!) 155/54 138/68 126/60  Pulse:  (!) 109 (!) 107 (!) 109  Resp:  (!) 23 (!) 25 (!) 27  Temp:      TempSrc:      SpO2:  100% 100% 97%  Weight: 81.4 kg     Height: 5' 1 (1.549 m)      Physical Exam Nursing note (Physical exam performed by nursing  including cardiac, pumonary, abdominal, skin) reviewed.  Constitutional:      Appearance: She is obese.  HENT:     Head: Normocephalic and atraumatic.     Mouth/Throat:     Mouth: Mucous membranes are moist.  Eyes:     Pupils: Pupils are equal, round, and reactive to light.  Cardiovascular:     Rate and Rhythm: Normal rate.     Data Reviewed:  There are no new results to review at this time.  DG Chest Port 1 View EXAM: 1 VIEW(S) XRAY OF THE CHEST 08/17/2024 01:41:00 PM  COMPARISON: Comparison 04/08/2023.  CLINICAL HISTORY: Shortness of breath.  FINDINGS:  LUNGS AND PLEURA: Persistent relatively low lung volumes. Linear interstitial opacities in the lung bases left greater than  right slightly more conspicuous. Central bronchovascular crowding. No pleural effusion. No pneumothorax.  HEART AND MEDIASTINUM: Cardiomegaly. Calcified aorta.  BONES AND SOFT TISSUES: No acute osseous abnormality.  IMPRESSION: 1. Linear interstitial opacities in the lung bases, left greater than right, slightly more conspicuous, with central bronchovascular crowding. 2. Cardiomegaly.  Electronically signed by: Katheleen Faes MD 08/17/2024 02:13 PM EST RP Workstation: [id number]  Lab Results  Component Value Date   WBC 8.9 08/17/2024   HGB 8.8 (L) 08/17/2024   HGB 8.8 (L) 08/17/2024   HCT 26.0 (L) 08/17/2024   HCT 26.0 (L) 08/17/2024   MCV 99.2 08/17/2024   PLT 155 08/17/2024   Last metabolic panel Lab Results  Component Value Date   GLUCOSE 150 (H) 08/17/2024   NA 141 08/17/2024   NA 140 08/17/2024   K 4.5 08/17/2024   K 4.5 08/17/2024   CL 100 08/17/2024   CO2 29 08/17/2024   BUN 27 (H) 08/17/2024   CREATININE 1.80 (H) 08/17/2024   GFRNONAA 31 (L) 08/17/2024   CALCIUM  9.5 08/17/2024   PHOS 4.7 (H) 03/21/2024   PROT 6.7 08/17/2024   ALBUMIN  3.7 08/17/2024   LABGLOB 2.4 08/16/2024   BILITOT 0.3 08/17/2024   ALKPHOS 120 08/17/2024   AST 16 08/17/2024   ALT 7 08/17/2024   ANIONGAP 10 08/17/2024    Assessment and Plan: Acute on chronic respiratory failure with hypoxia (HCC) COPD Exacerbation  Acute on chronic HFrEF  Decompensated respiratory status now requiring 3 L nasal cannula in setting of baseline chronic 2 L nasal cannula use at home Overall presentation appears fairly mixed with contribution of acute on chronic heart failure as well as COPD exacerbation Requiring 3 days nasal cannula to keep O2 sats in the mid 90s  Chest x-ray with cardiomegaly and nondescript lung findings concerning for pulmonary edema-focal infiltrate not overtly noted at present Will continue with IV Lasix  for diuresis Weight today 81.4 kg Strict ins and outs and daily  weights Repeat 2D echo-consult cardiology for any significant deviation from baseline IV Solu-Medrol  Neb treatments Azithromycin  for respiratory coverage in setting of COPD flare VBG grossly stable-no acute indication for NIPPV at present Follow closely  HFrEF (heart failure with reduced ejection fraction) (HCC) 2D ECHO 03/2023 w/ EF 25-30%, grade 2 diastolic dysfunction, severe hypokinesis of the left ventricular, entire inferior wall and inferolateral wall, mild to moderate MR, mild AS Decompensated resp status now requiring 3L [place] (baseline 2L [place] O2 use chronically)  Pro BNP 5290 with cardiomegaly ? Volume overload on imaging  S/p IV lasix  in the ER  Will trend diuresis  Strict Is and Os and daily weights (weight today 81.4 kg)  Repeat 2D ECHO  Consult cardiology as appropriate   Acute encephalopathy + generalized lethargy on presentation  Noted decompensated resp status in setting volume overload and COPD exacerbation  VBG WNL  Nonfocal neuro exam per nursing evaluation  Will check ammonia level in setting of history of hyperammonemia  Add on UDS  Monitor mentation with treatment    CAD, multiple vessel Baseline CAD--recommended for CABG in 2015 with patient declining CABG Follows Dr. Shelda outpatient- last visit 10/2023  No reported active CP at present EKG stable  Trop in 30s  Baby ASA  Cont home regimen  Follow     Diabetes mellitus (HCC) Blood sugar in 150s  SSI  A1C  Monitor    Acute kidney injury superimposed on chronic kidney disease Baseline Cr around 1.2-1.4 w/ GFR in the 40s  Cr 1.7 today w/ GFR in the 20s  + concominant volume overload in setting of decompensated HFrEF  Monitor renal function w/ diuresis  Minimize nephrotoxic agents  Check FeNa to correlate  Renal imaging as clinically indicated    Iron  deficiency anemia Hgb 8.8 today with baseline hgb around 9  Near baseline  Trend        Advance Care Planning:   Code Status: Limited:  Do not attempt resuscitation (DNR) -DNR-LIMITED -Do Not Intubate/DNI    Consults: Palliative Care. Consider cardiology as appropriate   Family Communication: no family at the bedside   Severity of Illness: The appropriate patient status for this patient is OBSERVATION. Observation status is judged to be reasonable and necessary in order to provide the required intensity of service to ensure the patient's safety. The patient's presenting symptoms, physical exam findings, and initial radiographic and laboratory data in the context of their medical condition is felt to place them at decreased risk for further clinical deterioration. Furthermore, it is anticipated that the patient will be medically stable for discharge from the hospital within 2 midnights of admission.   Author: Elspeth JINNY Masters, MD 08/17/2024 3:47 PM  For on call review [email or web address].      [1] No Known Allergies  "

## 2024-08-17 NOTE — Assessment & Plan Note (Signed)
 2D ECHO 03/2023 w/ EF 25-30%, grade 2 diastolic dysfunction, severe hypokinesis of the left ventricular, entire inferior wall and inferolateral wall, mild to moderate MR, mild AS Decompensated resp status now requiring 3L [place] (baseline 2L [place] O2 use chronically)  Pro BNP 5290 with cardiomegaly ? Volume overload on imaging  S/p IV lasix  in the ER  Will trend diuresis  Strict Is and Os and daily weights (weight today 81.4 kg)  Repeat 2D ECHO  Consult cardiology as appropriate

## 2024-08-17 NOTE — Assessment & Plan Note (Signed)
 Hgb 8.8 today with baseline hgb around 9  Near baseline  Trend

## 2024-08-17 NOTE — Assessment & Plan Note (Signed)
 Blood sugar in 150s  SSI  A1C  Monitor

## 2024-08-18 ENCOUNTER — Observation Stay (HOSPITAL_COMMUNITY)

## 2024-08-18 ENCOUNTER — Ambulatory Visit: Payer: Self-pay | Admitting: Family Medicine

## 2024-08-18 ENCOUNTER — Ambulatory Visit (HOSPITAL_COMMUNITY): Payer: Self-pay

## 2024-08-18 DIAGNOSIS — N1832 Chronic kidney disease, stage 3b: Secondary | ICD-10-CM | POA: Diagnosis present

## 2024-08-18 DIAGNOSIS — E1169 Type 2 diabetes mellitus with other specified complication: Secondary | ICD-10-CM | POA: Diagnosis not present

## 2024-08-18 DIAGNOSIS — J449 Chronic obstructive pulmonary disease, unspecified: Secondary | ICD-10-CM | POA: Diagnosis not present

## 2024-08-18 DIAGNOSIS — J9601 Acute respiratory failure with hypoxia: Secondary | ICD-10-CM | POA: Diagnosis not present

## 2024-08-18 DIAGNOSIS — N179 Acute kidney failure, unspecified: Secondary | ICD-10-CM | POA: Diagnosis present

## 2024-08-18 DIAGNOSIS — I5020 Unspecified systolic (congestive) heart failure: Secondary | ICD-10-CM | POA: Diagnosis not present

## 2024-08-18 DIAGNOSIS — E1122 Type 2 diabetes mellitus with diabetic chronic kidney disease: Secondary | ICD-10-CM | POA: Diagnosis present

## 2024-08-18 DIAGNOSIS — I2510 Atherosclerotic heart disease of native coronary artery without angina pectoris: Secondary | ICD-10-CM | POA: Diagnosis present

## 2024-08-18 DIAGNOSIS — G9341 Metabolic encephalopathy: Secondary | ICD-10-CM | POA: Diagnosis present

## 2024-08-18 DIAGNOSIS — Z7951 Long term (current) use of inhaled steroids: Secondary | ICD-10-CM | POA: Diagnosis not present

## 2024-08-18 DIAGNOSIS — I130 Hypertensive heart and chronic kidney disease with heart failure and stage 1 through stage 4 chronic kidney disease, or unspecified chronic kidney disease: Secondary | ICD-10-CM | POA: Diagnosis present

## 2024-08-18 DIAGNOSIS — E875 Hyperkalemia: Secondary | ICD-10-CM | POA: Diagnosis present

## 2024-08-18 DIAGNOSIS — M7989 Other specified soft tissue disorders: Secondary | ICD-10-CM

## 2024-08-18 DIAGNOSIS — J9621 Acute and chronic respiratory failure with hypoxia: Secondary | ICD-10-CM | POA: Diagnosis present

## 2024-08-18 DIAGNOSIS — E669 Obesity, unspecified: Secondary | ICD-10-CM | POA: Diagnosis present

## 2024-08-18 DIAGNOSIS — Z79899 Other long term (current) drug therapy: Secondary | ICD-10-CM | POA: Diagnosis not present

## 2024-08-18 DIAGNOSIS — Z7901 Long term (current) use of anticoagulants: Secondary | ICD-10-CM | POA: Diagnosis not present

## 2024-08-18 DIAGNOSIS — N189 Chronic kidney disease, unspecified: Secondary | ICD-10-CM | POA: Diagnosis not present

## 2024-08-18 DIAGNOSIS — Z66 Do not resuscitate: Secondary | ICD-10-CM | POA: Diagnosis present

## 2024-08-18 DIAGNOSIS — F32A Depression, unspecified: Secondary | ICD-10-CM | POA: Diagnosis present

## 2024-08-18 DIAGNOSIS — Z9981 Dependence on supplemental oxygen: Secondary | ICD-10-CM | POA: Diagnosis not present

## 2024-08-18 DIAGNOSIS — I5023 Acute on chronic systolic (congestive) heart failure: Secondary | ICD-10-CM | POA: Diagnosis present

## 2024-08-18 DIAGNOSIS — E1165 Type 2 diabetes mellitus with hyperglycemia: Secondary | ICD-10-CM | POA: Diagnosis present

## 2024-08-18 DIAGNOSIS — D631 Anemia in chronic kidney disease: Secondary | ICD-10-CM | POA: Diagnosis present

## 2024-08-18 DIAGNOSIS — E785 Hyperlipidemia, unspecified: Secondary | ICD-10-CM | POA: Diagnosis present

## 2024-08-18 DIAGNOSIS — Z8249 Family history of ischemic heart disease and other diseases of the circulatory system: Secondary | ICD-10-CM | POA: Diagnosis not present

## 2024-08-18 DIAGNOSIS — J441 Chronic obstructive pulmonary disease with (acute) exacerbation: Secondary | ICD-10-CM | POA: Diagnosis present

## 2024-08-18 DIAGNOSIS — D509 Iron deficiency anemia, unspecified: Secondary | ICD-10-CM | POA: Diagnosis present

## 2024-08-18 DIAGNOSIS — Z1152 Encounter for screening for COVID-19: Secondary | ICD-10-CM | POA: Diagnosis not present

## 2024-08-18 DIAGNOSIS — Z515 Encounter for palliative care: Secondary | ICD-10-CM | POA: Diagnosis not present

## 2024-08-18 LAB — URINALYSIS, ROUTINE W REFLEX MICROSCOPIC
Bilirubin Urine: NEGATIVE
Glucose, UA: NEGATIVE mg/dL
Hgb urine dipstick: NEGATIVE
Ketones, ur: NEGATIVE mg/dL
Nitrite: NEGATIVE
Protein, ur: NEGATIVE mg/dL
Specific Gravity, Urine: 1.011 (ref 1.005–1.030)
pH: 5 (ref 5.0–8.0)

## 2024-08-18 LAB — COMPREHENSIVE METABOLIC PANEL WITH GFR
ALT: 8 U/L (ref 0–44)
ALT: 8 U/L (ref 0–44)
AST: 27 U/L (ref 15–41)
AST: 33 U/L (ref 15–41)
Albumin: 3.5 g/dL (ref 3.5–5.0)
Albumin: 3.5 g/dL (ref 3.5–5.0)
Alkaline Phosphatase: 101 U/L (ref 38–126)
Alkaline Phosphatase: 96 U/L (ref 38–126)
Anion gap: 6 (ref 5–15)
Anion gap: 8 (ref 5–15)
BUN: 26 mg/dL — ABNORMAL HIGH (ref 8–23)
BUN: 29 mg/dL — ABNORMAL HIGH (ref 8–23)
CO2: 34 mmol/L — ABNORMAL HIGH (ref 22–32)
CO2: 31 mmol/L (ref 22–32)
Calcium: 9.2 mg/dL (ref 8.9–10.3)
Calcium: 9.3 mg/dL (ref 8.9–10.3)
Chloride: 101 mmol/L (ref 98–111)
Chloride: 100 mmol/L (ref 98–111)
Creatinine, Ser: 1.8 mg/dL — ABNORMAL HIGH (ref 0.44–1.00)
Creatinine, Ser: 1.91 mg/dL — ABNORMAL HIGH (ref 0.44–1.00)
GFR, Estimated: 29 mL/min — ABNORMAL LOW
GFR, Estimated: 27 mL/min — ABNORMAL LOW
Glucose, Bld: 146 mg/dL — ABNORMAL HIGH (ref 70–99)
Glucose, Bld: 123 mg/dL — ABNORMAL HIGH (ref 70–99)
Potassium: 5.8 mmol/L — ABNORMAL HIGH (ref 3.5–5.1)
Potassium: 5.8 mmol/L — ABNORMAL HIGH (ref 3.5–5.1)
Sodium: 140 mmol/L (ref 135–145)
Sodium: 138 mmol/L (ref 135–145)
Total Bilirubin: 0.2 mg/dL (ref 0.0–1.2)
Total Bilirubin: 0.3 mg/dL (ref 0.0–1.2)
Total Protein: 6.1 g/dL — ABNORMAL LOW (ref 6.5–8.1)
Total Protein: 6.1 g/dL — ABNORMAL LOW (ref 6.5–8.1)

## 2024-08-18 LAB — CBC WITH DIFFERENTIAL/PLATELET
Abs Immature Granulocytes: 0.06 10*3/uL (ref 0.00–0.07)
Basophils Absolute: 0 10*3/uL (ref 0.0–0.1)
Basophils Relative: 0 %
Eosinophils Absolute: 0 10*3/uL (ref 0.0–0.5)
Eosinophils Relative: 0 %
HCT: 21.1 % — ABNORMAL LOW (ref 36.0–46.0)
Hemoglobin: 7.1 g/dL — ABNORMAL LOW (ref 12.0–15.0)
Immature Granulocytes: 1 %
Lymphocytes Relative: 8 %
Lymphs Abs: 0.8 10*3/uL (ref 0.7–4.0)
MCH: 31.8 pg (ref 26.0–34.0)
MCHC: 33.6 g/dL (ref 30.0–36.0)
MCV: 94.6 fL (ref 80.0–100.0)
Monocytes Absolute: 0.5 10*3/uL (ref 0.1–1.0)
Monocytes Relative: 5 %
Neutro Abs: 8.7 10*3/uL — ABNORMAL HIGH (ref 1.7–7.7)
Neutrophils Relative %: 86 %
Platelets: 148 10*3/uL — ABNORMAL LOW (ref 150–400)
RBC: 2.23 MIL/uL — ABNORMAL LOW (ref 3.87–5.11)
RDW: 13 % (ref 11.5–15.5)
WBC: 10.1 10*3/uL (ref 4.0–10.5)
nRBC: 0 % (ref 0.0–0.2)

## 2024-08-18 LAB — CBC
HCT: 22.1 % — ABNORMAL LOW (ref 36.0–46.0)
Hemoglobin: 7.2 g/dL — ABNORMAL LOW (ref 12.0–15.0)
MCH: 31 pg (ref 26.0–34.0)
MCHC: 32.6 g/dL (ref 30.0–36.0)
MCV: 95.3 fL (ref 80.0–100.0)
Platelets: 148 10*3/uL — ABNORMAL LOW (ref 150–400)
RBC: 2.32 MIL/uL — ABNORMAL LOW (ref 3.87–5.11)
RDW: 12.9 % (ref 11.5–15.5)
WBC: 7 10*3/uL (ref 4.0–10.5)
nRBC: 0 % (ref 0.0–0.2)

## 2024-08-18 LAB — ECHOCARDIOGRAM COMPLETE
Area-P 1/2: 4.39 cm2
Calc EF: 35.8 %
Height: 61 in
MV M vel: 4.23 m/s
MV Peak grad: 71.6 mmHg
Radius: 0.3 cm
S' Lateral: 4.8 cm
Single Plane A2C EF: 34.9 %
Single Plane A4C EF: 32.4 %
Weight: 2821.89 [oz_av]

## 2024-08-18 LAB — MAGNESIUM: Magnesium: 1.8 mg/dL (ref 1.7–2.4)

## 2024-08-18 LAB — GLUCOSE, CAPILLARY
Glucose-Capillary: 139 mg/dL — ABNORMAL HIGH (ref 70–99)
Glucose-Capillary: 108 mg/dL — ABNORMAL HIGH (ref 70–99)
Glucose-Capillary: 135 mg/dL — ABNORMAL HIGH (ref 70–99)
Glucose-Capillary: 122 mg/dL — ABNORMAL HIGH (ref 70–99)

## 2024-08-18 LAB — PREPARE RBC (CROSSMATCH)

## 2024-08-18 MED ORDER — SODIUM ZIRCONIUM CYCLOSILICATE 10 G PO PACK
10.0000 g | PACK | Freq: Once | ORAL | Status: AC
  Administered 2024-08-18: 10 g via ORAL
  Filled 2024-08-18: qty 1

## 2024-08-18 MED ORDER — BUDESONIDE 0.5 MG/2ML IN SUSP
0.5000 mg | Freq: Two times a day (BID) | RESPIRATORY_TRACT | Status: DC
  Administered 2024-08-18 – 2024-08-22 (×9): 0.5 mg via RESPIRATORY_TRACT
  Filled 2024-08-18 (×9): qty 2

## 2024-08-18 MED ORDER — SODIUM CHLORIDE 0.9% IV SOLUTION: Freq: Once | INTRAVENOUS | Status: DC

## 2024-08-18 MED ORDER — METHYLPREDNISOLONE SODIUM SUCC 125 MG IJ SOLR
80.0000 mg | Freq: Every day | INTRAMUSCULAR | Status: DC
  Administered 2024-08-19 – 2024-08-21 (×3): 80 mg via INTRAVENOUS
  Filled 2024-08-18 (×3): qty 2

## 2024-08-18 MED ORDER — METHYLPREDNISOLONE SODIUM SUCC 125 MG IJ SOLR
80.0000 mg | Freq: Every day | INTRAMUSCULAR | Status: DC
  Filled 2024-08-18: qty 2

## 2024-08-18 MED ORDER — PERFLUTREN LIPID MICROSPHERE
1.0000 mL | INTRAVENOUS | Status: AC | PRN
  Administered 2024-08-18: 2 mL via INTRAVENOUS

## 2024-08-18 NOTE — Progress Notes (Signed)
" °  Echocardiogram 2D Echocardiogram has been performed.  Analysse Quinonez 08/18/2024, 9:40 AM "

## 2024-08-18 NOTE — Plan of Care (Signed)

## 2024-08-18 NOTE — Consult Note (Signed)
 " Palliative Medicine Inpatient Consult Note  Consulting Provider: Eldonna Elspeth PARAS, MD   Reason for consult:   Palliative Care Consult Services Palliative Medicine Consult  Reason for Consult? goals of care   08/18/2024  HPI:  Per intake H&P -->   Beverly Wheeler is a 79 year old female with a past medical history significant for hypoxic respiratory failure on 2 L nasal cannula, COPD, coronary artery disease, congestive heart failure, chronic kidney disease stage IIIa, type 2 diabetes mellitus, hypertension, hyperlipidemia, iron  deficiency anemia.  Beverly Wheeler was admitted to the hospital with shortness of breath.  She is being treated for heart failure exacerbation as well as COPD exacerbation.  Palliative care has been asked to support additional goals of care conversations in the setting of chronic disease burden.  Clinical Assessment/Goals of Care:  I have reviewed medical records including EPIC notes from Dr. Eldonna, and Jeoffrey Glatter, RN, labs inclusive of BMP, CBC, urine analysis, and BNP from yesterday.  I have reviewed the chest x-ray from yesterday which shows interstitial edema, received report from bedside RN, assessed the patient who is lying in bed in no acute distress.    I met with Beverly Jerilee to further discuss diagnosis prognosis, GOC, EOL wishes, disposition and options.   I introduced Palliative Medicine as specialized medical care for people living with serious illness. It focuses on providing relief from the symptoms and stress of a serious illness. The goal is to improve quality of life for both the patient and the family.  Medical History Review and Understanding:  A review of Beverly Wheeler's past medical history completed inclusive of COPD, congestive heart failure, hypoxic respiratory failure requiring 2 L of oxygen  at baseline, chronic kidney disease, type 2 diabetes, hypertension, hyperlipidemia, and coronary artery disease.  Social History:  Beverly Wheeler shares that she is  from [place], [place] .  She is a widow as her husband died in Sep 03, 2010.  She has 1 son, Atlee who lives with her here in [place], and 1 stepson who lives in [place].  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  She is a woman of faith practicing within the [employer] denomination.  Functional and Nutritional State:  Preceding hospitalization Beverly Wheeler was living in a single-family home.  Her son Beverly Wheeler is her primary caregiver and attends to all BADLs for her.  She was able to start pivoting to chair with assistance.  She does have a front wheel walker at home however her ability to use it freely has been impaired by her recovery from a right foot surgical intervention in August of this year.  Advance Directives:  A detailed discussion was had today regarding advanced directives.  Beverly Wheeler does not have advanced directives though would be interested in completing those during hospitalization.  She shares her son Beverly Wheeler who is her primary caretaker would be someone who she would want to help make decisions for her.  She also shares that her cousin Beverly Wheeler and Beverly Wheeler would be surrogate decision makers if needed.  Code Status:  Concepts specific to code status, artifical feeding and hydration, continued IV antibiotics and rehospitalization was had.  The difference between a aggressive medical intervention path  and a palliative comfort care path for this patient at this time was had.   I confirmed with [name] that she is a DO NOT RESUSCITATE/DO NOT INTUBATE CODE STATUS.  She shares understanding that these interventions would likely cause her more trauma than benefit.  She would like to continue present interventions and rehospitalization's if needed for treatment of her heart failure.  We discussed what a MOST form is and completing one prior to discharge from the hospital which she is in agreement with.  A copy was left  at bedside for her to review.  Discussion:  Oneil and I reviewed the reasons associated with her hospitalization inclusive of her feeling more short of breath.  She and I discussed her chronic disease processes inclusive of COPD and congestive heart failure.  We reviewed that these are chronic progressive diseases and in order for them to be managed within the best capacity she must be compliant with medications, dietary restrictions, fluid restrictions, and lifestyle factors.  [name] shares that she had smoked prior to a heart attack that it occurred roughly a decade ago though she has since stopped smoking.  She notes that she also used to drink intermittently which she has also stopped doing.  We reviewed the interventions to address patient's congestive heart failure and how to manage this better in the future.  She feels much of her current condition is related to her lack of dietary restriction.  We discussed a nutritionist speaking to her about how to maintain low-sodium diet within her daily nutritional choices which she is very much amenable towards.  [name] shares that her goals are to improved to the point where she can get back home.  She shares that her foot surgery has taken her back quite a bit though she has started to rehabilitate to some degree from it and is hopeful to mobilize again in the near future.  If skilled nursing is needed for [facility] posthospitalization she would be agreeable to this.  Beverly Wheeler shares I can call and speak to her son about her conversation. ________________________________ Addendum:  I was able to call and update patient's son, Beverly Wheeler of the above conversation.  He is very much amenable to doing anything he can to help his mother's current clinical state.  He shares he was hopeful that she would continue to improve but does understand the severity of her heart failure and COPD and how this may impact her future.  He requests additional information so that he  may help in regards to her compliance to reduce potential exacerbations  future  Discussed the importance of continued conversation with family and their  medical providers regarding overall plan of care and treatment options, ensuring decisions are within the context of the patients values and GOCs.  Decision Maker: Kenealy,Zachary: Son, Emergency Contact: [phone] (Mobile)   SUMMARY OF RECOMMENDATIONS   DNAR/DNI --> have presented a MOST form which we will try to complete prior to discharge from the hospital  Open and honest conversations held in the setting of patient's acute on chronic disease burden  Discussed the chronic progressive nature of congestive heart failure and the importance of medication compliance, dietary restrictions, and lifestyle modification  Appreciate chaplain team helping to create advance directives  Appreciate PT and OT evaluations to assess home needs  Plan for ongoing support during hospitalization  Code Status/Advance Care Planning: DNAR/DNI  Palliative Prophylaxis:  Aspiration, Bowel Regimen, Delirium Protocol, Frequent Pain Assessment, Oral Care, Palliative Wound Care, and Turn Reposition  Additional Recommendations (Limitations, Scope, Preferences): Continue current care  Psycho-social/Spiritual:  Desire for further Chaplaincy support: Yes Additional Recommendations: Education on CHF and the progressive nature of the diease   Prognosis: 12 month mortality is elevated in the setting of acute hospitalization; chronic disease burden,  frailty  Discharge Planning: To be determined  Vitals:   08/18/24 0746 08/18/24 0755  BP:  115/76  Pulse:  75  Resp:  20  Temp:  98.8 F (37.1 C)  SpO2: 96%     Intake/Output Summary (Last 24 hours) at 08/18/2024 0808 Last data filed at 08/18/2024 0331 Gross per 24 hour  Intake 360 ml  Output 500 ml  Net -140 ml   Last Weight  Most recent update: 08/18/2024  3:31 AM    Weight  80 kg (176 lb 5.9 oz)             LABS: CBC:    Component Value Date/Time   WBC 7.0 08/18/2024 0301   HGB 7.2 (L) 08/18/2024 0301   HGB 8.3 (L) 08/16/2024 1614   HCT 22.1 (L) 08/18/2024 0301   HCT 22.6 (L) 08/16/2024 1614   PLT 148 (L) 08/18/2024 0301   PLT 164 08/16/2024 1614   MCV 95.3 08/18/2024 0301   MCV 102 (H) 08/16/2024 1614   NEUTROABS 5.8 08/17/2024 1315   LYMPHSABS 2.2 08/17/2024 1315   MONOABS 0.5 08/17/2024 1315   EOSABS 0.3 08/17/2024 1315   BASOSABS 0.1 08/17/2024 1315   Comprehensive Metabolic Panel:    Component Value Date/Time   NA 140 08/18/2024 0301   NA 141 08/16/2024 1614   K 5.8 (H) 08/18/2024 0301   CL 101 08/18/2024 0301   CO2 34 (H) 08/18/2024 0301   BUN 26 (H) 08/18/2024 0301   BUN 24 08/16/2024 1614   CREATININE 1.80 (H) 08/18/2024 0301   CREATININE 1.07 (H) 04/16/2020 0901   GLUCOSE 146 (H) 08/18/2024 0301   CALCIUM  9.2 08/18/2024 0301   AST 27 08/18/2024 0301   ALT 8 08/18/2024 0301   ALKPHOS 101 08/18/2024 0301   BILITOT 0.2 08/18/2024 0301   BILITOT <0.2 08/16/2024 1614   PROT 6.1 (L) 08/18/2024 0301   PROT 6.3 08/16/2024 1614   ALBUMIN  3.5 08/18/2024 0301   ALBUMIN  3.9 08/16/2024 1614   Gen:  Elderly Caucasian F chronically ill in appearance HEENT: moist mucous membranes CV: Regular rate and rhythm  PULM:  On 2LPM [place], breathing is nonlabored ABD: soft/nontender  EXT: RLE edema more pronounced than LLE Neuro: Alert and oriented x3  PPS: 30%   This conversation/these recommendations were discussed with patient primary care team, Dr. Cheryle ______________________________________________________ [name] [facility] Palliative Medicine Team Team Cell Phone: [phone] Please utilize secure chat with additional questions, if there is no response within 30 minutes please call the above phone number  I personally spent a total of 75 minutes in the care of the patient today including preparing to see the patient, getting/reviewing  separately obtained history, performing a medically appropriate exam/evaluation, counseling and educating, placing orders, referring and communicating with other health care professionals, documenting clinical information in the EHR, independently interpreting results, communicating results, and coordinating care.  "

## 2024-08-18 NOTE — Care Management (Signed)
 SDOH resources for social isolation added to AVS

## 2024-08-18 NOTE — Progress Notes (Signed)
 BLE venous duplex has been completed.   Results can be found under chart review under CV PROC. 08/18/2024 2:09 PM Maleeya Peterkin RVT, RDMS

## 2024-08-18 NOTE — Plan of Care (Signed)
 Reoriented patient around 1400 and patient has been oriented back to location and situation. Although she was still having minor visual hallucinations, and calling out for her son, [name]. Also curious about various sites, like the closet, and if they let people go in and out there. Problem: Education: Goal: Ability to describe self-care measures that may prevent or decrease complications (Diabetes Survival Skills Education) will improve Outcome: Progressing   Problem: Coping: Goal: Ability to adjust to condition or change in health will improve Outcome: Progressing

## 2024-08-18 NOTE — Care Management Obs Status (Signed)
 MEDICARE OBSERVATION STATUS NOTIFICATION   Patient Details  Name: Beverly Wheeler MRN: [id number] Date of Birth: 30-Mar-1946   Medicare Observation Status Notification Given:  No (Patient with visual hallucinations per the nurse and the family's number is disconnected)  Unable to reach primary contact  [facility], Emergency Contact [phone]   Marval Gell, RN 08/18/2024, 10:36 AM

## 2024-08-18 NOTE — Discharge Instructions (Signed)
 Social Connections Resources   Active Adults Program [email or web address]  -Adding Health to Our Years Courses  -Aquatics  -Exercise Classes: Yoga, Dance, [place], etc.  -Book Club, Games  Locations vary by activity - Main Contact # [phone]) - see calendars on website listed above.  Senior Centers -[facility] [address] [place], [place] [address] / [phone] ext [phone] -[facility] [address], [place] [address] / [phone] [facility] [address] [facility]) [address], [place], [place] [address] / [phone] [facility] locations in [place], [place], and [place] / [phone] -[facility] [address] [place], [place] [address] / [phone]  [facility] ([facility]) [address] [place], [place] [address] - Office #: [phone] - Enrollment Phone #: [phone]  [facility] Line  Call toll free, available 24 hours a day, [phone]   [address]  [place] 2-1-1 is another useful way to locate resources in the community. Visit shedsizes.ch to find service information online. If you need additional assistance, 2-1-1 Referral Specialists are available 24 hours a day, every day by dialing 2-1-1 or [phone] from any phone. The call is free, confidential, and available in any language.  ______________________________________________________________________________________________________________________________________________________________________  High-Calorie, High-Protein Nutrition Therapy (2021) A high-calorie, high-protein diet has been recommended to you. Your registered dietitian nutritionist (RDN) may have recommended this diet because you are having difficulty eating enough calories throughout the day, you have lost weight, and/or you need to  add protein to your diet. Sometimes you may not feel like eating, even if you know the importance of good nutrition. The recommendations in this handout can help you with the following: Regaining your strength and energy Keeping your body healthy Healing and recovering from surgery or illness and fighting infection Tips: Schedule Your Meals and Snacks Several small meals and snacks are often better tolerated and digested than large meals. Strategies Plan to eat 3 meals and 3 snacks daily. Experiment with timing meals to find out when you have a larger appetite. Appetite may be greatest in the morning after not eating all night so you may prefer to eat your larger meals and snacks in the morning and at lunch. Breakfast-type foods are often better tolerated so eat foods such as eggs, pancakes, waffles and cereal for any meal or snack. Carry snacks with you so you are prepared to eat every 2 to 3 hours. Determine what works best for you if your bodys cues for feeling hungry or full are not working. Eat a small meal or snack even if you dont feel hungry. Set a timer to remind you when it is time to eat. Take a walk before you eat (with health care providers approval). Light or moderate physical activity can help you maintain muscle and increase your appetite. Make Eating Enjoyable Taking steps to make the experience enjoyable may help to increase your interest in eating and improve your appetite. Strategies: Eat with others whenever possible. Include your favorite foods to make meals more enjoyable. Try new foods. Save your beverage for the end of the meal so that you have more room for food before you get full. Add Calories to Your Meals and Snacks Try adding calorie-dense foods so that each bite provides more nutrition. Strategies Drink milk, chocolate milk, soy milk, or smoothies instead of low-calorie beverages such as diet drinks or water. Cook with milk or soy milk instead of water  when making  dishes such as hot cereal, cocoa, or pudding. Add jelly, jam, honey, butter or margarine to bread and crackers. Add jam or fruit to ice cream and as a topping over cake. Mix dried fruit, nuts, granola, honey, or dry cereal with yogurt or hot cereals. Enjoy snacks such as milkshakes, smoothies, pudding, ice cream, or custard. Blend a fruit smoothie of a banana, frozen berries, milk or soy milk, and 1 tablespoon nonfat powdered milk or protein powder. Add Protein to Your Meals and Snacks Choose at least one protein food at each meal and snack to increase your daily intake. Strategies Add  cup nonfat dry milk powder or protein powder to make a high-protein milk to drink or to use in recipes that call for milk. Vanilla or peppermint extract or unsweetened cocoa powder could help to boost the flavor. Add hard-cooked eggs, leftover meat, grated cheese, canned beans or tofu to noodles, rice, salads, sandwiches, soups, casseroles, pasta, tuna and other mixed dishes. Add powdered milk or protein powder to hot cereals, meatloaf, casseroles, scrambled eggs, sauces, cream soups, and shakes. Add beans and lentils to salads, soups, casseroles, and vegetable dishes. Eat cottage cheese or yogurt, especially Greek yogurt, with fruit as a snack or dessert. Eat peanut or other nut butters on crackers, bread, toast, waffles, apples, bananas or celery sticks. Add it to milkshakes, smoothies, or desserts. Consider a ready-made protein shake. Your RDN will make recommendations. Add Fats to Your Meals and Snacks Try adding fats to your meals and snacks. Fat provides more calories in fewer bites than carbohydrate or protein and adds flavors to your foods. Strategies Snack on nuts and seeds or add them to foods like salads, pasta, cereals, yogurt, and ice cream.  Saut or stir-fry vegetables, meats, chicken, fish or tofu in olive or canola oil.  Add olive oil, other vegetable oils, butter or margarine to  soups, vegetables, potatoes, cooked cereal, rice, pasta, bread, crackers, pancakes, or waffles. Snack on olives or add to pasta, pizza, or salad. Add avocado or guacamole to your salads, sandwiches, and other entrees. Include fatty fish such as salmon in your weekly meal plan. For general food safety tips, especially for clients with immunocompromised conditions, ask your RDN for the Food Safety Nutrition Therapy handout. Small Meal and Snack Ideas These snacks and meals are recommended when you have to eat but arent necessarily hungry.  They are good choices because they are high in protein and high in calories.  2 graham crackers 2 tablespoons peanut or other nut butter 1 cup milk 2 slices whole wheat toast topped with:  avocado, mashed Seasoning of your choice   cup Greek yogurt  cup fruit  cup granola 2 deviled egg halves 5 whole wheat crackers  1 cup cream of tomato soup  grilled cheese sandwich 1 toasted waffle topped with: 2 tablespoons peanut or nut butter 1 tablespoon jam  Trail mix made with:  cup nuts  cup dried fruit  cup cold cereal, any variety  cup oatmeal or cream of wheat cereal 1 tablespoon peanut or nut butter  cup diced fruit   High-Calorie, High-Protein Sample 1-Day Menu View Nutrient Info Breakfast 1 egg, scrambled 1 ounce cheddar cheese 1 English muffin, whole wheat 1 tablespoon margarine 1 tablespoon jam  cup orange juice, fortified with calcium  and vitamin D   Morning Snack 1 tablespoon peanut butter 1 banana 1 cup 1% milk  Lunch Tuna salad sandwich made with: 2 slices bread, whole wheat 3 ounces tuna mixed with: 1  tablespoon mayonnaise  cup pudding  Afternoon Snack  cup hummus  cup carrots 1 pita  Evening Meal Enchilada casserole made with: 2 corn tortillas 3 ounces ground beef, cooked  cup black beans, cooked  cup corn, cooked 1 ounce grated cheddar cheese  cup enchilada sauce  avocado, sliced, topping for enchilada 1  tablespoon sour cream, topping for enchilada Salad:  cup lettuce, shredded  cup tomatoes, chopped, for salad 1 tablespoon olive oil and vinegar dressing, for salad  Evening Snack  cup Greek yogurt  cup blueberries  cup granola   ______________________________________________________________________________________________________________________________________________________________________    Low Sodium Nutrition Therapy  Eating less sodium can help you if you have high blood pressure, heart failure, or kidney or liver disease.   Your body needs a little sodium, but too much sodium can cause your body to hold onto extra water. This extra water will raise your blood pressure and can cause damage to your heart, kidneys, or liver as they are forced to work harder.   Sometimes you can see how the extra fluid affects you because your hands, legs, or belly swell. You may also hold water around your heart and lungs, which makes it hard to breathe.   Even if you take medication for blood pressure or a water pill (diuretic) to remove fluid, it is still important to have less salt in your diet.   Check with your primary care provider before drinking alcohol since it may affect the amount of fluid in your body and how your heart, kidneys, or liver work.  Sodium in Food A low-sodium meal plan limits the sodium that you get from food and beverages to 1,500-2,000 milligrams (mg) per day. Salt is the main source of sodium. Read the nutrition label on the package to find out how much sodium is in one serving of a food.  Select foods with 140 milligrams (mg) of sodium or less per serving.  You may be able to eat one or two servings of foods with a little more than 140 milligrams (mg) of sodium if you are closely watching how much sodium you eat in a day.  Check the serving size on the label. The amount of sodium listed on the label shows the amount in one serving of the food. So, if you eat more  than one serving, you will get more sodium than the amount listed.  Tips Cutting Back on Sodium Eat more fresh foods.  Fresh fruits and vegetables are low in sodium, as well as frozen vegetables and fruits that have no added juices or sauces.  Fresh meats are lower in sodium than processed meats, such as bacon, sausage, and hotdogs.  Not all processed foods are unhealthy, but some processed foods may have too much sodium.  Eat less salt at the table and when cooking. One of the ingredients in salt is sodium.  One teaspoon of table salt has 2,300 milligrams of sodium.  Leave the salt out of recipes for pasta, casseroles, and soups. Be a engineer, building services.  Food packages that say Salt-free, sodium-free, very low sodium, and low sodium have less than 140 milligrams of sodium per serving.  Beware of products identified as Unsalted, No Salt Added, Reduced Sodium, or Lower Sodium. These items may still be high in sodium. You should always check the nutrition label. Add flavors to your food without adding sodium.  Try lemon juice, lime juice, or vinegar.  Dry or fresh herbs add flavor.  Buy a sodium-free seasoning blend  or make your own at home. You can purchase salt-free or sodium-free condiments like barbeque sauce in stores and online. Ask your registered dietitian nutritionist for recommendations and where to find them.   Eating in Restaurants Choose foods carefully when you eat outside your home. Restaurant foods can be very high in sodium. Many restaurants provide nutrition facts on their menus or their websites. If you cannot find that information, ask your server. Let your server know that you want your food to be cooked without salt and that you would like your salad dressing and sauces to be served on the side.    Foods Recommended Food Group Foods Recommended  Grains Bread, bagels, rolls without salted tops Homemade bread made with reduced-sodium baking powder Cold cereals,  especially shredded wheat and puffed rice Oats, grits, or cream of wheat Pastas, quinoa, and rice Popcorn, pretzels or crackers without salt Corn tortillas  Protein Foods Fresh meats and fish; turkey bacon (check the nutrition labels - make sure they are not packaged in a sodium solution) Canned or packed tuna (no more than 4 ounces at 1 serving) Beans and peas Soybeans) and tofu Eggs Nuts or nut butters without salt  Dairy Milk or milk powder Plant milks, such as rice and soy Yogurt, including Greek yogurt Small amounts of natural cheese (blocks of cheese) or reduced-sodium cheese can be used in moderation. (Swiss, ricotta, and fresh mozzarella cheese are lower in sodium than the others) Cream Cheese Low sodium cottage cheese  Vegetables Fresh and frozen vegetables without added sauces or salt Homemade soups (without salt) Low-sodium, salt-free or sodium-free canned vegetables and soups  Fruit Fresh and canned fruits Dried fruits, such as raisins, cranberries, and prunes  Oils Tub or liquid margarine, regular or without salt Canola, corn, peanut, olive, safflower, or sunflower oils  Condiments Fresh or dried herbs such as basil, bay leaf, dill, mustard (dry), nutmeg, paprika, parsley, rosemary, sage, or thyme.  Low sodium ketchup Vinegar  Lemon or lime juice Pepper, red pepper flakes, and cayenne. Hot sauce contains sodium, but if you use just a drop or two, it will not add up to much.  Salt-free or sodium-free seasoning mixes and marinades Simple salad dressings: vinegar and oil   Foods Not Recommended Food Group Foods Not Recommended  Grains Breads or crackers topped with salt Cereals (hot/cold) with more than 300 mg sodium per serving Biscuits, cornbread, and other quick breads prepared with baking soda Pre-packaged bread crumbs Seasoned and packaged rice and pasta mixes Self-rising flours  Protein Foods Cured meats: Bacon, ham, sausage, pepperoni and hot dogs Canned  meats (chili, vienna sausage, or sardines) Smoked fish and meats Frozen meals that have more than 600 mg of sodium per serving Egg substitute (with added sodium)  Dairy Buttermilk Processed cheese spreads Cottage cheese (1 cup may have over 500 mg of sodium; look for low-sodium.) American or feta cheese Shredded Cheese has more sodium than blocks of cheese String cheese  Vegetables Canned vegetables (unless they are salt-free, sodium-free or low sodium) Frozen vegetables with seasoning and sauces Sauerkraut and pickled vegetables Canned or dried soups (unless they are salt-free, sodium-free, or low sodium) French fries and onion rings  Fruit Dried fruits preserved with additives that have sodium  Oils Salted butter or margarine, all types of olives  Condiments Salt, sea salt, kosher salt, onion salt, and garlic salt Seasoning mixes with salt Bouillon cubes Ketchup Barbeque sauce and Worcestershire sauce unless low sodium Soy sauce Salsa, pickles, olives, relish  Salad dressings: ranch, blue cheese, Italian, and French.   Low Sodium Sample 1-Day Menu  Breakfast 1 cup cooked oatmeal  1 slice whole wheat bread toast  1 tablespoon peanut butter without salt  1 banana  1 cup 1% milk  Lunch Tacos made with: 2 corn tortillas   cup black beans, low sodium   cup roasted or grilled chicken (without skin)   avocado  Squeeze of lime juice  1 cup salad greens  1 tablespoon low-sodium salad dressing   cup strawberries  1 orange  Afternoon Snack 1/3 cup grapes  6 ounces yogurt  Evening Meal 3 ounces herb-baked fish  1 baked potato  2 teaspoons olive oil   cup cooked carrots  2 thick slices tomatoes on:  2 lettuce leaves  1 teaspoon olive oil  1 teaspoon balsamic vinegar  1 cup 1% milk  Evening Snack 1 apple   cup almonds without salt   Low-Sodium Vegetarian (Lacto-Ovo) Sample 1-Day Menu  Breakfast 1 cup cooked oatmeal  1 slice whole wheat toast  1 tablespoon peanut  butter without salt  1 banana  1 cup 1% milk  Lunch Tacos made with: 2 corn tortillas   cup black beans, low sodium   cup roasted or grilled chicken (without skin)   avocado  Squeeze of lime juice  1 cup salad greens  1 tablespoon low-sodium salad dressing   cup strawberries  1 orange  Evening Meal Stir fry made with:  cup tofu  1 cup brown rice   cup broccoli   cup green beans   cup peppers   tablespoon peanut oil  1 orange  1 cup 1% milk  Evening Snack 4 strips celery  2 tablespoons hummus  1 hard-boiled egg   Low-Sodium Vegan Sample 1-Day Menu  Breakfast 1 cup cooked oatmeal  1 tablespoon peanut butter without salt  1 cup blueberries  1 cup soymilk fortified with calcium , vitamin B12, and vitamin D   Lunch 1 small whole wheat pita   cup cooked lentils  2 tablespoons hummus  4 carrot sticks  1 medium apple  1 cup soymilk fortified with calcium , vitamin B12, and vitamin D   Evening Meal Stir fry made with:  cup tofu  1 cup brown rice   cup broccoli   cup green beans   cup peppers   tablespoon peanut oil  1 cup cantaloupe  Evening Snack 1 cup soy yogurt   cup mixed nuts  Copyright 2020  Academy of Nutrition and Dietetics. All rights reserved  Sodium Free Flavoring Tips  When cooking, the following items may be used for flavoring instead of salt or seasonings that contain sodium. Remember: A little bit of spice goes a long way! Be careful not to overseason. Spice Blend Recipe (makes about ? cup) 5 teaspoons onion powder  2 teaspoons garlic powder  2 teaspoons paprika  2 teaspoon dry mustard  1 teaspoon crushed thyme leaves   teaspoon white pepper   teaspoon celery seed Food Item Flavorings  Beef Basil, bay leaf, caraway, curry, dill, dry mustard, garlic, grape jelly, green pepper, mace, marjoram, mushrooms (fresh), nutmeg, onion or onion powder, parsley, pepper, rosemary, sage  Chicken Basil, cloves, cranberries, mace, mushrooms (fresh),  nutmeg, oregano, paprika, parsley, pineapple, saffron, sage, savory, tarragon, thyme, tomato, turmeric  Egg Chervil, curry, dill, dry mustard, garlic or garlic powder, green pepper, jelly, mushrooms (fresh), nutmeg, onion powder, paprika, parsley, rosemary, tarragon, tomato  Fish Basil, bay leaf, chervil, curry, dill, dry mustard,  green pepper, lemon juice, marjoram, mushrooms (fresh), paprika, pepper, tarragon, tomato, turmeric  Lamb Cloves, curry, dill, garlic or garlic powder, mace, mint, mint jelly, onion, oregano, parsley, pineapple, rosemary, tarragon, thyme  Pork Applesauce, basil, caraway, chives, cloves, garlic or garlic powder, onion or onion powder, rosemary, thyme  Veal Apricots, basil, bay leaf, currant jelly, curry, ginger, marjoram, mushrooms (fresh), oregano, paprika  Vegetables Basil, dill, garlic or garlic powder, ginger, lemon juice, mace, marjoram, nutmeg, onion or onion powder, tarragon, tomato, sugar or sugar substitute, salt-free salad dressing, vinegar  Desserts Allspice, anise, cinnamon, cloves, ginger, mace, nutmeg, vanilla extract, other extracts   Copyright 2020  Academy of Nutrition and Dietetics. All rights reserved  Fluid Restricted Nutrition Therapy  You have been prescribed this diet because your condition affects how much fluid you can eat or drink. If your heart, liver, or kidneys aren't working properly, you may not be able to effectively eliminate fluids from the body and this may cause swelling (edema) in the legs, arms, and/or stomach. You don't need to stop eating or drinking the same fluids you normally would, but you may need to eat or drink less than usual.     Tips What Are Fluids?  A fluid is anything that is liquid or anything that would melt if left at room temperature. You will need to count these foods and liquids--including any liquid used to take medication--as part of your daily fluid intake. Some examples are: Alcohol (drink only with your  doctor's permission)  Coffee, tea, and other hot beverages  Gelatin (Jell-O)  Gravy  Ice cream, sherbet, sorbet  Ice cubes, ice chips  Milk, liquid creamer  Nutritional supplements  Popsicles  Vegetable and fruit juices; fluid in canned fruit  Watermelon  Yogurt  Soft drinks, lemonade, limeade  Soups  Syrup How Do I Measure My Fluid Intake? Record your fluid intake daily.  Tip: Every day, each time you eat or drink fluids, pour water in the same amount into an empty container that can hold the same amount of fluids you are allowed daily. This may help you keep track of how much fluid you are taking in throughout the day.  To accurately keep track of how much liquid you take in, measure the size of the cups, glasses, and bowls you use. If you eat soup, measure how much of it is liquid and how much is solid (such as noodles, vegetables, meat). Conversions for Measuring Fluid Intake  Milliliters (mL) Liters (L) Ounces (oz) Cups (c)  1000 1 32 4  1200 1.2 40 5  1500 1.5 50 6 1/4  1800 1.8 60 7 1/2  2000 2 67 8 1/3  Tips to Reduce Your Thirst Chew gum or suck on hard candy.  Rinse or gargle with mouthwash. Do not swallow.  Ice chips or popsicles my help quench thirst, but this too needs to be calculated into the total restriction. Melt ice chips or cubes first to figure out how much fluid they produce (for example, experiment with melting  cup ice chips or 2 ice cubes).  Add a lemon wedge to your water.  Limit how much salt you take in. A high salt intake might make you thirstier.  Don't eat or drink all your allowed liquids at once. Space your liquids out through the day.  Use small glasses and cups and sip slowly. If allowed, take your medications with fluids you eat or drink during a meal.   Fluid-Restricted Nutrition Therapy Sample 1-Day  Menu  Breakfast 1 slice wheat toast  1 tablespoon peanut butter  1/2 cup yogurt (120 milliliters)  1/2 cup blueberries  1 cup milk (240  milliliters)   Lunch 3 ounces sliced turkey  2 slices whole wheat bread  1/2 cup lettuce for sandwich  2 slices tomato for sandwich  1 ounce reduced-fat, reduced-sodium cheese  1/2 cup fresh carrot sticks  1 banana  1 cup unsweetened tea (240 milliliters)   Evening Meal 8 ounces soup (240 milliliters)  3 ounces salmon  1/2 cup quinoa  1 cup green beans  1 cup mixed greens salad  1 tablespoon olive oil  1 cup coffee (240 milliliters)  Evening Snack 1/2 cup sliced peaches  1/2 cup frozen yogurt (120 milliliters)  1 cup water (240 milliliters)  Copyright 2020  Academy of Nutrition and Dietetics. All rights reserved

## 2024-08-18 NOTE — Progress Notes (Signed)
 " PROGRESS NOTE    Beverly Wheeler  FMW:[id number] DOB: 02-Aug-1945 DOA: 08/17/2024 PCP: Jordan, Betty G, MD   Brief Narrative:   79 y.o. female with medical history significant of chronic hypoxic respiratory failure 2 L nasal cannula, COPD, CAD, CHF with reduced EF, CKD 3A, HTN, HLD, DM 2, iron  deficiency anemia and hospitalization from 03/20/2024 to 03/26/2024 for right distal tib-fib fracture requiring ORIF presented with worsening shortness of breath.  On presentation, she was satting in the mid 90s on 3 L nasal cannula.  Creatinine of 1.71, proBNP of 5290.  Chest x-ray showed central bronchovascular crowding and cardiomegaly.  She was started on IV Lasix  and steroids.  Assessment & Plan:   Acute on chronic respiratory failure with hypoxia COPD exacerbation Acute on chronic systolic CHF -Wears 2 L oxygen  via nasal cannula at home.  Required 3 L on presentation.  Respiratory status improving: Currently on 2 L oxygen . - Continue IV Lasix .  Strict input and output.  Daily weights.  Continue Nebivolol  - Await 2D echo report.  Might need inpatient cardiology evaluation depending on 2D echo report - Continue steroids.  Continue current nebs and oral Zithromax .  Acute metabolic encephalopathy - Patient was lethargic on presentation.  Possibly from above.  Mental status much improved.  Monitor mental status.  Multivessel CAD Positive troponins Hypertension Hyperlipidemia - No chest pain.  CABG was recommended in 2015 by cardiology but patient declined CABG.  Currently no chest pain.  Outpatient follow-up with cardiology.  Continue statin, ezetimibe , Nebivolol  and Lasix  - High sensitive troponins did not trend up.  Diabetes mellitus type 2 with hyperglycemia - A1c 5.4.  Continue CBGs with SSI  AKI on CKD stage IIIb - Patient creatinine of 1.2-1.4.  Creatinine 1.71 on presentation.  Possibly from volume overload.  Creatinine 1.8 today.  Monitor  Hyperkalemia - Questionable cause.  DC  supplemental potassium.  Repeat potassium level to verify.  Might need Lokelma .  Monitor.  Anemia of chronic disease Chronic from chronic illnesses.  Hemoglobin stable.  Monitor intermittently  Anxiety/depression - Continue escitalopram   Iron  deficiency anemia - Baseline hemoglobin of 8-9.  Hemoglobin 7.2 this morning.  Repeat hemoglobin is 7.1.  Will transfuse 1 unit of packed red cells.  Hold Lovenox   Physical deconditioning - PT eval  Goals of care - Palliative care consulted for goals of care discussion  DVT prophylaxis: SCDs.  DC Lovenox  Code Status: DNR Family Communication: None at bedside Disposition Plan: Status is: Observation The patient will require care spanning > 2 midnights and should be moved to inpatient because: Of severity of illness    Consultants: Palliative care  Procedures: 2D echo pending  Antimicrobials: Zithromax  from 08/17/2024 onwards   Subjective: Patient seen and examined at bedside.  Feels slightly better.  Still short of breath with exertion and feels weak.  Does not feel ready to go home today.  No chest pain reported.  Objective: Vitals:   08/18/24 0134 08/18/24 0326 08/18/24 0746 08/18/24 0755  BP:  (!) 114/48  115/76  Pulse:  82  75  Resp:  18  20  Temp:  98 F (36.7 C)  98.8 F (37.1 C)  TempSrc:  Oral  Oral  SpO2: 94% 92% 96%   Weight:  80 kg    Height:        Intake/Output Summary (Last 24 hours) at 08/18/2024 1043 Last data filed at 08/18/2024 0331 Gross per 24 hour  Intake 360 ml  Output 500 ml  Net -  140 ml   Filed Weights   08/17/24 1259 08/18/24 0326  Weight: 81.4 kg 80 kg    Examination:  General exam: Appears calm and comfortable.  Chronically ill and deconditioned looking Respiratory system: Bilateral decreased breath sounds at bases with scattered crackles Cardiovascular system: S1 & S2 heard, Rate controlled Gastrointestinal system: Abdomen is nondistended, soft and nontender. Normal bowel sounds  heard. Extremities: No cyanosis, clubbing; mild lower extremity edema Central nervous system: Alert and oriented. No focal neurological deficits. Moving extremities Skin: No rashes, lesions or ulcers Psychiatry: Flat affect.  Not agitated   Data Reviewed: I have personally reviewed following labs and imaging studies  CBC: Recent Labs  Lab 08/16/24 1614 08/17/24 1315 08/17/24 1323 08/18/24 0301  WBC 8.8 8.9  --  7.0  NEUTROABS  --  5.8  --   --   HGB 8.3* 8.4* 8.8*  8.8* 7.2*  HCT 22.6* 26.1* 26.0*  26.0* 22.1*  MCV 102* 99.2  --  95.3  PLT 164 155  --  148*   Basic Metabolic Panel: Recent Labs  Lab 08/16/24 1614 08/17/24 1315 08/17/24 1323 08/18/24 0301  NA 141 140 140  141 140  K 5.7* 4.5 4.5  4.5 5.8*  CL 101 102 100 101  CO2 25 29  --  34*  GLUCOSE 123* 150* 150* 146*  BUN 24 22 27* 26*  CREATININE 1.83* 1.71* 1.80* 1.80*  CALCIUM  9.1 9.5  --  9.2   GFR: Estimated Creatinine Clearance: 25.5 mL/min (A) (by C-G formula based on SCr of 1.8 mg/dL (H)). Liver Function Tests: Recent Labs  Lab 08/16/24 1614 08/17/24 1315 08/18/24 0301  AST 8 16 27   ALT 5 7 8   ALKPHOS 124 120 101  BILITOT <0.2 0.3 0.2  PROT 6.3 6.7 6.1*  ALBUMIN  3.9 3.7 3.5   No results for input(s): LIPASE, AMYLASE in the last 168 hours. Recent Labs  Lab 08/17/24 1758  AMMONIA 21   Coagulation Profile: No results for input(s): INR, PROTIME in the last 168 hours. Cardiac Enzymes: No results for input(s): CKTOTAL, CKMB, CKMBINDEX, TROPONINI in the last 168 hours. BNP (last 3 results) Recent Labs    08/17/24 1315  PROBNP 5,290.0*   HbA1C: Recent Labs    08/16/24 1607  HGBA1C 5.4   CBG: Recent Labs  Lab 08/17/24 1712 08/17/24 2125 08/18/24 0557  GLUCAP 215* 158* 139*   Lipid Profile: No results for input(s): CHOL, HDL, LDLCALC, TRIG, CHOLHDL, LDLDIRECT in the last 72 hours. Thyroid  Function Tests: No results for input(s): TSH, T4TOTAL,  FREET4, T3FREE, THYROIDAB in the last 72 hours. Anemia Panel: No results for input(s): VITAMINB12, FOLATE, FERRITIN, TIBC, IRON , RETICCTPCT in the last 72 hours. Sepsis Labs: No results for input(s): PROCALCITON, LATICACIDVEN in the last 168 hours.  Recent Results (from the past 240 hours)  Resp panel by RT-PCR (RSV, Flu A&B, Covid) Anterior Nasal Swab     Status: None   Collection Time: 08/17/24  1:15 PM   Specimen: Anterior Nasal Swab  Result Value Ref Range Status   SARS Coronavirus 2 by RT PCR NEGATIVE NEGATIVE Final   Influenza A by PCR NEGATIVE NEGATIVE Final   Influenza B by PCR NEGATIVE NEGATIVE Final    Comment: (NOTE) The Xpert Xpress SARS-CoV-2/FLU/RSV plus assay is intended as an aid in the diagnosis of influenza from Nasopharyngeal swab specimens and should not be used as a sole basis for treatment. Nasal washings and aspirates are unacceptable for Xpert Xpress SARS-CoV-2/FLU/RSV testing.  Fact Sheet for Patients: bloggercourse.com  Fact Sheet for Healthcare Providers: seriousbroker.it  This test is not yet approved or cleared by the [place]  FDA and has been authorized for detection and/or diagnosis of SARS-CoV-2 by FDA under an Emergency Use Authorization (EUA). This EUA will remain in effect (meaning this test can be used) for the duration of the COVID-19 declaration under Section 564(b)(1) of the Act, 21 U.S.C. section 360bbb-3(b)(1), unless the authorization is terminated or revoked.     Resp Syncytial Virus by PCR NEGATIVE NEGATIVE Final    Comment: (NOTE) Fact Sheet for Patients: bloggercourse.com  Fact Sheet for Healthcare Providers: seriousbroker.it  This test is not yet approved or cleared by the [place]  FDA and has been authorized for detection and/or diagnosis of SARS-CoV-2 by FDA under an Emergency Use Authorization  (EUA). This EUA will remain in effect (meaning this test can be used) for the duration of the COVID-19 declaration under Section 564(b)(1) of the Act, 21 U.S.C. section 360bbb-3(b)(1), unless the authorization is terminated or revoked.  Performed at [facility], [address] [place], [place] [address]          Radiology Studies: DG Chest Port 1 View Result Date: 08/17/2024 EXAM: 1 VIEW(S) XRAY OF THE CHEST 08/17/2024 01:41:00 PM COMPARISON: Comparison 04/08/2023. CLINICAL HISTORY: Shortness of breath. FINDINGS: LUNGS AND PLEURA: Persistent relatively low lung volumes. Linear interstitial opacities in the lung bases left greater than right slightly more conspicuous. Central bronchovascular crowding. No pleural effusion. No pneumothorax. HEART AND MEDIASTINUM: Cardiomegaly. Calcified aorta. BONES AND SOFT TISSUES: No acute osseous abnormality. IMPRESSION: 1. Linear interstitial opacities in the lung bases, left greater than right, slightly more conspicuous, with central bronchovascular crowding. 2. Cardiomegaly. Electronically signed by: Dayne Hassell MD 08/17/2024 02:13 PM EST RP Workstation: [id number]        Scheduled Meds:  atorvastatin   80 mg Oral Daily   azithromycin   500 mg Oral Daily   budesonide  (PULMICORT ) nebulizer solution  0.5 mg Nebulization BID   enoxaparin  (LOVENOX ) injection  30 mg Subcutaneous Q24H   escitalopram   20 mg Oral Daily   ezetimibe   10 mg Oral Daily   furosemide   40 mg Intravenous Daily   insulin  aspart  0-9 Units Subcutaneous TID WC   levalbuterol   0.63 mg Nebulization Q6H   [START ON 08/19/2024] methylPREDNISolone  (SOLU-MEDROL ) injection  80 mg Intravenous Daily   nebivolol   5 mg Oral Daily   pantoprazole   40 mg Oral Daily   sodium chloride  flush  3 mL Intravenous Q12H   Continuous Infusions:  sodium chloride             Sophie Mao, MD [facility] 08/18/2024, 10:43 AM   "

## 2024-08-18 NOTE — Progress Notes (Addendum)
 Patient is having minor visual hallucinations, thinking Darlyn is in the room with her, and she feels concerned about an ongoing green pill being an important heart medicine for her. Night nurse reported hallucinations during her shift as well.  Mental status likely from Acute metabolic encephalopathy , will continue to monitor, seemed very together when she got here, maybe will try reaching out to family or familiar voices.

## 2024-08-19 DIAGNOSIS — J9601 Acute respiratory failure with hypoxia: Secondary | ICD-10-CM

## 2024-08-19 LAB — TYPE AND SCREEN
ABO/RH(D): O POS
Antibody Screen: NEGATIVE
Unit division: 0

## 2024-08-19 LAB — CBC WITH DIFFERENTIAL/PLATELET
Abs Immature Granulocytes: 0.05 10*3/uL (ref 0.00–0.07)
Basophils Absolute: 0 10*3/uL (ref 0.0–0.1)
Basophils Relative: 0 %
Eosinophils Absolute: 0 10*3/uL (ref 0.0–0.5)
Eosinophils Relative: 0 %
HCT: 25 % — ABNORMAL LOW (ref 36.0–46.0)
Hemoglobin: 8.5 g/dL — ABNORMAL LOW (ref 12.0–15.0)
Immature Granulocytes: 1 %
Lymphocytes Relative: 13 %
Lymphs Abs: 1.3 10*3/uL (ref 0.7–4.0)
MCH: 32.2 pg (ref 26.0–34.0)
MCHC: 34 g/dL (ref 30.0–36.0)
MCV: 94.7 fL (ref 80.0–100.0)
Monocytes Absolute: 0.6 10*3/uL (ref 0.1–1.0)
Monocytes Relative: 6 %
Neutro Abs: 8 10*3/uL — ABNORMAL HIGH (ref 1.7–7.7)
Neutrophils Relative %: 80 %
Platelets: 153 10*3/uL (ref 150–400)
RBC: 2.64 MIL/uL — ABNORMAL LOW (ref 3.87–5.11)
RDW: 13.2 % (ref 11.5–15.5)
WBC: 10 10*3/uL (ref 4.0–10.5)
nRBC: 0 % (ref 0.0–0.2)

## 2024-08-19 LAB — BASIC METABOLIC PANEL WITH GFR
Anion gap: 10 (ref 5–15)
BUN: 39 mg/dL — ABNORMAL HIGH (ref 8–23)
CO2: 32 mmol/L (ref 22–32)
Calcium: 8.7 mg/dL — ABNORMAL LOW (ref 8.9–10.3)
Chloride: 97 mmol/L — ABNORMAL LOW (ref 98–111)
Creatinine, Ser: 2.2 mg/dL — ABNORMAL HIGH (ref 0.44–1.00)
GFR, Estimated: 23 mL/min — ABNORMAL LOW
Glucose, Bld: 102 mg/dL — ABNORMAL HIGH (ref 70–99)
Potassium: 5.1 mmol/L (ref 3.5–5.1)
Sodium: 138 mmol/L (ref 135–145)

## 2024-08-19 LAB — GLUCOSE, CAPILLARY
Glucose-Capillary: 111 mg/dL — ABNORMAL HIGH (ref 70–99)
Glucose-Capillary: 141 mg/dL — ABNORMAL HIGH (ref 70–99)
Glucose-Capillary: 131 mg/dL — ABNORMAL HIGH (ref 70–99)

## 2024-08-19 LAB — MAGNESIUM: Magnesium: 1.8 mg/dL (ref 1.7–2.4)

## 2024-08-19 LAB — BPAM RBC
Blood Product Expiration Date: 202602192359
ISSUE DATE / TIME: 202601251938
Unit Type and Rh: 5100

## 2024-08-19 MED ORDER — ENSURE PLUS HIGH PROTEIN PO LIQD
237.0000 mL | Freq: Two times a day (BID) | ORAL | Status: DC
  Administered 2024-08-19 – 2024-08-22 (×7): 237 mL via ORAL

## 2024-08-19 MED ORDER — LEVALBUTEROL HCL 0.63 MG/3ML IN NEBU: 0.6300 mg | INHALATION_SOLUTION | Freq: Two times a day (BID) | RESPIRATORY_TRACT | Status: DC

## 2024-08-19 MED ORDER — SODIUM CHLORIDE 0.9 % IV BOLUS
250.0000 mL | Freq: Once | INTRAVENOUS | Status: AC
  Administered 2024-08-19: 250 mL via INTRAVENOUS

## 2024-08-19 NOTE — TOC Initial Note (Addendum)
 Transition of Care [facility]) - Initial/Assessment Note    Patient Details  Name: Beverly Wheeler MRN: [id number] Date of Birth: 18-Jun-1946  [facility] CM/SW Contact:    Waddell Barnie Rama, RN Phone Number: 08/19/2024, 10:03 AM  Clinical Narrative:                 From home with son, has PCP and insurance on file, states has no HH services in place at this time, but had [facility] recently and would like them again,   has  home oxygen  2 liters with [employer], w/chair, walker at home.  States family member  (son) will transport them home at [employer] and family is support system, states [facility] [address].  Pta self ambulatory with walker.  Per PT eval rec HHPT, NCM offered choice, she would like [facility], Referral sent thru portal.  [facility] accpeted. Soc will begin 24 to 48 hrs post dc. She does not want HHRN.  Expected Discharge Plan: Home w Home Health Services Barriers to Discharge: Continued Medical Work up   Patient Goals and CMS Choice Patient states their goals for this hospitalization and ongoing recovery are:: return home with son CMS Medicare.gov Compare Post Acute Care list provided to:: Patient Choice offered to / list presented to : Patient      Expected Discharge Plan and Services   Discharge Planning Services: CM Consult Post Acute Care Choice: Home Health Living arrangements for the past 2 months: Single Family Home                 DME Arranged: N/A DME Agency: NA       HH Arranged: PT HH Agency: [facility] Date HH Agency Contacted: 08/19/24 Time HH Agency Contacted: 1002 Representative spoke with at [facility] Agency: [place]  Prior Living Arrangements/Services Living arrangements for the past 2 months: Single Family Home Lives with:: Adult Children (son) Patient language and need for interpreter reviewed:: Yes Do you feel safe going back to the place where you live?: Yes      Need for Family Participation in Patient  Care: Yes (Comment) Care giver support system in place?: Yes (comment) Current home services: DME (home oxygen  2 liters with [employer], w/chair, walker,) Criminal Activity/Legal Involvement Pertinent to Current Situation/Hospitalization: No - Comment as needed  Activities of Daily Living      Permission Sought/Granted Permission sought to share information with : Case Manager Permission granted to share information with : Yes, Verbal Permission Granted     Permission granted to share info w AGENCY: HH        Emotional Assessment   Attitude/Demeanor/Rapport: Engaged Affect (typically observed): Appropriate Orientation: : Oriented to Self, Oriented to Place, Oriented to  Time, Oriented to Situation Alcohol / Substance Use: Not Applicable Psych Involvement: No (comment)  Admission diagnosis:  Acute respiratory failure with hypoxia (HCC) [J96.01] Patient Active Problem List   Diagnosis Date Noted   Acute on chronic respiratory failure with hypoxia (HCC) 08/17/2024   Acute kidney injury superimposed on chronic kidney disease 08/17/2024   Closed fracture of distal end of right fibula and tibia 03/21/2024   Displaced pilon fracture of right tibia, initial encounter for closed fracture 03/21/2024   Closed right ankle fracture 03/20/2024   Acute encephalopathy 03/20/2024   Goals of care, counseling/discussion 03/20/2024   Leukocytosis 03/20/2024   HFrEF (heart failure with reduced ejection fraction) (HCC) 05/12/2023   Ischemic cardiomyopathy 04/12/2023   COPD exacerbation (HCC) 04/08/2023  Pyuria 01/14/2022   Chronic respiratory failure with hypoxia and hypercapnia (HCC) 01/14/2022   Hyperammonemia 01/14/2022   Diabetes mellitus (HCC) 05/19/2020   Venous stasis dermatitis of both lower extremities 01/08/2018   Iron  deficiency anemia 01/08/2018   Acute respiratory failure with hypoxia (HCC)    Hypokalemia    Pressure injury of skin 11/24/2017   Acute kidney injury    Cellulitis of  left lower extremity    Sepsis (HCC)    Septic shock (HCC)    Hypovolemic shock (HCC)    Hypotension 11/23/2017   CKD (chronic kidney disease), stage III 11/22/2017   CAD, multiple vessel 10/30/2013   Pre-op evaluation 10/17/2013   Myocardial infarction (HCC) 10/16/2013   Acute MI (HCC) 10/16/2013   OVERWEIGHT 12/10/2009   CIGARETTE SMOKER, has stopped smoking 12/10/2009   Essential hypertension 12/10/2009   DEGENERATIVE JOINT DISEASE 12/10/2009   HOARSENESS, CHRONIC 12/10/2009   CHEST PAIN UNSPECIFIED 12/10/2009   Hyperlipidemia, mixed 01/24/2008   Anxiety disorder, unspecified 01/24/2008   Allergic rhinitis 01/24/2008   COPD  GOLD II based on fev1/VC of 58%  01/24/2008   GERD 01/24/2008   POSITIVE PPD 01/24/2008   PCP:  Jordan, Betty G, MD Pharmacy:   [phone] [place] [place] [facility] [address] [facility] [address] [place] [address] Phone: [phone] Fax: [phone]  [facility] [address] [place] [address] Phone: [phone] Fax: [phone]  [facility] - [facility] [address] [place] [address] Phone: [phone] Fax: [phone]     Social Drivers of Health (SDOH) Social History: SDOH Screenings   Food Insecurity: No Food Insecurity (08/18/2024)  Housing: Low Risk (08/18/2024)  Transportation Needs: No Transportation Needs (08/18/2024)  Utilities: Not At Risk (08/18/2024)  Alcohol Screen: Low Risk (04/12/2023)  Depression (PHQ2-9): Low Risk (09/26/2023)  Financial Resource Strain: Low Risk (04/12/2023)  Physical Activity: Inactive (05/02/2022)  Social Connections: Socially Isolated (08/18/2024)  Stress: No Stress Concern Present (05/02/2022)  Tobacco Use: Medium Risk (08/17/2024)   SDOH Interventions: Social Connections Interventions: Community Resources Provided, Inpatient TOC   Readmission Risk Interventions    08/19/2024    9:57 AM   Readmission Risk Prevention Plan  Transportation Screening Complete  PCP or Specialist Appt within 3-5 Days Complete  HRI or Home Care Consult Complete  Palliative Care Screening Complete  Medication Review (RN Care Manager) Complete

## 2024-08-19 NOTE — Progress Notes (Signed)
 "  Palliative Medicine Inpatient Follow Up Note HPI:  Beverly Dede. Wheeler is a 79 year old female with a past medical history significant for hypoxic respiratory failure on 2 L nasal cannula, COPD, coronary artery disease, congestive heart failure, chronic kidney disease stage IIIa, type 2 diabetes mellitus, hypertension, hyperlipidemia, iron  deficiency anemia.  Beverly Wheeler was admitted to the hospital with shortness of breath.  She is being treated for heart failure exacerbation as well as COPD exacerbation.  Palliative care has been asked to support additional goals of care conversations in the setting of chronic disease burden.   Today's Discussion 08/19/2024  I reviewed the chart notes including nursing notes from [facility], progress notes from Dr. Cheryle, Sari Ginsberg, Bernarda Maris, Barnie Birmingham, Silvano Molt, and Alan Forbes. I also reviewed vital signs which has been stable, nursing flowsheets 25-50% oral intake at meals, medication administrations record, labs BMP and CBC from today, and imaging - no imaging within the last 24 hours  I met with Beverly Wheeler at bedside this morning. She is awake and alert. She shares that she is generally feeling improved as compared to when she was admitted to the hospital.  Created space and opportunity for patient to explore thoughts feelings and fears regarding current medical situation. She shares understanding of her restrictions moving forward associated with her diet and lifestyle.   Beverly Wheeler and I completed a MOST form today. The patient outlined her wishes for the following treatment decisions:  Cardiopulmonary Resuscitation: Do Not Attempt Resuscitation (DNR/No CPR)  Medical Interventions: Limited Additional Interventions: Use medical treatment, IV fluids and cardiac monitoring as indicated, DO NOT USE intubation or mechanical ventilation. May consider use of less invasive airway support such as BiPAP or CPAP. Also provide comfort measures. Transfer to the  hospital if indicated. Avoid intensive care.   Antibiotics: Antibiotics if indicated  IV Fluids: IV fluids if indicated  Feeding Tube: No feeding tube   Questions and concerns addressed/Palliative Support Provided.   Objective Assessment: Vital Signs Vitals:   08/19/24 0801 08/19/24 1005  BP:  (!) 117/46  Pulse:    Resp:    Temp:    SpO2: 100%     Intake/Output Summary (Last 24 hours) at 08/19/2024 1310 Last data filed at 08/19/2024 0455 Gross per 24 hour  Intake 467 ml  Output 300 ml  Net 167 ml   Last Weight  Most recent update: 08/19/2024  4:51 AM    Weight  78.2 kg (172 lb 6.4 oz)            Gen:  Elderly Caucasian F chronically ill in appearance HEENT: moist mucous membranes CV: Regular rate and rhythm  PULM:  On 2LPM [place], breathing is nonlabored ABD: soft/nontender  EXT: RLE edema more pronounced than LLE Neuro: Alert and oriented x3  SUMMARY OF RECOMMENDATIONS   DNAR/DNI  MOST / DNR Form Completed, paper copy placed onto the chart electric copy can be found in [facility]  Patients shared understanding of restrictions associated w/ heart failure  The PMT will remain available as needed ______________________________________________________________________________________ [name] [name] Palliative Medicine Team Team Cell Phone: [phone] Please utilize secure chat with additional questions, if there is no response within 30 minutes please call the above phone number  I personally spent a total of 50 minutes in the care of the patient today including preparing to see the patient, getting/reviewing separately obtained history, performing a medically appropriate exam/evaluation, counseling and educating, referring and communicating with other health care professionals, documenting clinical information  in the EHR, and coordinating care.     "

## 2024-08-19 NOTE — Plan of Care (Signed)
" °  Problem: Education: Goal: Ability to describe self-care measures that may prevent or decrease complications (Diabetes Survival Skills Education) will improve Outcome: Progressing   Problem: Fluid Volume: Goal: Ability to maintain a balanced intake and output will improve Outcome: Progressing   Problem: Metabolic: Goal: Ability to maintain appropriate glucose levels will improve Outcome: Progressing   Problem: Skin Integrity: Goal: Risk for impaired skin integrity will decrease Outcome: Progressing   Problem: Clinical Measurements: Goal: Will remain free from infection Outcome: Progressing Goal: Respiratory complications will improve Outcome: Progressing   Problem: Activity: Goal: Risk for activity intolerance will decrease Outcome: Progressing   Problem: Pain Managment: Goal: General experience of comfort will improve and/or be controlled Outcome: Progressing   Problem: Education: Goal: Ability to demonstrate management of disease process will improve Outcome: Progressing   "

## 2024-08-19 NOTE — Progress Notes (Signed)
 " PROGRESS NOTE    Beverly Wheeler  FMW:[id number] DOB: Sep 28, 1945 DOA: 08/17/2024 PCP: Jordan, Betty G, MD   Brief Narrative:   79 y.o. female with medical history significant of chronic hypoxic respiratory failure 2 L nasal cannula, COPD, CAD, CHF with reduced EF, CKD 3A, HTN, HLD, DM 2, iron  deficiency anemia and hospitalization from 03/20/2024 to 03/26/2024 for right distal tib-fib fracture requiring ORIF presented with worsening shortness of breath.  On presentation, she was satting in the mid 90s on 3 L nasal cannula.  Creatinine of 1.71, proBNP of 5290.  Chest x-ray showed central bronchovascular crowding and cardiomegaly.  She was started on IV Lasix  and steroids.  Assessment & Plan:   Acute on chronic respiratory failure with hypoxia COPD exacerbation Acute on chronic systolic CHF -Wears 2 L oxygen  via nasal cannula at home.  Required 3 L on presentation.  Respiratory status improving: Currently on 2 L oxygen . - Strict input and output.  Daily weights.  Continue Nebivolol .  Creatinine trending upwards: DC IV Lasix  today. - 2D echo showed EF of 30 to 35% with grade 2 diastolic dysfunction.   - Continue steroids.  Continue current nebs and oral Zithromax .  Acute metabolic encephalopathy - Patient was lethargic on presentation.  Possibly from above.  Mental status much improved.  Monitor mental status.  Multivessel CAD Positive troponins Hypertension Hyperlipidemia - No chest pain.  CABG was recommended in 2015 by cardiology but patient declined CABG.  Currently no chest pain.  Outpatient follow-up with cardiology.  Continue statin, ezetimibe , Nebivolol   - High sensitive troponins did not trend up.  Diabetes mellitus type 2 with hyperglycemia - A1c 5.4.  Continue CBGs with SSI  AKI on CKD stage IIIb - Patient creatinine of 1.2-1.4.  Creatinine 1.71 on presentation.  Creatinine worsening to 2.20 today.  DC Lasix .  Monitor.  Hyperkalemia - Resolved.   Monitor.  Anxiety/depression - Continue escitalopram   Iron  deficiency anemia - Baseline hemoglobin of 8-9.  Hemoglobin 7.1 on 08/18/2024.  Received 1 unit packed red cells.  Hemoglobin 8.5 this morning.    Physical deconditioning - PT eval  Goals of care - Palliative care following  DVT prophylaxis:  Lovenox  Code Status: DNR Family Communication: None at bedside Disposition Plan: Status is: inpatient because: Of severity of illness    Consultants: Palliative care  Procedures: 2D echo   Antimicrobials: Zithromax  from 08/17/2024 onwards   Subjective: Patient seen and examined at bedside.  Still short of breath with slight exertion but continues to feel slightly better.  No chest pain reported.  Denies any fever or vomiting.  Still feels weak. Objective: Vitals:   08/18/24 2315 08/19/24 0204 08/19/24 0323 08/19/24 0450  BP: (!) 114/46  (!) 116/49   Pulse: 68  75   Resp: 17  16   Temp: 99.3 F (37.4 C)  98.1 F (36.7 C)   TempSrc: Oral  Oral   SpO2: 98% 100% 98%   Weight:    78.2 kg  Height:        Intake/Output Summary (Last 24 hours) at 08/19/2024 0727 Last data filed at 08/19/2024 0455 Gross per 24 hour  Intake 587 ml  Output 300 ml  Net 287 ml   Filed Weights   08/17/24 1259 08/18/24 0326 08/19/24 0450  Weight: 81.4 kg 80 kg 78.2 kg    Examination:  General: On 2 L oxygen  via nasal cannula.  No distress ENT/neck: No thyromegaly.  JVD is not elevated  respiratory: Decreased breath sounds  at bases bilaterally with some crackles; no wheezing  CVS: S1-S2 heard, rate controlled Abdominal: Soft, obese, nontender, slightly distended; no organomegaly, normal bowel sounds are heard Extremities: Trace lower extremity edema; no cyanosis  CNS: Awake and alert.  No focal neurologic deficit.  Moves extremities Lymph: No obvious lymphadenopathy Skin: No obvious ecchymosis/lesions  psych: Mostly flat affect.  Not agitated currently.   Musculoskeletal: No obvious  joint swelling/deformity    Data Reviewed: I have personally reviewed following labs and imaging studies  CBC: Recent Labs  Lab 08/16/24 1614 08/17/24 1315 08/17/24 1323 08/18/24 0301 08/18/24 0948 08/19/24 0119  WBC 8.8 8.9  --  7.0 10.1 10.0  NEUTROABS  --  5.8  --   --  8.7* 8.0*  HGB 8.3* 8.4* 8.8*  8.8* 7.2* 7.1* 8.5*  HCT 22.6* 26.1* 26.0*  26.0* 22.1* 21.1* 25.0*  MCV 102* 99.2  --  95.3 94.6 94.7  PLT 164 155  --  148* 148* 153   Basic Metabolic Panel: Recent Labs  Lab 08/16/24 1614 08/17/24 1315 08/17/24 1323 08/18/24 0301 08/18/24 0948 08/19/24 0119  NA 141 140 140  141 140 138 138  K 5.7* 4.5 4.5  4.5 5.8* 5.8* 5.1  CL 101 102 100 101 100 97*  CO2 25 29  --  34* 31 32  GLUCOSE 123* 150* 150* 146* 123* 102*  BUN 24 22 27* 26* 29* 39*  CREATININE 1.83* 1.71* 1.80* 1.80* 1.91* 2.20*  CALCIUM  9.1 9.5  --  9.2 9.3 8.7*  MG  --   --   --   --  1.8 1.8   GFR: Estimated Creatinine Clearance: 20.6 mL/min (A) (by C-G formula based on SCr of 2.2 mg/dL (H)). Liver Function Tests: Recent Labs  Lab 08/16/24 1614 08/17/24 1315 08/18/24 0301 08/18/24 0948  AST 8 16 27  33  ALT 5 7 8 8   ALKPHOS 124 120 101 96  BILITOT <0.2 0.3 0.2 0.3  PROT 6.3 6.7 6.1* 6.1*  ALBUMIN  3.9 3.7 3.5 3.5   No results for input(s): LIPASE, AMYLASE in the last 168 hours. Recent Labs  Lab 08/17/24 1758  AMMONIA 21   Coagulation Profile: No results for input(s): INR, PROTIME in the last 168 hours. Cardiac Enzymes: No results for input(s): CKTOTAL, CKMB, CKMBINDEX, TROPONINI in the last 168 hours. BNP (last 3 results) Recent Labs    08/17/24 1315  PROBNP 5,290.0*   HbA1C: Recent Labs    08/16/24 1607  HGBA1C 5.4   CBG: Recent Labs  Lab 08/17/24 2125 08/18/24 0557 08/18/24 1144 08/18/24 1630 08/18/24 2141  GLUCAP 158* 139* 108* 135* 122*   Lipid Profile: No results for input(s): CHOL, HDL, LDLCALC, TRIG, CHOLHDL, LDLDIRECT in the  last 72 hours. Thyroid  Function Tests: No results for input(s): TSH, T4TOTAL, FREET4, T3FREE, THYROIDAB in the last 72 hours. Anemia Panel: No results for input(s): VITAMINB12, FOLATE, FERRITIN, TIBC, IRON , RETICCTPCT in the last 72 hours. Sepsis Labs: No results for input(s): PROCALCITON, LATICACIDVEN in the last 168 hours.  Recent Results (from the past 240 hours)  Resp panel by RT-PCR (RSV, Flu A&B, Covid) Anterior Nasal Swab     Status: None   Collection Time: 08/17/24  1:15 PM   Specimen: Anterior Nasal Swab  Result Value Ref Range Status   SARS Coronavirus 2 by RT PCR NEGATIVE NEGATIVE Final   Influenza A by PCR NEGATIVE NEGATIVE Final   Influenza B by PCR NEGATIVE NEGATIVE Final    Comment: (NOTE) The Xpert Xpress  SARS-CoV-2/FLU/RSV plus assay is intended as an aid in the diagnosis of influenza from Nasopharyngeal swab specimens and should not be used as a sole basis for treatment. Nasal washings and aspirates are unacceptable for Xpert Xpress SARS-CoV-2/FLU/RSV testing.  Fact Sheet for Patients: bloggercourse.com  Fact Sheet for Healthcare Providers: seriousbroker.it  This test is not yet approved or cleared by the [place]  FDA and has been authorized for detection and/or diagnosis of SARS-CoV-2 by FDA under an Emergency Use Authorization (EUA). This EUA will remain in effect (meaning this test can be used) for the duration of the COVID-19 declaration under Section 564(b)(1) of the Act, 21 U.S.C. section 360bbb-3(b)(1), unless the authorization is terminated or revoked.     Resp Syncytial Virus by PCR NEGATIVE NEGATIVE Final    Comment: (NOTE) Fact Sheet for Patients: bloggercourse.com  Fact Sheet for Healthcare Providers: seriousbroker.it  This test is not yet approved or cleared by the [place]  FDA and has been authorized for  detection and/or diagnosis of SARS-CoV-2 by FDA under an Emergency Use Authorization (EUA). This EUA will remain in effect (meaning this test can be used) for the duration of the COVID-19 declaration under Section 564(b)(1) of the Act, 21 U.S.C. section 360bbb-3(b)(1), unless the authorization is terminated or revoked.  Performed at [facility], [address], [place], [place] [address]          Radiology Studies: VAS US  LOWER EXTREMITY VENOUS (DVT) Result Date: 08/18/2024  Lower Venous DVT Study Patient Name:  KINLEY DOZIER A [facility]  Date of Exam:   08/18/2024 Medical Rec #: [id number]            Accession #:    [id number] Date of Birth: 1945-09-15            Patient Gender: F Patient Age:   16 years Exam Location:  [facility] Procedure:      VAS US  LOWER EXTREMITY VENOUS (DVT) Referring Phys: SOPHIE MAO --------------------------------------------------------------------------------  Indications: Edema.  Limitations: Patient immobility - poor positioning of LLE. Comparison Study: Previous exam on 11/29/2017 was negative for DVT. Performing Technologist: Ezzie Potters RVT, RDMS  Examination Guidelines: A complete evaluation includes B-mode imaging, spectral Doppler, color Doppler, and power Doppler as needed of all accessible portions of each vessel. Bilateral testing is considered an integral part of a complete examination. Limited examinations for reoccurring indications may be performed as noted. The reflux portion of the exam is performed with the patient in reverse Trendelenburg.  +---------+---------------+---------+-----------+----------+--------------+ RIGHT    CompressibilityPhasicitySpontaneityPropertiesThrombus Aging +---------+---------------+---------+-----------+----------+--------------+ CFV      Full           Yes      Yes                                 +---------+---------------+---------+-----------+----------+--------------+ SFJ      Full                                                         +---------+---------------+---------+-----------+----------+--------------+ FV Prox  Full           Yes      Yes                                 +---------+---------------+---------+-----------+----------+--------------+  FV Mid   Full           Yes      Yes                                 +---------+---------------+---------+-----------+----------+--------------+ FV DistalFull           Yes      Yes                                 +---------+---------------+---------+-----------+----------+--------------+ PFV      Full                                                        +---------+---------------+---------+-----------+----------+--------------+ POP      Full           Yes      Yes                                 +---------+---------------+---------+-----------+----------+--------------+ PTV      Full                                                        +---------+---------------+---------+-----------+----------+--------------+ PERO     Full                                                        +---------+---------------+---------+-----------+----------+--------------+   +---------+---------------+---------+-----------+----------+-------------------+ LEFT     CompressibilityPhasicitySpontaneityPropertiesThrombus Aging      +---------+---------------+---------+-----------+----------+-------------------+ CFV      Full           Yes      Yes                                      +---------+---------------+---------+-----------+----------+-------------------+ SFJ      Full                                                             +---------+---------------+---------+-----------+----------+-------------------+ FV Prox  Full           Yes      Yes                                      +---------+---------------+---------+-----------+----------+-------------------+ FV Mid   Full           Yes       Yes                                      +---------+---------------+---------+-----------+----------+-------------------+  FV DistalFull           Yes      Yes                                      +---------+---------------+---------+-----------+----------+-------------------+ PFV      Full                                                             +---------+---------------+---------+-----------+----------+-------------------+ POP      Full           Yes      Yes                                      +---------+---------------+---------+-----------+----------+-------------------+ PTV      Full                                         Not well visualized +---------+---------------+---------+-----------+----------+-------------------+ PERO     Full                                         Not well visualized +---------+---------------+---------+-----------+----------+-------------------+     Summary: BILATERAL: - No evidence of deep vein thrombosis seen in the lower extremities, bilaterally. -No evidence of popliteal cyst, bilaterally.   *See table(s) above for measurements and observations. Electronically signed by Norman Serve on 08/18/2024 at 6:51:02 PM.    Final    ECHOCARDIOGRAM COMPLETE Result Date: 08/18/2024    ECHOCARDIOGRAM REPORT   Patient Name:   [facility] Date of Exam: 08/18/2024 Medical Rec #:  [id number]           Height:       61.0 in Accession #:    [id number]          Weight:       176.4 lb Date of Birth:  Jul 18, 1946           BSA:          1.790 m Patient Age:    78 years            BP:           115/76 mmHg Patient Gender: F                   HR:           79 bpm. Exam Location:  Inpatient Procedure: 2D Echo (Both Spectral and Color Flow Doppler were utilized during            procedure). Indications:    congestive heart failure  History:        Patient has prior history of Echocardiogram examinations, most                 recent 04/09/2023.  Cardiomyopathy, CAD, COPD and chronic kidney                 disease; Risk Factors:Diabetes, Hypertension, Dyslipidemia  and                 Former Smoker.  Sonographer:    Tinnie Barefoot RDCS Referring Phys: [phone] STEVEN J NEWTON IMPRESSIONS  1. Left ventricular ejection fraction, by estimation, is 30 to 35%. Left ventricular ejection fraction by 2D MOD biplane is 35.8 %. The left ventricle has moderately decreased function. The left ventricle has no regional wall motion abnormalities. The left ventricular internal cavity size was severely dilated. There is severe eccentric left ventricular hypertrophy. Left ventricular diastolic parameters are consistent with Grade II diastolic dysfunction (pseudonormalization). Elevated left atrial pressure.  2. Right ventricular systolic function is normal. The right ventricular size is normal. There is moderately elevated pulmonary artery systolic pressure. The estimated right ventricular systolic pressure is 47.7 mmHg.  3. Left atrial size was severely dilated.  4. The mitral valve is normal in structure. Mild mitral valve regurgitation. No evidence of mitral stenosis.  5. The aortic valve is calcified. Aortic valve regurgitation is not visualized. Aortic valve sclerosis/calcification is present, without any evidence of aortic stenosis.  6. The inferior vena cava is dilated in size with <50% respiratory variability, suggesting right atrial pressure of 15 mmHg. Comparison(s): Prior images reviewed side by side. Changes from prior study are noted. EF has improved slightly, but wall motion abnormalities persist. FINDINGS  Left Ventricle: Left ventricular ejection fraction, by estimation, is 30 to 35%. Left ventricular ejection fraction by 2D MOD biplane is 35.8 %. The left ventricle has moderately decreased function. The left ventricle has no regional wall motion abnormalities. Definity  contrast agent was given IV to delineate the left ventricular endocardial borders. The left  ventricular internal cavity size was severely dilated. There is severe eccentric left ventricular hypertrophy. Left ventricular diastolic parameters are consistent with Grade II diastolic dysfunction (pseudonormalization). Elevated left atrial pressure.  LV Wall Scoring: The mid and distal anterior septum, mid and distal inferior wall, mid inferoseptal segment, apical anterior segment, and apex are akinetic. The anterior wall, mid and distal lateral wall, posterior wall, basal anteroseptal segment, mid anterolateral segment, and basal inferior segment are hypokinetic. The basal anterolateral segment and basal inferoseptal segment are normal. Right Ventricle: The right ventricular size is normal. No increase in right ventricular wall thickness. Right ventricular systolic function is normal. There is moderately elevated pulmonary artery systolic pressure. The tricuspid regurgitant velocity is 2.86 m/s, and with an assumed right atrial pressure of 15 mmHg, the estimated right ventricular systolic pressure is 47.7 mmHg. Left Atrium: Left atrial size was severely dilated. Right Atrium: Right atrial size was normal in size. Pericardium: There is no evidence of pericardial effusion. Mitral Valve: The mitral valve is normal in structure. Mild mitral valve regurgitation. No evidence of mitral valve stenosis. Tricuspid Valve: The tricuspid valve is normal in structure. Tricuspid valve regurgitation is trivial. No evidence of tricuspid stenosis. Aortic Valve: The aortic valve is calcified. Aortic valve regurgitation is not visualized. Aortic valve sclerosis/calcification is present, without any evidence of aortic stenosis. Pulmonic Valve: The pulmonic valve was not well visualized. Pulmonic valve regurgitation is not visualized. No evidence of pulmonic stenosis. Aorta: The aortic root is normal in size and structure. Venous: The inferior vena cava is dilated in size with less than 50% respiratory variability, suggesting right  atrial pressure of 15 mmHg. IAS/Shunts: No atrial level shunt detected by color flow Doppler.  LEFT VENTRICLE PLAX 2D  Biplane EF (MOD) LVIDd:         5.40 cm         LV Biplane EF:   Left LVIDs:         4.80 cm                          ventricular LV PW:         1.00 cm                          ejection LV IVS:        1.10 cm                          fraction by LVOT diam:     1.70 cm                          2D MOD LV SV:         42                               biplane is LV SV Index:   23                               35.8 %. LVOT Area:     2.27 cm LV IVRT:       63 msec         Diastology                                LV e' medial:    5.33 cm/s                                LV E/e' medial:  23.8 LV Volumes (MOD)               LV e' lateral:   5.77 cm/s LV vol d, MOD    315.0 ml      LV E/e' lateral: 22.0 A2C: LV vol d, MOD    179.0 ml A4C: LV vol s, MOD    205.0 ml A2C: LV vol s, MOD    121.0 ml A4C: LV SV MOD A2C:   110.0 ml LV SV MOD A4C:   179.0 ml LV SV MOD BP:    91.1 ml RIGHT VENTRICLE             IVC RV Basal diam:  2.30 cm     IVC diam: 2.20 cm RV S prime:     12.70 cm/s TAPSE (M-mode): 1.6 cm      PULMONARY VEINS                             Diastolic Velocity: 49.50 cm/s                             S/D Velocity:       1.20  Systolic Velocity:  59.50 cm/s LEFT ATRIUM              Index        RIGHT ATRIUM           Index LA diam:        4.50 cm  2.51 cm/m   RA Area:     15.40 cm LA Vol (A2C):   109.0 ml 60.88 ml/m  RA Volume:   35.70 ml  19.94 ml/m LA Vol (A4C):   88.9 ml  49.65 ml/m LA Biplane Vol: 99.5 ml  55.57 ml/m  AORTIC VALVE LVOT Vmax:   87.40 cm/s LVOT Vmean:  59.800 cm/s LVOT VTI:    0.185 m  AORTA Ao Root diam: 3.00 cm Ao Asc diam:  2.70 cm MITRAL VALVE                  TRICUSPID VALVE MV Area (PHT): 4.39 cm       TR Peak grad:   32.7 mmHg MV Decel Time: 173 msec       TR Vmax:        286.00 cm/s MR Peak grad:    71.6 mmHg MR Mean  grad:    48.0 mmHg    SHUNTS MR Vmax:         423.00 cm/s  Systemic VTI:  0.18 m MR Vmean:        328.0 cm/s   Systemic Diam: 1.70 cm MR PISA:         0.57 cm MR PISA Eff ROA: 5 mm MR PISA Radius:  0.30 cm MV E velocity: 127.00 cm/s MV A velocity: 101.00 cm/s MV E/A ratio:  1.26 Franck Azobou Tonleu Electronically signed by Joelle Ren Ny Signature Date/Time: 08/18/2024/11:04:55 AM    Final    DG Chest Port 1 View Result Date: 08/17/2024 EXAM: 1 VIEW(S) XRAY OF THE CHEST 08/17/2024 01:41:00 PM COMPARISON: Comparison 04/08/2023. CLINICAL HISTORY: Shortness of breath. FINDINGS: LUNGS AND PLEURA: Persistent relatively low lung volumes. Linear interstitial opacities in the lung bases left greater than right slightly more conspicuous. Central bronchovascular crowding. No pleural effusion. No pneumothorax. HEART AND MEDIASTINUM: Cardiomegaly. Calcified aorta. BONES AND SOFT TISSUES: No acute osseous abnormality. IMPRESSION: 1. Linear interstitial opacities in the lung bases, left greater than right, slightly more conspicuous, with central bronchovascular crowding. 2. Cardiomegaly. Electronically signed by: Dayne Hassell MD 08/17/2024 02:13 PM EST RP Workstation: [id number]        Scheduled Meds:  sodium chloride    Intravenous Once   atorvastatin   80 mg Oral Daily   azithromycin   500 mg Oral Daily   budesonide  (PULMICORT ) nebulizer solution  0.5 mg Nebulization BID   enoxaparin  (LOVENOX ) injection  30 mg Subcutaneous Q24H   escitalopram   20 mg Oral Daily   ezetimibe   10 mg Oral Daily   furosemide   40 mg Intravenous Daily   insulin  aspart  0-9 Units Subcutaneous TID WC   levalbuterol   0.63 mg Nebulization Q6H   methylPREDNISolone  (SOLU-MEDROL ) injection  80 mg Intravenous Daily   nebivolol   5 mg Oral Daily   pantoprazole   40 mg Oral Daily   sodium chloride  flush  3 mL Intravenous Q12H   Continuous Infusions:          Sophie Mao, MD [facility] 08/19/2024, 7:27 AM   "

## 2024-08-19 NOTE — Evaluation (Signed)
 Physical Therapy Evaluation Patient Details Name: Beverly Wheeler MRN: [id number] DOB: 1945/09/27 Today's Date: 08/19/2024  History of Present Illness  Pt is a 79 y.o. female admitted 1/24 for acute on chronic respiratory failure. PMH:  COPD (2L at baseline), CAD, CHF with reduced EF, CKD 3A, HTN, HLD, DM 2, iron  deficiency anemia and hospitalization from 03/20/2024 to 03/26/2024 for R distal tib-fib fx requiring ORIF   Clinical Impression  Pt admitted with above diagnosis. PTA pt lived at home with her son, [name]. Primarily mobilizes at w/c level. Does amb short distance, bed to recliner, with rollator and assist. Pt currently with functional limitations due to the deficits listed below (see PT Problem List). On eval, pt required min assist bed mobility, min assist sit to stand with RW, and mod assist sidestepping with RW. Fair sitting balance and poor standing balance. Generalized weakness. Desat to 90% during mobility on 2L. Pt will benefit from acute skilled PT to increase their independence and safety with mobility to allow discharge. Post acute, pt would benefit from HHPT.          If plan is discharge home, recommend the following: A lot of help with bathing/dressing/bathroom;A lot of help with walking and/or transfers;Supervision due to cognitive status;Help with stairs or ramp for entrance;Assistance with cooking/housework;Assist for transportation   Can travel by private vehicle        Equipment Recommendations None recommended by PT  Recommendations for Other Services       Functional Status Assessment Patient has had a recent decline in their functional status and demonstrates the ability to make significant improvements in function in a reasonable and predictable amount of time.     Precautions / Restrictions Precautions Precautions: Fall Recall of Precautions/Restrictions: Impaired      Mobility  Bed Mobility Overal bed mobility: Needs Assistance Bed Mobility: Supine  to Sit     Supine to sit: Min assist, HOB elevated, Used rails     General bed mobility comments: increased time, assist with trunk    Transfers Overall transfer level: Needs assistance Equipment used: Rolling walker (2 wheels) Transfers: Sit to/from Stand Sit to Stand: Min assist           General transfer comment: increased time to power up. Retropulsive. Bracing BLE on bed.    Ambulation/Gait               General Gait Details: sidestepping bedside x 2-3 steps with RW mod assist  Stairs            Wheelchair Mobility     Tilt Bed    Modified Rankin (Stroke Patients Only)       Balance Overall balance assessment: Needs assistance Sitting-balance support: Feet supported, No upper extremity supported Sitting balance-Leahy Scale: Fair     Standing balance support: Bilateral upper extremity supported, During functional activity, Reliant on assistive device for balance Standing balance-Leahy Scale: Poor                               Pertinent Vitals/Pain Pain Assessment Pain Assessment: No/denies pain    Home Living Family/patient expects to be discharged to:: Private residence Living Arrangements: Children (son, Beverly Wheeler) Available Help at Discharge: Family;Available 24 hours/day Type of Home: House Home Access: Stairs to enter;Ramped entrance   Entrance Stairs-Number of Steps: 6 (removeable ramp)   Home Layout: Able to live on main level with bedroom/bathroom Home Equipment: Rollator (4 wheels);Wheelchair -  manual;Shower seat;Rolling Walker (2 wheels)      Prior Function Prior Level of Function : Needs assist;Patient poor historian/Family not available             Mobility Comments: Per chart, pt pivots transfers to/from w/c. Per pt, amb with rollator in house. Wheelchair for in/out of house. ADLs Comments: Sponge bathes at baseline. Son assists with all ADLs.     Extremity/Trunk Assessment   Upper Extremity  Assessment Upper Extremity Assessment: Generalized weakness    Lower Extremity Assessment Lower Extremity Assessment: Generalized weakness    Cervical / Trunk Assessment Cervical / Trunk Assessment: Kyphotic  Communication   Communication Communication: Impaired Factors Affecting Communication: Reduced clarity of speech    Cognition Arousal: Alert Behavior During Therapy: WFL for tasks assessed/performed   PT - Cognitive impairments: No family/caregiver present to determine baseline, Orientation, Awareness, Memory, Sequencing, Initiation, Problem solving, Safety/Judgement   Orientation impairments: Place, Time, Situation                     Following commands: Impaired Following commands impaired: Only follows one step commands consistently, Follows one step commands with increased time     Cueing Cueing Techniques: Verbal cues     General Comments General comments (skin integrity, edema, etc.): SpO2 97% at rest on 2L. Desat to 90% during activity    Exercises     Assessment/Plan    PT Assessment Patient needs continued PT services  PT Problem List Decreased strength;Decreased mobility;Decreased safety awareness;Decreased activity tolerance;Decreased cognition;Decreased balance       PT Treatment Interventions DME instruction;Therapeutic exercise;Gait training;Balance training;Functional mobility training;Therapeutic activities;Patient/family education;Cognitive remediation    PT Goals (Current goals can be found in the Care Plan section)  Acute Rehab PT Goals Patient Stated Goal: home PT Goal Formulation: With patient Time For Goal Achievement: 09/02/24 Potential to Achieve Goals: Fair    Frequency Min 2X/week     Co-evaluation               AM-PAC PT 6 Clicks Mobility  Outcome Measure Help needed turning from your back to your side while in a flat bed without using bedrails?: A Little Help needed moving from lying on your back to sitting  on the side of a flat bed without using bedrails?: A Little Help needed moving to and from a bed to a chair (including a wheelchair)?: A Lot Help needed standing up from a chair using your arms (e.g., wheelchair or bedside chair)?: A Little Help needed to walk in hospital room?: A Lot Help needed climbing 3-5 steps with a railing? : Total 6 Click Score: 14    End of Session Equipment Utilized During Treatment: Gait belt;Oxygen  Activity Tolerance: Patient tolerated treatment well Patient left: in bed;with call bell/phone within reach Nurse Communication: Mobility status PT Visit Diagnosis: Muscle weakness (generalized) (M62.81);Difficulty in walking, not elsewhere classified (R26.2)    Time: 9174-9155 PT Time Calculation (min) (ACUTE ONLY): 19 min   Charges:   PT Evaluation $PT Eval Moderate Complexity: 1 Mod   PT General Charges $$ ACUTE PT VISIT: 1 Visit         Sari MATSU., PT  Office # [phone]   Erven Sari Shaker 08/19/2024, 9:00 AM

## 2024-08-19 NOTE — Progress Notes (Signed)
 Initial Nutrition Assessment  DOCUMENTATION CODES:   Not applicable  INTERVENTION:  Liberalize diet to 2 gram sodium to provide wider variety of menu options for optimal oral intake Fluid restriction per MD; currently on daily Ensure Plus High Protein po BID, each supplement provides 350 kcal and 20 grams of protein. Low Sodium Nutrition Therapy handout added to AVS  NUTRITION DIAGNOSIS:   Increased nutrient needs related to acute illness (acute COPD exacerbation) as evidenced by estimated needs.  GOAL:   Patient will meet greater than or equal to 90% of their needs  MONITOR:   Supplement acceptance, Diet advancement, PO intake, Labs  REASON FOR ASSESSMENT:   Consult Diet education (low sodium, fluid restricted diet education)  ASSESSMENT:   Pt presented with worsening SOB and admitted with acute on chronic respiratory failure with hypoxia, COPD exacerbation and acute on chronic CHF. PMH significant for COPD, chronic respiratory failure, CAD, CHF with reduced EF, CKD 3A, HTN, HLD, DM2 (well controlled), iron  deficiency anemia, prior admission 03/20/24-03/26/24 for right distal tib-fib fracture requiring ORIF.  RD working remotely. Consult received for low sodium and fluid restricted diet education to aid in management of CHF.   Palliative Care team following. Patient lives at home with her son who assists with her ADL's. Her mobility has been limited since tib-fib fracture last year.   RD attempted to reach pt via phone call to room and pt's son, Zachary, via cellphone however voicemail box not set up. Will attempt to speak with pt and/or her son at bedside prior to discharge.   RD will add low sodium nutrition handout to AVS to provide a reference at time of discharge for home reference. However given one meal completion of <50% and patients increased nutrition risk secondary to chronic co-morbidities, COPD exacerbation and advanced age, pt wound benefit from an oral  nutrition supplement and diet liberalization to limit restrictions and augment intake.   Meal completions: 1/25: 40% lunch  Limited weight history on file to review within the last year. Pt's weight appears to remain about 79-81 kg within this time frame.  Admission weight: 81.4 kg  Current weigh: 78.2 kg 3.9% weight change x 2 days d/t lasix  administration  Medications: abx, SSI 0-9 units TID, solu-medrol , protonix  daily  Labs:   Chloride 97 BUN 39 Cr 2.20 FFR 23 CBG's 122, 135 (1/25) HgbA1c 5.4% (1/23)  NUTRITION - FOCUSED PHYSICAL EXAM: RD working remotely. Deferred to in person assessment.   Diet Order:   Diet Order             Diet 2 gram sodium Room service appropriate? Yes with Assist; Fluid consistency: Thin; Fluid restriction: 1500 mL Fluid  Diet effective now                   EDUCATION NEEDS:   Education needs have been addressed  Skin:  Skin Assessment: Reviewed RN Assessment  Last BM:  unknown/PTA  Height:   Ht Readings from Last 1 Encounters:  08/17/24 5' 1 (1.549 m)    Weight:   Wt Readings from Last 1 Encounters:  08/19/24 78.2 kg    Ideal Body Weight:  47.7 kg  BMI:  Body mass index is 32.57 kg/m.  Estimated Nutritional Needs:   Kcal:  1300-1500  Protein:  65-80g  Fluid:  1.5L  Royce Maris, RDN, LDN Clinical Nutrition See [facility] for contact information.

## 2024-08-19 NOTE — Evaluation (Signed)
 Occupational Therapy Evaluation Patient Details Name: Beverly Wheeler MRN: [id number] DOB: 1946/01/20 Today's Date: 08/19/2024   History of Present Illness   Pt is a 79 y.o. female admitted 1/24 for acute on chronic respiratory failure. Pt with anemia and given one unit blood. Pt with hallucinations during stay. PMH:  COPD (2L at baseline), CAD, CHF with reduced EF, CKD 3A, HTN, HLD, DM 2, iron  deficiency anemia and hospitalization from 03/20/2024 to 03/26/2024 for R distal tib-fib fx requiring ORIF     Clinical Impressions Pt admitted with the above diagnosis and has the deficits outlined below. Pt would benefit from cont OT to attempt to reach her baseline level of functioning so she can return home to live with her son, Beverly Wheeler. Beverly Wheeler has been caring for his mother for some time now. After speaking with him, it appears she is close to her baseline level of functioning but therapist feels there is room for more independence being she broke her leg last August and has never fully returned to her baseline.  For this reason, recommend HHOT at d/c to continue to progress pt with her rehab and mobility. Will continue to see acutely for mobility and balance tasks during adls.     If plan is discharge home, recommend the following:   A little help with walking and/or transfers;A lot of help with bathing/dressing/bathroom;Assistance with cooking/housework;Direct supervision/assist for medications management;Direct supervision/assist for financial management;Assist for transportation;Help with stairs or ramp for entrance;Supervision due to cognitive status     Functional Status Assessment   Patient has had a recent decline in their functional status and demonstrates the ability to make significant improvements in function in a reasonable and predictable amount of time.     Equipment Recommendations   None recommended by OT     Recommendations for Other Services          Precautions/Restrictions   Precautions Precautions: Fall Recall of Precautions/Restrictions: Impaired Restrictions Weight Bearing Restrictions Per Provider Order: No     Mobility Bed Mobility Overal bed mobility: Needs Assistance Bed Mobility: Supine to Sit, Sit to Supine     Supine to sit: Min assist, HOB elevated, Used rails Sit to supine: Min assist   General bed mobility comments: increased time, assist with trunk and to get BLEs back in bed    Transfers Overall transfer level: Needs assistance Equipment used: Rolling walker (2 wheels) Transfers: Sit to/from Stand Sit to Stand: Min assist           General transfer comment: increased time to power up. Retropulsive. Bracing BLE on bed.      Balance Overall balance assessment: Needs assistance Sitting-balance support: Feet supported, No upper extremity supported Sitting balance-Leahy Scale: Fair     Standing balance support: Bilateral upper extremity supported, During functional activity, Reliant on assistive device for balance Standing balance-Leahy Scale: Poor                             ADL either performed or assessed with clinical judgement   ADL Overall ADL's : Needs assistance/impaired Eating/Feeding: Set up;Sitting Eating/Feeding Details (indicate cue type and reason): required assist to open packages. Pt with a tremor when feeding self that she states she always has but felt worse today. Grooming: Wash/dry face;Oral care;Set up;Sitting   Upper Body Bathing: Minimal assistance;Sitting   Lower Body Bathing: Maximal assistance;Sit to/from stand;Cueing for compensatory techniques Lower Body Bathing Details (indicate cue type and reason): assist  to reach below knees and to stand and was bottom and peri area Upper Body Dressing : Minimal assistance;Sitting   Lower Body Dressing: Maximal assistance;Sit to/from stand;Cueing for compensatory techniques Lower Body Dressing Details (indicate  cue type and reason): Pts so assists with all LE dressing at home. Toilet Transfer: Moderate assistance;Stand-pivot;BSC/3in1   Toileting- Clothing Manipulation and Hygiene: Moderate assistance;Sit to/from stand;Cueing for compensatory techniques Toileting - Clothing Manipulation Details (indicate cue type and reason): Pt unsteady on her feet with a posterior lean. Pt wears pull ups at home for toilting and always is by the [facility]. Son helps her onto the commode     Functional mobility during ADLs: Moderate assistance General ADL Comments: Pt limited with all adls and has been for some time now.  Son assists with all adls and after speaking to him feels he can continue to asisst.  He only leaves her to go to the grocery store.     Vision Baseline Vision/History: 0 No visual deficits Ability to See in Adequate Light: 0 Adequate Patient Visual Report: No change from baseline Vision Assessment?: No apparent visual deficits Additional Comments: able to read calendar and clock in room and find items in room to R and L     Perception Perception: Within Functional Limits       Praxis Praxis: [facility]       Pertinent Vitals/Pain Pain Assessment Pain Assessment: No/denies pain     Extremity/Trunk Assessment Upper Extremity Assessment Upper Extremity Assessment: Overall WFL for tasks assessed   Lower Extremity Assessment Lower Extremity Assessment: Defer to PT evaluation   Cervical / Trunk Assessment Cervical / Trunk Assessment: Kyphotic   Communication Communication Communication: Impaired Factors Affecting Communication: Reduced clarity of speech   Cognition Arousal: Alert Behavior During Therapy: WFL for tasks assessed/performed Cognition: History of cognitive impairments             OT - Cognition Comments: pt appears to be at baseline per son Beverly Wheeler. Pt is having some halllucinations while in hospital                 Following commands: Impaired Following commands  impaired: Only follows one step commands consistently, Follows one step commands with increased time     Cueing  General Comments   Cueing Techniques: Verbal cues  Pt is mostly likely very close to her baseline. Pt feels weak and requires mod assist with most adls and min to mod with mobility.   Exercises     Shoulder Instructions      Home Living Family/patient expects to be discharged to:: Private residence Living Arrangements: Children Available Help at Discharge: Family;Available 24 hours/day Type of Home: House Home Access: Stairs to enter;Ramped entrance Entrance Stairs-Number of Steps: 6 (removeable ramp) Entrance Stairs-Rails: Right Home Layout: Able to live on main level with bedroom/bathroom     Bathroom Shower/Tub: Tub/shower unit;Sponge bathes at baseline   [employer]: Standard Bathroom Accessibility: Yes How Accessible: Accessible via walker Home Equipment: Rollator (4 wheels);Wheelchair - Biomedical Scientist (2 wheels)   Additional Comments: Spoke with son and verified above      Prior Functioning/Environment Prior Level of Function : Needs assist       Physical Assist : ADLs (physical);Mobility (physical) Mobility (physical): Transfers;Gait;Stairs ADLs (physical): Bathing;Dressing;Toileting;IADLs Mobility Comments: Primary w/c mobility. Does amb short distance with rollator from bed to recliner with assist. ADLs Comments: Sponge bathes at baseline. Son assists with all ADLs.    OT Problem List: Decreased activity tolerance;Impaired  balance (sitting and/or standing);Decreased cognition;Decreased knowledge of use of DME or AE;Decreased knowledge of precautions;Obesity   OT Treatment/Interventions: Self-care/ADL training;DME and/or AE instruction;Energy conservation;Therapeutic activities;Balance training      OT Goals(Current goals can be found in the care plan section)   Acute Rehab OT Goals Patient Stated Goal: to get back  home OT Goal Formulation: With patient/family Time For Goal Achievement: 09/02/24 Potential to Achieve Goals: Fair ADL Goals Pt Will Transfer to Toilet: with contact guard assist;stand pivot transfer Pt Will Perform Toileting - Clothing Manipulation and hygiene: with min assist;sit to/from stand Additional ADL Goal #1: Pt will state 3 things she can do at home to save energy during adls so she is not so fatigued at night without cues.   OT Frequency:  Min 2X/week    Co-evaluation              AM-PAC OT 6 Clicks Daily Activity     Outcome Measure Help from another person eating meals?: A Little Help from another person taking care of personal grooming?: A Little Help from another person toileting, which includes using toliet, bedpan, or urinal?: A Lot Help from another person bathing (including washing, rinsing, drying)?: A Lot Help from another person to put on and taking off regular upper body clothing?: A Little Help from another person to put on and taking off regular lower body clothing?: A Lot 6 Click Score: 15   End of Session Equipment Utilized During Treatment: Rolling walker (2 wheels);Oxygen  Nurse Communication: Mobility status  Activity Tolerance: Patient limited by fatigue Patient left: in bed;with bed alarm set;with call bell/phone within reach  OT Visit Diagnosis: Unsteadiness on feet (R26.81);Other abnormalities of gait and mobility (R26.89);Muscle weakness (generalized) (M62.81)                Time: 9161-9095 OT Time Calculation (min): 26 min Charges:  OT General Charges $OT Visit: 1 Visit OT Evaluation $OT Eval Moderate Complexity: 1 Mod  Joshua Silvano Dragon 08/19/2024, 10:18 AM

## 2024-08-19 NOTE — Progress Notes (Signed)
 "  Chaplain responded to a consult request for Advance Directive education. Chaplain introduced spiritual care and offered support in the setting of inpatient admission. Ms Boza was accepting of education re advance directives (noted below). She stated she thinks she would like life prolonging care when provided education regarding the living will. Her son visits her regularly and chaplain encouraged her to look over the documents with him and request a follow up visit if she would like to discuss more or complete.  Chaplain provided the Advance Directive packet as well as education on Advance Directives-documents an individual completes to communicate their health care directions in advance of a time when they may need them. Chaplain informed pt the documents which may be completed here in the hospital are the Living Will and Health Care Power of [place].   Chaplain informed that the Health Care Power of [place] is a legal document in which an individual names another person, their Health Care Agent, to make health care decisions when the individual is not able to make them for themselves. The Health Care Agent's function can be temporary or permanent depending on the pt's ability to make and communicate those decisions independently. Chaplain informed pt in the absence of a Health Care Power of Attorney, the state of [place]  directs health care providers to look to the following individuals in the order listed: legal guardian; an attorney?in?fact under a general power of attorney (POA) if that POA includes the right to make health care decisions; a husband or wife; a majority of parents and adult children; a majority of adult brothers and sisters; or an individual who has an established relationship with you, who is acting in good faith and who can convey your wishes.  If none of these person are available or willing to make medical decisions on a patient's behalf, the law allows the patient's  doctor to make decisions for them as long as another doctor agrees with those decisions.  Chaplain also informed the patient that the Health Care agent has no decision-making authority over any affairs other than those related to his or her medical care.   The chaplain further educated the pt that a Living Will is a legal document that allows an individual to state his or her desire not to receive life-prolonging measures in the event that they have a condition that is incurable and will result in their death in a short period of time; they are unconscious, and doctors are confident that they will not regain consciousness; and/or they have advanced dementia or other substantial and irreversible loss of mental function. The chaplain informed pt that life-prolonging measures are medical treatments that would only serve to postpone death, including breathing machines, kidney dialysis, antibiotics, artificial nutrition and hydration (tube feeding), and similar forms of treatment and that if an individual is able to express their wishes, they may also make them known without the use of a Living Will, but in the event that an individual is not able to express their wishes themselves, a Living Will allows medical providers and the pt's family and friends ensure that they are not making decisions on the pt's behalf, but rather serving as the pt's voice to convey decisions the pt has already made.   The patient is aware that the decision to create an advance directive is theirs alone and they may chose not to complete the documents or may chose to complete one portion or both.  The patient was informed that they can  revoke the documents at any time by striking through them and writing void or by completing new documents, but that it is also advisable that the individual verbally notify interested parties that their wishes have changed.  They are also aware that the document must be signed in the presence of a notary  public and two witnesses and that this can be done while the patient is still admitted to the hospital or after discharge in the community. If they decide to complete Advance Directives after being discharged from the hospital, they have been advised to notify all interested parties and to provide those documents to their physicians and loved ones in addition to bringing them to the hospital in the event of another hospitalization.   The chaplain informed the pt that if they desire to proceed with completing Advance Directive Documentation while they are still admitted, notary services are typically available at [facility] between the hours of 1:00 and 3:30 Monday-Thursday.    When the patient is ready to have these documents completed, the patient should request that their nurse place a spiritual care consult and indicate that the patient is ready to have their advance directives notarized so that arrangements for witnesses and notary public can be made.  Please page spiritual care if the patient desires further education or has questions.       Alan HERO. Davee Lomax, M.Div. [facility] Chaplain Pager [phone] Office [phone]     08/19/24 1237  Spiritual Encounters  Type of Visit Initial  Care provided to: Patient  Reason for visit Advance directives  OnCall Visit No  Advance Directives (For Healthcare)  Does Patient Have a Medical Advance Directive? No  Would patient like information on creating a medical advance directive? Yes (Inpatient - patient defers creating a medical advance directive at this time - Information given)    "

## 2024-08-20 DIAGNOSIS — J9601 Acute respiratory failure with hypoxia: Secondary | ICD-10-CM | POA: Diagnosis not present

## 2024-08-20 LAB — CBC WITH DIFFERENTIAL/PLATELET
Abs Immature Granulocytes: 0.05 10*3/uL (ref 0.00–0.07)
Basophils Absolute: 0 10*3/uL (ref 0.0–0.1)
Basophils Relative: 0 %
Eosinophils Absolute: 0 10*3/uL (ref 0.0–0.5)
Eosinophils Relative: 0 %
HCT: 28.4 % — ABNORMAL LOW (ref 36.0–46.0)
Hemoglobin: 9.5 g/dL — ABNORMAL LOW (ref 12.0–15.0)
Immature Granulocytes: 1 %
Lymphocytes Relative: 11 %
Lymphs Abs: 0.9 10*3/uL (ref 0.7–4.0)
MCH: 32.1 pg (ref 26.0–34.0)
MCHC: 33.5 g/dL (ref 30.0–36.0)
MCV: 95.9 fL (ref 80.0–100.0)
Monocytes Absolute: 0.3 10*3/uL (ref 0.1–1.0)
Monocytes Relative: 3 %
Neutro Abs: 7.2 10*3/uL (ref 1.7–7.7)
Neutrophils Relative %: 85 %
Platelets: 147 10*3/uL — ABNORMAL LOW (ref 150–400)
RBC: 2.96 MIL/uL — ABNORMAL LOW (ref 3.87–5.11)
RDW: 12.7 % (ref 11.5–15.5)
WBC: 8.5 10*3/uL (ref 4.0–10.5)
nRBC: 0 % (ref 0.0–0.2)

## 2024-08-20 LAB — GLUCOSE, CAPILLARY
Glucose-Capillary: 97 mg/dL (ref 70–99)
Glucose-Capillary: 112 mg/dL — ABNORMAL HIGH (ref 70–99)
Glucose-Capillary: 100 mg/dL — ABNORMAL HIGH (ref 70–99)
Glucose-Capillary: 201 mg/dL — ABNORMAL HIGH (ref 70–99)
Glucose-Capillary: 124 mg/dL — ABNORMAL HIGH (ref 70–99)

## 2024-08-20 LAB — BASIC METABOLIC PANEL WITH GFR
Anion gap: 8 (ref 5–15)
BUN: 42 mg/dL — ABNORMAL HIGH (ref 8–23)
CO2: 33 mmol/L — ABNORMAL HIGH (ref 22–32)
Calcium: 8.8 mg/dL — ABNORMAL LOW (ref 8.9–10.3)
Chloride: 99 mmol/L (ref 98–111)
Creatinine, Ser: 2.12 mg/dL — ABNORMAL HIGH (ref 0.44–1.00)
GFR, Estimated: 24 mL/min — ABNORMAL LOW
Glucose, Bld: 112 mg/dL — ABNORMAL HIGH (ref 70–99)
Potassium: 5.9 mmol/L — ABNORMAL HIGH (ref 3.5–5.1)
Sodium: 140 mmol/L (ref 135–145)

## 2024-08-20 LAB — MAGNESIUM: Magnesium: 1.9 mg/dL (ref 1.7–2.4)

## 2024-08-20 MED ORDER — SODIUM ZIRCONIUM CYCLOSILICATE 10 G PO PACK
10.0000 g | PACK | Freq: Once | ORAL | Status: AC
  Administered 2024-08-20: 10 g via ORAL
  Filled 2024-08-20: qty 1

## 2024-08-20 MED ORDER — LOPERAMIDE HCL 2 MG PO CAPS
2.0000 mg | ORAL_CAPSULE | Freq: Four times a day (QID) | ORAL | Status: DC | PRN
  Administered 2024-08-20: 2 mg via ORAL
  Filled 2024-08-20: qty 1

## 2024-08-20 MED ORDER — SODIUM CHLORIDE 0.9 % IV SOLN: INTRAVENOUS | Status: AC

## 2024-08-20 NOTE — Progress Notes (Signed)
 "  Palliative Medicine Inpatient Follow Up Note HPI:  Beverly Wheeler. Beverly Wheeler is a 79 year old female with a past medical history significant for hypoxic respiratory failure on 2 L nasal cannula, COPD, coronary artery disease, congestive heart failure, chronic kidney disease stage IIIa, type 2 diabetes mellitus, hypertension, hyperlipidemia, iron  deficiency anemia.  Beverly Wheeler was admitted to the hospital with shortness of breath.  She is being treated for heart failure exacerbation as well as COPD exacerbation.  Palliative care has been asked to support additional goals of care conversations in the setting of chronic disease burden.   Today's Discussion 08/20/2024  I reviewed the chart notes including nursing notes from [facility], progress notes from Dr. Cheryle, & Bernarda Maris. I also reviewed vital signs which has been stable, nursing flowsheets - oral intake has diminished, medication administrations record, labs BMP - Cr downtrending and CBC from today, and imaging - no imaging within the last 24 hours.  I met with Beverly Wheeler at bedside this morning. She is awake and alert in NAD. She denies pain, nausea, and shortness of breath. We discussed her current clinical condition and she feels she has made some good improvements.   I was able to call and speak to patients son, Beverly Wheeler who endorses concerns related to CHF and what this means. Education provided on the disease process itself. Reviewed medication compliance, dietary management, and lifestyle modifications. I was able to request the staff provide patients son with further information on heart failure and what this means in the grand scheme of her life.  I discussed with Beverly Wheeler the chronic disease burden and the impact it has with recurring re-hospitalizations. Beverly Wheeler asked about patient prior need for a blood transfusion. I shared that I would request he get a more formalized update form the primary medical team though I also reviewed the progress  notes with him and discussed patients iron  deficiency anemia.   Questions and concerns addressed/Palliative Support Provided.   Objective Assessment: Vital Signs Vitals:   08/20/24 0756 08/20/24 1147  BP: (!) 126/53 (!) (P) 122/45  Pulse: (!) 50   Resp: (!) 22   Temp:    SpO2: 99%     Intake/Output Summary (Last 24 hours) at 08/20/2024 1525 Last data filed at 08/20/2024 0900 Gross per 24 hour  Intake 363 ml  Output 400 ml  Net -37 ml   Last Weight  Most recent update: 08/20/2024  3:48 AM    Weight  76.3 kg (168 lb 3.4 oz)            Gen:  Elderly Caucasian F chronically ill in appearance HEENT: moist mucous membranes CV: Regular rate and rhythm  PULM:  On 2LPM [place], breathing is nonlabored ABD: soft/nontender  EXT: RLE edema more pronounced than LLE Neuro: Alert and oriented x3  SUMMARY OF RECOMMENDATIONS   DNAR/DNI  MOST / DNR Form Completed, paper copy placed onto the chart electric copy can be found in [facility]  Patients shared understanding of restrictions associated w/ heart failure  Education provided to patients son on chronic disease burden  The PMT will remain available as needed ______________________________________________________________________________________ Beverly Wheeler [facility] Palliative Medicine Team Team Cell Phone: [phone] Please utilize secure chat with additional questions, if there is no response within 30 minutes please call the above phone number  I personally spent a total of 35 minutes in the care of the patient today including preparing to see the patient, getting/reviewing separately obtained history, performing a medically appropriate exam/evaluation, counseling and  educating, referring and communicating with other health care professionals, documenting clinical information in the EHR, and coordinating care.     "

## 2024-08-20 NOTE — Progress Notes (Signed)
 Nutrition Follow-up  DOCUMENTATION CODES:  Not applicable  INTERVENTION:  Continue 2 gram sodium diet Encourage adequate oral intake Encourage addition of protein containing snacks between meals to optimize nutritional intake Ensure Plus High Protein po BID, each supplement provides 350 kcal and 20 grams of protein. MVI with minerals daily High calorie, high protein nutrition therapy handout added to AVS  NUTRITION DIAGNOSIS:  Increased nutrient needs related to acute illness (acute COPD exacerbation) as evidenced by estimated needs. - remains applicable  GOAL:  Patient will meet greater than or equal to 90% of their needs - goal unmet  MONITOR:  Supplement acceptance, Diet advancement, PO intake, Labs  REASON FOR ASSESSMENT:  Consult Diet education (low sodium, fluid restricted diet education)  ASSESSMENT:  Pt presented with worsening SOB and admitted with acute on chronic respiratory failure with hypoxia, COPD exacerbation and acute on chronic CHF. PMH significant for COPD, chronic respiratory failure, CAD, CHF with reduced EF, CKD 3A, HTN, HLD, DM2 (well controlled), iron  deficiency anemia, prior admission 03/20/24-03/26/24 for right distal tib-fib fracture requiring ORIF.  Spoke with patient at bedside. She is in good spirits.  Currently during admission, she reports eating little but her appetite is somewhat better than it had been on admission.   At baseline, she reports that she eats when she's hungry. Within the last week she had a poor appetite d/t acute illness. Average intake witin the last week was 1-2 meals which is normal however the quantity is reduced.   Observed to be edentulous. Denies difficulty chewing most foods and tolerating textures of foods here well.    Baseline intake is 2 meals. Eats a bowl of cereal with bananas. Lunch is about half a sandwich with ham and cheese with macaroni salad from grocery. After lunch, does not recall intake. Sometimes she eats  a couple chips with her sandwich but does not recall eating a large serving. Son cooks at home on occasion. Sometimes goes and gets some grilled shrimp with coleslaw.   She drinks water and iced tea. About 24 oz daily She does not consume protein supplements at home. Received Ensure here which she enjoys. Takes a MVI daily and B12 at home.   Spoke with pt's son via phone call. He states that it has been a while since she has not had cereal much lately. They typically sleep in so their first meal is generally lunch between 10am-12pm and then next meal is dinner which is often ordered from take out. Encouraged increased protein intake with meals and as small snacks between meals. Handout added to AVS. Suspect pt is not likely exceeding nutrient limits of sodium based on dietary recall, though discussed ways to be mindful of continuing to limit sodium when ordering out.   Pt reports since her tib-fib fracture last year she has been less mobile though working on rehab. She has been primarily wheelchair bound but also uses a walker intermittently. Diarrhea today. Typically normal bowels.   Pt reports that she had been weighing herself daily at home up until her fracture last year and d/t limited mobility has stopped.  She mentions that about 2-3 years ago, she weighed about 185 lbs. Currently she recalls being down to 166 lbs.  Unknown dry weight.   Admit weight: 81.4 kg Current weight: 76.3 kg Had been receiving lasix .   Medications: SSI 0-9 units TID, solu-medrol  daily, protonix , lokelma  (once)  Labs:  Potassium 5.9 BUN 42 Cr 2.12 GFR 24 CBG's 97-141 x24 hours  NUTRITION -  FOCUSED PHYSICAL EXAM:  Flowsheet Row Most Recent Value  Orbital Region No depletion  Upper Arm Region Mild depletion  Thoracic and Lumbar Region No depletion  Buccal Region No depletion  Temple Region Mild depletion  Clavicle Bone Region No depletion  Clavicle and Acromion Bone Region Mild depletion  Scapular Bone  Region Mild depletion  Dorsal Hand Moderate depletion  Patellar Region Moderate depletion  Anterior Thigh Region Moderate depletion  Posterior Calf Region Moderate depletion  Edema (RD Assessment) Mild  [non-pitting BLE]  Hair Reviewed  Eyes Reviewed  Mouth Other (Comment)  [edentulous]  Skin Reviewed  Nails Reviewed    Diet Order:   Diet Order             Diet 2 gram sodium Room service appropriate? Yes with Assist; Fluid consistency: Thin; Fluid restriction: 1500 mL Fluid  Diet effective now                   EDUCATION NEEDS:   Education needs have been addressed  Skin:  Skin Assessment: Reviewed RN Assessment  Last BM:  1/27 type 7 x2 large  Height:   Ht Readings from Last 1 Encounters:  08/17/24 5' 1 (1.549 m)    Weight:   Wt Readings from Last 1 Encounters:  08/20/24 76.3 kg    Ideal Body Weight:  47.7 kg  BMI:  Body mass index is 31.78 kg/m.  Estimated Nutritional Needs:   Kcal:  1300-1500  Protein:  65-80g  Fluid:  1.5L  Royce Maris, RDN, LDN Clinical Nutrition See [facility] for contact information.

## 2024-08-20 NOTE — Progress Notes (Signed)
 " PROGRESS NOTE    Beverly Wheeler  FMW:[id number] DOB: 09/04/1945 DOA: 08/17/2024 PCP: Jordan, Betty G, MD   Brief Narrative:   79 y.o. female with medical history significant of chronic hypoxic respiratory failure 2 L nasal cannula, COPD, CAD, CHF with reduced EF, CKD 3A, HTN, HLD, DM 2, iron  deficiency anemia and hospitalization from 03/20/2024 to 03/26/2024 for right distal tib-fib fracture requiring ORIF presented with worsening shortness of breath.  On presentation, she was satting in the mid 90s on 3 L nasal cannula.  Creatinine of 1.71, proBNP of 5290.  Chest x-ray showed central bronchovascular crowding and cardiomegaly.  She was started on IV Lasix  and steroids.  Subsequently, Lasix  held on 08/19/2024 due to uptrending creatinine.  Assessment & Plan:   Acute on chronic respiratory failure with hypoxia COPD exacerbation Acute on chronic systolic CHF -Wears 2 L oxygen  via nasal cannula at home.  Required 3 L on presentation.  Respiratory status improving: Currently on 2 L oxygen . - Strict input and output.  Daily weights.  Continue Nebivolol .  Creatinine trending upwards: DC'd IV Lasix  on 08/19/2024 - 2D echo showed EF of 30 to 35% with grade 2 diastolic dysfunction.   - Continue steroids.  Continue current nebs and oral Zithromax .  Acute metabolic encephalopathy - Patient was lethargic on presentation.  Possibly from above.  Mental status much improved.  Monitor mental status.  Multivessel CAD Positive troponins Hypertension Hyperlipidemia - No chest pain.  CABG was recommended in 2015 by cardiology but patient declined CABG.  Currently no chest pain.  Outpatient follow-up with cardiology.  Continue statin, ezetimibe , Nebivolol   - High sensitive troponins did not trend up.  Diabetes mellitus type 2 with hyperglycemia - A1c 5.4.  Continue CBGs with SSI  AKI on CKD stage IIIb - Patient creatinine of 1.2-1.4.  Creatinine 1.71 on presentation.  Creatinine worsening to 2.20 on  08/19/2024.  Creatinine 2.12 today.  Off Lasix .  Will give gentle hydration for 12 hours today.  Monitor.  Hyperkalemia - Potassium 5.9 today.  Will give one dose of Lokelma . Repeat AM labs Anxiety/depression - Continue escitalopram   Iron  deficiency anemia - Baseline hemoglobin of 8-9.  Hemoglobin 7.1 on 08/18/2024.  Received 1 unit packed red cells.  Hemoglobin 9.5 this morning.    Physical deconditioning - PT/OT recommended home health PT/OT  Goals of care - Palliative care following  DVT prophylaxis:  Lovenox  Code Status: DNR Family Communication: None at bedside Disposition Plan: Status is: inpatient because: Of severity of illness    Consultants: Palliative care  Procedures: 2D echo   Antimicrobials: Zithromax  from 08/17/2024 onwards   Subjective: Patient seen and examined at bedside.  No vomiting or fever, worsening abdominal pain reported.  Still short of breath with mild exertion but feels better.   Objective: Vitals:   08/19/24 1920 08/19/24 2021 08/20/24 0016 08/20/24 0345  BP: (!) 145/54  (!) 129/53 127/67  Pulse: 69  61 (!) 59  Resp: 20  17 17   Temp: 98.2 F (36.8 C)  98.2 F (36.8 C) 98.1 F (36.7 C)  TempSrc: Oral  Oral Oral  SpO2: 98% 100% 100% 97%  Weight:    76.3 kg  Height:        Intake/Output Summary (Last 24 hours) at 08/20/2024 0658 Last data filed at 08/20/2024 0348 Gross per 24 hour  Intake 363 ml  Output 400 ml  Net -37 ml   Filed Weights   08/18/24 0326 08/19/24 0450 08/20/24 0345  Weight: 80  kg 78.2 kg 76.3 kg    Examination:  General: Currently on 2 L oxygen  via nasal cannula.  No acute distress ENT/neck: No palpable neck masses or JVD elevation respiratory: Bilateral decreased breath sounds at bases with scattered crackles and some wheezing  CVS: Heart rate mostly controlled; S1 and S2 are heard Abdominal: Soft, obese, nontender, distended slightly; no organomegaly, bowel sounds are normally heard  extremities: No clubbing;  mild lower extremity edema present  CNS: Alert and oriented.  No focal neurologic deficit.  Able to move extremities Lymph: No obvious palpable lymphadenopathy Skin: No obvious petechiae/lesions  psych: Affect is mostly flat with no signs of agitation  musculoskeletal: No obvious joint tenderness/erythema   Data Reviewed: I have personally reviewed following labs and imaging studies  CBC: Recent Labs  Lab 08/17/24 1315 08/17/24 1323 08/18/24 0301 08/18/24 0948 08/19/24 0119 08/20/24 0314  WBC 8.9  --  7.0 10.1 10.0 8.5  NEUTROABS 5.8  --   --  8.7* 8.0* 7.2  HGB 8.4* 8.8*  8.8* 7.2* 7.1* 8.5* 9.5*  HCT 26.1* 26.0*  26.0* 22.1* 21.1* 25.0* 28.4*  MCV 99.2  --  95.3 94.6 94.7 95.9  PLT 155  --  148* 148* 153 147*   Basic Metabolic Panel: Recent Labs  Lab 08/17/24 1315 08/17/24 1323 08/18/24 0301 08/18/24 0948 08/19/24 0119 08/20/24 0314  NA 140 140  141 140 138 138 140  K 4.5 4.5  4.5 5.8* 5.8* 5.1 5.9*  CL 102 100 101 100 97* 99  CO2 29  --  34* 31 32 33*  GLUCOSE 150* 150* 146* 123* 102* 112*  BUN 22 27* 26* 29* 39* 42*  CREATININE 1.71* 1.80* 1.80* 1.91* 2.20* 2.12*  CALCIUM  9.5  --  9.2 9.3 8.7* 8.8*  MG  --   --   --  1.8 1.8 1.9   GFR: Estimated Creatinine Clearance: 21.1 mL/min (A) (by C-G formula based on SCr of 2.12 mg/dL (H)). Liver Function Tests: Recent Labs  Lab 08/16/24 1614 08/17/24 1315 08/18/24 0301 08/18/24 0948  AST 8 16 27  33  ALT 5 7 8 8   ALKPHOS 124 120 101 96  BILITOT <0.2 0.3 0.2 0.3  PROT 6.3 6.7 6.1* 6.1*  ALBUMIN  3.9 3.7 3.5 3.5   No results for input(s): LIPASE, AMYLASE in the last 168 hours. Recent Labs  Lab 08/17/24 1758  AMMONIA 21   Coagulation Profile: No results for input(s): INR, PROTIME in the last 168 hours. Cardiac Enzymes: No results for input(s): CKTOTAL, CKMB, CKMBINDEX, TROPONINI in the last 168 hours. BNP (last 3 results) Recent Labs    08/17/24 1315  PROBNP 5,290.0*   HbA1C: No  results for input(s): HGBA1C in the last 72 hours.  CBG: Recent Labs  Lab 08/18/24 2141 08/19/24 1148 08/19/24 1519 08/19/24 2113 08/20/24 0546  GLUCAP 122* 111* 141* 131* 97   Lipid Profile: No results for input(s): CHOL, HDL, LDLCALC, TRIG, CHOLHDL, LDLDIRECT in the last 72 hours. Thyroid  Function Tests: No results for input(s): TSH, T4TOTAL, FREET4, T3FREE, THYROIDAB in the last 72 hours. Anemia Panel: No results for input(s): VITAMINB12, FOLATE, FERRITIN, TIBC, IRON , RETICCTPCT in the last 72 hours. Sepsis Labs: No results for input(s): PROCALCITON, LATICACIDVEN in the last 168 hours.  Recent Results (from the past 240 hours)  Resp panel by RT-PCR (RSV, Flu A&B, Covid) Anterior Nasal Swab     Status: None   Collection Time: 08/17/24  1:15 PM   Specimen: Anterior Nasal Swab  Result  Value Ref Range Status   SARS Coronavirus 2 by RT PCR NEGATIVE NEGATIVE Final   Influenza A by PCR NEGATIVE NEGATIVE Final   Influenza B by PCR NEGATIVE NEGATIVE Final    Comment: (NOTE) The Xpert Xpress SARS-CoV-2/FLU/RSV plus assay is intended as an aid in the diagnosis of influenza from Nasopharyngeal swab specimens and should not be used as a sole basis for treatment. Nasal washings and aspirates are unacceptable for Xpert Xpress SARS-CoV-2/FLU/RSV testing.  Fact Sheet for Patients: bloggercourse.com  Fact Sheet for Healthcare Providers: seriousbroker.it  This test is not yet approved or cleared by the [place]  FDA and has been authorized for detection and/or diagnosis of SARS-CoV-2 by FDA under an Emergency Use Authorization (EUA). This EUA will remain in effect (meaning this test can be used) for the duration of the COVID-19 declaration under Section 564(b)(1) of the Act, 21 U.S.C. section 360bbb-3(b)(1), unless the authorization is terminated or revoked.     Resp Syncytial Virus by PCR  NEGATIVE NEGATIVE Final    Comment: (NOTE) Fact Sheet for Patients: bloggercourse.com  Fact Sheet for Healthcare Providers: seriousbroker.it  This test is not yet approved or cleared by the [place]  FDA and has been authorized for detection and/or diagnosis of SARS-CoV-2 by FDA under an Emergency Use Authorization (EUA). This EUA will remain in effect (meaning this test can be used) for the duration of the COVID-19 declaration under Section 564(b)(1) of the Act, 21 U.S.C. section 360bbb-3(b)(1), unless the authorization is terminated or revoked.  Performed at [facility], [address], [place], [place] [address]          Radiology Studies: VAS US  LOWER EXTREMITY VENOUS (DVT) Result Date: 08/18/2024  Lower Venous DVT Study Patient Name:  LADELLE TEODORO [facility]  Date of Exam:   08/18/2024 Medical Rec #: [id number]            Accession #:    [id number] Date of Birth: April 27, 1946            Patient Gender: F Patient Age:   63 years Exam Location:  [facility] Procedure:      VAS US  LOWER EXTREMITY VENOUS (DVT) Referring Phys: SOPHIE MAO --------------------------------------------------------------------------------  Indications: Edema.  Limitations: Patient immobility - poor positioning of LLE. Comparison Study: Previous exam on 11/29/2017 was negative for DVT. Performing Technologist: Ezzie Potters RVT, RDMS  Examination Guidelines: A complete evaluation includes B-mode imaging, spectral Doppler, color Doppler, and power Doppler as needed of all accessible portions of each vessel. Bilateral testing is considered an integral part of a complete examination. Limited examinations for reoccurring indications may be performed as noted. The reflux portion of the exam is performed with the patient in reverse Trendelenburg.  +---------+---------------+---------+-----------+----------+--------------+ RIGHT     CompressibilityPhasicitySpontaneityPropertiesThrombus Aging +---------+---------------+---------+-----------+----------+--------------+ CFV      Full           Yes      Yes                                 +---------+---------------+---------+-----------+----------+--------------+ SFJ      Full                                                        +---------+---------------+---------+-----------+----------+--------------+ FV Prox  Full           Yes      Yes                                 +---------+---------------+---------+-----------+----------+--------------+ FV Mid   Full           Yes      Yes                                 +---------+---------------+---------+-----------+----------+--------------+ FV DistalFull           Yes      Yes                                 +---------+---------------+---------+-----------+----------+--------------+ PFV      Full                                                        +---------+---------------+---------+-----------+----------+--------------+ POP      Full           Yes      Yes                                 +---------+---------------+---------+-----------+----------+--------------+ PTV      Full                                                        +---------+---------------+---------+-----------+----------+--------------+ PERO     Full                                                        +---------+---------------+---------+-----------+----------+--------------+   +---------+---------------+---------+-----------+----------+-------------------+ LEFT     CompressibilityPhasicitySpontaneityPropertiesThrombus Aging      +---------+---------------+---------+-----------+----------+-------------------+ CFV      Full           Yes      Yes                                      +---------+---------------+---------+-----------+----------+-------------------+ SFJ      Full                                                              +---------+---------------+---------+-----------+----------+-------------------+ FV Prox  Full           Yes      Yes                                      +---------+---------------+---------+-----------+----------+-------------------+  FV Mid   Full           Yes      Yes                                      +---------+---------------+---------+-----------+----------+-------------------+ FV DistalFull           Yes      Yes                                      +---------+---------------+---------+-----------+----------+-------------------+ PFV      Full                                                             +---------+---------------+---------+-----------+----------+-------------------+ POP      Full           Yes      Yes                                      +---------+---------------+---------+-----------+----------+-------------------+ PTV      Full                                         Not well visualized +---------+---------------+---------+-----------+----------+-------------------+ PERO     Full                                         Not well visualized +---------+---------------+---------+-----------+----------+-------------------+     Summary: BILATERAL: - No evidence of deep vein thrombosis seen in the lower extremities, bilaterally. -No evidence of popliteal cyst, bilaterally.   *See table(s) above for measurements and observations. Electronically signed by Norman Serve on 08/18/2024 at 6:51:02 PM.    Final    ECHOCARDIOGRAM COMPLETE Result Date: 08/18/2024    ECHOCARDIOGRAM REPORT   Patient Name:   [facility] Date of Exam: 08/18/2024 Medical Rec #:  [id number]           Height:       61.0 in Accession #:    [id number]          Weight:       176.4 lb Date of Birth:  1945-08-18           BSA:          1.790 m Patient Age:    78 years            BP:           115/76 mmHg Patient Gender: F                    HR:           79 bpm. Exam Location:  Inpatient Procedure: 2D Echo (Both Spectral and Color Flow Doppler were utilized during            procedure). Indications:    congestive heart failure  History:  Patient has prior history of Echocardiogram examinations, most                 recent 04/09/2023. Cardiomyopathy, CAD, COPD and chronic kidney                 disease; Risk Factors:Diabetes, Hypertension, Dyslipidemia and                 Former Smoker.  Sonographer:    Tinnie Barefoot RDCS Referring Phys: [phone] STEVEN J NEWTON IMPRESSIONS  1. Left ventricular ejection fraction, by estimation, is 30 to 35%. Left ventricular ejection fraction by 2D MOD biplane is 35.8 %. The left ventricle has moderately decreased function. The left ventricle has no regional wall motion abnormalities. The left ventricular internal cavity size was severely dilated. There is severe eccentric left ventricular hypertrophy. Left ventricular diastolic parameters are consistent with Grade II diastolic dysfunction (pseudonormalization). Elevated left atrial pressure.  2. Right ventricular systolic function is normal. The right ventricular size is normal. There is moderately elevated pulmonary artery systolic pressure. The estimated right ventricular systolic pressure is 47.7 mmHg.  3. Left atrial size was severely dilated.  4. The mitral valve is normal in structure. Mild mitral valve regurgitation. No evidence of mitral stenosis.  5. The aortic valve is calcified. Aortic valve regurgitation is not visualized. Aortic valve sclerosis/calcification is present, without any evidence of aortic stenosis.  6. The inferior vena cava is dilated in size with <50% respiratory variability, suggesting right atrial pressure of 15 mmHg. Comparison(s): Prior images reviewed side by side. Changes from prior study are noted. EF has improved slightly, but wall motion abnormalities persist. FINDINGS  Left Ventricle: Left ventricular ejection  fraction, by estimation, is 30 to 35%. Left ventricular ejection fraction by 2D MOD biplane is 35.8 %. The left ventricle has moderately decreased function. The left ventricle has no regional wall motion abnormalities. Definity  contrast agent was given IV to delineate the left ventricular endocardial borders. The left ventricular internal cavity size was severely dilated. There is severe eccentric left ventricular hypertrophy. Left ventricular diastolic parameters are consistent with Grade II diastolic dysfunction (pseudonormalization). Elevated left atrial pressure.  LV Wall Scoring: The mid and distal anterior septum, mid and distal inferior wall, mid inferoseptal segment, apical anterior segment, and apex are akinetic. The anterior wall, mid and distal lateral wall, posterior wall, basal anteroseptal segment, mid anterolateral segment, and basal inferior segment are hypokinetic. The basal anterolateral segment and basal inferoseptal segment are normal. Right Ventricle: The right ventricular size is normal. No increase in right ventricular wall thickness. Right ventricular systolic function is normal. There is moderately elevated pulmonary artery systolic pressure. The tricuspid regurgitant velocity is 2.86 m/s, and with an assumed right atrial pressure of 15 mmHg, the estimated right ventricular systolic pressure is 47.7 mmHg. Left Atrium: Left atrial size was severely dilated. Right Atrium: Right atrial size was normal in size. Pericardium: There is no evidence of pericardial effusion. Mitral Valve: The mitral valve is normal in structure. Mild mitral valve regurgitation. No evidence of mitral valve stenosis. Tricuspid Valve: The tricuspid valve is normal in structure. Tricuspid valve regurgitation is trivial. No evidence of tricuspid stenosis. Aortic Valve: The aortic valve is calcified. Aortic valve regurgitation is not visualized. Aortic valve sclerosis/calcification is present, without any evidence of aortic  stenosis. Pulmonic Valve: The pulmonic valve was not well visualized. Pulmonic valve regurgitation is not visualized. No evidence of pulmonic stenosis. Aorta: The aortic root is normal in size and structure. Venous:  The inferior vena cava is dilated in size with less than 50% respiratory variability, suggesting right atrial pressure of 15 mmHg. IAS/Shunts: No atrial level shunt detected by color flow Doppler.  LEFT VENTRICLE PLAX 2D                        Biplane EF (MOD) LVIDd:         5.40 cm         LV Biplane EF:   Left LVIDs:         4.80 cm                          ventricular LV PW:         1.00 cm                          ejection LV IVS:        1.10 cm                          fraction by LVOT diam:     1.70 cm                          2D MOD LV SV:         42                               biplane is LV SV Index:   23                               35.8 %. LVOT Area:     2.27 cm LV IVRT:       63 msec         Diastology                                LV e' medial:    5.33 cm/s                                LV E/e' medial:  23.8 LV Volumes (MOD)               LV e' lateral:   5.77 cm/s LV vol d, MOD    315.0 ml      LV E/e' lateral: 22.0 A2C: LV vol d, MOD    179.0 ml A4C: LV vol s, MOD    205.0 ml A2C: LV vol s, MOD    121.0 ml A4C: LV SV MOD A2C:   110.0 ml LV SV MOD A4C:   179.0 ml LV SV MOD BP:    91.1 ml RIGHT VENTRICLE             IVC RV Basal diam:  2.30 cm     IVC diam: 2.20 cm RV S prime:     12.70 cm/s TAPSE (M-mode): 1.6 cm      PULMONARY VEINS                             Diastolic Velocity: 49.50 cm/s  S/D Velocity:       1.20                             Systolic Velocity:  59.50 cm/s LEFT ATRIUM              Index        RIGHT ATRIUM           Index LA diam:        4.50 cm  2.51 cm/m   RA Area:     15.40 cm LA Vol (A2C):   109.0 ml 60.88 ml/m  RA Volume:   35.70 ml  19.94 ml/m LA Vol (A4C):   88.9 ml  49.65 ml/m LA Biplane Vol: 99.5 ml  55.57 ml/m  AORTIC VALVE  LVOT Vmax:   87.40 cm/s LVOT Vmean:  59.800 cm/s LVOT VTI:    0.185 m  AORTA Ao Root diam: 3.00 cm Ao Asc diam:  2.70 cm MITRAL VALVE                  TRICUSPID VALVE MV Area (PHT): 4.39 cm       TR Peak grad:   32.7 mmHg MV Decel Time: 173 msec       TR Vmax:        286.00 cm/s MR Peak grad:    71.6 mmHg MR Mean grad:    48.0 mmHg    SHUNTS MR Vmax:         423.00 cm/s  Systemic VTI:  0.18 m MR Vmean:        328.0 cm/s   Systemic Diam: 1.70 cm MR PISA:         0.57 cm MR PISA Eff ROA: 5 mm MR PISA Radius:  0.30 cm MV E velocity: 127.00 cm/s MV A velocity: 101.00 cm/s MV E/A ratio:  1.26 Franck Azobou Tonleu Electronically signed by Joelle Cedars Tonleu Signature Date/Time: 08/18/2024/11:04:55 AM    Final         Scheduled Meds:  sodium chloride    Intravenous Once   atorvastatin   80 mg Oral Daily   budesonide  (PULMICORT ) nebulizer solution  0.5 mg Nebulization BID   enoxaparin  (LOVENOX ) injection  30 mg Subcutaneous Q24H   escitalopram   20 mg Oral Daily   ezetimibe   10 mg Oral Daily   feeding supplement  237 mL Oral BID BM   insulin  aspart  0-9 Units Subcutaneous TID WC   levalbuterol   0.63 mg Nebulization BID   methylPREDNISolone  (SOLU-MEDROL ) injection  80 mg Intravenous Daily   nebivolol   5 mg Oral Daily   pantoprazole   40 mg Oral Daily   sodium chloride  flush  3 mL Intravenous Q12H   Continuous Infusions:          Sophie Mao, MD [facility] 08/20/2024, 6:58 AM   "

## 2024-08-20 NOTE — Plan of Care (Signed)
" °  Problem: Education: Goal: Ability to describe self-care measures that may prevent or decrease complications (Diabetes Survival Skills Education) will improve Outcome: Progressing   Problem: Coping: Goal: Ability to adjust to condition or change in health will improve Outcome: Progressing   Problem: Fluid Volume: Goal: Ability to maintain a balanced intake and output will improve Outcome: Progressing   Problem: Skin Integrity: Goal: Risk for impaired skin integrity will decrease Outcome: Progressing   Problem: Coping: Goal: Level of anxiety will decrease Outcome: Progressing   Problem: Safety: Goal: Ability to remain free from injury will improve Outcome: Progressing   Problem: Skin Integrity: Goal: Risk for impaired skin integrity will decrease Outcome: Progressing   Problem: Education: Goal: Ability to demonstrate management of disease process will improve Outcome: Progressing Goal: Ability to verbalize understanding of medication therapies will improve Outcome: Progressing   Problem: Respiratory: Goal: Ability to maintain a clear airway will improve Outcome: Progressing   "

## 2024-08-21 DIAGNOSIS — I2510 Atherosclerotic heart disease of native coronary artery without angina pectoris: Secondary | ICD-10-CM

## 2024-08-21 DIAGNOSIS — J9621 Acute and chronic respiratory failure with hypoxia: Secondary | ICD-10-CM

## 2024-08-21 DIAGNOSIS — E1169 Type 2 diabetes mellitus with other specified complication: Secondary | ICD-10-CM | POA: Diagnosis not present

## 2024-08-21 DIAGNOSIS — J9601 Acute respiratory failure with hypoxia: Secondary | ICD-10-CM | POA: Diagnosis not present

## 2024-08-21 DIAGNOSIS — I5020 Unspecified systolic (congestive) heart failure: Secondary | ICD-10-CM | POA: Diagnosis not present

## 2024-08-21 DIAGNOSIS — J449 Chronic obstructive pulmonary disease, unspecified: Secondary | ICD-10-CM | POA: Diagnosis not present

## 2024-08-21 DIAGNOSIS — N179 Acute kidney failure, unspecified: Secondary | ICD-10-CM

## 2024-08-21 DIAGNOSIS — N189 Chronic kidney disease, unspecified: Secondary | ICD-10-CM

## 2024-08-21 LAB — CBC WITH DIFFERENTIAL/PLATELET
Abs Immature Granulocytes: 0.08 10*3/uL — ABNORMAL HIGH (ref 0.00–0.07)
Basophils Absolute: 0 10*3/uL (ref 0.0–0.1)
Basophils Relative: 0 %
Eosinophils Absolute: 0 10*3/uL (ref 0.0–0.5)
Eosinophils Relative: 0 %
HCT: 28.7 % — ABNORMAL LOW (ref 36.0–46.0)
Hemoglobin: 9.6 g/dL — ABNORMAL LOW (ref 12.0–15.0)
Immature Granulocytes: 1 %
Lymphocytes Relative: 14 %
Lymphs Abs: 1.1 10*3/uL (ref 0.7–4.0)
MCH: 31.8 pg (ref 26.0–34.0)
MCHC: 33.4 g/dL (ref 30.0–36.0)
MCV: 95 fL (ref 80.0–100.0)
Monocytes Absolute: 0.6 10*3/uL (ref 0.1–1.0)
Monocytes Relative: 8 %
Neutro Abs: 5.9 10*3/uL (ref 1.7–7.7)
Neutrophils Relative %: 77 %
Platelets: 148 10*3/uL — ABNORMAL LOW (ref 150–400)
RBC: 3.02 MIL/uL — ABNORMAL LOW (ref 3.87–5.11)
RDW: 12.5 % (ref 11.5–15.5)
WBC: 7.6 10*3/uL (ref 4.0–10.5)
nRBC: 0 % (ref 0.0–0.2)

## 2024-08-21 LAB — GLUCOSE, CAPILLARY
Glucose-Capillary: 101 mg/dL — ABNORMAL HIGH (ref 70–99)
Glucose-Capillary: 106 mg/dL — ABNORMAL HIGH (ref 70–99)
Glucose-Capillary: 190 mg/dL — ABNORMAL HIGH (ref 70–99)
Glucose-Capillary: 187 mg/dL — ABNORMAL HIGH (ref 70–99)

## 2024-08-21 LAB — BASIC METABOLIC PANEL WITH GFR
Anion gap: 6 (ref 5–15)
BUN: 55 mg/dL — ABNORMAL HIGH (ref 8–23)
CO2: 35 mmol/L — ABNORMAL HIGH (ref 22–32)
Calcium: 8.6 mg/dL — ABNORMAL LOW (ref 8.9–10.3)
Chloride: 99 mmol/L (ref 98–111)
Creatinine, Ser: 1.89 mg/dL — ABNORMAL HIGH (ref 0.44–1.00)
GFR, Estimated: 27 mL/min — ABNORMAL LOW
Glucose, Bld: 125 mg/dL — ABNORMAL HIGH (ref 70–99)
Potassium: 4.8 mmol/L (ref 3.5–5.1)
Sodium: 140 mmol/L (ref 135–145)

## 2024-08-21 LAB — MAGNESIUM: Magnesium: 2 mg/dL (ref 1.7–2.4)

## 2024-08-21 MED ORDER — FUROSEMIDE 40 MG PO TABS
40.0000 mg | ORAL_TABLET | Freq: Every day | ORAL | Status: DC
  Administered 2024-08-21 – 2024-08-22 (×2): 40 mg via ORAL
  Filled 2024-08-21 (×2): qty 1

## 2024-08-21 MED ORDER — PREDNISONE 20 MG PO TABS
40.0000 mg | ORAL_TABLET | Freq: Every day | ORAL | Status: DC
  Administered 2024-08-22: 40 mg via ORAL
  Filled 2024-08-21: qty 2

## 2024-08-21 MED ORDER — FUROSEMIDE 40 MG PO TABS: 40.0000 mg | ORAL_TABLET | Freq: Every day | ORAL | Status: DC

## 2024-08-21 NOTE — Progress Notes (Signed)
" ° °  Palliative Medicine Inpatient Follow Up Note HPI:  Beverly Wheeler. Beverly Wheeler is a 79 year old female with a past medical history significant for hypoxic respiratory failure on 2 L nasal cannula, COPD, coronary artery disease, congestive heart failure, chronic kidney disease stage IIIa, type 2 diabetes mellitus, hypertension, hyperlipidemia, iron  deficiency anemia.  Beverly Wheeler was admitted to the hospital with shortness of breath.  Beverly Wheeler is being treated for heart failure exacerbation as well as COPD exacerbation.  Palliative care has been asked to support additional goals of care conversations in the setting of chronic disease burden.   Today's Discussion 08/21/2024  I reviewed the chart notes including nursing notes from Dr. Drusilla, RN Alberteen Mon, Verdel Shams, and [place] Shook - PT. I also reviewed vital signs which has been stable, nursing flowsheets - oral intake 50%, medication administrations record, labs from today --> BMP - Cr downtrending to 1.89; CBC Hgb 9.5/Hct 28.7. no imaging within the last 24 hours.  I met with Beverly Wheeler at bedside this morning.Beverly Wheeler is awake and alert. Denies pain, shortness of breath, or nausea. Patient states that Beverly Wheeler is hopeful to get home in the near future, we reviewed the reasons which Beverly Wheeler has remained hospitalized.   Plan for transition home once Cr stabilizes. Patients son, Beverly Wheeler will be primary caregiver.   Questions and concerns addressed/Palliative Support Provided.   Objective Assessment: Vital Signs Vitals:   08/21/24 0336 08/21/24 1200  BP: (!) 121/45 (!) 125/50  Pulse: 60   Resp: 14   Temp: 98.4 F (36.9 C) 98 F (36.7 C)  SpO2: 100%     Intake/Output Summary (Last 24 hours) at 08/21/2024 1655 Last data filed at 08/21/2024 9662 Gross per 24 hour  Intake 594.2 ml  Output 300 ml  Net 294.2 ml   Last Weight  Most recent update: 08/21/2024  3:37 AM    Weight  76.1 kg (167 lb 12.3 oz)            Gen:  Elderly Caucasian F chronically ill in  appearance HEENT: moist mucous membranes CV: Regular rate and rhythm  PULM:  On 2LPM [place], breathing is nonlabored ABD: soft/nontender  EXT: RLE edema more pronounced than LLE Neuro: Alert and oriented x3  SUMMARY OF RECOMMENDATIONS   DNAR/DNI  MOST / DNR Form Completed, paper copy placed onto the chart electric copy can be found in [facility]  Plan to discharge home with [facility] once medically optimized  The PMT will remain available as needed ______________________________________________________________________________________ Beverly Wheeler [place] Palliative Medicine Team Team Cell Phone: [phone] Please utilize secure chat with additional questions, if there is no response within 30 minutes please call the above phone number  I personally spent a total of 25 minutes in the care of the patient today including preparing to see the patient, getting/reviewing separately obtained history, performing a medically appropriate exam/evaluation, counseling and educating, referring and communicating with other health care professionals, documenting clinical information in the EHR, and coordinating care.     "

## 2024-08-21 NOTE — Progress Notes (Signed)
 Physical Therapy Treatment Patient Details Name: Beverly Wheeler MRN: [id number] DOB: 05/28/1946 Today's Date: 08/21/2024   History of Present Illness Pt is a 79 y.o. female admitted 1/24 for acute on chronic respiratory failure. Pt with anemia and given one unit blood. Pt with hallucinations during stay. PMH:  COPD (2L at baseline), CAD, CHF with reduced EF, CKD 3A, HTN, HLD, DM 2, iron  deficiency anemia and hospitalization from 03/20/2024 to 03/26/2024 for R distal tib-fib fx requiring ORIF    PT Comments  Pt tolerated session well, participating in gait training, ambulating room level distances with AD and minimal, to occasional moderate physical assistance for posterior loss of balance. Pt then participated in static balance training, demonstrating impaired ankle and postural sway, requiring cues for micro-movements for anticipatory postural adjustments. Discussed pt's current level of function and level of assistance needed with pt and pt's son, with pt's son feeling as though he is able to provide level of assistance pt is needing upon discharge for both mobility and ADLs. PT will continue to treat pt while she is admitted. Recommending HHPT at discharge to address remaining mobility deficits and optimize return to PLOF.    If plan is discharge home, recommend the following: A lot of help with bathing/dressing/bathroom;A lot of help with walking and/or transfers;Supervision due to cognitive status;Help with stairs or ramp for entrance;Assistance with cooking/housework;Assist for transportation   Can travel by private vehicle        Equipment Recommendations  None recommended by PT    Recommendations for Other Services       Precautions / Restrictions Precautions Precautions: Fall Recall of Precautions/Restrictions: Intact Restrictions Weight Bearing Restrictions Per Provider Order: No     Mobility  Bed Mobility Overal bed mobility: Needs Assistance Bed Mobility: Supine to Sit,  Sit to Supine     Supine to sit: Contact guard, HOB elevated Sit to supine: Supervision   General bed mobility comments: Supine to sit on R side of bed with HOB elevated, advancing BLEs towards EOB and then reaching out for railing at end of bed to come to upright. Pt completed sit to supine with HOB flat and no physical assistance. VC for sequencing; increased time to complete.    Transfers Overall transfer level: Needs assistance Equipment used: Rolling walker (2 wheels) Transfers: Sit to/from Stand, Bed to chair/wheelchair/BSC Sit to Stand: Min assist   Step pivot transfers: Min assist       General transfer comment: Pt completed STS from EOB with HHA both for coming to stand and for completion of stand step transfer to [facility] on the L with therapist anterior to patient and patient holding onto therapist's bilateral elbows. Pt attempting to sit before reaching back for arms of BSC requiring cueing from therapist to remain standing until having done so. Pt then completed multiple STS from EOB and BSC with RW and no minimal physical assistance for initial power-up. VC for sequencing, UE and LE placement. Increased time to complete.    Ambulation/Gait Ambulation/Gait assistance: Min assist Gait Distance (Feet): 8 Feet (x2 with seated rest break in between) Assistive device: Rollator (4 wheels), Rolling walker (2 wheels) Gait Pattern/deviations: Step-through pattern, Decreased stride length, Knee flexed in stance - right, Knee flexed in stance - left, Trunk flexed Gait velocity: reduced Gait velocity interpretation: <1.31 ft/sec, indicative of household ambulator   General Gait Details: Pt began ambulation with rollator, demonstrating reciprocal gait pattern with several instances of posterior LOB requiring moderate physical assistance to remain upright.  Pt then trialed ambulation with RW for improved stability with pt continuing to demonstrate several posterior LOB requiring moderate  physical assistance to remain upright.   Stairs             Wheelchair Mobility     Tilt Bed    Modified Rankin (Stroke Patients Only)       Balance Overall balance assessment: Needs assistance Sitting-balance support: No upper extremity supported, Feet supported Sitting balance-Leahy Scale: Fair Sitting balance - Comments: seated EOB   Standing balance support: Bilateral upper extremity supported, During functional activity, Reliant on assistive device for balance Standing balance-Leahy Scale: Poor Standing balance comment: Pt participated in static balance training, standing upright with BUE supported on walker for 3 minutes requiring moderate physical assistance to remain upright following posterior LOB. Pt demonstrates posterior trunk lean and then rapidly flexes trunk, over correcting, to then demonstrating posterior lean again requiring cueing from therapist for core and glute activation and for mild head and hips movement to correct.                            Communication Communication Communication: Impaired Factors Affecting Communication: Reduced clarity of speech  Cognition Arousal: Alert Behavior During Therapy: Anxious   PT - Cognitive impairments: Awareness, Memory, Attention, Initiation, Sequencing, Problem solving, Safety/Judgement                       PT - Cognition Comments: Pt repeatedly asking about vitals throughout session upon hearing telemetry alarm with therapist encouraging pt throughout the session her vitals were typical. Pt also having increased difficulty with sequencing and task initiation requiring repeated prompts and cues from therapist. Following commands: Impaired Following commands impaired: Follows one step commands with increased time    Cueing Cueing Techniques: Verbal cues, Gestural cues  Exercises General Exercises - Lower Extremity Hip Flexion/Marching: AROM, Both, 10 reps    General Comments General  comments (skin integrity, edema, etc.): SpO2 94% on 2L; HR: 59-65 bpm. Pt with limited active DF on the RLE possibly contributing to impaired APR.      Pertinent Vitals/Pain Pain Assessment Pain Assessment: No/denies pain    Home Living                          Prior Function            PT Goals (current goals can now be found in the care plan section) Acute Rehab PT Goals Patient Stated Goal: to go home PT Goal Formulation: With patient Time For Goal Achievement: 09/02/24 Potential to Achieve Goals: Fair Progress towards PT goals: Progressing toward goals    Frequency    Min 2X/week      PT Plan      Co-evaluation              AM-PAC PT 6 Clicks Mobility   Outcome Measure  Help needed turning from your back to your side while in a flat bed without using bedrails?: A Little Help needed moving from lying on your back to sitting on the side of a flat bed without using bedrails?: A Little Help needed moving to and from a bed to a chair (including a wheelchair)?: A Little Help needed standing up from a chair using your arms (e.g., wheelchair or bedside chair)?: A Little Help needed to walk in hospital room?: A Lot Help needed climbing 3-5 steps  with a railing? : Total 6 Click Score: 15    End of Session Equipment Utilized During Treatment: Gait belt;Oxygen  Activity Tolerance: Patient tolerated treatment well Patient left: in bed;with call bell/phone within reach;with family/visitor present Nurse Communication: Mobility status PT Visit Diagnosis: Muscle weakness (generalized) (M62.81);Difficulty in walking, not elsewhere classified (R26.2)     Time: 1355-1436 PT Time Calculation (min) (ACUTE ONLY): 41 min  Charges:    $Gait Training: 23-37 mins $Therapeutic Activity: 8-22 mins PT General Charges $$ ACUTE PT VISIT: 1 Visit                     Leontine Hilt DPT Acute Rehab Services [phone] Prefer contact via chat    Leontine NOVAK  Beverly Wheeler 08/21/2024, 4:34 PM

## 2024-08-21 NOTE — Plan of Care (Signed)
   Problem: Coping: Goal: Ability to adjust to condition or change in health will improve Outcome: Progressing

## 2024-08-21 NOTE — Plan of Care (Signed)
  Problem: Education: Goal: Ability to describe self-care measures that may prevent or decrease complications (Diabetes Survival Skills Education) will improve Outcome: Progressing   Problem: Coping: Goal: Ability to adjust to condition or change in health will improve Outcome: Progressing   Problem: Fluid Volume: Goal: Ability to maintain a balanced intake and output will improve Outcome: Progressing   Problem: Metabolic: Goal: Ability to maintain appropriate glucose levels will improve Outcome: Progressing   Problem: Nutritional: Goal: Maintenance of adequate nutrition will improve Outcome: Progressing   Problem: Tissue Perfusion: Goal: Adequacy of tissue perfusion will improve Outcome: Progressing

## 2024-08-21 NOTE — Progress Notes (Signed)
 [facility]  PROGRESS NOTE  Beverly Wheeler FMW:[id number] DOB: 01/01/1946 DOA: 08/17/2024 PCP: Jordan, Betty G, MD   Brief HPI:   79 y.o. female with medical history significant of chronic hypoxic respiratory failure 2 L nasal cannula, COPD, CAD, CHF with reduced EF, CKD 3A, HTN, HLD, DM 2, iron  deficiency anemia and hospitalization from 03/20/2024 to 03/26/2024 for right distal tib-fib fracture requiring ORIF presented with worsening shortness of breath.  On presentation, she was satting in the mid 90s on 3 L nasal cannula.  Creatinine of 1.71, proBNP of 5290.  Chest x-ray showed central bronchovascular crowding and cardiomegaly.  She was started on IV Lasix  and steroids.  Subsequently, Lasix  held on 08/19/2024 due to uptrending creatinine.     Assessment/Plan:   Acute on chronic respiratory failure with hypoxia COPD exacerbation Acute on chronic systolic CHF -Wears 2 L oxygen  via nasal cannula at home.  Required 3 L on presentation.  Respiratory status improving: Currently on 2 L oxygen . - Strict input and output.  Daily weights.  Continue Nebivolol .  Creatinine trending upwards: DC'd IV Lasix  on 08/19/2024 - 2D echo showed EF of 30 to 35% with grade 2 diastolic dysfunction.   -Will start low-dose Lasix  40 mg daily; follow BMP in am - Continue steroids.  Continue current nebs and oral Zithromax . -Will switch from IV Solu-Medrol  to p.o. prednisone  40 mg daily from tomorrow morning   Acute metabolic encephalopathy - Patient was lethargic on presentation.  Possibly from above.   Mental status much improved.     Multivessel CAD Positive troponins Hypertension Hyperlipidemia - No chest pain.  CABG was recommended in 2015 by cardiology but patient declined CABG.  Currently no chest pain.  Outpatient follow-up with cardiology.  Continue statin, ezetimibe , Nebivolol   - High sensitive troponins did not trend up.   Diabetes mellitus type 2 with hyperglycemia - A1c 5.4.  Continue CBGs  with SSI   AKI on CKD stage IIIb - Patient creatinine of 1.2-1.4.  Creatinine 1.71 on presentation.  Creatinine worsening to 2.20 on 08/19/2024.  Creatinine 2.12 today.  Off Lasix .  Will give gentle hydration for 12 hours today.  Monitor.   Hyperkalemia Replete   Iron  deficiency anemia - Baseline hemoglobin of 8-9.  Hemoglobin 7.1 on 08/18/2024.  Received 1 unit packed red cells.   Hemoglobin 9.6 this morning.          DVT prophylaxis:   Medications     sodium chloride    Intravenous Once   atorvastatin   80 mg Oral Daily   budesonide  (PULMICORT ) nebulizer solution  0.5 mg Nebulization BID   enoxaparin  (LOVENOX ) injection  30 mg Subcutaneous Q24H   escitalopram   20 mg Oral Daily   ezetimibe   10 mg Oral Daily   feeding supplement  237 mL Oral BID BM   insulin  aspart  0-9 Units Subcutaneous TID WC   methylPREDNISolone  (SOLU-MEDROL ) injection  80 mg Intravenous Daily   nebivolol   5 mg Oral Daily   pantoprazole   40 mg Oral Daily   sodium chloride  flush  3 mL Intravenous Q12H     Data Reviewed:   CBG:  Recent Labs  Lab 08/20/24 0848 08/20/24 1106 08/20/24 1637 08/20/24 2132 08/21/24 0551  GLUCAP 112* 100* 201* 124* 101*    SpO2: 100 % O2 Flow Rate (L/min): 2 L/min FiO2 (%): 28 %    Vitals:   08/20/24 2204 08/20/24 2333 08/21/24 0336 08/21/24 0751  BP: 129/74 (!) 134/34 (!) 121/45   Pulse:  63 60   Resp:  16 14   Temp:  98.4 F (36.9 C) 98.4 F (36.9 C)   TempSrc:  Oral Oral Oral  SpO2:  93% 100%   Weight:   76.1 kg   Height:          Data Reviewed:  Basic Metabolic Panel: Recent Labs  Lab 08/18/24 0301 08/18/24 0948 08/19/24 0119 08/20/24 0314 08/21/24 0248  NA 140 138 138 140 140  K 5.8* 5.8* 5.1 5.9* 4.8  CL 101 100 97* 99 99  CO2 34* 31 32 33* 35*  GLUCOSE 146* 123* 102* 112* 125*  BUN 26* 29* 39* 42* 55*  CREATININE 1.80* 1.91* 2.20* 2.12* 1.89*  CALCIUM  9.2 9.3 8.7* 8.8* 8.6*  MG  --  1.8 1.8 1.9 2.0    CBC: Recent Labs  Lab  08/17/24 1315 08/17/24 1323 08/18/24 0301 08/18/24 0948 08/19/24 0119 08/20/24 0314 08/21/24 0248  WBC 8.9  --  7.0 10.1 10.0 8.5 7.6  NEUTROABS 5.8  --   --  8.7* 8.0* 7.2 5.9  HGB 8.4*   < > 7.2* 7.1* 8.5* 9.5* 9.6*  HCT 26.1*   < > 22.1* 21.1* 25.0* 28.4* 28.7*  MCV 99.2  --  95.3 94.6 94.7 95.9 95.0  PLT 155  --  148* 148* 153 147* 148*   < > = values in this interval not displayed.    LFT Recent Labs  Lab 08/16/24 1614 08/17/24 1315 08/18/24 0301 08/18/24 0948  AST 8 16 27  33  ALT 5 7 8 8   ALKPHOS 124 120 101 96  BILITOT <0.2 0.3 0.2 0.3  PROT 6.3 6.7 6.1* 6.1*  ALBUMIN  3.9 3.7 3.5 3.5     Antibiotics: Anti-infectives (From admission, onward)    Start     Dose/Rate Route Frequency Ordered Stop   08/18/24 1530  azithromycin  (ZITHROMAX ) tablet 500 mg       Placed in Followed by Linked Group   500 mg Oral Daily 08/17/24 1517 08/19/24 1001   08/17/24 1530  azithromycin  (ZITHROMAX ) 500 mg in sodium chloride  0.9 % 250 mL IVPB       Placed in Followed by Linked Group   500 mg 250 mL/hr over 60 Minutes Intravenous Every 24 hours 08/17/24 1517 08/17/24 1729        CONSULTS   Code Status: DNR  Family Communication: No family at bedside     Subjective   Patient seen and examined, denies any complaints   Objective    Physical Examination:   General-appears in no acute distress Heart-S1-S2, regular, no murmur auscultated Lungs-bibasilar crackles auscultated Abdomen-soft, nontender, no organomegaly Extremities-no edema in the lower extremities Neuro-alert, oriented x3, no focal deficit noted             Aaryn Sermon S Taeler Winning   [facility] If 7PM-7AM, please contact night-coverage at [email or web address], Office  [phone]   08/21/2024, 8:07 AM  LOS: 3 days

## 2024-08-22 ENCOUNTER — Other Ambulatory Visit (HOSPITAL_COMMUNITY): Payer: Self-pay

## 2024-08-22 DIAGNOSIS — I2510 Atherosclerotic heart disease of native coronary artery without angina pectoris: Secondary | ICD-10-CM | POA: Diagnosis not present

## 2024-08-22 DIAGNOSIS — I5020 Unspecified systolic (congestive) heart failure: Secondary | ICD-10-CM | POA: Diagnosis not present

## 2024-08-22 DIAGNOSIS — J449 Chronic obstructive pulmonary disease, unspecified: Secondary | ICD-10-CM

## 2024-08-22 DIAGNOSIS — J9601 Acute respiratory failure with hypoxia: Secondary | ICD-10-CM | POA: Diagnosis not present

## 2024-08-22 LAB — BASIC METABOLIC PANEL WITH GFR
Anion gap: 11 (ref 5–15)
BUN: 62 mg/dL — ABNORMAL HIGH (ref 8–23)
CO2: 34 mmol/L — ABNORMAL HIGH (ref 22–32)
Calcium: 8.4 mg/dL — ABNORMAL LOW (ref 8.9–10.3)
Chloride: 96 mmol/L — ABNORMAL LOW (ref 98–111)
Creatinine, Ser: 2.04 mg/dL — ABNORMAL HIGH (ref 0.44–1.00)
GFR, Estimated: 25 mL/min — ABNORMAL LOW
Glucose, Bld: 121 mg/dL — ABNORMAL HIGH (ref 70–99)
Potassium: 4.7 mmol/L (ref 3.5–5.1)
Sodium: 141 mmol/L (ref 135–145)

## 2024-08-22 LAB — GLUCOSE, CAPILLARY
Glucose-Capillary: 128 mg/dL — ABNORMAL HIGH (ref 70–99)
Glucose-Capillary: 108 mg/dL — ABNORMAL HIGH (ref 70–99)

## 2024-08-22 MED ORDER — DULERA 200-5 MCG/ACT IN AERO
2.0000 | INHALATION_SPRAY | Freq: Every day | RESPIRATORY_TRACT | 0 refills | Status: AC
  Filled 2024-08-22: qty 13, 30d supply, fill #0

## 2024-08-22 MED ORDER — FUROSEMIDE 40 MG PO TABS
40.0000 mg | ORAL_TABLET | ORAL | 3 refills | Status: AC
  Filled 2024-08-22: qty 36, 84d supply, fill #0

## 2024-08-22 MED ORDER — PREDNISONE 10 MG PO TABS
ORAL_TABLET | ORAL | 0 refills | Status: AC
  Filled 2024-08-22: qty 10, 4d supply, fill #0

## 2024-08-22 MED ORDER — UMECLIDINIUM BROMIDE 62.5 MCG/ACT IN AEPB
1.0000 | INHALATION_SPRAY | Freq: Every day | RESPIRATORY_TRACT | 2 refills | Status: AC
  Filled 2024-08-22: qty 30, 30d supply, fill #0

## 2024-08-22 NOTE — Plan of Care (Signed)

## 2024-08-22 NOTE — Progress Notes (Signed)
 Reviewed AVS, patient expressed understanding of medications, MD follow up reviewed.   Removed IV, Site clean, dry and intact.  Patient states all belongings brought to the hospital at time of admission are accounted for and packed to take home.  Picked up medications from [facility] pharmacy. Patient not appropriate for DC Lounge, will wait for son in room.

## 2024-08-22 NOTE — Progress Notes (Signed)
 Heart Failure Nurse Navigator Progress Note  PCP: Jordan, Betty G, MD PCP-Cardiologist: Lonni Admission Diagnosis: COPD exacerbation, Congestive heart failure.  Admitted from: home via EMS  Presentation:   Beverly Wheeler presented with generalized weakness and sleeping more then normal Shortness of breath, Sats dropped to 70's with exertion. BP 153/80, HR 80. Patient stated in the ED  that she is not more swollen than usual but feels that she may need a fluid pill. Pro BNP 5,290, Creat 1.71, Troponin 42, CBG 215. Chest x-ray showed central bronchovascular crowding and cardiomegaly. She was started on IV Lasix  and steroids. 2D echo showed EF of 30 to 35% with grade 2 diastolic dysfunction.   Patient whom was last seen in the HF [facility] clinic 06/05/2023, was re-educated on the sign and symptoms of heart failure, daily weights, when to call his doctor or go tot he ED. Diet/ fluid restrictions, taking all medications as prescribed and attending all medical appointments. Patient verbalized his understanding of all education. A HF TOC follow up appointment was scheduled for 08/30/2024 @ 11 :15 am.   ECHO/ LVEF: 30-35%  Clinical Course:  Past Medical History:  Diagnosis Date   Allergy    Asthma    CAD (coronary artery disease)    CHF (congestive heart failure) (HCC)    COPD (chronic obstructive pulmonary disease) (HCC)    Depression    Diabetes mellitus without complication (HCC)    GERD (gastroesophageal reflux disease)    Hyperlipidemia    Hypertension    Myocardial infarction (HCC) 10/16/2013     Social History   Socioeconomic History   Marital status: Widowed    Spouse name: Not on file   Number of children: 1   Years of education: Not on file   Highest education level: High school graduate  Occupational History   Occupation: retired  Tobacco Use   Smoking status: Former    Current packs/day: 0.00    Average packs/day: 1 pack/day for 45.0 years (45.0 ttl pk-yrs)     Types: Cigarettes    Start date: 10/16/1968    Quit date: 10/16/2013    Years since quitting: 10.8   Smokeless tobacco: Never  Vaping Use   Vaping status: Never Used  Substance and Sexual Activity   Alcohol use: Not Currently    Comment: occasional   Drug use: No   Sexual activity: Not Currently  Other Topics Concern   Not on file  Social History Narrative   Not on file   Social Drivers of Health   Tobacco Use: Medium Risk (08/17/2024)   Patient History    Smoking Tobacco Use: Former    Smokeless Tobacco Use: Never    Passive Exposure: Not on file  Financial Resource Strain: Low Risk (04/12/2023)   Overall Financial Resource Strain (CARDIA)    Difficulty of Paying Living Expenses: Not hard at all  Food Insecurity: No Food Insecurity (08/18/2024)   Epic    Worried About Radiation Protection Practitioner of Food in the Last Year: Never true    Ran Out of Food in the Last Year: Never true  Transportation Needs: No Transportation Needs (08/18/2024)   Epic    Lack of Transportation (Medical): No    Lack of Transportation (Non-Medical): No  Physical Activity: Inactive (05/02/2022)   Exercise Vital Sign    Days of Exercise per Week: 0 days    Minutes of Exercise per Session: 0 min  Stress: No Stress Concern Present (05/02/2022)   [employer] of  Occupational Health - Occupational Stress Questionnaire    Feeling of Stress : Not at all  Social Connections: Socially Isolated (08/18/2024)   Social Connection and Isolation Panel    Frequency of Communication with Friends and Family: Twice a week    Frequency of Social Gatherings with Friends and Family: Twice a week    Attends Religious Services: Never    Database Administrator or Organizations: No    Attends Banker Meetings: Never    Marital Status: Widowed  Depression (PHQ2-9): Low Risk (09/26/2023)   Depression (PHQ2-9)    PHQ-2 Score: 1  Alcohol Screen: Low Risk (04/12/2023)   Alcohol Screen    Last Alcohol Screening Score (AUDIT): 0   Housing: Low Risk (08/18/2024)   Epic    Unable to Pay for Housing in the Last Year: No    Number of Times Moved in the Last Year: 0    Homeless in the Last Year: No  Utilities: Not At Risk (08/18/2024)   Epic    Threatened with loss of utilities: No  Health Literacy: Not on file   Education Assessment and Provision:  Detailed education and instructions provided on heart failure disease management including the following:  Signs and symptoms of Heart Failure When to call the physician Importance of daily weights Low sodium diet Fluid restriction Medication management Anticipated future follow-up appointments  Patient education given on each of the above topics.  Patient acknowledges understanding via teach back method and acceptance of all instructions.  Education Materials:  Living Better With Heart Failure Booklet, HF zone tool, & Daily Weight Tracker Tool.  Patient has scale at home: Yes Patient has pill box at home: Yes    High Risk Criteria for Readmission and/or Poor Patient Outcomes: Heart failure hospital admissions (last 6 months): 1  No Show rate: 13%  Difficult social situation: No , her son lives with her.  Demonstrates medication adherence: yes Primary Language: English  Literacy level: Reading, writing, and comprehension   Barriers of Care:   Diet/ fluid restrictions Daily weights  Considerations/Referrals:   Referral made to Heart Failure Pharmacist Stewardship: yes Referral made to Heart Failure CSW/NCM TOC: NA Referral made to Heart & Vascular TOC clinic: yes, HF TOC Follow up. 08/30/2024 @ 11:15 am   Items for Follow-up on DC/TOC: Continued HF education  Diet/ fluid restrictions/ daily weights.    Stephane Haddock, BSN, Scientist, Clinical (histocompatibility And Immunogenetics) Only

## 2024-08-22 NOTE — Discharge Summary (Signed)
 " Physician Discharge Summary   Patient: Beverly Wheeler MRN: [id number] DOB: [birth year]  Admit date:     08/17/2024  Discharge date: 08/22/24  Discharge Physician: Sabas GORMAN Brod   PCP: Jordan, Betty G, MD   Recommendations at discharge:   Follow-up cardiology as outpatient Follow-up PCP as outpatient  Discharge Diagnoses: Principal Problem:   Acute respiratory failure with hypoxia (HCC) Active Problems:   Acute on chronic respiratory failure with hypoxia (HCC)   HFrEF (heart failure with reduced ejection fraction) (HCC)   CAD, multiple vessel   Diabetes mellitus (HCC)   Iron  deficiency anemia   Acute kidney injury superimposed on chronic kidney disease  Resolved Problems:   * No resolved hospital problems. *  Hospital Course: 79 y.o. female with medical history significant of chronic hypoxic respiratory failure 2 L nasal cannula, COPD, CAD, CHF with reduced EF, CKD 3A, HTN, HLD, DM 2, iron  deficiency anemia and hospitalization from 03/20/2024 to 03/26/2024 for right distal tib-fib fracture requiring ORIF presented with worsening shortness of breath.  On presentation, she was satting in the mid 90s on 3 L nasal cannula.  Creatinine of 1.71, proBNP of 5290.  Chest x-ray showed central bronchovascular crowding and cardiomegaly.  She was started on IV Lasix  and steroids.  Subsequently, Lasix  held on 08/19/2024 due to uptrending creatinine.   Assessment and Plan:  Acute on chronic respiratory failure with hypoxia COPD exacerbation Acute on chronic systolic CHF -Wears 2 L oxygen  via nasal cannula at home.  Required 3 L on presentation.  Respiratory status improving: Currently on 2 L oxygen . - Strict input and output.  Daily weights.  Continue Nebivolol .  Creatinine trending upwards: DC'd IV Lasix  on 08/19/2024 - 2D echo showed EF of 30 to 35% with grade 2 diastolic dysfunction.   --Continue Lasix  40 mg 3 times a week -Will discharge on prednisone  taper    Acute metabolic  encephalopathy - Patient was lethargic on presentation.  Possibly from above.   Mental status much improved.     Multivessel CAD Positive troponins Hypertension Hyperlipidemia - No chest pain.  CABG was recommended in 2015 by cardiology but patient declined CABG.  Currently no chest pain.  Outpatient follow-up with cardiology.  Continue statin, ezetimibe , Nebivolol   - High sensitive troponins did not trend up. -Follow-up cardiology as outpatient   Diabetes mellitus type 2 with hyperglycemia - A1c 5.4.    AKI on CKD stage IIIb - Patient creatinine of 1.2-1.4.   - Creatinine went up to 2.0 -Likely new baseline -Will discharge on Aldactone  daily and Lasix  40 mg 3 times a week -Hold ACE inhibitor at this time   Hyperkalemia Replete   Iron  deficiency anemia - Baseline hemoglobin of 8-9.  Hemoglobin 7.1 on 08/18/2024.  Received 1 unit packed red cells.   Hemoglobin 9.6                Consultants:  Procedures performed:  Disposition: Home Diet recommendation:  Regular diet DISCHARGE MEDICATION: Allergies as of 08/22/2024   No Known Allergies      Medication List     STOP taking these medications    losartan  25 MG tablet Commonly known as: COZAAR    montelukast  10 MG tablet Commonly known as: SINGULAIR    rivaroxaban  10 MG Tabs tablet Commonly known as: XARELTO        TAKE these medications    Accu-Chek Guide Test test strip Generic drug: glucose blood USE TO CHECK BLOOD SUGARS 4 TIMES DAILY   Accu-Chek Softclix  Lancets lancets Use to check blood sugars 1-2 times daily. Dx:e11.9   albuterol  108 (90 Base) MCG/ACT inhaler Commonly known as: VENTOLIN  HFA INHALE 1 PUFF EVERY 4 HOURS AS NEEDED FOR WHEEZING OR SHORTNESS OF BREATH.   atorvastatin  80 MG tablet Commonly known as: LIPITOR  TAKE 1 TABLET BY MOUTH EVERY DAY AT 6PM   cetirizine 10 MG tablet Commonly known as: ZYRTEC Take 10 mg by mouth daily.   cyanocobalamin  1000 MCG tablet Take 1 tablet  (1,000 mcg total) by mouth daily.   dapagliflozin  propanediol 10 MG Tabs tablet Commonly known as: FARXIGA  Take 1 tablet (10 mg total) by mouth daily.   Dulera  200-5 MCG/ACT Aero Generic drug: mometasone -formoterol  Inhale 2 puffs into the lungs daily.   escitalopram  20 MG tablet Commonly known as: LEXAPRO  TAKE 1 TABLET BY MOUTH EVERY DAY   ezetimibe  10 MG tablet Commonly known as: ZETIA  Take 1 tablet (10 mg total) by mouth daily.   famotidine  20 MG tablet Commonly known as: Pepcid  Take 1 tablet (20 mg total) by mouth daily as needed for heartburn.   ferrous gluconate  324 MG tablet Commonly known as: FERGON TAKE 1 TABLET BY MOUTH TWICE A DAY WITH MEALS   furosemide  40 MG tablet Commonly known as: LASIX  Take 1 tablet (40 mg total) by mouth 3 (three) times a week. Start taking on: August 23, 2024   nebivolol  5 MG tablet Commonly known as: BYSTOLIC  Take 1 tablet (5 mg total) by mouth daily.   nitroGLYCERIN  0.4 MG SL tablet Commonly known as: Nitrostat  Place 1 tablet (0.4 mg total) under the tongue every 5 (five) minutes as needed for chest pain.   predniSONE  10 MG tablet Commonly known as: DELTASONE  Prednisone  40 mg po daily x 1 day then Prednisone  30 mg po daily x 1 day then Prednisone  20 mg po daily x 1 day then Prednisone  10 mg daily x 1 day then stop... Start taking on: August 23, 2024   Spiriva  Respimat 2.5 MCG/ACT Aers Generic drug: Tiotropium Bromide  Inhale 1 puff into the lungs daily. What changed:  when to take this reasons to take this   spironolactone  25 MG tablet Commonly known as: ALDACTONE  Take 0.5 tablets (12.5 mg total) by mouth daily.   traMADol  50 MG tablet Commonly known as: ULTRAM  Take 1 tablet (50 mg total) by mouth every 8 (eight) hours as needed for up to 4 doses for moderate pain (pain score 4-6).        Contact information for follow-up providers     Jordan, Betty G, MD Follow up on 08/23/2024.   Specialty: Family  Medicine Why: 1:30 for hospital follow, please arrive 15 mins early Contact information: [address] [place] [facility] [place] [address] [phone]         [facility]. Go in 8 day(s).   Specialty: Cardiology Why: Hospital follow up 08/30/2024 @ 11:15 am PLEASE bring a current medicationlist to appointment FREE valet parking, Entrance C, off [facility] entrance Contact information: [address] [place]  [address] [phone]             Contact information for after-discharge care     Home Medical Care     [facility]) .   Service: Home Health Services Why: Agency will call you to set up apt times Contact information: [address] [place]  [phone]  Discharge Exam: Filed Weights   08/20/24 0345 08/21/24 0336 08/22/24 0353  Weight: 76.3 kg 76.1 kg 76.8 kg   General-appears in no acute distress Heart-S1-S2, regular, no murmur auscultated Lungs-clear to auscultation bilaterally, no wheezing or crackles auscultated Abdomen-soft, nontender, no organomegaly Extremities-no edema in the lower extremities Neuro-alert, oriented x3, no focal deficit noted  Condition at discharge: good  The results of significant diagnostics from this hospitalization (including imaging, microbiology, ancillary and laboratory) are listed below for reference.   Imaging Studies: VAS US  LOWER EXTREMITY VENOUS (DVT) Result Date: 08/18/2024  Lower Venous DVT Study Patient Name:  CHERYLANNE ARDELEAN [facility]  Date of Exam:   08/18/2024 Medical Rec #: [id number]            Accession #:    [id number] Date of Birth: 1945-12-24            Patient Gender: F Patient Age:   79 years Exam Location:  [facility] Procedure:      VAS US  LOWER EXTREMITY VENOUS (DVT) Referring Phys: SOPHIE MAO  --------------------------------------------------------------------------------  Indications: Edema.  Limitations: Patient immobility - poor positioning of LLE. Comparison Study: Previous exam on 11/29/2017 was negative for DVT. Performing Technologist: Ezzie Potters RVT, RDMS  Examination Guidelines: A complete evaluation includes B-mode imaging, spectral Doppler, color Doppler, and power Doppler as needed of all accessible portions of each vessel. Bilateral testing is considered an integral part of a complete examination. Limited examinations for reoccurring indications may be performed as noted. The reflux portion of the exam is performed with the patient in reverse Trendelenburg.  +---------+---------------+---------+-----------+----------+--------------+ RIGHT    CompressibilityPhasicitySpontaneityPropertiesThrombus Aging +---------+---------------+---------+-----------+----------+--------------+ CFV      Full           Yes      Yes                                 +---------+---------------+---------+-----------+----------+--------------+ SFJ      Full                                                        +---------+---------------+---------+-----------+----------+--------------+ FV Prox  Full           Yes      Yes                                 +---------+---------------+---------+-----------+----------+--------------+ FV Mid   Full           Yes      Yes                                 +---------+---------------+---------+-----------+----------+--------------+ FV DistalFull           Yes      Yes                                 +---------+---------------+---------+-----------+----------+--------------+ PFV      Full                                                        +---------+---------------+---------+-----------+----------+--------------+  POP      Full           Yes      Yes                                  +---------+---------------+---------+-----------+----------+--------------+ PTV      Full                                                        +---------+---------------+---------+-----------+----------+--------------+ PERO     Full                                                        +---------+---------------+---------+-----------+----------+--------------+   +---------+---------------+---------+-----------+----------+-------------------+ LEFT     CompressibilityPhasicitySpontaneityPropertiesThrombus Aging      +---------+---------------+---------+-----------+----------+-------------------+ CFV      Full           Yes      Yes                                      +---------+---------------+---------+-----------+----------+-------------------+ SFJ      Full                                                             +---------+---------------+---------+-----------+----------+-------------------+ FV Prox  Full           Yes      Yes                                      +---------+---------------+---------+-----------+----------+-------------------+ FV Mid   Full           Yes      Yes                                      +---------+---------------+---------+-----------+----------+-------------------+ FV DistalFull           Yes      Yes                                      +---------+---------------+---------+-----------+----------+-------------------+ PFV      Full                                                             +---------+---------------+---------+-----------+----------+-------------------+ POP      Full           Yes      Yes                                      +---------+---------------+---------+-----------+----------+-------------------+  PTV      Full                                         Not well visualized +---------+---------------+---------+-----------+----------+-------------------+ PERO     Full                                          Not well visualized +---------+---------------+---------+-----------+----------+-------------------+     Summary: BILATERAL: - No evidence of deep vein thrombosis seen in the lower extremities, bilaterally. -No evidence of popliteal cyst, bilaterally.   *See table(s) above for measurements and observations. Electronically signed by Norman Serve on 08/18/2024 at 6:51:02 PM.    Final    ECHOCARDIOGRAM COMPLETE Result Date: 08/18/2024    ECHOCARDIOGRAM REPORT   Patient Name:   [facility] Date of Exam: 08/18/2024 Medical Rec #:  [id number]           Height:       61.0 in Accession #:    [id number]          Weight:       176.4 lb Date of Birth:  01-24-1946           BSA:          1.790 m Patient Age:    78 years            BP:           115/76 mmHg Patient Gender: F                   HR:           79 bpm. Exam Location:  Inpatient Procedure: 2D Echo (Both Spectral and Color Flow Doppler were utilized during            procedure). Indications:    congestive heart failure  History:        Patient has prior history of Echocardiogram examinations, most                 recent 04/09/2023. Cardiomyopathy, CAD, COPD and chronic kidney                 disease; Risk Factors:Diabetes, Hypertension, Dyslipidemia and                 Former Smoker.  Sonographer:    Tinnie Barefoot RDCS Referring Phys: [phone] STEVEN J NEWTON IMPRESSIONS  1. Left ventricular ejection fraction, by estimation, is 30 to 35%. Left ventricular ejection fraction by 2D MOD biplane is 35.8 %. The left ventricle has moderately decreased function. The left ventricle has no regional wall motion abnormalities. The left ventricular internal cavity size was severely dilated. There is severe eccentric left ventricular hypertrophy. Left ventricular diastolic parameters are consistent with Grade II diastolic dysfunction (pseudonormalization). Elevated left atrial pressure.  2. Right ventricular systolic function is normal. The  right ventricular size is normal. There is moderately elevated pulmonary artery systolic pressure. The estimated right ventricular systolic pressure is 47.7 mmHg.  3. Left atrial size was severely dilated.  4. The mitral valve is normal in structure. Mild mitral valve regurgitation. No evidence of mitral stenosis.  5. The aortic valve is calcified. Aortic valve regurgitation is not visualized. Aortic valve sclerosis/calcification is present, without any evidence of aortic stenosis.  6. The inferior vena cava is dilated in size with <50% respiratory variability, suggesting right atrial pressure of 15 mmHg. Comparison(s): Prior images reviewed side by side. Changes from prior study are noted. EF has improved slightly, but wall motion abnormalities persist. FINDINGS  Left Ventricle: Left ventricular ejection fraction, by estimation, is 30 to 35%. Left ventricular ejection fraction by 2D MOD biplane is 35.8 %. The left ventricle has moderately decreased function. The left ventricle has no regional wall motion abnormalities. Definity  contrast agent was given IV to delineate the left ventricular endocardial borders. The left ventricular internal cavity size was severely dilated. There is severe eccentric left ventricular hypertrophy. Left ventricular diastolic parameters are consistent with Grade II diastolic dysfunction (pseudonormalization). Elevated left atrial pressure.  LV Wall Scoring: The mid and distal anterior septum, mid and distal inferior wall, mid inferoseptal segment, apical anterior segment, and apex are akinetic. The anterior wall, mid and distal lateral wall, posterior wall, basal anteroseptal segment, mid anterolateral segment, and basal inferior segment are hypokinetic. The basal anterolateral segment and basal inferoseptal segment are normal. Right Ventricle: The right ventricular size is normal. No increase in right ventricular wall thickness. Right ventricular systolic function is normal. There is  moderately elevated pulmonary artery systolic pressure. The tricuspid regurgitant velocity is 2.86 m/s, and with an assumed right atrial pressure of 15 mmHg, the estimated right ventricular systolic pressure is 47.7 mmHg. Left Atrium: Left atrial size was severely dilated. Right Atrium: Right atrial size was normal in size. Pericardium: There is no evidence of pericardial effusion. Mitral Valve: The mitral valve is normal in structure. Mild mitral valve regurgitation. No evidence of mitral valve stenosis. Tricuspid Valve: The tricuspid valve is normal in structure. Tricuspid valve regurgitation is trivial. No evidence of tricuspid stenosis. Aortic Valve: The aortic valve is calcified. Aortic valve regurgitation is not visualized. Aortic valve sclerosis/calcification is present, without any evidence of aortic stenosis. Pulmonic Valve: The pulmonic valve was not well visualized. Pulmonic valve regurgitation is not visualized. No evidence of pulmonic stenosis. Aorta: The aortic root is normal in size and structure. Venous: The inferior vena cava is dilated in size with less than 50% respiratory variability, suggesting right atrial pressure of 15 mmHg. IAS/Shunts: No atrial level shunt detected by color flow Doppler.  LEFT VENTRICLE PLAX 2D                        Biplane EF (MOD) LVIDd:         5.40 cm         LV Biplane EF:   Left LVIDs:         4.80 cm                          ventricular LV PW:         1.00 cm                          ejection LV IVS:        1.10 cm                          fraction by LVOT diam:     1.70 cm                          2D MOD LV SV:  42                               biplane is LV SV Index:   23                               35.8 %. LVOT Area:     2.27 cm LV IVRT:       63 msec         Diastology                                LV e' medial:    5.33 cm/s                                LV E/e' medial:  23.8 LV Volumes (MOD)               LV e' lateral:   5.77 cm/s LV vol d, MOD     315.0 ml      LV E/e' lateral: 22.0 A2C: LV vol d, MOD    179.0 ml A4C: LV vol s, MOD    205.0 ml A2C: LV vol s, MOD    121.0 ml A4C: LV SV MOD A2C:   110.0 ml LV SV MOD A4C:   179.0 ml LV SV MOD BP:    91.1 ml RIGHT VENTRICLE             IVC RV Basal diam:  2.30 cm     IVC diam: 2.20 cm RV S prime:     12.70 cm/s TAPSE (M-mode): 1.6 cm      PULMONARY VEINS                             Diastolic Velocity: 49.50 cm/s                             S/D Velocity:       1.20                             Systolic Velocity:  59.50 cm/s LEFT ATRIUM              Index        RIGHT ATRIUM           Index LA diam:        4.50 cm  2.51 cm/m   RA Area:     15.40 cm LA Vol (A2C):   109.0 ml 60.88 ml/m  RA Volume:   35.70 ml  19.94 ml/m LA Vol (A4C):   88.9 ml  49.65 ml/m LA Biplane Vol: 99.5 ml  55.57 ml/m  AORTIC VALVE LVOT Vmax:   87.40 cm/s LVOT Vmean:  59.800 cm/s LVOT VTI:    0.185 m  AORTA Ao Root diam: 3.00 cm Ao Asc diam:  2.70 cm MITRAL VALVE                  TRICUSPID VALVE MV Area (PHT): 4.39 cm       TR Peak grad:   32.7 mmHg MV Decel Time: 173 msec  TR Vmax:        286.00 cm/s MR Peak grad:    71.6 mmHg MR Mean grad:    48.0 mmHg    SHUNTS MR Vmax:         423.00 cm/s  Systemic VTI:  0.18 m MR Vmean:        328.0 cm/s   Systemic Diam: 1.70 cm MR PISA:         0.57 cm MR PISA Eff ROA: 5 mm MR PISA Radius:  0.30 cm MV E velocity: 127.00 cm/s MV A velocity: 101.00 cm/s MV E/A ratio:  1.26 Franck Azobou Tonleu Electronically signed by Joelle Ren Ny Signature Date/Time: 08/18/2024/11:04:55 AM    Final    DG Chest Port 1 View Result Date: 08/17/2024 EXAM: 1 VIEW(S) XRAY OF THE CHEST 08/17/2024 01:41:00 PM COMPARISON: Comparison 04/08/2023. CLINICAL HISTORY: Shortness of breath. FINDINGS: LUNGS AND PLEURA: Persistent relatively low lung volumes. Linear interstitial opacities in the lung bases left greater than right slightly more conspicuous. Central bronchovascular crowding. No pleural effusion. No  pneumothorax. HEART AND MEDIASTINUM: Cardiomegaly. Calcified aorta. BONES AND SOFT TISSUES: No acute osseous abnormality. IMPRESSION: 1. Linear interstitial opacities in the lung bases, left greater than right, slightly more conspicuous, with central bronchovascular crowding. 2. Cardiomegaly. Electronically signed by: Katheleen Faes MD 08/17/2024 02:13 PM EST RP Workstation: [id number]    Microbiology: Results for orders placed or performed during the hospital encounter of 08/17/24  Resp panel by RT-PCR (RSV, Flu A&B, Covid) Anterior Nasal Swab     Status: None   Collection Time: 08/17/24  1:15 PM   Specimen: Anterior Nasal Swab  Result Value Ref Range Status   SARS Coronavirus 2 by RT PCR NEGATIVE NEGATIVE Final   Influenza A by PCR NEGATIVE NEGATIVE Final   Influenza B by PCR NEGATIVE NEGATIVE Final    Comment: (NOTE) The Xpert Xpress SARS-CoV-2/FLU/RSV plus assay is intended as an aid in the diagnosis of influenza from Nasopharyngeal swab specimens and should not be used as a sole basis for treatment. Nasal washings and aspirates are unacceptable for Xpert Xpress SARS-CoV-2/FLU/RSV testing.  Fact Sheet for Patients: bloggercourse.com  Fact Sheet for Healthcare Providers: seriousbroker.it  This test is not yet approved or cleared by the [place]  FDA and has been authorized for detection and/or diagnosis of SARS-CoV-2 by FDA under an Emergency Use Authorization (EUA). This EUA will remain in effect (meaning this test can be used) for the duration of the COVID-19 declaration under Section 564(b)(1) of the Act, 21 U.S.C. section 360bbb-3(b)(1), unless the authorization is terminated or revoked.     Resp Syncytial Virus by PCR NEGATIVE NEGATIVE Final    Comment: (NOTE) Fact Sheet for Patients: bloggercourse.com  Fact Sheet for Healthcare Providers: seriousbroker.it  This test  is not yet approved or cleared by the [place]  FDA and has been authorized for detection and/or diagnosis of SARS-CoV-2 by FDA under an Emergency Use Authorization (EUA). This EUA will remain in effect (meaning this test can be used) for the duration of the COVID-19 declaration under Section 564(b)(1) of the Act, 21 U.S.C. section 360bbb-3(b)(1), unless the authorization is terminated or revoked.  Performed at [facility], [address], [place], [place] [address]     Labs: CBC: Recent Labs  Lab 08/17/24 1315 08/17/24 1323 08/18/24 0301 08/18/24 0948 08/19/24 0119 08/20/24 0314 08/21/24 0248  WBC 8.9  --  7.0 10.1 10.0 8.5 7.6  NEUTROABS 5.8  --   --  8.7*  8.0* 7.2 5.9  HGB 8.4*   < > 7.2* 7.1* 8.5* 9.5* 9.6*  HCT 26.1*   < > 22.1* 21.1* 25.0* 28.4* 28.7*  MCV 99.2  --  95.3 94.6 94.7 95.9 95.0  PLT 155  --  148* 148* 153 147* 148*   < > = values in this interval not displayed.   Basic Metabolic Panel: Recent Labs  Lab 08/18/24 0948 08/19/24 0119 08/20/24 0314 08/21/24 0248 08/22/24 0156  NA 138 138 140 140 141  K 5.8* 5.1 5.9* 4.8 4.7  CL 100 97* 99 99 96*  CO2 31 32 33* 35* 34*  GLUCOSE 123* 102* 112* 125* 121*  BUN 29* 39* 42* 55* 62*  CREATININE 1.91* 2.20* 2.12* 1.89* 2.04*  CALCIUM  9.3 8.7* 8.8* 8.6* 8.4*  MG 1.8 1.8 1.9 2.0  --    Liver Function Tests: Recent Labs  Lab 08/16/24 1614 08/17/24 1315 08/18/24 0301 08/18/24 0948  AST 8 16 27  33  ALT 5 7 8 8   ALKPHOS 124 120 101 96  BILITOT <0.2 0.3 0.2 0.3  PROT 6.3 6.7 6.1* 6.1*  ALBUMIN  3.9 3.7 3.5 3.5   CBG: Recent Labs  Lab 08/21/24 0551 08/21/24 1202 08/21/24 1648 08/21/24 2125 08/22/24 0558  GLUCAP 101* 106* 190* 187* 128*    Discharge time spent: greater than 30 minutes.  Signed: Sabas GORMAN Brod, MD [facility] 08/22/2024 "

## 2024-08-22 NOTE — Plan of Care (Signed)
" °  Problem: Education: Goal: Ability to describe self-care measures that may prevent or decrease complications (Diabetes Survival Skills Education) will improve Outcome: Progressing   Problem: Coping: Goal: Ability to adjust to condition or change in health will improve Outcome: Progressing   Problem: Fluid Volume: Goal: Ability to maintain a balanced intake and output will improve Outcome: Progressing   Problem: Skin Integrity: Goal: Risk for impaired skin integrity will decrease Outcome: Progressing   Problem: Activity: Goal: Risk for activity intolerance will decrease Outcome: Progressing   Problem: Nutrition: Goal: Adequate nutrition will be maintained Outcome: Progressing   "

## 2024-08-22 NOTE — TOC Transition Note (Signed)
 Transition of Care [facility]) - Discharge Note   Patient Details  Name: Beverly Wheeler MRN: [id number] Date of Birth: April 10, 1946  Transition of Care [facility] CM/SW Contact:  Waddell Barnie Rama, RN Phone Number: 08/22/2024, 11:19 AM   Clinical Narrative:    For dc today, NCM notified [facility] of dc today, she has transportation.   Final next level of care: Home w Home Health Services Barriers to Discharge: Continued Medical Work up   Patient Goals and CMS Choice Patient states their goals for this hospitalization and ongoing recovery are:: return home with son CMS Medicare.gov Compare Post Acute Care list provided to:: Patient Choice offered to / list presented to : Patient      Discharge Placement                       Discharge Plan and Services Additional resources added to the After Visit Summary for     Discharge Planning Services: CM Consult Post Acute Care Choice: Home Health          DME Arranged: N/A DME Agency: NA       HH Arranged: PT HH Agency: [facility] Date HH Agency Contacted: 08/19/24 Time HH Agency Contacted: 1002 Representative spoke with at [facility] Agency: [place]  Social Drivers of Health (SDOH) Interventions SDOH Screenings   Food Insecurity: No Food Insecurity (08/18/2024)  Housing: Low Risk (08/18/2024)  Transportation Needs: No Transportation Needs (08/18/2024)  Utilities: Not At Risk (08/18/2024)  Alcohol Screen: Low Risk (08/22/2024)  Depression (PHQ2-9): Low Risk (09/26/2023)  Financial Resource Strain: Low Risk (08/22/2024)  Physical Activity: Inactive (05/02/2022)  Social Connections: Socially Isolated (08/18/2024)  Stress: No Stress Concern Present (05/02/2022)  Tobacco Use: Medium Risk (08/17/2024)     Readmission Risk Interventions    08/19/2024    9:57 AM  Readmission Risk Prevention Plan  Transportation Screening Complete  PCP or Specialist Appt within 3-5 Days Complete  HRI or Home Care Consult Complete   Palliative Care Screening Complete  Medication Review (RN Care Manager) Complete

## 2024-08-22 NOTE — Progress Notes (Signed)
 Occupational Therapy Treatment Patient Details Name: Beverly Wheeler MRN: [id number] DOB: Nov 14, 1945 Today's Date: 08/22/2024   History of present illness Pt is a 79 y.o. female admitted 1/24 for acute on chronic respiratory failure. Pt with anemia and given one unit blood. Pt with hallucinations during stay. PMH:  COPD (2L at baseline), CAD, CHF with reduced EF, CKD 3A, HTN, HLD, DM 2, iron  deficiency anemia and hospitalization from 03/20/2024 to 03/26/2024 for R distal tib-fib fx requiring ORIF   OT comments  Patient demonstrating good gains with OT treatment with bed mobility, transfers, and self care. Discharge plans continue to be appropriate for home with HHOT to follow.       If plan is discharge home, recommend the following:  A little help with walking and/or transfers;A lot of help with bathing/dressing/bathroom;Assistance with cooking/housework;Direct supervision/assist for medications management;Direct supervision/assist for financial management;Assist for transportation;Help with stairs or ramp for entrance;Supervision due to cognitive status   Equipment Recommendations  None recommended by OT    Recommendations for Other Services      Precautions / Restrictions Precautions Precautions: Fall Recall of Precautions/Restrictions: Intact Restrictions Weight Bearing Restrictions Per Provider Order: No       Mobility Bed Mobility Overal bed mobility: Needs Assistance Bed Mobility: Supine to Sit, Sit to Supine     Supine to sit: Contact guard, HOB elevated Sit to supine: Contact guard assist   General bed mobility comments: CGA with trunk for bed mobiltiy    Transfers Overall transfer level: Needs assistance Equipment used: Rolling walker (2 wheels) Transfers: Sit to/from Stand, Bed to chair/wheelchair/BSC Sit to Stand: Min assist     Step pivot transfers: Min assist     General transfer comment: tranfers to BSC and back to EOB with min assist     Balance  Overall balance assessment: Needs assistance Sitting-balance support: No upper extremity supported, Feet supported Sitting balance-Leahy Scale: Fair Sitting balance - Comments: seated EOB   Standing balance support: Bilateral upper extremity supported, During functional activity, Reliant on assistive device for balance Standing balance-Leahy Scale: Poor Standing balance comment: reliant on BUE support when standing                           ADL either performed or assessed with clinical judgement   ADL Overall ADL's : Needs assistance/impaired     Grooming: Wash/dry hands;Wash/dry face;Set up;Sitting   Upper Body Bathing: Minimal assistance;Sitting   Lower Body Bathing: Moderate assistance;Sitting/lateral leans   Upper Body Dressing : Minimal assistance;Sitting       Toilet Transfer: Minimal assistance;BSC/3in1;Rolling walker (2 wheels) Toilet Transfer Details (indicate cue type and reason): min assist to sit to stand and for transfer to [facility] Toileting- Clothing Manipulation and Hygiene: Moderate assistance;Sit to/from stand;Cueing for compensatory techniques Toileting - Clothing Manipulation Details (indicate cue type and reason): unable to get self completely cleaned seated and required assistance to complete while standing            Extremity/Trunk Assessment              Vision       Perception     Praxis     Communication Communication Communication: Impaired Factors Affecting Communication: Reduced clarity of speech   Cognition Arousal: Alert Behavior During Therapy: Anxious Cognition: History of cognitive impairments  Following commands: Impaired Following commands impaired: Follows one step commands with increased time      Cueing   Cueing Techniques: Verbal cues, Gestural cues  Exercises      Shoulder Instructions       General Comments SpO2 95% on 2 liters, BP 104/56 (65) EOB     Pertinent Vitals/ Pain       Pain Assessment Pain Assessment: No/denies pain  Home Living Family/patient expects to be discharged to:: Private residence Living Arrangements: Children                                      Prior Functioning/Environment              Frequency  Min 2X/week        Progress Toward Goals  OT Goals(current goals can now be found in the care plan section)  Progress towards OT goals: Progressing toward goals  Acute Rehab OT Goals Patient Stated Goal: to go home OT Goal Formulation: With patient Time For Goal Achievement: 09/02/24 Potential to Achieve Goals: Fair ADL Goals Pt Will Transfer to Toilet: with contact guard assist;stand pivot transfer Pt Will Perform Toileting - Clothing Manipulation and hygiene: with min assist;sit to/from stand Additional ADL Goal #1: Pt will state 3 things she can do at home to save energy during adls so she is not so fatigued at night without cues.  Plan      Co-evaluation                 AM-PAC OT 6 Clicks Daily Activity     Outcome Measure   Help from another person eating meals?: A Little Help from another person taking care of personal grooming?: A Little Help from another person toileting, which includes using toliet, bedpan, or urinal?: A Lot Help from another person bathing (including washing, rinsing, drying)?: A Lot Help from another person to put on and taking off regular upper body clothing?: A Little Help from another person to put on and taking off regular lower body clothing?: A Lot 6 Click Score: 15    End of Session Equipment Utilized During Treatment: Gait belt;Rolling walker (2 wheels);Oxygen   OT Visit Diagnosis: Unsteadiness on feet (R26.81);Other abnormalities of gait and mobility (R26.89);Muscle weakness (generalized) (M62.81)   Activity Tolerance Patient tolerated treatment well   Patient Left in bed;with bed alarm set;with call bell/phone within  reach   Nurse Communication Mobility status        Time: 8960-8892 OT Time Calculation (min): 28 min  Charges: OT General Charges $OT Visit: 1 Visit OT Treatments $Self Care/Home Management : 23-37 mins  Dick Laine, OTA Acute Rehabilitation Services  Office [phone]   Jeb LITTIE Laine 08/22/2024, 1:10 PM

## 2024-08-23 ENCOUNTER — Inpatient Hospital Stay: Admitting: Family Medicine

## 2024-08-23 NOTE — Progress Notes (Unsigned)
 Hospitalized from 1/24 to 08/22/2024 for acute on chronic respiratory failure with hypoxia and COPD exacerbation. She was seen here in the office on 08/16/2024 for follow-up.

## 2024-08-30 ENCOUNTER — Ambulatory Visit (HOSPITAL_COMMUNITY)

## 2024-08-30 ENCOUNTER — Telehealth: Payer: Self-pay | Admitting: *Deleted

## 2024-08-30 ENCOUNTER — Telehealth: Payer: Self-pay

## 2024-08-30 NOTE — Telephone Encounter (Signed)
 Unable to LVM

## 2024-08-30 NOTE — Telephone Encounter (Signed)
 Called to give verbal orders for Physical Therapy - left verbal orders on his voicemail.

## 2024-08-30 NOTE — Telephone Encounter (Signed)
 Copied from CRM #[id number]. Topic: Clinical - Medication Question >> Aug 30, 2024  2:20 PM Berneda FALCON wrote: Reason for CRM: Patient states that she is supposed to be taking 324MG  of iron  and needs further instruction on this, please.  [phone] (home)

## 2024-08-30 NOTE — Telephone Encounter (Signed)
 LVMTRC

## 2024-08-30 NOTE — Telephone Encounter (Unsigned)
 Copied from CRM #[id number]. Topic: General - Other >> Aug 30, 2024  3:20 PM Kevelyn M wrote: Reason for CRM: Patient calling back for Veronica.  Call back # [phone]

## 2024-08-30 NOTE — Telephone Encounter (Signed)
 Copied from CRM [id number]. Topic: Clinical - Home Health Verbal Orders >> Aug 30, 2024 10:22 AM Berwyn MATSU wrote: Caller/Agency: [facility] Patrcia Rushing Number: [phone] Service Requested: Physical Therapy Frequency: 2w8 Any new concerns about the patient? No

## 2024-09-04 ENCOUNTER — Ambulatory Visit (HOSPITAL_COMMUNITY)

## 2024-09-19 ENCOUNTER — Ambulatory Visit: Admitting: Orthopedic Surgery

## 2024-10-03 ENCOUNTER — Ambulatory Visit (HOSPITAL_BASED_OUTPATIENT_CLINIC_OR_DEPARTMENT_OTHER): Admitting: Cardiology
